# Patient Record
Sex: Male | Born: 1935
Health system: Southern US, Community
[De-identification: ages and names within clinical notes are randomized; demographics above are authoritative.]

## PROBLEM LIST (undated history)

## (undated) DIAGNOSIS — K922 Gastrointestinal hemorrhage, unspecified: Secondary | ICD-10-CM

## (undated) DIAGNOSIS — F0390 Unspecified dementia without behavioral disturbance: Secondary | ICD-10-CM

## (undated) DIAGNOSIS — I10 Essential (primary) hypertension: Secondary | ICD-10-CM

## (undated) DIAGNOSIS — E7800 Pure hypercholesterolemia, unspecified: Secondary | ICD-10-CM

## (undated) DIAGNOSIS — K5790 Diverticulosis of intestine, part unspecified, without perforation or abscess without bleeding: Secondary | ICD-10-CM

## (undated) DIAGNOSIS — I4821 Permanent atrial fibrillation: Secondary | ICD-10-CM

## (undated) DIAGNOSIS — I2510 Atherosclerotic heart disease of native coronary artery without angina pectoris: Secondary | ICD-10-CM

## (undated) HISTORY — DX: Permanent atrial fibrillation: I48.21

## (undated) HISTORY — PX: APPENDECTOMY: SHX54

## (undated) HISTORY — DX: Atherosclerotic heart disease of native coronary artery without angina pectoris: I25.10

## (undated) HISTORY — PX: INSERT / REPLACE / REMOVE PACEMAKER: SUR710

## (undated) HISTORY — PX: KNEE SURGERY: SHX244

## (undated) HISTORY — PX: OTHER SURGICAL HISTORY: SHX169

## (undated) HISTORY — PX: TONSILLECTOMY: SUR1361

## (undated) HISTORY — PX: PILONIDAL CYST EXCISION: SHX744

## (undated) HISTORY — DX: Essential (primary) hypertension: I10

## (undated) HISTORY — DX: Pure hypercholesterolemia, unspecified: E78.00

## (undated) HISTORY — DX: Unspecified dementia, unspecified severity, without behavioral disturbance, psychotic disturbance, mood disturbance, and anxiety: F03.90

---

## 1983-07-03 HISTORY — PX: OTHER SURGICAL HISTORY: SHX169

## 1998-05-12 ENCOUNTER — Encounter: Payer: Self-pay | Admitting: Cardiology

## 1998-05-12 ENCOUNTER — Ambulatory Visit (HOSPITAL_COMMUNITY): Admission: RE | Admit: 1998-05-12 | Discharge: 1998-05-12 | Payer: Self-pay | Admitting: Cardiology

## 1999-01-25 ENCOUNTER — Encounter: Payer: Self-pay | Admitting: Cardiology

## 1999-01-25 ENCOUNTER — Inpatient Hospital Stay (HOSPITAL_COMMUNITY): Admission: EM | Admit: 1999-01-25 | Discharge: 1999-01-31 | Payer: Self-pay | Admitting: Emergency Medicine

## 1999-04-16 ENCOUNTER — Encounter: Payer: Self-pay | Admitting: *Deleted

## 1999-04-16 ENCOUNTER — Emergency Department (HOSPITAL_COMMUNITY): Admission: EM | Admit: 1999-04-16 | Discharge: 1999-04-16 | Payer: Self-pay | Admitting: Emergency Medicine

## 2000-02-29 ENCOUNTER — Encounter: Payer: Self-pay | Admitting: Cardiovascular Disease

## 2000-02-29 ENCOUNTER — Ambulatory Visit (HOSPITAL_COMMUNITY): Admission: RE | Admit: 2000-02-29 | Discharge: 2000-03-01 | Payer: Self-pay | Admitting: Cardiovascular Disease

## 2000-03-01 ENCOUNTER — Encounter: Payer: Self-pay | Admitting: Cardiovascular Disease

## 2000-11-25 ENCOUNTER — Encounter: Payer: Self-pay | Admitting: Geriatric Medicine

## 2000-11-25 ENCOUNTER — Encounter: Admission: RE | Admit: 2000-11-25 | Discharge: 2000-11-25 | Payer: Self-pay | Admitting: Geriatric Medicine

## 2001-04-17 ENCOUNTER — Encounter: Payer: Self-pay | Admitting: Geriatric Medicine

## 2001-04-17 ENCOUNTER — Encounter: Admission: RE | Admit: 2001-04-17 | Discharge: 2001-04-17 | Payer: Self-pay | Admitting: Geriatric Medicine

## 2001-05-26 ENCOUNTER — Encounter: Admission: RE | Admit: 2001-05-26 | Discharge: 2001-05-26 | Payer: Self-pay | Admitting: Gastroenterology

## 2001-05-26 ENCOUNTER — Encounter: Payer: Self-pay | Admitting: Gastroenterology

## 2001-06-16 ENCOUNTER — Encounter (INDEPENDENT_AMBULATORY_CARE_PROVIDER_SITE_OTHER): Payer: Self-pay | Admitting: *Deleted

## 2001-06-16 ENCOUNTER — Ambulatory Visit (HOSPITAL_COMMUNITY): Admission: RE | Admit: 2001-06-16 | Discharge: 2001-06-16 | Payer: Self-pay | Admitting: Gastroenterology

## 2001-07-29 ENCOUNTER — Ambulatory Visit (HOSPITAL_COMMUNITY): Admission: RE | Admit: 2001-07-29 | Discharge: 2001-07-29 | Payer: Self-pay | Admitting: Geriatric Medicine

## 2001-07-30 ENCOUNTER — Encounter: Admission: RE | Admit: 2001-07-30 | Discharge: 2001-07-30 | Payer: Self-pay | Admitting: *Deleted

## 2001-07-30 ENCOUNTER — Encounter (INDEPENDENT_AMBULATORY_CARE_PROVIDER_SITE_OTHER): Payer: Self-pay | Admitting: Specialist

## 2001-07-30 ENCOUNTER — Inpatient Hospital Stay (HOSPITAL_COMMUNITY): Admission: AD | Admit: 2001-07-30 | Discharge: 2001-08-22 | Payer: Self-pay | Admitting: Internal Medicine

## 2001-07-31 ENCOUNTER — Encounter: Payer: Self-pay | Admitting: Internal Medicine

## 2001-08-02 ENCOUNTER — Encounter: Payer: Self-pay | Admitting: Internal Medicine

## 2001-08-04 ENCOUNTER — Encounter: Payer: Self-pay | Admitting: Internal Medicine

## 2001-08-05 ENCOUNTER — Encounter: Payer: Self-pay | Admitting: Internal Medicine

## 2001-08-05 ENCOUNTER — Encounter: Payer: Self-pay | Admitting: Pulmonary Disease

## 2001-08-06 ENCOUNTER — Encounter: Payer: Self-pay | Admitting: Internal Medicine

## 2001-08-07 ENCOUNTER — Encounter: Payer: Self-pay | Admitting: Pulmonary Disease

## 2001-08-08 ENCOUNTER — Encounter: Payer: Self-pay | Admitting: Pulmonary Disease

## 2001-08-09 ENCOUNTER — Encounter: Payer: Self-pay | Admitting: Pulmonary Disease

## 2001-08-10 ENCOUNTER — Encounter: Payer: Self-pay | Admitting: Pulmonary Disease

## 2001-08-11 ENCOUNTER — Encounter: Payer: Self-pay | Admitting: Pulmonary Disease

## 2001-08-12 ENCOUNTER — Encounter: Payer: Self-pay | Admitting: Pulmonary Disease

## 2001-08-13 ENCOUNTER — Encounter: Payer: Self-pay | Admitting: Pulmonary Disease

## 2001-08-15 ENCOUNTER — Encounter: Payer: Self-pay | Admitting: Cardiology

## 2001-08-18 ENCOUNTER — Encounter: Payer: Self-pay | Admitting: Pulmonary Disease

## 2001-08-22 ENCOUNTER — Inpatient Hospital Stay: Admission: RE | Admit: 2001-08-22 | Discharge: 2001-08-29 | Payer: Self-pay | Admitting: Geriatric Medicine

## 2002-02-26 ENCOUNTER — Encounter: Admission: RE | Admit: 2002-02-26 | Discharge: 2002-02-26 | Payer: Self-pay | Admitting: Urology

## 2002-02-26 ENCOUNTER — Encounter: Payer: Self-pay | Admitting: Urology

## 2002-02-27 ENCOUNTER — Ambulatory Visit (HOSPITAL_BASED_OUTPATIENT_CLINIC_OR_DEPARTMENT_OTHER): Admission: RE | Admit: 2002-02-27 | Discharge: 2002-02-27 | Payer: Self-pay | Admitting: Urology

## 2002-02-27 ENCOUNTER — Encounter: Payer: Self-pay | Admitting: Urology

## 2002-03-12 ENCOUNTER — Ambulatory Visit (HOSPITAL_BASED_OUTPATIENT_CLINIC_OR_DEPARTMENT_OTHER): Admission: RE | Admit: 2002-03-12 | Discharge: 2002-03-12 | Payer: Self-pay | Admitting: Urology

## 2002-03-12 ENCOUNTER — Encounter: Payer: Self-pay | Admitting: Urology

## 2002-03-19 ENCOUNTER — Ambulatory Visit (HOSPITAL_BASED_OUTPATIENT_CLINIC_OR_DEPARTMENT_OTHER): Admission: RE | Admit: 2002-03-19 | Discharge: 2002-03-19 | Payer: Self-pay | Admitting: Urology

## 2002-03-19 ENCOUNTER — Encounter: Payer: Self-pay | Admitting: Urology

## 2002-03-26 ENCOUNTER — Encounter: Payer: Self-pay | Admitting: Urology

## 2002-03-26 ENCOUNTER — Encounter: Admission: RE | Admit: 2002-03-26 | Discharge: 2002-03-26 | Payer: Self-pay | Admitting: Urology

## 2002-04-07 ENCOUNTER — Encounter: Payer: Self-pay | Admitting: Urology

## 2002-04-07 ENCOUNTER — Encounter: Admission: RE | Admit: 2002-04-07 | Discharge: 2002-04-07 | Payer: Self-pay | Admitting: Urology

## 2002-06-08 ENCOUNTER — Encounter: Payer: Self-pay | Admitting: Urology

## 2002-06-08 ENCOUNTER — Encounter: Admission: RE | Admit: 2002-06-08 | Discharge: 2002-06-08 | Payer: Self-pay | Admitting: Urology

## 2003-06-16 ENCOUNTER — Encounter: Admission: RE | Admit: 2003-06-16 | Discharge: 2003-06-16 | Payer: Self-pay | Admitting: Cardiology

## 2003-08-12 ENCOUNTER — Encounter: Admission: RE | Admit: 2003-08-12 | Discharge: 2003-08-12 | Payer: Self-pay | Admitting: Geriatric Medicine

## 2004-01-12 ENCOUNTER — Encounter: Admission: RE | Admit: 2004-01-12 | Discharge: 2004-01-12 | Payer: Self-pay | Admitting: Geriatric Medicine

## 2004-06-02 ENCOUNTER — Emergency Department (HOSPITAL_COMMUNITY): Admission: EM | Admit: 2004-06-02 | Discharge: 2004-06-02 | Payer: Self-pay | Admitting: Emergency Medicine

## 2008-01-14 ENCOUNTER — Encounter: Admission: RE | Admit: 2008-01-14 | Discharge: 2008-01-14 | Payer: Self-pay | Admitting: Cardiology

## 2008-01-21 ENCOUNTER — Ambulatory Visit (HOSPITAL_COMMUNITY): Admission: RE | Admit: 2008-01-21 | Discharge: 2008-01-21 | Payer: Self-pay | Admitting: Cardiology

## 2008-10-25 ENCOUNTER — Encounter: Admission: RE | Admit: 2008-10-25 | Discharge: 2008-10-25 | Payer: Self-pay | Admitting: Cardiology

## 2008-11-03 ENCOUNTER — Ambulatory Visit (HOSPITAL_COMMUNITY): Admission: RE | Admit: 2008-11-03 | Discharge: 2008-11-03 | Payer: Self-pay | Admitting: Cardiology

## 2008-11-03 ENCOUNTER — Ambulatory Visit: Payer: Self-pay | Admitting: Vascular Surgery

## 2008-11-11 ENCOUNTER — Ambulatory Visit: Payer: Self-pay | Admitting: Cardiothoracic Surgery

## 2008-11-16 ENCOUNTER — Ambulatory Visit (HOSPITAL_COMMUNITY): Admission: RE | Admit: 2008-11-16 | Discharge: 2008-11-16 | Payer: Self-pay | Admitting: Cardiothoracic Surgery

## 2008-11-16 ENCOUNTER — Encounter: Payer: Self-pay | Admitting: Cardiothoracic Surgery

## 2008-11-18 ENCOUNTER — Ambulatory Visit (HOSPITAL_COMMUNITY): Admission: RE | Admit: 2008-11-18 | Discharge: 2008-11-18 | Payer: Self-pay | Admitting: Cardiothoracic Surgery

## 2008-11-18 ENCOUNTER — Encounter: Payer: Self-pay | Admitting: Cardiothoracic Surgery

## 2008-11-25 ENCOUNTER — Ambulatory Visit: Payer: Self-pay | Admitting: Cardiothoracic Surgery

## 2008-12-09 ENCOUNTER — Ambulatory Visit: Payer: Self-pay | Admitting: Cardiothoracic Surgery

## 2008-12-15 ENCOUNTER — Inpatient Hospital Stay (HOSPITAL_COMMUNITY): Admission: RE | Admit: 2008-12-15 | Discharge: 2008-12-21 | Payer: Self-pay | Admitting: Cardiothoracic Surgery

## 2008-12-15 ENCOUNTER — Ambulatory Visit: Payer: Self-pay | Admitting: Cardiothoracic Surgery

## 2009-01-07 ENCOUNTER — Ambulatory Visit: Payer: Self-pay | Admitting: Cardiothoracic Surgery

## 2009-01-07 ENCOUNTER — Encounter: Admission: RE | Admit: 2009-01-07 | Discharge: 2009-01-07 | Payer: Self-pay | Admitting: Cardiothoracic Surgery

## 2009-01-07 LAB — PACEMAKER DEVICE OBSERVATION

## 2009-02-03 ENCOUNTER — Encounter (HOSPITAL_COMMUNITY): Admission: RE | Admit: 2009-02-03 | Discharge: 2009-05-04 | Payer: Self-pay | Admitting: Cardiology

## 2009-04-27 ENCOUNTER — Encounter: Admission: RE | Admit: 2009-04-27 | Discharge: 2009-04-27 | Payer: Self-pay | Admitting: Geriatric Medicine

## 2009-05-05 ENCOUNTER — Encounter (HOSPITAL_COMMUNITY): Admission: RE | Admit: 2009-05-05 | Discharge: 2009-06-29 | Payer: Self-pay | Admitting: Cardiology

## 2009-11-21 ENCOUNTER — Inpatient Hospital Stay (HOSPITAL_COMMUNITY): Admission: EM | Admit: 2009-11-21 | Discharge: 2009-11-28 | Payer: Self-pay | Admitting: Emergency Medicine

## 2009-11-23 ENCOUNTER — Encounter (INDEPENDENT_AMBULATORY_CARE_PROVIDER_SITE_OTHER): Payer: Self-pay | Admitting: Internal Medicine

## 2010-01-20 IMAGING — CR DG CHEST 2V
2 series · 2 of 2 positions shown · non-contrast
Comparison: Chest x-ray of 12/19/2008

CLINICAL DATA: Recent CABG, weakness, follow-up

CHEST - 2 VIEW

[w chest pa]
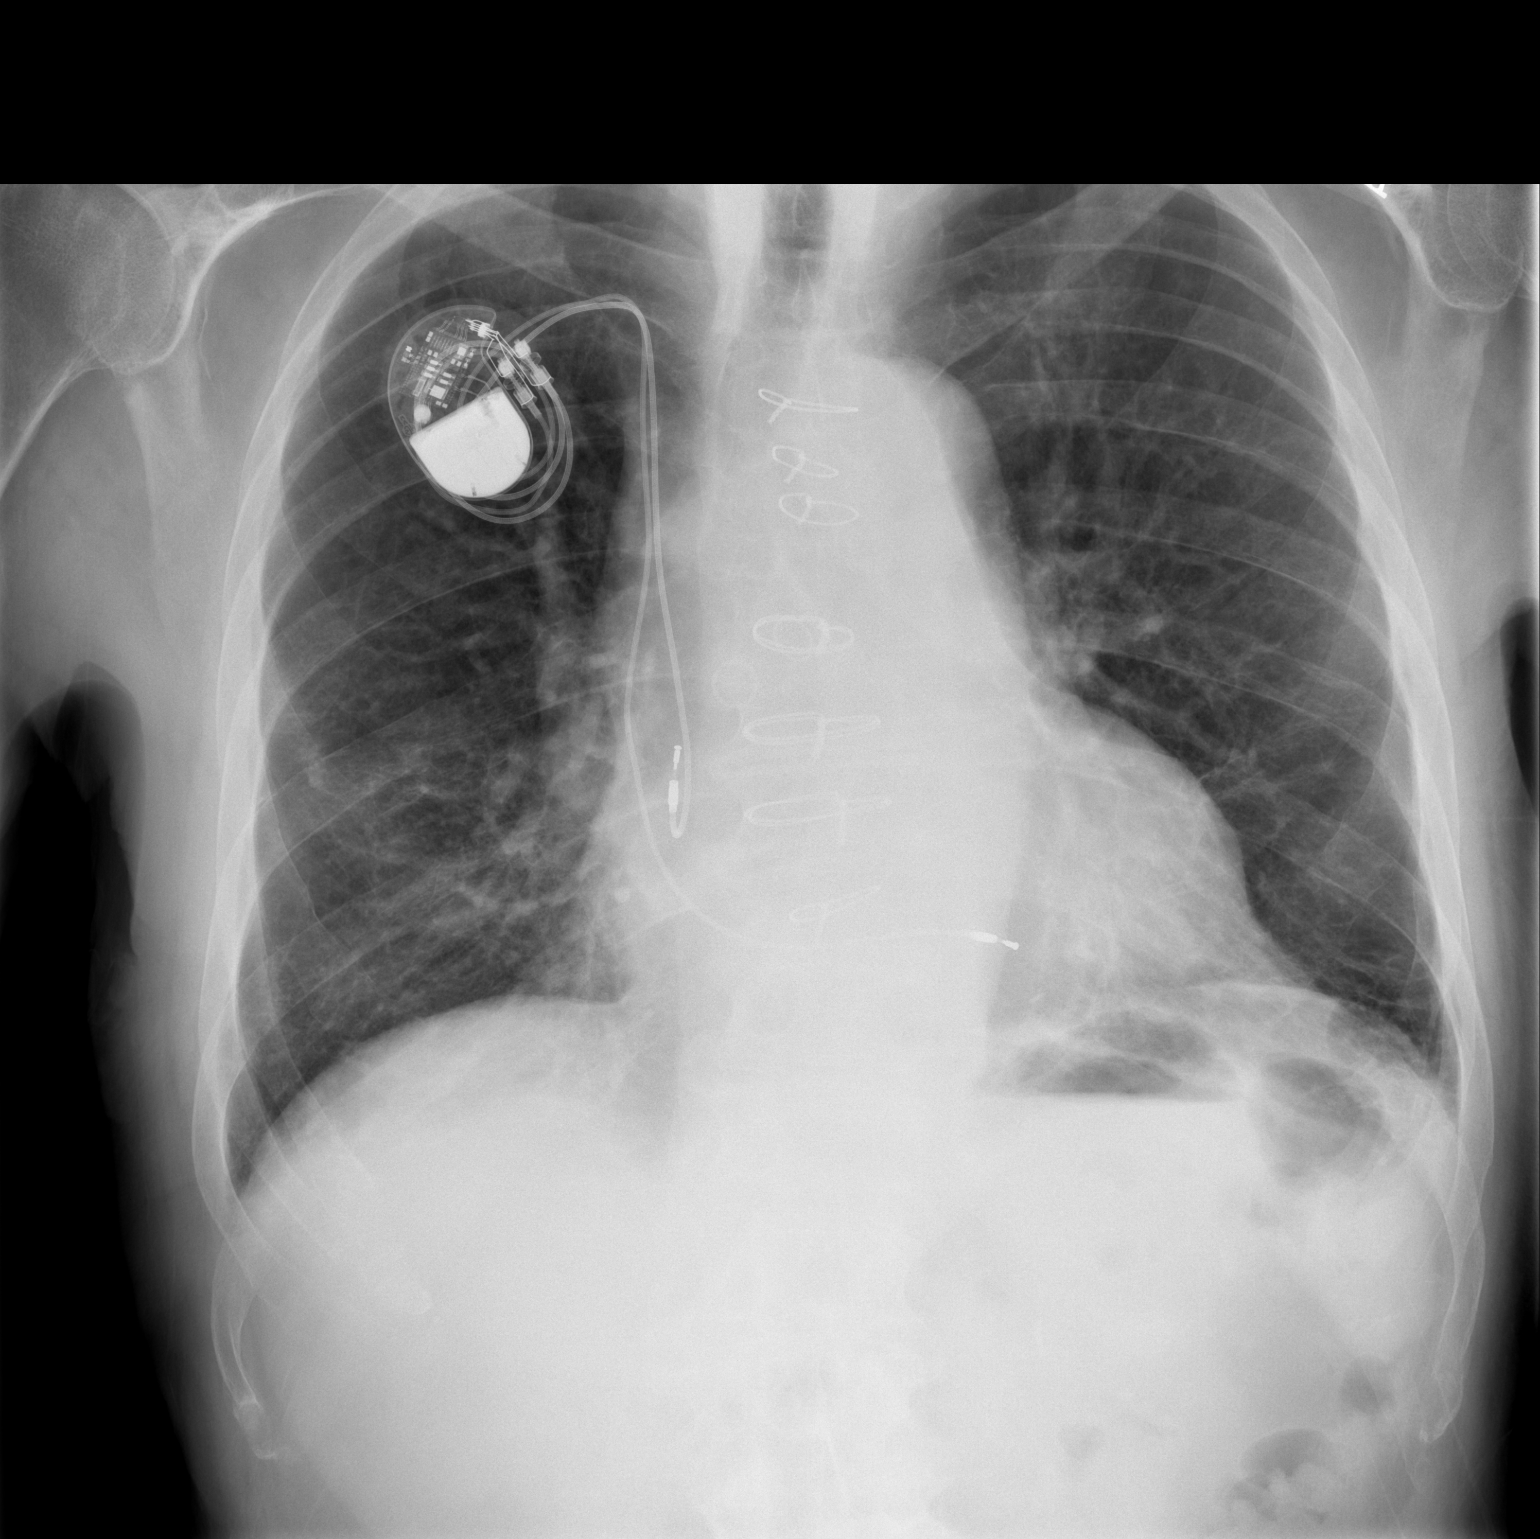

[w chest lat]
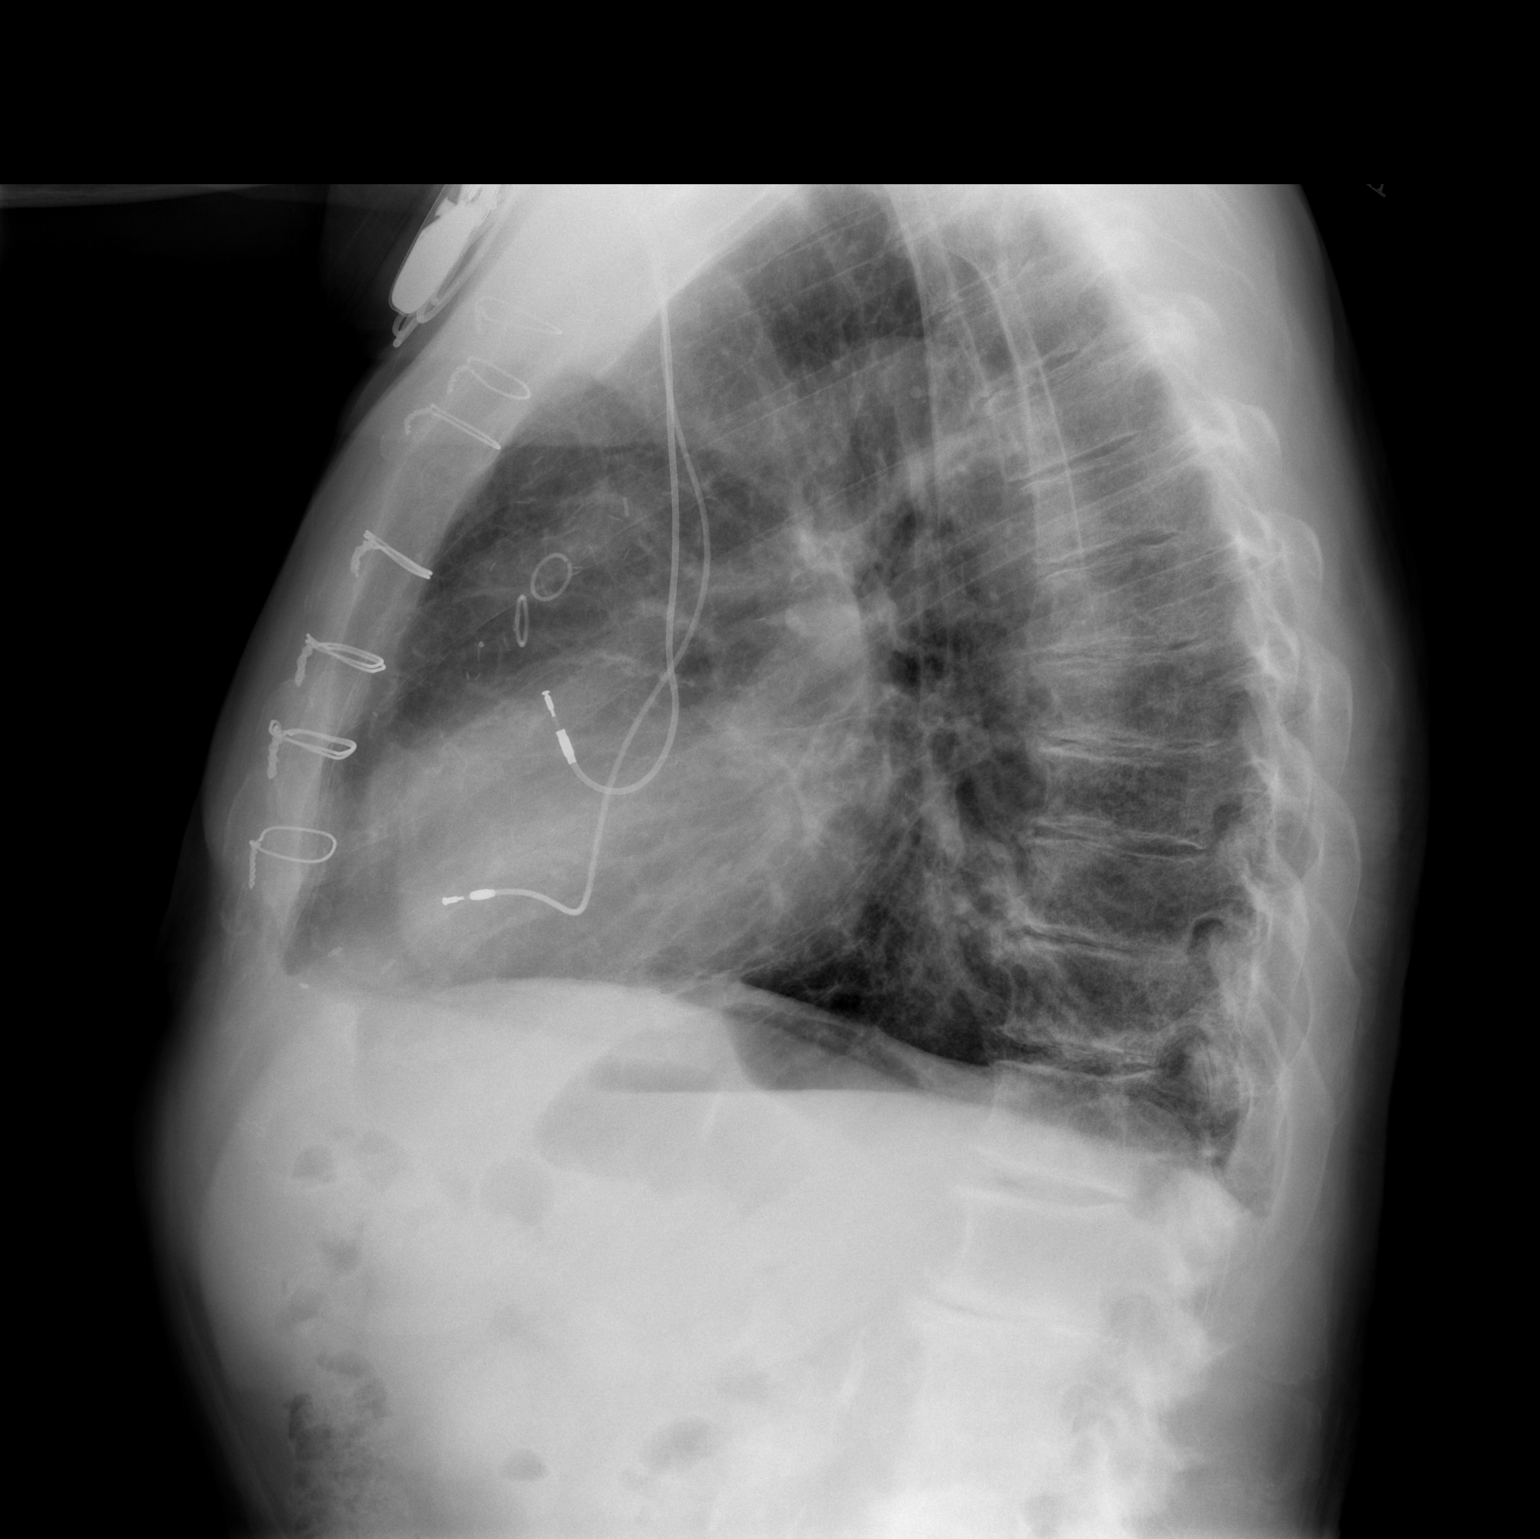

[2 of 2 positions shown; findings below may reference images not displayed]

FINDINGS: Aeration of the lung bases has improved.  The previously
noted small pleural effusions have resolved.  Mild basilar
atelectasis remains.  Cardiomegaly is stable and permanent
pacemaker is unchanged.
IMPRESSION: Improved aeration with resolution of small effusions.

## 2010-07-23 ENCOUNTER — Encounter: Payer: Self-pay | Admitting: Geriatric Medicine

## 2010-09-18 LAB — CBC
HCT: 21.1 % — ABNORMAL LOW (ref 39.0–52.0)
HCT: 22.1 % — ABNORMAL LOW (ref 39.0–52.0)
HCT: 23.4 % — ABNORMAL LOW (ref 39.0–52.0)
HCT: 25.9 % — ABNORMAL LOW (ref 39.0–52.0)
HCT: 27.5 % — ABNORMAL LOW (ref 39.0–52.0)
HCT: 27.6 % — ABNORMAL LOW (ref 39.0–52.0)
HCT: 28.4 % — ABNORMAL LOW (ref 39.0–52.0)
HCT: 28.8 % — ABNORMAL LOW (ref 39.0–52.0)
HCT: 29.3 % — ABNORMAL LOW (ref 39.0–52.0)
HCT: 29.6 % — ABNORMAL LOW (ref 39.0–52.0)
HCT: 30.4 % — ABNORMAL LOW (ref 39.0–52.0)
HCT: 31.4 % — ABNORMAL LOW (ref 39.0–52.0)
HCT: 39.4 % (ref 39.0–52.0)
Hemoglobin: 10 g/dL — ABNORMAL LOW (ref 13.0–17.0)
Hemoglobin: 10 g/dL — ABNORMAL LOW (ref 13.0–17.0)
Hemoglobin: 10.2 g/dL — ABNORMAL LOW (ref 13.0–17.0)
Hemoglobin: 10.6 g/dL — ABNORMAL LOW (ref 13.0–17.0)
Hemoglobin: 13.3 g/dL (ref 13.0–17.0)
Hemoglobin: 7.2 g/dL — ABNORMAL LOW (ref 13.0–17.0)
Hemoglobin: 8.1 g/dL — ABNORMAL LOW (ref 13.0–17.0)
Hemoglobin: 8.5 g/dL — ABNORMAL LOW (ref 13.0–17.0)
Hemoglobin: 9.3 g/dL — ABNORMAL LOW (ref 13.0–17.0)
Hemoglobin: 9.8 g/dL — ABNORMAL LOW (ref 13.0–17.0)
MCHC: 33.8 g/dL (ref 30.0–36.0)
MCHC: 33.8 g/dL (ref 30.0–36.0)
MCHC: 33.9 g/dL (ref 30.0–36.0)
MCHC: 34.2 g/dL (ref 30.0–36.0)
MCHC: 34.6 g/dL (ref 30.0–36.0)
MCV: 95.6 fL (ref 78.0–100.0)
MCV: 95.6 fL (ref 78.0–100.0)
MCV: 95.9 fL (ref 78.0–100.0)
MCV: 96.1 fL (ref 78.0–100.0)
MCV: 96.4 fL (ref 78.0–100.0)
MCV: 96.6 fL (ref 78.0–100.0)
MCV: 96.8 fL (ref 78.0–100.0)
MCV: 97 fL (ref 78.0–100.0)
MCV: 97 fL (ref 78.0–100.0)
MCV: 97.2 fL (ref 78.0–100.0)
MCV: 97.6 fL (ref 78.0–100.0)
Platelets: 121 10*3/uL — ABNORMAL LOW (ref 150–400)
Platelets: 127 10*3/uL — ABNORMAL LOW (ref 150–400)
Platelets: 132 10*3/uL — ABNORMAL LOW (ref 150–400)
Platelets: 140 10*3/uL — ABNORMAL LOW (ref 150–400)
Platelets: 142 10*3/uL — ABNORMAL LOW (ref 150–400)
Platelets: 145 10*3/uL — ABNORMAL LOW (ref 150–400)
Platelets: 146 10*3/uL — ABNORMAL LOW (ref 150–400)
Platelets: 165 10*3/uL (ref 150–400)
Platelets: 177 10*3/uL (ref 150–400)
Platelets: 179 10*3/uL (ref 150–400)
Platelets: 185 10*3/uL (ref 150–400)
Platelets: 212 10*3/uL (ref 150–400)
Platelets: 226 10*3/uL (ref 150–400)
RBC: 2.29 MIL/uL — ABNORMAL LOW (ref 4.22–5.81)
RBC: 2.62 MIL/uL — ABNORMAL LOW (ref 4.22–5.81)
RBC: 2.88 MIL/uL — ABNORMAL LOW (ref 4.22–5.81)
RBC: 2.93 MIL/uL — ABNORMAL LOW (ref 4.22–5.81)
RBC: 3.24 MIL/uL — ABNORMAL LOW (ref 4.22–5.81)
RBC: 4.06 MIL/uL — ABNORMAL LOW (ref 4.22–5.81)
RDW: 14.3 % (ref 11.5–15.5)
RDW: 14.4 % (ref 11.5–15.5)
RDW: 14.5 % (ref 11.5–15.5)
RDW: 14.6 % (ref 11.5–15.5)
RDW: 14.6 % (ref 11.5–15.5)
RDW: 14.7 % (ref 11.5–15.5)
RDW: 14.9 % (ref 11.5–15.5)
RDW: 15.1 % (ref 11.5–15.5)
WBC: 4.4 10*3/uL (ref 4.0–10.5)
WBC: 4.6 10*3/uL (ref 4.0–10.5)
WBC: 5.1 10*3/uL (ref 4.0–10.5)
WBC: 5.1 10*3/uL (ref 4.0–10.5)
WBC: 5.2 10*3/uL (ref 4.0–10.5)
WBC: 5.2 10*3/uL (ref 4.0–10.5)
WBC: 5.7 10*3/uL (ref 4.0–10.5)
WBC: 6 10*3/uL (ref 4.0–10.5)
WBC: 6 10*3/uL (ref 4.0–10.5)
WBC: 6.1 10*3/uL (ref 4.0–10.5)
WBC: 6.2 10*3/uL (ref 4.0–10.5)
WBC: 6.4 10*3/uL (ref 4.0–10.5)

## 2010-09-18 LAB — COMPREHENSIVE METABOLIC PANEL
ALT: 17 U/L (ref 0–53)
ALT: 21 U/L (ref 0–53)
AST: 32 U/L (ref 0–37)
AST: 37 U/L (ref 0–37)
Albumin: 3.6 g/dL (ref 3.5–5.2)
Alkaline Phosphatase: 68 U/L (ref 39–117)
Alkaline Phosphatase: 99 U/L (ref 39–117)
BUN: 15 mg/dL (ref 6–23)
CO2: 25 mEq/L (ref 19–32)
CO2: 27 mEq/L (ref 19–32)
Calcium: 9.2 mg/dL (ref 8.4–10.5)
Chloride: 102 mEq/L (ref 96–112)
Chloride: 109 mEq/L (ref 96–112)
Creatinine, Ser: 1.09 mg/dL (ref 0.4–1.5)
GFR calc Af Amer: >60 mL/min (ref >60)
GFR calc Af Amer: >60 mL/min (ref >60)
GFR calc non Af Amer: >60 mL/min (ref >60)
GFR calc non Af Amer: >60 mL/min (ref >60)
Glucose, Bld: 132 mg/dL — ABNORMAL HIGH (ref 70–99)
Glucose, Bld: 82 mg/dL (ref 70–99)
Potassium: 4.2 mEq/L (ref 3.5–5.1)
Potassium: 4.5 mEq/L (ref 3.5–5.1)
Sodium: 136 mEq/L (ref 135–145)
Sodium: 137 mEq/L (ref 135–145)
Total Bilirubin: 0.6 mg/dL (ref 0.3–1.2)
Total Bilirubin: 0.7 mg/dL (ref 0.3–1.2)
Total Protein: 7.5 g/dL (ref 6.0–8.3)

## 2010-09-18 LAB — BASIC METABOLIC PANEL
BUN: 12 mg/dL (ref 6–23)
Calcium: 8.3 mg/dL — ABNORMAL LOW (ref 8.4–10.5)
Creatinine, Ser: 1.15 mg/dL (ref 0.4–1.5)
GFR calc Af Amer: >60 mL/min (ref >60)
GFR calc non Af Amer: >60 mL/min (ref >60)
GFR calc non Af Amer: >60 mL/min (ref >60)
Glucose, Bld: 113 mg/dL — ABNORMAL HIGH (ref 70–99)
Glucose, Bld: 92 mg/dL (ref 70–99)
Potassium: 3.8 mEq/L (ref 3.5–5.1)
Sodium: 138 mEq/L (ref 135–145)

## 2010-09-18 LAB — CROSSMATCH
ABO/RH(D): O POS
Antibody Screen: NEGATIVE

## 2010-09-18 LAB — PREPARE FRESH FROZEN PLASMA

## 2010-09-18 LAB — DIFFERENTIAL
Basophils Absolute: 0 10*3/uL (ref 0.0–0.1)
Basophils Relative: 0 % (ref 0–1)
Eosinophils Absolute: 0.3 10*3/uL (ref 0.0–0.7)
Eosinophils Relative: 5 % (ref 0–5)
Lymphocytes Relative: 24 % (ref 12–46)
Lymphs Abs: 1.4 10*3/uL (ref 0.7–4.0)
Monocytes Absolute: 0.9 10*3/uL (ref 0.1–1.0)
Monocytes Relative: 15 % — ABNORMAL HIGH (ref 3–12)
Neutro Abs: 3.3 10*3/uL (ref 1.7–7.7)
Neutrophils Relative %: 55 % (ref 43–77)

## 2010-09-18 LAB — PROTIME-INR
INR: 1.18 (ref 0.00–1.49)
INR: 1.5 — ABNORMAL HIGH (ref 0.00–1.49)
INR: 2.43 — ABNORMAL HIGH (ref 0.00–1.49)
Prothrombin Time: 26.2 seconds — ABNORMAL HIGH (ref 11.6–15.2)

## 2010-09-18 LAB — TYPE AND SCREEN: ABO/RH(D): O POS

## 2010-09-18 LAB — PREPARE RBC (CROSSMATCH)

## 2010-09-18 LAB — URINALYSIS, ROUTINE W REFLEX MICROSCOPIC
Bilirubin Urine: NEGATIVE
Ketones, ur: NEGATIVE mg/dL
Nitrite: NEGATIVE
Protein, ur: NEGATIVE mg/dL
Specific Gravity, Urine: 1.017 (ref 1.005–1.030)
Urobilinogen, UA: 0.2 mg/dL (ref 0.0–1.0)

## 2010-09-18 LAB — CARDIAC PANEL(CRET KIN+CKTOT+MB+TROPI): Troponin I: 0.02 ng/mL (ref 0.00–0.06)

## 2010-09-18 LAB — MRSA PCR SCREENING: MRSA by PCR: NEGATIVE

## 2010-09-18 LAB — URINE MICROSCOPIC-ADD ON

## 2010-09-18 LAB — HEMOCCULT GUIAC POC 1CARD (OFFICE): Fecal Occult Bld: POSITIVE

## 2010-10-09 LAB — POCT I-STAT 3, ART BLOOD GAS (G3+)
Acid-Base Excess: 1 mmol/L (ref 0.0–2.0)
Acid-base deficit: 2 mmol/L (ref 0.0–2.0)
Acid-base deficit: 2 mmol/L (ref 0.0–2.0)
Acid-base deficit: 2 mmol/L (ref 0.0–2.0)
Acid-base deficit: 3 mmol/L — ABNORMAL HIGH (ref 0.0–2.0)
Acid-base deficit: 3 mmol/L — ABNORMAL HIGH (ref 0.0–2.0)
Bicarbonate: 22.1 mEq/L (ref 20.0–24.0)
Bicarbonate: 22.1 mEq/L (ref 20.0–24.0)
Bicarbonate: 22.9 mEq/L (ref 20.0–24.0)
Bicarbonate: 23.1 mEq/L (ref 20.0–24.0)
Bicarbonate: 23.2 mEq/L (ref 20.0–24.0)
Bicarbonate: 25.6 mEq/L — ABNORMAL HIGH (ref 20.0–24.0)
O2 Saturation: 100 %
O2 Saturation: 88 %
O2 Saturation: 90 %
O2 Saturation: 92 %
O2 Saturation: 93 %
O2 Saturation: 96 %
Patient temperature: 36.7
Patient temperature: 36.9
Patient temperature: 37.1
Patient temperature: 37.4
Patient temperature: 98.6
TCO2: 23 mmol/L (ref 0–100)
TCO2: 23 mmol/L (ref 0–100)
TCO2: 24 mmol/L (ref 0–100)
TCO2: 24 mmol/L (ref 0–100)
TCO2: 25 mmol/L (ref 0–100)
TCO2: 27 mmol/L (ref 0–100)
pCO2 arterial: 39.4 mmHg (ref 35.0–45.0)
pCO2 arterial: 39.7 mmHg (ref 35.0–45.0)
pCO2 arterial: 39.9 mmHg (ref 35.0–45.0)
pCO2 arterial: 40.6 mmHg (ref 35.0–45.0)
pCO2 arterial: 41.3 mmHg (ref 35.0–45.0)
pCO2 arterial: 42.8 mmHg (ref 35.0–45.0)
pH, Arterial: 7.338 — ABNORMAL LOW (ref 7.350–7.450)
pH, Arterial: 7.343 — ABNORMAL LOW (ref 7.350–7.450)
pH, Arterial: 7.352 (ref 7.350–7.450)
pH, Arterial: 7.371 (ref 7.350–7.450)
pH, Arterial: 7.371 (ref 7.350–7.450)
pH, Arterial: 7.408 (ref 7.350–7.450)
pO2, Arterial: 326 mmHg — ABNORMAL HIGH (ref 80.0–100.0)
pO2, Arterial: 60 mmHg — ABNORMAL LOW (ref 80.0–100.0)
pO2, Arterial: 61 mmHg — ABNORMAL LOW (ref 80.0–100.0)
pO2, Arterial: 65 mmHg — ABNORMAL LOW (ref 80.0–100.0)
pO2, Arterial: 70 mmHg — ABNORMAL LOW (ref 80.0–100.0)
pO2, Arterial: 86 mmHg (ref 80.0–100.0)

## 2010-10-09 LAB — PROTIME-INR
INR: 1.3 (ref 0.00–1.49)
INR: 1.6 — ABNORMAL HIGH (ref 0.00–1.49)
INR: 1.6 — ABNORMAL HIGH (ref 0.00–1.49)
INR: 1.6 — ABNORMAL HIGH (ref 0.00–1.49)
INR: 1.8 — ABNORMAL HIGH (ref 0.00–1.49)
INR: 1.1 (ref 0.00–1.49)
Prothrombin Time: 16.1 seconds — ABNORMAL HIGH (ref 11.6–15.2)
Prothrombin Time: 19.3 seconds — ABNORMAL HIGH (ref 11.6–15.2)
Prothrombin Time: 19.8 seconds — ABNORMAL HIGH (ref 11.6–15.2)
Prothrombin Time: 20 seconds — ABNORMAL HIGH (ref 11.6–15.2)
Prothrombin Time: 22 seconds — ABNORMAL HIGH (ref 11.6–15.2)
Prothrombin Time: 15 seconds (ref 11.6–15.2)

## 2010-10-09 LAB — CBC
HCT: 28.6 % — ABNORMAL LOW (ref 39.0–52.0)
HCT: 28.6 % — ABNORMAL LOW (ref 39.0–52.0)
HCT: 30.1 % — ABNORMAL LOW (ref 39.0–52.0)
HCT: 31.5 % — ABNORMAL LOW (ref 39.0–52.0)
HCT: 33.6 % — ABNORMAL LOW (ref 39.0–52.0)
HCT: 34.4 % — ABNORMAL LOW (ref 39.0–52.0)
HCT: 35.2 % — ABNORMAL LOW (ref 39.0–52.0)
HCT: 35.2 % — ABNORMAL LOW (ref 39.0–52.0)
HCT: 37.2 % — ABNORMAL LOW (ref 39.0–52.0)
HCT: 42.6 % (ref 39.0–52.0)
Hemoglobin: 10.2 g/dL — ABNORMAL LOW (ref 13.0–17.0)
Hemoglobin: 10.7 g/dL — ABNORMAL LOW (ref 13.0–17.0)
Hemoglobin: 11.2 g/dL — ABNORMAL LOW (ref 13.0–17.0)
Hemoglobin: 11.6 g/dL — ABNORMAL LOW (ref 13.0–17.0)
Hemoglobin: 11.6 g/dL — ABNORMAL LOW (ref 13.0–17.0)
Hemoglobin: 12 g/dL — ABNORMAL LOW (ref 13.0–17.0)
Hemoglobin: 12.4 g/dL — ABNORMAL LOW (ref 13.0–17.0)
Hemoglobin: 9.7 g/dL — ABNORMAL LOW (ref 13.0–17.0)
Hemoglobin: 9.7 g/dL — ABNORMAL LOW (ref 13.0–17.0)
Hemoglobin: 14.3 g/dL (ref 13.0–17.0)
MCHC: 32.9 g/dL (ref 30.0–36.0)
MCHC: 33.3 g/dL (ref 30.0–36.0)
MCHC: 33.4 g/dL (ref 30.0–36.0)
MCHC: 33.8 g/dL (ref 30.0–36.0)
MCHC: 33.8 g/dL (ref 30.0–36.0)
MCHC: 33.9 g/dL (ref 30.0–36.0)
MCHC: 33.9 g/dL (ref 30.0–36.0)
MCHC: 33.9 g/dL (ref 30.0–36.0)
MCHC: 34 g/dL (ref 30.0–36.0)
MCHC: 33.6 g/dL (ref 30.0–36.0)
MCV: 95.7 fL (ref 78.0–100.0)
MCV: 96.2 fL (ref 78.0–100.0)
MCV: 96.4 fL (ref 78.0–100.0)
MCV: 96.4 fL (ref 78.0–100.0)
MCV: 96.5 fL (ref 78.0–100.0)
MCV: 96.6 fL (ref 78.0–100.0)
MCV: 96.7 fL (ref 78.0–100.0)
MCV: 96.9 fL (ref 78.0–100.0)
MCV: 97 fL (ref 78.0–100.0)
MCV: 95.3 fL (ref 78.0–100.0)
Platelets: 108 10*3/uL — ABNORMAL LOW (ref 150–400)
Platelets: 112 10*3/uL — ABNORMAL LOW (ref 150–400)
Platelets: 118 10*3/uL — ABNORMAL LOW (ref 150–400)
Platelets: 119 10*3/uL — ABNORMAL LOW (ref 150–400)
Platelets: 130 10*3/uL — ABNORMAL LOW (ref 150–400)
Platelets: 134 10*3/uL — ABNORMAL LOW (ref 150–400)
Platelets: 164 10*3/uL (ref 150–400)
Platelets: 197 10*3/uL (ref 150–400)
Platelets: 91 10*3/uL — ABNORMAL LOW (ref 150–400)
Platelets: 198 10*3/uL (ref 150–400)
RBC: 2.96 MIL/uL — ABNORMAL LOW (ref 4.22–5.81)
RBC: 2.97 MIL/uL — ABNORMAL LOW (ref 4.22–5.81)
RBC: 3.12 MIL/uL — ABNORMAL LOW (ref 4.22–5.81)
RBC: 3.27 MIL/uL — ABNORMAL LOW (ref 4.22–5.81)
RBC: 3.47 MIL/uL — ABNORMAL LOW (ref 4.22–5.81)
RBC: 3.57 MIL/uL — ABNORMAL LOW (ref 4.22–5.81)
RBC: 3.63 MIL/uL — ABNORMAL LOW (ref 4.22–5.81)
RBC: 3.64 MIL/uL — ABNORMAL LOW (ref 4.22–5.81)
RBC: 3.88 MIL/uL — ABNORMAL LOW (ref 4.22–5.81)
RBC: 4.47 MIL/uL (ref 4.22–5.81)
RDW: 13.8 % (ref 11.5–15.5)
RDW: 13.9 % (ref 11.5–15.5)
RDW: 14.1 % (ref 11.5–15.5)
RDW: 14.2 % (ref 11.5–15.5)
RDW: 14.2 % (ref 11.5–15.5)
RDW: 14.2 % (ref 11.5–15.5)
RDW: 14.2 % (ref 11.5–15.5)
RDW: 14.3 % (ref 11.5–15.5)
RDW: 14.4 % (ref 11.5–15.5)
RDW: 14 % (ref 11.5–15.5)
WBC: 10.6 10*3/uL — ABNORMAL HIGH (ref 4.0–10.5)
WBC: 11.1 10*3/uL — ABNORMAL HIGH (ref 4.0–10.5)
WBC: 12.7 10*3/uL — ABNORMAL HIGH (ref 4.0–10.5)
WBC: 7.2 10*3/uL (ref 4.0–10.5)
WBC: 7.4 10*3/uL (ref 4.0–10.5)
WBC: 7.9 10*3/uL (ref 4.0–10.5)
WBC: 8.8 10*3/uL (ref 4.0–10.5)
WBC: 9 10*3/uL (ref 4.0–10.5)
WBC: 9.3 10*3/uL (ref 4.0–10.5)
WBC: 5.7 10*3/uL (ref 4.0–10.5)

## 2010-10-09 LAB — BASIC METABOLIC PANEL
BUN: 10 mg/dL (ref 6–23)
BUN: 13 mg/dL (ref 6–23)
BUN: 15 mg/dL (ref 6–23)
BUN: 17 mg/dL (ref 6–23)
BUN: 18 mg/dL (ref 6–23)
BUN: 22 mg/dL (ref 6–23)
BUN: 24 mg/dL — ABNORMAL HIGH (ref 6–23)
CO2: 23 mEq/L (ref 19–32)
CO2: 25 mEq/L (ref 19–32)
CO2: 26 mEq/L (ref 19–32)
CO2: 27 mEq/L (ref 19–32)
CO2: 29 mEq/L (ref 19–32)
CO2: 30 mEq/L (ref 19–32)
CO2: 32 mEq/L (ref 19–32)
Calcium: 7.8 mg/dL — ABNORMAL LOW (ref 8.4–10.5)
Calcium: 8.2 mg/dL — ABNORMAL LOW (ref 8.4–10.5)
Calcium: 8.3 mg/dL — ABNORMAL LOW (ref 8.4–10.5)
Calcium: 8.4 mg/dL (ref 8.4–10.5)
Calcium: 8.5 mg/dL (ref 8.4–10.5)
Calcium: 8.6 mg/dL (ref 8.4–10.5)
Calcium: 8.9 mg/dL (ref 8.4–10.5)
Chloride: 100 mEq/L (ref 96–112)
Chloride: 101 mEq/L (ref 96–112)
Chloride: 102 mEq/L (ref 96–112)
Chloride: 102 mEq/L (ref 96–112)
Chloride: 103 mEq/L (ref 96–112)
Chloride: 105 mEq/L (ref 96–112)
Chloride: 99 mEq/L (ref 96–112)
Creatinine, Ser: 0.96 mg/dL (ref 0.4–1.5)
Creatinine, Ser: 1.04 mg/dL (ref 0.4–1.5)
Creatinine, Ser: 1.08 mg/dL (ref 0.4–1.5)
Creatinine, Ser: 1.1 mg/dL (ref 0.4–1.5)
Creatinine, Ser: 1.11 mg/dL (ref 0.4–1.5)
Creatinine, Ser: 1.37 mg/dL (ref 0.4–1.5)
Creatinine, Ser: 1.41 mg/dL (ref 0.4–1.5)
GFR calc Af Amer: 58 mL/min — ABNORMAL LOW (ref >60)
GFR calc Af Amer: >60 mL/min (ref >60)
GFR calc Af Amer: >60 mL/min (ref >60)
GFR calc Af Amer: >60 mL/min (ref >60)
GFR calc Af Amer: >60 mL/min (ref >60)
GFR calc Af Amer: >60 mL/min (ref >60)
GFR calc Af Amer: >60 mL/min (ref >60)
GFR calc non Af Amer: 48 mL/min — ABNORMAL LOW (ref >60)
GFR calc non Af Amer: 50 mL/min — ABNORMAL LOW (ref >60)
GFR calc non Af Amer: >60 mL/min (ref >60)
GFR calc non Af Amer: >60 mL/min (ref >60)
GFR calc non Af Amer: >60 mL/min (ref >60)
GFR calc non Af Amer: >60 mL/min (ref >60)
GFR calc non Af Amer: >60 mL/min (ref >60)
Glucose, Bld: 101 mg/dL — ABNORMAL HIGH (ref 70–99)
Glucose, Bld: 102 mg/dL — ABNORMAL HIGH (ref 70–99)
Glucose, Bld: 126 mg/dL — ABNORMAL HIGH (ref 70–99)
Glucose, Bld: 128 mg/dL — ABNORMAL HIGH (ref 70–99)
Glucose, Bld: 160 mg/dL — ABNORMAL HIGH (ref 70–99)
Glucose, Bld: 96 mg/dL (ref 70–99)
Glucose, Bld: 98 mg/dL (ref 70–99)
Potassium: 3.7 mEq/L (ref 3.5–5.1)
Potassium: 3.9 mEq/L (ref 3.5–5.1)
Potassium: 3.9 mEq/L (ref 3.5–5.1)
Potassium: 3.9 mEq/L (ref 3.5–5.1)
Potassium: 4.3 mEq/L (ref 3.5–5.1)
Potassium: 4.4 mEq/L (ref 3.5–5.1)
Potassium: 4.4 mEq/L (ref 3.5–5.1)
Sodium: 134 mEq/L — ABNORMAL LOW (ref 135–145)
Sodium: 134 mEq/L — ABNORMAL LOW (ref 135–145)
Sodium: 134 mEq/L — ABNORMAL LOW (ref 135–145)
Sodium: 135 mEq/L (ref 135–145)
Sodium: 135 mEq/L (ref 135–145)
Sodium: 138 mEq/L (ref 135–145)
Sodium: 141 mEq/L (ref 135–145)

## 2010-10-09 LAB — CREATININE, SERUM
Creatinine, Ser: 0.9 mg/dL (ref 0.4–1.5)
GFR calc Af Amer: >60 mL/min (ref >60)
GFR calc non Af Amer: >60 mL/min (ref >60)

## 2010-10-09 LAB — POCT I-STAT 4, (NA,K, GLUC, HGB,HCT)
Glucose, Bld: 101 mg/dL — ABNORMAL HIGH (ref 70–99)
Glucose, Bld: 110 mg/dL — ABNORMAL HIGH (ref 70–99)
Glucose, Bld: 113 mg/dL — ABNORMAL HIGH (ref 70–99)
Glucose, Bld: 114 mg/dL — ABNORMAL HIGH (ref 70–99)
Glucose, Bld: 122 mg/dL — ABNORMAL HIGH (ref 70–99)
Glucose, Bld: 87 mg/dL (ref 70–99)
Glucose, Bld: 95 mg/dL (ref 70–99)
HCT: 30 % — ABNORMAL LOW (ref 39.0–52.0)
HCT: 32 % — ABNORMAL LOW (ref 39.0–52.0)
HCT: 33 % — ABNORMAL LOW (ref 39.0–52.0)
HCT: 33 % — ABNORMAL LOW (ref 39.0–52.0)
HCT: 38 % — ABNORMAL LOW (ref 39.0–52.0)
HCT: 41 % (ref 39.0–52.0)
HCT: 41 % (ref 39.0–52.0)
Hemoglobin: 10.2 g/dL — ABNORMAL LOW (ref 13.0–17.0)
Hemoglobin: 10.9 g/dL — ABNORMAL LOW (ref 13.0–17.0)
Hemoglobin: 11.2 g/dL — ABNORMAL LOW (ref 13.0–17.0)
Hemoglobin: 11.2 g/dL — ABNORMAL LOW (ref 13.0–17.0)
Hemoglobin: 12.9 g/dL — ABNORMAL LOW (ref 13.0–17.0)
Hemoglobin: 13.9 g/dL (ref 13.0–17.0)
Hemoglobin: 13.9 g/dL (ref 13.0–17.0)
Potassium: 3.4 mEq/L — ABNORMAL LOW (ref 3.5–5.1)
Potassium: 3.5 mEq/L (ref 3.5–5.1)
Potassium: 3.9 mEq/L (ref 3.5–5.1)
Potassium: 4 mEq/L (ref 3.5–5.1)
Potassium: 4.1 mEq/L (ref 3.5–5.1)
Potassium: 4.3 mEq/L (ref 3.5–5.1)
Potassium: 4.4 mEq/L (ref 3.5–5.1)
Sodium: 134 mEq/L — ABNORMAL LOW (ref 135–145)
Sodium: 136 mEq/L (ref 135–145)
Sodium: 137 mEq/L (ref 135–145)
Sodium: 137 mEq/L (ref 135–145)
Sodium: 138 mEq/L (ref 135–145)
Sodium: 139 mEq/L (ref 135–145)
Sodium: 139 mEq/L (ref 135–145)

## 2010-10-09 LAB — GLUCOSE, CAPILLARY
Glucose-Capillary: 101 mg/dL — ABNORMAL HIGH (ref 70–99)
Glucose-Capillary: 107 mg/dL — ABNORMAL HIGH (ref 70–99)
Glucose-Capillary: 108 mg/dL — ABNORMAL HIGH (ref 70–99)
Glucose-Capillary: 109 mg/dL — ABNORMAL HIGH (ref 70–99)
Glucose-Capillary: 112 mg/dL — ABNORMAL HIGH (ref 70–99)
Glucose-Capillary: 115 mg/dL — ABNORMAL HIGH (ref 70–99)
Glucose-Capillary: 115 mg/dL — ABNORMAL HIGH (ref 70–99)
Glucose-Capillary: 115 mg/dL — ABNORMAL HIGH (ref 70–99)
Glucose-Capillary: 118 mg/dL — ABNORMAL HIGH (ref 70–99)
Glucose-Capillary: 118 mg/dL — ABNORMAL HIGH (ref 70–99)
Glucose-Capillary: 121 mg/dL — ABNORMAL HIGH (ref 70–99)
Glucose-Capillary: 125 mg/dL — ABNORMAL HIGH (ref 70–99)
Glucose-Capillary: 128 mg/dL — ABNORMAL HIGH (ref 70–99)
Glucose-Capillary: 132 mg/dL — ABNORMAL HIGH (ref 70–99)
Glucose-Capillary: 134 mg/dL — ABNORMAL HIGH (ref 70–99)
Glucose-Capillary: 134 mg/dL — ABNORMAL HIGH (ref 70–99)
Glucose-Capillary: 140 mg/dL — ABNORMAL HIGH (ref 70–99)
Glucose-Capillary: 140 mg/dL — ABNORMAL HIGH (ref 70–99)
Glucose-Capillary: 142 mg/dL — ABNORMAL HIGH (ref 70–99)
Glucose-Capillary: 144 mg/dL — ABNORMAL HIGH (ref 70–99)
Glucose-Capillary: 149 mg/dL — ABNORMAL HIGH (ref 70–99)
Glucose-Capillary: 150 mg/dL — ABNORMAL HIGH (ref 70–99)
Glucose-Capillary: 167 mg/dL — ABNORMAL HIGH (ref 70–99)
Glucose-Capillary: 57 mg/dL — ABNORMAL LOW (ref 70–99)
Glucose-Capillary: 71 mg/dL (ref 70–99)
Glucose-Capillary: 94 mg/dL (ref 70–99)
Glucose-Capillary: 94 mg/dL (ref 70–99)
Glucose-Capillary: 95 mg/dL (ref 70–99)
Glucose-Capillary: 97 mg/dL (ref 70–99)
Glucose-Capillary: 99 mg/dL (ref 70–99)

## 2010-10-09 LAB — COMPREHENSIVE METABOLIC PANEL
ALT: 17 U/L (ref 0–53)
AST: 33 U/L (ref 0–37)
Albumin: 3.8 g/dL (ref 3.5–5.2)
Alkaline Phosphatase: 70 U/L (ref 39–117)
BUN: 19 mg/dL (ref 6–23)
CO2: 23 mEq/L (ref 19–32)
Calcium: 9.4 mg/dL (ref 8.4–10.5)
Chloride: 106 mEq/L (ref 96–112)
Creatinine, Ser: 1.09 mg/dL (ref 0.4–1.5)
GFR calc Af Amer: >60 mL/min (ref >60)
GFR calc non Af Amer: >60 mL/min (ref >60)
Glucose, Bld: 95 mg/dL (ref 70–99)
Potassium: 5.1 mEq/L (ref 3.5–5.1)
Sodium: 136 mEq/L (ref 135–145)
Total Bilirubin: 0.9 mg/dL (ref 0.3–1.2)
Total Protein: 7.1 g/dL (ref 6.0–8.3)

## 2010-10-09 LAB — CK TOTAL AND CKMB (NOT AT ARMC)
CK, MB: 24.8 ng/mL — ABNORMAL HIGH (ref 0.3–4.0)
Relative Index: 5.6 — ABNORMAL HIGH (ref 0.0–2.5)
Total CK: 446 U/L — ABNORMAL HIGH (ref 7–232)

## 2010-10-09 LAB — URINALYSIS, ROUTINE W REFLEX MICROSCOPIC
Bilirubin Urine: NEGATIVE
Glucose, UA: NEGATIVE mg/dL
Hgb urine dipstick: NEGATIVE
Ketones, ur: NEGATIVE mg/dL
Nitrite: NEGATIVE
Protein, ur: NEGATIVE mg/dL
Specific Gravity, Urine: 1.024 (ref 1.005–1.030)
Urobilinogen, UA: 1 mg/dL (ref 0.0–1.0)
pH: 6 (ref 5.0–8.0)

## 2010-10-09 LAB — BLOOD GAS, ARTERIAL
Acid-Base Excess: 1.8 mmol/L (ref 0.0–2.0)
Bicarbonate: 25.5 mEq/L — ABNORMAL HIGH (ref 20.0–24.0)
Drawn by: 206361
FIO2: 0.21 %
O2 Saturation: 97.3 %
Patient temperature: 98.6
TCO2: 26.7 mmol/L (ref 0–100)
pCO2 arterial: 37.7 mmHg (ref 35.0–45.0)
pH, Arterial: 7.445 (ref 7.350–7.450)
pO2, Arterial: 83.5 mmHg (ref 80.0–100.0)

## 2010-10-09 LAB — POCT I-STAT, CHEM 8
BUN: 12 mg/dL (ref 6–23)
BUN: 17 mg/dL (ref 6–23)
Calcium, Ion: 1.13 mmol/L (ref 1.12–1.32)
Calcium, Ion: 1.18 mmol/L (ref 1.12–1.32)
Chloride: 101 mEq/L (ref 96–112)
Chloride: 104 mEq/L (ref 96–112)
Creatinine, Ser: 1 mg/dL (ref 0.4–1.5)
Creatinine, Ser: 1.3 mg/dL (ref 0.4–1.5)
Glucose, Bld: 117 mg/dL — ABNORMAL HIGH (ref 70–99)
Glucose, Bld: 148 mg/dL — ABNORMAL HIGH (ref 70–99)
HCT: 37 % — ABNORMAL LOW (ref 39.0–52.0)
HCT: 39 % (ref 39.0–52.0)
Hemoglobin: 12.6 g/dL — ABNORMAL LOW (ref 13.0–17.0)
Hemoglobin: 13.3 g/dL (ref 13.0–17.0)
Potassium: 4.4 mEq/L (ref 3.5–5.1)
Potassium: 4.7 mEq/L (ref 3.5–5.1)
Sodium: 136 mEq/L (ref 135–145)
Sodium: 137 mEq/L (ref 135–145)
TCO2: 23 mmol/L (ref 0–100)
TCO2: 26 mmol/L (ref 0–100)

## 2010-10-09 LAB — APTT
aPTT: 34 seconds (ref 24–37)
aPTT: 29 seconds (ref 24–37)

## 2010-10-09 LAB — HEMOGLOBIN A1C
Hgb A1c MFr Bld: 5.5 % (ref 4.6–6.1)
Mean Plasma Glucose: 111 mg/dL

## 2010-10-09 LAB — HEMOGLOBIN AND HEMATOCRIT, BLOOD
HCT: 30.7 % — ABNORMAL LOW (ref 39.0–52.0)
Hemoglobin: 10.2 g/dL — ABNORMAL LOW (ref 13.0–17.0)

## 2010-10-09 LAB — TYPE AND SCREEN
ABO/RH(D): O POS
Antibody Screen: NEGATIVE

## 2010-10-09 LAB — MAGNESIUM
Magnesium: 2.2 mg/dL (ref 1.5–2.5)
Magnesium: 2.5 mg/dL (ref 1.5–2.5)

## 2010-10-09 LAB — PLATELET COUNT: Platelets: 107 10*3/uL — ABNORMAL LOW (ref 150–400)

## 2010-10-09 LAB — ABO/RH: ABO/RH(D): O POS

## 2010-10-10 LAB — PROTIME-INR
INR: 1.4 (ref 0.00–1.49)
Prothrombin Time: 18 seconds — ABNORMAL HIGH (ref 11.6–15.2)

## 2010-11-14 NOTE — Assessment & Plan Note (Signed)
OFFICE VISIT   [name] [name], [name] [name]  DOB:  1926/06/06                                        January 07, 2009  CHART #:  [id number]   REASON FOR OFFICE VISIT:  Routine followup, status post CABG.   HISTORY OF PRESENTING ILLNESS:  This is a very pleasant 77 year old  Caucasian male with a past medical history of coronary artery disease  (status post PTCA x2 in 1997), chronic atrial fibrillation, remote  tobacco history (quit 30 years ago), who in March 2010 had a nuclear  medicine study.  This showed a 50% ejection fraction, lateral ischemia,  and inferior scarring.  A cardiac catheterization was done which  revealed multivessel coronary artery disease.  The patient underwent  when a CABG x3 (LIMA to LAD, SVG to distal circ, SVG to distal RCA),  left-sided maze, and ligation of left atrial appendage by Dr. [name]  on December 15, 2008.  His postoperative course was fairly uneventful.   The patient's only complaint since having been discharged from the  hospital is feeling fatigued and tired.  He denies any shortness of  breath, chest pain, fever, or chills.   PHYSICAL EXAMINATION:  GENERAL:  This is a pleasant 77 year old  Caucasian male, who is in no acute distress, who is alert, oriented, and  cooperative.  VITAL SIGNS:  BP is 122/75, heart rate 80, respiration 18, and O2 sat is  94% on room air.  CARDIOVASCULAR:  Regular rate and rhythm.  PULMONARY:  Clear to auscultation bilaterally.  No rales, wheezes, or  rhonchi.  ABDOMEN:  Soft and nontender.  Bowel sounds present.  EXTREMITIES:  No cyanosis, clubbing, or edema.  Right lower extremity  wound is well healed.  CHEST:  Sternum is solid.  Incision is clean and dry.  A left-sided  chest tube stitch was removed without difficulty.   Chest x-ray done today showed improvement of lung aeration,  cardiomegaly, and mild basilar atelectasis.   IMPRESSION AND PLAN:  Overall, the patient is surgically stable,  status  post coronary artery bypass graft x3, left-sided maze, ligation of left  atrial appendage.  He was seen by his cardiologist, Dr. Rex Kras earlier  today.  He has been placed on folic acid and iron.  [name]  continues to follow his Coumadin.  The nurse drew the blood this morning  and the PT was 35 and the INR was 3.5.  We are waiting for Dr.  Carlyle Lipa office for further Coumadin instructions.  He will continue  to follow this closely.  The patient was instructed he may begin driving  short distances during the day and over the next couple of weeks of  longer duration.  The patient does not wish to engage in cardiac rehab.  He does walk daily, was encouraged to increase his frequency and  duration as he tolerates over the next couple of weeks.  The patient was  also instructed to continue with sternal precautions (no lifting more  than 10-15 pounds for 3-4 or more weeks).  The patient is going to  continue to be followed by Dr. Rex Kras and he will see Dr. [name] on a  p.r.n. basis.   Lanelle Bal, MD  Electronically Signed   DZ/MEDQ  D:  01/07/2009  T:  01/08/2009  Job:  [id number]   cc:  Jeanella Craze. Little, M.D.  Hal T. Stoneking, M.D.
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
## 2010-11-14 NOTE — Assessment & Plan Note (Signed)
OFFICE VISIT   [name], [name]  DOB:  March 05, 1926                                        Nov 25, 2008  CHART #:  [id number]   HISTORY OF PRESENT ILLNESS:  The patient returns to the office today to  further consideration of bypass surgery.  He was originally seen on May  12 and is an 77 year old male who has denied any chest pain, but had  some increasing fatigue and a stress test was done in March of 2010,  which showed a 50% of ejection fraction and lateral ischemia and  inferior scarring leading to a repeat cardiac catheterization.  When he  was last seen, he had given a history of having significant respiratory  problems and having a period of hospitalization requiring prolonged  ventilation and also that he had bilateral vein strippings before.  At  the time of catheterization, a question of a 30-mm gradient across the  aortic valve was raised.  However, the actual numbers were 143 and 148.  With this discrepancy in the available data to rule out aortic stenosis,  I did send the patient for a repeat echocardiogram that showed no  evidence of aortic valve disease as well as stenosis.  The patient  because of his age, somewhat frail condition and known memory problems,  returned to the office today with his wife and son to further discuss  bypass surgery.  I have told him that we had a 45-minute discussion  about the risks and options involved.  Surgery has been offered to him.  He is at this point unsure if he is willing to undergo it and  considering his overall status.  It seems appropriate that he consider his options.  He wishes to discuss  it with his family and then, we will call back early next week and if he  wishes to proceed, we will plan on making arrangements.  The patient is  on Coumadin, will have to be taken off Coumadin prior to surgery.   PHYSICAL EXAMINATION:  Today, his blood pressure 126/70, pulse is 59,  respiratory rate is 16, O2  sats 95%.  Otherwise, his exam is unchanged.  He has no pedal edema.   PFTs showed adequate pulmonary reserve.  Echo with predicted FEV-1 of  114% and diffusion capacities in the 80% range.  Venous mapping showed  varicosities in the lower calves, but adequate saphenous vein in both  thighs.   At this point, the patient will discuss with his family his options.  If  he wishes to proceed and will call the office back early next week.   Lanelle Bal, MD  Electronically Signed   EG/MEDQ  D:  11/25/2008  T:  11/26/2008  Job:  [id number]   cc:   Jeanella Craze. Little, M.D.

## 2010-11-14 NOTE — Discharge Summary (Signed)
[name] [name], [name] [name] NO.:  [id number]   MEDICAL RECORD NO.:  [id number]          PATIENT TYPE:  INP   LOCATION:  2016                         FACILITY:  [facility]   PHYSICIAN:  Lanelle Bal, MD    DATE OF BIRTH:  05-Dec-1925   DATE OF ADMISSION:  12/15/2008  DATE OF DISCHARGE:  12/21/2008                               DISCHARGE SUMMARY   PRIMARY ADMITTING DIAGNOSIS:  Coronary artery disease.   ADDITIONAL/DISCHARGE DIAGNOSES:  1. Coronary artery disease.  2. Chronic atrial fibrillation, on Coumadin.  3. History of prior coronary artery disease status post angioplasty in      1997.  4. History of respiratory failure 2 years ago thought to be secondary      to amiodarone toxicity requiring at least a week on the ventilator.  5. History of kidney stones.  6. History of dementia.  7. History of permanent pacemaker.   PROCEDURES PERFORMED:  1. Coronary artery bypass grafting x3 (left internal mammary artery to      the left anterior descending, saphenous vein graft to the      circumflex, saphenous vein graft to the right coronary artery).  2. Ligation of left atrial appendage.  3. Left-sided maze procedure.  4. Endovein harvest, right thigh.   HISTORY:  The patient is an 77 year old male with a known history of  coronary artery disease.  He is status post prior angioplasty in 1996.  He recently has had increasing dyspnea on exertion with associated  fatigue but no chest pain.  He had a nuclear medicine study by Dr.  Rex [name] in March 2010 which showed 50% ejection fraction with lateral  ischemia and inferior scarring.  He subsequently underwent cardiac  catheterization in May which showed severe three-vessel coronary artery  disease which was not felt to be amenable to percutaneous intervention.  He was referred as an outpatient consultation to Dr. Lanelle Bal for  consideration of surgical revascularization.  Dr. Servando [name] evaluated the  patient and reviewed  his films and felt that his best option would be to  proceed with CABG at this time.  He explained all risks, benefits, and  alternatives of surgery to the patient and he agreed to proceed.   HOSPITAL COURSE:  Mr. [name] [name] was admitted to [facility] on December 15, 2008 and underwent CABG x3 and maze procedure as described above.  Please see previously dictated operative report for complete details of  surgery.  He tolerated the procedure well and was transferred to the  SICU in stable condition.  He was able to be extubated shortly after  surgery.  He was hemodynamically stable on postop day #1, although he  did require dopamine and phenylephrine drips initially postoperatively.  He had some mild confusion and was kept in the unit.  This was managed  conservatively and resolved without problem.  By postop day #2, he was  off all drips and was able to be transferred to the floor.  He was  restarted on Coumadin and his dose has been titrated upward accordingly.  Overall, his postoperative course has  been uneventful.  He has been  ambulating with cardiac rehab phase I and is progressing well.  He has  remained afebrile and his vital signs have been stable.  His incisions  are all healing well.  He has been mildly volume overloaded but  presently he has been diuresed back down to around his preoperative  weight.  He is in the process of being weaned from supplemental oxygen  and is presently maintaining sats of around 90% on 1-2 L of supplemental  oxygen.  His most recent labs show a hemoglobin of 10.2, hematocrit  30.1, white count 7.9, and platelets 164.  Sodium 141, potassium 3.9,  BUN 17, and creatinine 1.08.  PT 16.1 and INR 1.3.  He will be monitored  over the next 24 hours and hopefully if no acute changes have occurred  and he continues to progress well, he will be ready for discharge home  on December 21, 2008.   DISCHARGE MEDICATIONS:  1. Coumadin, home dose will be determined by  PT and INR drawn on the      date of discharge.  2. Toprol-XL 25 mg daily.  3. Digoxin 0.125 mg daily.  4. Ultram 50-100 mg q.4 h. p.r.n. pain.  5. Lasix 40 mg daily x3 days.  6. Potassium 20 mEq daily x3 days.  7. Omeprazole 20 mg daily.  8. Calcium 500 mg plus D b.i.d.  9. Multivitamin daily.  10.Simvastatin 40 mg at bedtime.  11.Aspirin 81 mg daily.  12.Finasteride 5 mg daily.  13.Zolpidem 5 mg at bedtime.   DISCHARGE INSTRUCTIONS:  He is asked to refrain from driving, heavy  lifting, or strenuous activity.  He may continue ambulating daily and  using his incentive spirometer.  He may shower daily and clean his  incisions with soap and water.  He will continue a low-fat, low-sodium  diet.   DISCHARGE FOLLOWUP:  He will need to have his PT and INR drawn within 48  hours of discharge and results to [facility] for  management of his Coumadin.  He will make an appointment see Dr. Rex [name]  in 2 weeks.  He will then follow up with Dr. Servando [name] on January 06, 2009  with a chest x-ray.  A Home Health nurse and PT has been arranged.  If  he experiences any problems or has questions in the interim, he is asked  to contact our office immediately.      Suzzanne Cloud, P.A.      Lanelle Bal, MD  Electronically Signed    GC/MEDQ  D:  12/20/2008  T:  12/21/2008  Job:  [id number]   cc:   Jeanella Craze. Little, M.D.  Hal T. Stoneking, M.D.

## 2010-11-14 NOTE — Op Note (Signed)
NAMEKESHAWN, DUNAWAY NO.:  [id number]   MEDICAL RECORD NO.:  [id number]          PATIENT TYPE:  INP   LOCATION:  2016                         FACILITY:  [facility]   PHYSICIAN:  Lanelle Bal, MD    DATE OF BIRTH:  1926-04-23   DATE OF PROCEDURE:  12/15/2008  DATE OF DISCHARGE:                               OPERATIVE REPORT   PREOPERATIVE DIAGNOSES:  Coronary occlusive disease and history of  atrial fibrillation.   POSTOPERATIVE DIAGNOSES:  Coronary occlusive disease and history of  atrial fibrillation.   SURGICAL PROCEDURES:  Coronary artery bypass grafting x3 and left-sided  maze and ligation of left atrial appendage with right endovein  harvesting.   SURGEON:  Lanelle Bal, MD   FIRST ASSISTANT:  Lars Pinks, PA   BRIEF HISTORY:  The patient is an 77 year old male with known mild  dementia who presents with increasing anginal symptoms and positive  stress test.  He has been followed by Dr. Rex Kras.  Cardiac  catheterization was performed, which showed significant three-vessel  coronary artery disease with 80% circumflex lesion, subtotal occlusion  of the small first obtuse marginal, 70-80% LAD, and 70-80% right  coronary artery obstruction.  Because of the patient's symptoms and  three-vessel coronary artery disease, coronary artery bypass grafting  was recommended.  The patient previously had a DDD pacemaker placed and  has had history of atrial fibrillation in the past and been maintained  on Coumadin.  After preoperative echocardiogram showed no evidence of  aortic stenosis or mitral regurgitation after several discussions with  the patient and his family, it was decided to proceed with coronary  artery bypass grafting and maze procedure.  The patient was agreeable  and signed informed consent.   DESCRIPTION OF PROCEDURE:  With Swan-Ganz and arterial line monitors in  place, the patient underwent general endotracheal anesthesia without  incidence.  Skin of the chest and legs was prepped with Betadine and  draped in the usual sterile manner.  The patient's underlying rhythm was  paced at 60.  Using the [employer] endovein harvesting system, vein was  harvested from the right thigh and was of good quality and caliber.  Median sternotomy was performed.  Left internal mammary artery was  dissected down as a pedicle graft.  Distal artery was divided, had good  free flow.  Pericardium was opened.  Overall ventricular function  appeared preserved.  The patient was systemically heparinized.  The  ascending aorta was cannulated.  The right atrium was cannulated and  aortic root vent cardioplegia needle was introduced into the ascending  aorta.  The patient was placed on cardiopulmonary bypass 2.4 L per  minute per meter square.  Sites of anastomosis were dissected out of  epicardium to the first obtuse marginal, which was subtotally occluded  with very small vessel diffusely diseased and now was not large enough  to bypass.  It was decided also to proceed with bipolar modified maze  with left-sided separate lesions from the external surface of the heart.  Red rubber catheter was placed around the right and left pulmonary veins  to assist in placement of the bipolar radiofrequency ablation device.  Aortic crossclamp was applied, 500 mL of cold blood potassium  cardioplegia was administered antegrade.  Bipolar lesions were placed  across the left atrial appendage around the right and left pulmonary  veins.  The left atrial appendage was doubly ligated with heavy silk  ties.  Attention was then turned to the grafts.  The distal circumflex  was opened and admitted a 1.5-mm probe.  Using a running 7-0 Prolene,  distal anastomosis was performed.  The distal right coronary artery was  opened and also was approximately 1.5 mm in size.  Using running 7-0  Prolene, distal anastomosis was performed.  Attention was then turned to  the left  anterior descending coronary artery, which was opened in  between the mid and distal third using running 8-0 Prolene.  Left  internal mammary artery was anastomosed to left anterior descending  coronary artery.  With crossclamp still in, intermittent cardioplegia  was administered down the vein grafts intermittently with the aortic  root crossclamp still in place.  Two punch aortotomies were performed.  Each of the 2 vein grafts were anastomosed to the ascending aorta.  Air  was evacuated from the grafts and the aortic crossclamp was removed.  The patient spontaneously converted to a paced rhythm set at 60 with his  internal pacer.  Sites of anastomosis were inspected free of bleeding.  External temporary atrial and ventricular pacing wires were applied.  The patient was then atrially paced at 90.  He was started on milrinone  and dopamine fusions.  He was then ventilated and weaned from  cardiopulmonary bypass.  Soon after bypass, there were area of bleeding  around the right pulmonary vein.  He was placed back on bypass to a  decompressed heart to control this bleeding, which was done  satisfactorily with pledget stitches.  He was then again weaned and  separated from bypass without difficulty with a total pump time of 145  minutes.  He was decannulated in usual fashion.  Protamine sulfate was  administered.  With operative field hemostatic, a left pleural tube and  a Blake mediastinal drain were left in place.  Pericardium was loosely  reapproximated.  Sternum was closed with #6 stainless steel wire.  Fascia closed with interrupted 0-Vicryl, running 3-0 Vicryl, and  subcutaneous tissue 4-0 subcuticular stitch in skin edges.  Dry  dressings were applied.  Sponge and needle count was reported as correct  at the completion of procedure.  The patient tolerated the procedure  without obvious complications and was transferred to surgical intensive  care unit for further postoperative  care.      Lanelle Bal, MD  Electronically Signed     EG/MEDQ  D:  12/20/2008  T:  12/21/2008  Job:  [id number]   cc:   Jeanella Craze. Little, M.D.  Hal T. Stoneking, M.D.

## 2010-11-14 NOTE — Cardiovascular Report (Signed)
NAMETRENITY, BASURTO NO.:  [id number]   MEDICAL RECORD NO.:  [id number]          PATIENT TYPE:  OIB   LOCATION:  2899                         FACILITY:  [facility]   PHYSICIAN:  Jeanella Craze. Little, M.D. DATE OF BIRTH:  May 09, 1926   DATE OF PROCEDURE:  DATE OF DISCHARGE:                            CARDIAC CATHETERIZATION   INDICATIONS FOR TEST:  This 77 year old male is asymptomatic.  In 1997  he had an acute lateral myocardial infarction with angioplasty only.  He  has had no recurrent symptoms, but had a routine followup nuclear study  on September 28, 2008 that showed a 50% ejection fraction and significant  lateral ischemia and inferior scarring.  He was actually asymptomatic  when he had his initial infarct until the time of the infarct.   Because of the abnormal nuclear study, he was brought in for outpatient  cardiac catheterization.   After obtaining informed consent, the patient was prepped and draped in  the usual sterile fashion exposing the right groin.  Following local  anesthetic with 1% Xylocaine, the Seldinger technique was employed and a  5-French introducer sheath was placed in the right femoral artery.  Left  and right coronary arteriography and ventriculography in the RAO  projection and distal aortogram was performed.  In addition to this, the  angiographic visualization of the subclavian internal mammary artery was  performed.   COMPLICATIONS:  None.   EQUIPMENT:  5-French Judkins configuration catheters.   TOTAL CONTRAST:  140 mL.   RESULTS:  1. Hemodynamic monitoring:  Central aortic pressure was 143/68, left      ventricular pressure was 148/16 with a 30-mm aortic valve gradient.  2. Ventriculography.  Ventriculography in the RAO projection done, was      enzymatic.  The ejection fraction was 45%.  There was +1 mitral      regurgitation and the left ventricle was slightly dilated.  3. Distal aortogram.  Distal aortogram done above the level  of the      renal artery showed no evidence of renal artery stenosis, two areas      of small aneurysmal dilatation in the infrarenal area, but above      the bifurcation.  4. Evaluation of the left subclavian/IMA showed the subclavian and      internal mammary artery are to be widely patent.  The IMA was      actually a large vessel.  5. Coronary arteriography:  On fluoroscopy, there was dense      calcification in the left main, LAD, and circumflex and slightly      less but still moderate calcification in the right coronary artery.      a.     Left main normal, it bifurcated.      b.     Circumflex.  The circumflex had an area of proximal       eccentric 80% narrowing.  Distal to this was an area of mild       poststenotic dilatation and then there was a complex lesion,       slightly pass this  at the bifurcation of the first OM vessel.  In       the distal OM which bifurcated was an area of 80% narrowing.  The       second OM had only mild irregularities.  6. LAD.  The LAD extended down and around the apex of the heart.      Proximally, there was a tapering 70% area of narrowing.  The distal      portion of the LAD as it cross the apex of the heart, was an area      of 80% narrowing, but at this level, the vessel was relatively      small, but it did happened ongoing presence on the inferior wall.      The first diagonal was a medium-sized vessel free of disease.  7. Right coronary artery.  This was a relatively small diameter vessel      about 2-1/2 mm.  There was an eccentric mid 70% area of narrowing.      The distal right, the PDA, and the posterior lateral vessels were      free of disease.   In view of the dense calcification and the three-vessel involvement,  this patient would benefit from bypass surgery.  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  He may very well benefit from a maze  procedure  at the same time he has revascularization.  The small  abdominal aortic aneurysms can be followed with serial Doppler studies.           ______________________________  Jeanella Craze Little, M.D.     ABL/MEDQ  D:  11/03/2008  T:  11/04/2008  Job:  [id number]   cc:   Hal T. Stoneking, M.D.  Cath Lab

## 2010-11-14 NOTE — H&P (Signed)
[name] [name], [name] [name] NO.:  [id number]   MEDICAL RECORD NO.:  [id number]          PATIENT TYPE:  INP   LOCATION:  NA                           FACILITY:  [facility]   PHYSICIAN:  Lanelle Bal, MD    DATE OF BIRTH:  [birth year]   DATE OF ADMISSION:  DATE OF DISCHARGE:                              HISTORY & PHYSICAL   FOLLOWUP CARDIOLOGIST:  Jeanella Craze. Little, MD   PRIMARY CARE PHYSICIAN:  Hal T. Trinity, MD   CHIEF COMPLAINT:  Coronary artery occlusive disease.   HISTORY OF PRESENT ILLNESS:  The patient is an 77 year old male with  known coronary artery occlusive disease having had angioplasty in 1996.  He has noted no definite chest pain or acute coronary episodes though he  has had increasing dyspnea on exertion and fatigue with exertion.  In  March 2010, a followup nuclear medicine study was done by Dr. Rex Kras,  which showed a 50% ejection fraction, lateral ischemia, and inferior  scarring.  A repeat cardiac catheterization was carried out, which  demonstrated significant three-vessel coronary artery disease.   The patient has a history of atrial fibrillation for at least the past  10 years, previously had been hospitalized on a ventilator for 2 weeks  with respiratory failure, question of pulmonary infection or amiodarone  toxicity.  He has currently been off amiodarone.   CARDIAC RISK FACTORS:  A remote smoker, quit 30 years ago.  No previous  history of stroke.  Denies hypertension or known hyperlipidemia.   CURRENT MEDICAL PROBLEMS:  1. On Coumadin for chronic atrial fibrillation.  2. History of respiratory failure 2 years ago on a ventilator for over      week.  3. History of kidney stones.  4. History of mild dementia.   PAST SURGICAL HISTORY:  Appendectomy, tonsillectomy, rotator cuff repair  in 1980, bilateral knee surgeries, pacemakers x2, question of bilateral  vein stripping, however, the patient has had venous mapping done  recently and  appears to have adequate pain in both thighs for bypass.   SOCIAL HISTORY:  The patient is married, lives with his wife.  He is  retired from [employer].  He does note that he drinks 4-5 alcohol beverages a  day.   REVIEW OF SYSTEMS:  He denies amaurosis or TIAs.  Both he and his wife  admit that he has had difficulty with his memory, has complaints of  kyphosis and diffuse arthritis.   CURRENT MEDICATIONS:  1. Diltiazem 240 mg a day.  2. Omeprazole 20 mg a day.  3. Coumadin 2.5 alternating with 5 daily.  4. Digoxin 0.125.  5. Finasteride.  6. Simvastatin.  7. Ambien.   PHYSICAL EXAMINATION:  VITAL SIGNS:  The patient's blood pressure is  117/63, pulse is 70 and regular, respiratory rate is 18, and O2 sats  94%.  GENERAL:  The patient is alert and neurologically intact and is able to  relate answer appropriate questions.  His mental status appears much  improved from the first visit to the office.  NECK:  He has no carotid bruits.  LUNGS:  Clear.  CARDIAC:  Regular rate and rhythm as he has no murmur or aortic  stenosis.  ABDOMEN:  Without palpable masses.  EXTREMITIES:  He does have varicosities in both lower extremities.  There is no obvious incisions from previous vein stripping.   STUDIES:  Cardiac catheterization films are reviewed.  The report notes  a 30 mm gradient across his aortic valve.  This is not confirmed with  the recent echocardiogram, ejection fraction is 45-50%.  He has 70% LAD  disease, approximately 70% proximal circumflex disease, the right  coronary artery is small vessel with 70% stenosis.   Pulmonary function studies were performed, which showed FEV1 of 2.3,  diffusion capacity is in 80% range.  It appears to have compressible  bilateral adequate vein for bypass until the midcalf where there are  multiple varicosities from the midcalf to the ankle.   IMPRESSION:  An 77 year old male with mild dementia, positive Cardiolite  stress test with lateral  ischemia, and significant three-vessel coronary  artery disease, and underlying atrial fibrillation.  I have discussed  with the patient and his wife on several occasions the significance of  his coronary artery disease and the risks of surgery including death,  infection, stroke, myocardial infarction, renal failure, bleeding, and  blood transfusion.  I have discussed planning to proceed with coronary  artery bypass grafting, ligation of left atrial appendage, and  consideration of Maze procedure.  The patient has been on Coumadin, not  on aspirin.  We will stop his Coumadin, having continue with aspirin 81  mg a day prior to surgery.  The patient is willing to proceed after  reviewing his options and discussing also further with Dr. Rex Kras.  We  will tentatively plan for a surgery on June 16.      Lanelle Bal, MD  Electronically Signed     EG/MEDQ  D:  12/09/2008  T:  12/10/2008  Job:  [id number]   cc:   Hal T. Stoneking, M.D.  Jeanella Craze. Little, M.D.

## 2010-11-14 NOTE — Cardiovascular Report (Signed)
[name] [name], [name] [name] NO.:  [id number]   MEDICAL RECORD NO.:  [id number]          PATIENT TYPE:  OIB   LOCATION:  2899                         FACILITY:  [facility]   PHYSICIAN:  Tery Sanfilippo, MD     DATE OF BIRTH:  [birth year]   DATE OF PROCEDURE:  01/21/2008  DATE OF DISCHARGE:  01/21/2008                            CARDIAC CATHETERIZATION   This is a generator change of an St. Jude integrity AFx DR serial number  [id number] generator.   PROCEDURE PERFORMED:  Generator change out of a pacemaker.   INDICATIONS:  Mr. [name] [name] is an 77 year old gentleman who has  reached ERI on his St. Jude pacemaker.  Date of insertion was February 29, 2000.  He was therefore brought to [facility] Cardiac Catheter[facility]  Laboratory for change of his generator and insertion of a new generator.   DETAILS OF PROCEDURE:  After informed consent, the patient was brought  to [facility] Catheterization Laboratory.  He was given 50 mcg of  fentanyl and 50 mg of diazepam intravenously for IV sedation.  Lidocaine  30 mL were infiltrated around the right subclavicular region where the  prior pacemaker was implanted after the right chest had been prepped and  draped in usual sterile fashion.  An incision about 3-cm long was made,  where a prior incision had been made at the time of previous  implantation.  Thrombosuction was used to carry this down to the pocket.  The old integrity pacemaker model AFx DR [phone] serial number [id number] was  retrieved and explanted.  It was disconnected from the atrial and  ventricular lead using a screwdriver.  Both atrial and ventricular leads  were then tested for impedance and threshold values.  It was found to  have an impedance of 443 ohms.  Since it was in atrial fibrillation,  thresholds could not be measured.  Ventricular lead model number [id number]  serial number [id number] was measured and noted at 4.95 to 5.3 mV with the  lead impedance to the 512 ohms with a  threshold 0.5 V at 0.5 msec.  The  atrial lead model number [id number] serial number [id number] was tested as  above.  The pocket was then irrigated with 1% clindamycin solution.  A  new generator Zephyr XLDR model number [id number], serial number [id number] was  then connected to the atrial and ventricular leads and placed into the  old pacer pocket.  The pocket was then sutured with 2-0 Vicryl to the  bottom and 4-0 Vicryl to the skin.  The patient was returned to the  [facility] without any complications.   FINAL CONCLUSIONS:  Successful change out of a St. Jude generator  integrity AFx DR model number [id number] serial number [id number] with insertion  of a St. Jude [employer] model number [id number] serial number [id number]  generator.      Tery Sanfilippo, MD  Electronically Signed     Tery Sanfilippo, MD  Electronically Signed   HS/MEDQ  D:  02/05/2008  T:  02/06/2008  Job:  [id number]

## 2010-11-17 NOTE — Procedures (Signed)
[place]  Patient:    [name] [name], [name] [name] Visit Number: [id number] MRN: [id number]          Service Type: MED Location: MICU 2111 01 Attending Physician:  Baird Lyons Driver Dictated by:   Alla German, M.D. Proc. Date: 08/06/01 Admit Date:  07/30/2001   CC:         Chase Picket, M.D.                           Procedure Report  PROCEDURE PERFORMED:  Transesophageal echocardiogram.  ATTENDING:  Alla German, M.D.  COMPLICATIONS:  None.  INDICATIONS:  [name] [name] is a 77 year old male, patient of Dr. Chase Picket, with a history of paroxysmal atrial fibrillation, history of permanent pacemaker placement, admitted with increasing shortness of breath and probable ARDS.  He is now referred for transesophageal echocardiogram to rule out endocarditis.  DESCRIPTION OF PROCEDURE:  After giving informed written consent, the patient was given 25 mg of IV Demerol.  He was maintained on his Fentanyl drip.  The patient was previously intubated.  The patient then underwent successful and uncomplicated transesophageal echocardiogram.  This revealed mild eccentric LVH with moderate depressed LV systolic function with estimated EF of 30-35%, with global hypokinesis.  The aortic valve appeared to be mildly thickened with no evidence of significant area of stenosis or regurgitation.  There was mild thickening of the anterior posterior mitral valve leaflet with mild mitral regurgitation.  There was a structurally normal tricuspid valve with mild tricuspid regurgitation.  There is a pacer wire noted in the RA and RV, with no evidence of endocarditis or vegetation.  There is no evidence of intracardiac mass, thrombus, or vegetations.  There is no patent foramina valley by color Doppler.  There is mild calcification of the descending thoracic aorta.  CONCLUSIONS:  Successful transesophageal echocardiogram with findings as  noted above. Dictated by:   Alla German, M.D. Attending Physician:  Baird Lyons Driver DD:  [id number] TD:  08/07/01 Job: [id number]

## 2010-11-17 NOTE — Discharge Summary (Signed)
[place]. [facility]  Patient:    [name] [name], [name] [name] Visit Number: [id number] MRN: [id number]          Service Type: ECR Location: [address] 01 Attending Physician:  Mathews Argyle Dictated by:   Hal T. Stoneking, M.D. Admit Date:  08/22/2001 Discharge Date: 08/29/2001                             Discharge Summary  ADDENDUM:  Please refer to the interim summary dictated on August 18, 2001. Mr. Nardozzi had a very prolonged hospitalization as described in the previous summary.  He improved significantly from his alveolitis and ventilator-dependent respiratory failure.  On August 18, 2001, he was transferred to my service and from that point forward his respiratory status continued to improve.  His primary problem at that point was deconditioning. No significant medical problems occurred between August 19, 2001, and August 22, 2001.  On August 22, 2001, he was transferred to subacute rehabilitation for further strengthening.  His medications are as listed in the interim summary. Dictated by:   Hal T. Stoneking, M.D. Attending Physician:  Mathews Argyle DD:  09/12/01 TD:  09/13/01 Job: [id number]

## 2010-11-17 NOTE — Op Note (Signed)
[place]. [facility]  Patient:    [name] [name], [name] [name]                       MRN: [id number] Proc. Date: 02/29/00 Adm. Date:  [id number] Attending:  Epifanio Lesches CC:         Jeanella Craze. Little, M.D.             CP Lab             Lorretta Harp, M.D.                           Operative Report  PROCEDURE:  Implantation of permanent DDDR A-V universal multiprogrammable, rate adjustable, mode-switching, autothreshold, rate-adaptive capable dual-chamber pacer with in-line coaxial siliconed passive fixation PaceSetter atrial and ventricular electrodes.  IMPLANTING PHYSICIAN:  Richard A. Rollene Fare, M.D.  COMPLICATIONS: None.  ESTIMATED BLOOD LOSS:  Approximately 30 cc.  ANESTHESIA:  5 mm Valium p.o. premedication, 2 mg Nubain and 2 mg Versed for sedation during the procedure.  PULSE GENERATOR:  PaceSetter Integrity AFX-DR model [id number]; SN: [id number].  Steroid-eluting PaceSetter atrial preformed J passive fixation IS-1 electrode, model [id number]; SN: [id number].  Ventricular electrode PaceSetter passive fixation finned, steroid-eluting 52 cm, model #1336T; SN: [id number].  MAGNET RATE:  At BOL = 98.6 and at EOL magnet rate drops to 68.  PREOPERATIVE DIAGNOSES:  1. Sick sinus syndrome, paroxysmal atrial fibrillation - last episode July     2000 and chronic amiodarone.  2. Symptomatic bradycardia, exercise intolerance, weakness and presyncope     with documented bradycardia less than 40 and intermittent 2:1 heart-block.  3. Coronary artery disease, status post ______ 1997 treated with     percutaneous transluminal coronary angioplasty of OM-1 and OM-2.  4. Recatheterization with mild coronary artery disease January 26, 1999.     Ejection fraction greater than 50%.  No significant restenosis prior     percutaneous transluminal coronary angioplasty sites.  Dense left coronary     calcification - medical therapy, stable.  8. Hyperlipidemia.  9. Benign prostatic  hypertrophy. 10. Sebaceous cyst mid back. 11. Recent dental work, crown, well-healed.  No infection.  POSTOPERATIVE DIAGNOSES:  1. Sick sinus syndrome, paroxysmal atrial fibrillation - last episode July     2000 and chronic amiodarone.  2. Symptomatic bradycardia, exercise intolerance, weakness and presyncope     with documented bradycardia less than 40 and intermittent 2:1 heart-block.  3. Coronary artery disease, status post ______ 1997 treated with     percutaneous transluminal coronary angioplasty of OM-1 and OM-2.  4. Recatheterization with mild coronary artery disease January 26, 1999.     Ejection fraction greater than 50%.  No significant restenosis prior     percutaneous transluminal coronary angioplasty sites.  Dense left coronary     calcification - medical therapy, stable.  8. Hyperlipidemia.  9. Benign prostatic hypertrophy. 10. Sebaceous cyst mid back. 11. Recent dental work, crown, well-healed.  No infection.  BIPOLAR ATRIAL THRESHOLD:  P = 1.8 mV, impedance 460 ohms, minimal threshold 0.7 v.  VENTRICULAR BIPOLAR:  R = 5.5 mV, impedance 600 ohms, minimal threshold 0.4 v.  UNIPOLAR ATRIAL:  P = 1.8 mV, impedance 420 ohms, minimal threshold 0.6 v.  UNIPOLAR VENTRICULAR: R = 5.5 mV, impedance 500 ohms, minimal threshold 0.5 v.  PROCEDURE:  The patient was brought to the second floor cardiopulmonary laboratory in the postabsorptive state after 5 mg  Valium p.o. premedication. The right anterior chest was prepped, draped in the usual manner.  Xylocaine 1% was used for local anesthesia.  The patient was given 2 mg of Nubain and 2 mg of Versed for sedation during the procedure. A right infraclavicular curvilinear and transverse incision was performed and brought down to the prepectoral fascia using blunt dissection and electrocautery to control hemostasis.  A pulse generator pocket was formed in a prepectoral plane above the pectoral fascia.  Several sticks were necessary to  enter the right subclavian vein, but this was done without incident and a stainless steel J-tipped guidewire was used for a guidewire and sheath exchange and kept until the electrodes were positioned properly and then removed.  Two #10 peel-away Cook introducers were used to introduce the atrial and ventricular electrodes in tandem.  The ventricular electrode was positioned at the RV apex and the atrial electrode in the right atrial appendage confirmed by fluoroscopy and rotational maneuvers.  Threshold testing was performed along with retrograde conduction studies and intracardiac electrograms.  The electrode were secured at the insertion site with a previously placed #1 figure-of-eight silk suture to prevent migration and control of hemostasis and further secured with two interrupted #1 silk sutures around a silicone sewing collar for each electrode.  Guidewire was previously removed.  There was no diaphragmatic pacing on threshold testing at 10 v output unipolar and bipolar and no esophageal pacing or stimulation on the atrial electrodes.  Excellent intracardiac electrograms were obtained with biphasic R-wave of excellent current ______ and "slew rate."  Retrograde conduction was done with ventricular pacing and atrial recordings through the implanted electrodes at 70 per minute.  There was retrograde Wenckebach and above that there was no retrograde conduction through the implanted electrodes.  The generator conformed to manufacturer specification on PSA testing with outputs of 3.3 volts on atrial and ventricular channel at pulse width of 0.4. The electrodes were connected to the generator after the pocket was irrigated with 500 mg of kanamycin solution.  Proper A-V sequence was connected.  A single hex nut tightened for each electrode.  The generator was delivered into the pocket with the electrodes looped behind.  It was loosely secured to the underlying muscle and fascia with a #1  silk suture to prevent migration.  Sponge count was correct.  The subcutaneous tissue was closed with two separate running layers of 2-0 Dexon suture and the skin was closed with 5-0 subcuticular Dexon suture.  Steri-Strips were then applied.  Fluoroscopy showed good position of the atrial and ventricular electrodes.  There was no pneumothorax.  The patient tolerated the procedure well and was transferred to the holding area for postoperative interrogation and programming in stable condition. DD:  02/29/00 TD:  02/29/00 Job: [id number]

## 2010-11-17 NOTE — Discharge Summary (Signed)
[place]. [facility]  Patient:    [name] [name], [name] [name] Visit Number: [id number] MRN: [id number]          Service Type: ECR Location: [place] [facility] Attending Physician:  Mathews Argyle Dictated by:   Hal T. Stoneking, M.D. Admit Date:  08/22/2001 Discharge Date: 08/29/2001   CC:         Derrill Kay, M.D.  Chase Picket, M.D.   Discharge Summary  ADMISSION DIAGNOSIS:  Deconditioning.  DISCHARGE DIAGNOSES: 1. Deconditioning improved. 2. Status post ventilator dependent respiratory failure secondary to acute    lung injury versus bronchiolitis obliterans and organizing pneumonia. 3. Paroxysmal atrial fibrillation, history of cardiomyopathy. 4. Pacemaker. 5. Renal insufficiency. 6. Mild normocytic normochromic anemia, hemoglobin 10.2.  BRIEF HISTORY:  [name] [name] is a delightful 77 year old white male.  He is status post a very serious illness.  He had been followed as an outpatient on antibiotics for a presumed pneumonia.  He failed to get better. He was referred to [facility] D. Young, M.D., on July 30, 2001, and was admitted at that time for the initiation of steroids for what appeared to be a lung injury.  He was also placed on broad spectrum antibiotics.  Despite this, he had respiratory failure requiring a prolonged ventilation time. He was then followed by Derrill Kay, M.D.  He was also followed by Chase Picket, M.D., for cardiology.  He did have a TEE done during that hospitalization which revealed moderately depressed LV dysfunction, EF of 30 to 35%, mild thickening at the aortic valve.  The pacemaker wire appeared normal.  No evidence of endocarditis or vegetation.  He had a fiberoptic bronchoscopy on August 05, 2001, by Dr. Patsey Berthold showing bilateral pulmonary infiltrates, possibility of contribution of amiodarone toxicity was felt to possibly play a role.  He gradually improved in the hospital.  He was switched to Cardizem, Lanoxin,  Altace and Coumadin. Due to his deconditioning, was transferred to the subacute unit.  Course in the subacute unit was really quite uneventful.  He got stronger with his walking.  He remained on one liter of oxygen with O2 sats 93%.  It was the opinion of physical therapy that pulmonary rehab and additional physical therapy at home would be beneficial.  At the time of discharge, his INR was 3.9 on 3 mg of Coumadin.  This will be held today and reduced to 1.5 mg alternating with 3 mg every other day, a protime to be checked in the office on September 01, 2001.  Also he had been placed on prednisone. This was reduced to 20 mg and will be continued until he has a chance to see Dr. Patsey Berthold as an outpatient where she will monitor tapering this medication.  DISPOSITION:  The patient is discharged home in improved condition.  MEDICATIONS AT DISCHARGE: 1. Prednisone 20 mg a day. 2. Lanoxin 0.125 mg a day. 3. Altace 2.5 mg a day. 4. Cardizem CD 120 mg a day. 5. Coumadin 3 mg alternating with 1.5 every other day to start 1.5 on    August 30, 2001. 6. Protonix 40 mg a day. 7. He is to restart his Lipitor 20 mg a day.  FOLLOW-UP:  He will have follow-up with Dr. Patsey Berthold in approximately two weeks, Hal T. Stoneking, M.D., in four weeks, and Dr. Chase Picket in six to eight weeks. Dictated by:   Hal T. Stoneking, M.D. Attending Physician:  Mathews Argyle DD:  08/29/01 TD:  08/29/01 Job: [id number]

## 2010-11-17 NOTE — Discharge Summary (Signed)
[place]. [facility]  Patient:    [name], [name] Visit Number: [id number] MRN: [id number]          Service Type: MED Location: [id number] Attending Physician:  Renold Don Dictated by:   Thomasene Lot, RN, MSN, ACNP Admit Date:  07/30/2001 Disc. Date: 08/18/01   CC:         Hal T. Stoneking, M.D.  Meriel Flavors, M.D.  Clinton D. Annamaria Boots, M.D.  Chase Picket, M.D.   Discharge Summary  INTERIM DISCHARGE SUMMARY  DATE OF BIRTH:  Mar 27, 1926.  DISCHARGE DIAGNOSES: 1. Ventilator-dependent respiratory failure secondary to acute lung injury    versus bronchiolitis obliterans and organizing pneumonia. 2. Paroxysmal atrial fibrillation with cardiomyopathy. 3. Debility. 4. Renal insufficiency.  HISTORY OF PRESENT ILLNESS:  [name] [name] is a 77 year old white male who was referred by Dr. [name], to Dr. Baird Lyons on July 30, 2001, for evaluation of pulmonary infiltrates.  He is followed by Dr. Chase Picket for coronary artery disease and had been on amiodarone for three years that was stopped today per Dr. Leta Jungling.  That days date was July 30, 2001.  He has had an approximately two-week history of general malaise, increasing hypoxia.  He had been treated as an outpatient with Zithromax without improvement in his general malaise and shortness of breath.  A chest x-ray revealed bilateral pulmonary infiltrates and having proven refractory to outpatient treatment, he is admitted to [facility] initially to the floor for further evaluation and treatment.  While in the hospital, his pulmonary status continued to decline until he was moved to the intensive care unit on August 03, 2001.  His pulmonary status continued to decline and require orotracheal intubation and mechanical ventilatory support starting on August 05, 2001.  At that time Dr. Baird Lyons asked the pulmonary critical care physician, i.e., Dr. Derrill Kay, to assume his care while in the ICU.  LABORATORY DATA:  Fungus culture shows yeast consistent with Candida. Arterial blood gas on 60%, pH 7.47, PCO2 of 53, PO2 of 82.  LDH is 349, LDH isoenzymes are 1.  AFBs are negative.  Fungus cultures demonstrate yeast. Virus cultures are unremarkable.  WBC is 18.5, hemoglobin 11.4, hematocrit 34.0, platelets of 386.  ESR is 71.  INR 2.2, PTT is 41.  Sodium is 154, potassium 3.7, chloride 110.  Arterial blood gas on August 19, 2001, shows pH 7.46, PCO2 of 40, PO2 of 49, that is on 21%.  On February 18, hemoglobin 10.1, hematocrit 29.1, platelets 241, WBC is 14.1, MCV 92.5.  Sodium is 138, potassium 3.4, chloride 105, CO2 28, BUN 20, creatinine 0.9, glucose 76. Again on August 19, 2001, another BUN, reached a peak of 45, creatinine reached a peak of 1.1.  Calcium 7.8.  LDH isoenzymes were unable to accurately quantitate.  Chest x-ray on August 18, 2001, shows bilateral lower lobe infiltrates with interval increase on the left.  A 12-lead EKG shows atrial fibrillation, marked ST abnormality, a ventricular rate of 89, with intermittently paced beat.  PROCEDURES: 1. A TEE performed on August 06, 2001, by Dr. Alla German demonstrates    there is mild eccentric left ventricular hypertrophy, moderate depressed LV    systolic function, with an EF of 30-35%.  The aortic valve is mildly    thickened.  There is no evidence of vegetation.  A pacer wire is noted in    the right atrium and right ventricle.  Again, with no evidence of    endocarditis or vegetation.  There is no evidence of any cardiac mass,    thrombus, or vegetation. 2. Fiberoptic bronchoscopy on August 05, 2001, by Dr. Derrill Kay    demonstrates bilateral pulmonary infiltrates, unknown etiologies, infection    versus acute lung injury versus amiodarone toxicity versus opportunistic    infection. 3. Endotracheal intubation February 4-10, 2003. 4. Left internal jugular  central vein in February 4, and it has been removed. 5. Right radial arterial line, inserted on February 4, was out by February 10. 6. Foley catheter, which was removed by February 10.  HOSPITAL COURSE:  #1 -  RESPIRATORY FAILURE:  [name] [name], after being transferred to the intensive care unit on August 03, 2001, developed increasing respiratory failure.  The etiology was unknown, as acute lung injury versus bronchiolitis obliterans and organizing pneumonia, versus amiodarone toxicity.  Of note, his amiodarone had been stopped the day of his admission to the hospital.  He required mechanical ventilatory support.  He was started on IV steroids, which did create an improvement in his radiographic and in his pulmonary functions.  He was successfully liberated from the ventilator on August 11, 2001, and has reached maximum in-house hospital benefit, by August 19, 2001, is ready for subacute care unit for care secondary to debility from prolonged illness.  #2 -  PAROXYSMAL ATRIAL FIBRILLATION AND CARDIOMYOPATHY:  He was followed by Dr. Chase Picket.  Of note, had one episode of paroxyxmal atrial fibrillation but has remained stable.  He has not been started back on amiodarone.  He is being monitored by Dr. Chase Picket for any further evaluations and treatment.  A transesophageal echocardiogram was performed by Dr. Alla German, with the results as per the aforementioned procedure.  #3 -  DEBILITY:  He is known to be weakened secondary to a prolonged hospitalization.  He will need subacute care unit or other facility care to regain his strength that has been drained secondary to a prolonged hospitalization.  #4 -  RENAL INSUFFICIENCY:  His renal insufficiency remains stable with his creatinine being 1.0 by day of discharge.   DISCHARGE MEDICATIONS:  1. Lovenox 40 mg q.24h.  2. Xopenex 1.25 mg q.8h.  3. Lanoxin 0.125 mg q.d.  4. Protonix 40 mg b.i.d.  5. Altace 2.5 mg b.i.d.   6. Prednisone 40 mg q.d. for the next five days, then 30 mg a day for the     next five days, then 20 mg and stay until further evaluated by the     physician.  7. Cardizem CD 120 mg q.d.  8. Ensure 237 ml t.i.d.  9. Laxative of choice. 10. Tylenol 650 mg q.4h. p.r.n.  DISCHARGE INSTRUCTIONS:  Diet is a low-salt, low-fat diet.  FOLLOW-UP:  In subacute care unit per his primary care physician, Dr. [name].  DISPOSITION/CONDITION ON DISCHARGE:  Acute respiratory failure secondary to unknown etiology, most likely acute lung injury from amiodarone versus bronchiolitis obliterans and organizing pneumonia.  He has now been liberated from the vent and is severely debilitated from that prolonged hospitalization and requires rehabilitation in the subacute care unit.  He is being discharged in improved condition. Dictated by:   Thomasene Lot, RN, MSN, ACNP Attending Physician:  Renold Don DD:  08/19/01 TD:  08/19/01 Job: [id number]

## 2010-11-17 NOTE — Procedures (Signed)
[place]. [facility]  Patient:    [name] [name], [name] [name] Visit Number: [id number] MRN: [id number]          Service Type: MED Location: [facility] 2111 01 Attending Physician:  Baird Lyons Driver Dictated by:   Derrill Kay, M.D. Proc. Date: 08/05/01 Admit Date:  07/30/2001   CC:         Clinton D. Annamaria Boots, M.D.   Procedure Report  DATE OF BIRTH:  1925/09/29.  PROCEDURE:  Fiberoptic bronchoscopy.  INDICATION:  Ms. Smutny is a 77 year old with bilateral pulmonary infiltrates with progressive hypoxemic respiratory failure, who required emergent intubation earlier in the day.  Because of the nature of his infiltrates and inability to make a diagnosis, a diagnostic bronchoscopy was performed at the bedside.  DESCRIPTION OF PROCEDURE:  The Pentax fiberoptic bronchoscope was passed via Portex adaptor through the existing ET tube.  The patient still has the effects of succinylcholine and Amidate given by anesthesia for intubation. The patient had supplemental fentanyl and Versed given throughout the procedure as per sedation protocol.  The patient was placed on 100% FIO2.  He was being mechanically ventilated at the time.  Respiratory and nursing were in attendance.  The bronchoscope was passed via the Portex adapter, advanced through the ER tube.  The ET tube was 5 cm above the carina.  The scope was then advanced to the carina level, which was sharp.  Trachea and carina, however, appeared very hyperemic.  As far as the remainder of the tracheobronchial tube, which was examined fairly quickly, no endobronchial lesions were noted.  The bronchoscope was then wedged at the right middle lobe bronchus and a bronchoalveolar lavage was performed, obtaining approximately 60 cc of bronchoalveolar lavage for micro and pathology samples.  At this point the bronchoscope was pulled back and any excessive secretions were suctioned, and the procedure was then terminated.   The patient tolerated this procedure well.  IMPRESSION:  Bilateral pulmonary infiltrates of unknown etiology, infectious versus acute lung injury versus amiodarone toxicity versus opportunistic infection.  PLAN:  Await laboratory data.  Samples have been sent for cell count and differential, T-cell subsets, cytology, microbiology data to include AFB, fungal, influenza culture, and PCP, as well as Legionella DFA and cultures and routine cultures.  Further plans pending response to the above. Dictated by:   Derrill Kay, M.D. Attending Physician:  Baird Lyons Driver DD:  [id number] TD:  08/06/01 Job: [id number]

## 2010-11-17 NOTE — H&P (Signed)
[place]. [facility]  Patient:    [name] [name], [name] [name] Visit Number: [id number] MRN: [id number]          Service Type: Attending:  Tarri Fuller D. Annamaria Boots, M.D. Dictated by:   Kasandra Knudsen. Annamaria Boots, M.D. Adm. Date:  07/30/01   CC:         Hal T. Stoneking, M.D.  Chase Picket, M.D.  Sigmund I. Gaynelle Arabian, M.D.   History and Physical  DATE OF BIRTH:  2025-10-06  PROBLEM:  Dyspnea, malaise, pulmonary infiltrates.  HISTORY:  This is a 77 year old white male referred over today by Dr. [name] for evaluation of pulmonary infiltrates.  He is followed by Dr. Aldona Bar for coronary artery disease and has been on amiodarone for three years, stopped as of today by [name].  He reports feeling in his usual stable health until the first week of January when he had a cold.  There was significant rhinorrhea, but little fever or cough initially.  It moved to the chest.  He began coughing white or trace yellow mucous.  He began to feel better and went back to his daily routine.  Two weeks ago, he went on a prolonged bike ride on a cold day for over an hour.  He feels he overdid it.  The next day, he noted fever and chills and was treated with Zithromax.  Stools were a little loose from Zithromax but otherwise no GI upset.  Three to four days ago, he was seen at the walk-in clinic because of continued malaise.  He was then seen in follow-up by [name] with chest x-ray discovering pulmonary infiltrates.  He had had some discomfort in his left lower leg and had Doppler studies reported negative for deep vein thrombosis and he was told that he had a superficial phlebitis.  He was given some Tequin.  He took one day and did not feel well and did not take anymore.  He now tells me that he feels badly enough that he feels he should be in the hospital.  REVIEW OF SYSTEMS:  Generalized headache without stiff neck or blurred vision. Rash noted at least two to three weeks,  probably starting before Zithromax, but unclear.  Definitely started before Tequin.  No reflux or dysphagia, nausea or vomiting.  Minimal loose stools.  Still having fever and chills with temperature reported about 100.  No bleeding, adenopathy or ankle edema.  No palpitations or exertional chest pain.  He feels generally unwell and has not been very active the last few days.  Otherwise, does not recognize significant dyspnea.  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 Aldona Bar, pacemaker, elevated cholesterol, benign prostatic hypertrophy (Dr. Risa Grill), flank hernia, right side, nephrolithiasis, diet controlled hypertension, appendectomy, tonsillectomy, right knee surgery, rotator cuff tear right shoulder.  He has had pneumococcal vaccine twice, flu vaccine this fall, recent Hemoccult negative stools.  FAMILY HISTORY:  Father died of a stroke, mother died of old age, 18 siblings, one had bladder cancer, one breast cancer, six children are well.  SOCIAL HISTORY:  Second marriage, six children, retired Hotel manager, quit smoking in 1976.  He has an occasional glass of wine.  PHYSICAL EXAMINATION:  VITAL SIGNS:  Blood pressure 101/60, weight 183 pounds, 70 inches.  GENERAL:  Alert, intelligent, kyphotic gentleman, no acute distress.  SKIN:  Diffuse macular papular rash on dry skin, especially noted on the back and chest.  No adenopathy.  HEENT:  Oral mucosa is clear.  No jugular venous distension or stridor.  CHEST:  Suprisingly clear when compared with the x-ray.  A couple of minor crackles in the bases.  No wheezing or rhonchi, no increased work of breathing, no dullness or rub.  HEART:  Grade I/VI systolic ejection murmur left sternal border.  Rhythm is regular to palpation.  ABDOMEN:  No enlargement of the liver or spleen.  EXTREMITIES:  No clubbing, cyanosis, or edema.  No tremor.  Calves are  soft.  Chest x-ray revealed film taken 07/28/01 at the [place] office showed CT ratio of 17/33, cardiac enlargement, pacemaker right upper chest, bilateral patchy infiltrates especially in the lower zones bilaterally.  No effusion. Cannot exclude right hilar nodes.  LABORATORY:  Recent blood work by Dr. Gaynelle Arabian included renal insufficiency with BUN 34, creatinine 1.6, mild elevation of alk phos and SGPT.  TSH normal at 0.10, sedimentation rate elevated at 58, white blood count 11,400, hemoglobin 13.6, urinalysis showed 10-20 red cells per high powered field.  IMPRESSION: 1. Bilateral infiltrates. 2. Fever. 3. Rash. 4. Dilated cardiomyopathy with pacemaker. 5. Superficial phlebitis left leg.  PLAN:  He is being admitted for hospitalization so that we can watch on telemetry as he is initially off of amiodarone and until we can watch for any rapid change in infiltrates as he is begun on prednisone.  If this is either BOOP or amiodarone induced lung disease with amiodarone stopped, hopefully the infiltrates will clear rapidly with prednisone.  How much of this syndrome may represent a hypersensitivity response to amiodarone versus acute viral illness on top of or chronic amiodarone induced lung disease will be difficult to determine.  I have discussed his management with [name] and with Dr. Aldona Bar. Dictated by:   Kasandra Knudsen. Annamaria Boots, M.D. Attending:  Kasandra Knudsen. Annamaria Boots, M.D. DD:  07/30/01 TD:  07/30/01 Job: [id number]

## 2010-11-17 NOTE — Discharge Summary (Signed)
[place]. [facility]  Patient:    [name] [name], [name] [name]                       MRN: [id number] Adm. Date:  [id number] Disc. Date: [id number] Attending:  Epifanio Lesches Dictator:   Berlin Hun, P.A. CC:         Jeanella Craze. Little, M.D.                           Discharge Summary  DATE OF BIRTH:  [birth year]  DISCHARGING PHYSICIAN:  Alla German, M.D.  ADMISSION DIAGNOSES: 1. Bradycardia with second degree heart block, sick sinus syndrome,    maintaining sinus rhythm on chronic amiodarone. 2. Elective pacemaker implantation. 3. Mild coronary artery disease. 4. Nonsustained ventricular tachycardia. 5. Tobacco abuse.  DISCHARGE DIAGNOSES: 1. Bradycardia with second degree heart block, sick sinus syndrome,    maintaining sinus rhythm on chronic amiodarone. 2. Status post permanent pacemaker implantation using an Integrity AFXDR DDDR    model 5346 pulse generator, serial [id number] with passive plus DX atrial and    ventricular leads. 3. Mild coronary artery disease. 4. Nonsustained ventricular tachycardia. 5. Tobacco abuse.  HISTORY OF PRESENT ILLNESS:  The patient is a very pleasant 77 year old white male, father of six, and grandfather of 1.  Patient of Dr. Aldona Bar and [name], referred for possible pacemaker implantation secondary to bradycardia and known sick sinus syndrome with a history of PAF and nonsustained VT.  The patient has been seen in consultation by Dr. Fransico Him in July of 2000, when he was in atrial fibrillation.  At that time, he had NSVT and underwent cardiac catheterization and was found to have very mild CAD and normal systolic function.  He was treated with amiodarone and converted to sinus rhythm, and has remained in sinus since.  A recent Holter monitor showed bradycardia with rates down to 38, particularly during sleep, but also during the day.  He had an episode of secondary heart block that was  asymptomatic.  He denied syncope, presyncope, or significant dizziness.  The patient has developed a near phobia of going to sleep because he was concerned about his slow heart rate and the possibility of problems related to this.  He does have relative chronotropic incompetence and sensitive heart rate and will increase to 90 at times with simple walking.  It is felt that permanent pacemaker implantation is a possibility.  However, it is difficult to accurately assess the benefits or to know whether exercise tolerance and general feeling of __________ strength would improve pacing. He does have normal LV function, so a pacer induced left bundle branch block should not cause any problems.  Risks and benefits of pacemaker implantation had been explained, as well as procedure.  He is agreeable to undergo pacemaker implantation on an elective basis.  PROCEDURES:  Permanent pacemaker implantation on February 29, 2000, by Dr. Terance Ice using an Integrity AFXDR pulse generator DDDR, model 5346, serial [id number].  Passive DX Plus 46 cm endocardial steroid eluding tined atrial J-lead, serial [id number], and a passive DX 52 cm endocardial steroid eluding finned lead for the ventricular component, serial [id number].  Please see details of threshold testing, etc. from the procedure.  COMPLICATIONS:  None.  CONSULTATIONS:  None.  HOSPITAL COURSE:  The patient was admitted and pre-pacer laboratory studies were within normal limits with a BUN of 21, creatinine 1.3, potassium 4.6.  LFTs within normal limits.  WBC 4.6, hemoglobin 14.8, platelet 208.  TSH 0.85. UA negative.  Dr. Rollene Fare proceeded with permanent pacemaker implantation with the above described pulse generator without difficulty.  The patient tolerated the procedure well.  Post pacer chest x-ray showed no evidence of pneumothorax.  There was mild bibasilar atelectasis.  The patient remained stable post procedure and was  discharged home on March 01, 2000, A-V pacing.  DISCHARGE MEDICATIONS: 1. Enteric-coated aspirin 325 mg a day to be started on Sunday, September 2. 2. Amiodarone 200 mg a day. 3. Mevacor 20 mg a day. 4. Restoril 15 mg at night as needed for sleep.  ACTIVITY:  No strenuous activity or use of the right arm over shoulder level or for any heavy activity until seen back by Dr. Gwenlyn Found.  He should not allow the seat belt to lay on his pacer site.  WOUND CARE:  He is to sponge bathe for the next 4-5 days, and then may shower, but should keep the Steri-Strips in place, and keep Betadine on the incisional site.  FOLLOWUP:  A follow up appointment has been scheduled with Dr. Rollene Fare on September 13 at 11:45, and Dr. Rex Kras on September 25 at 11:30.  The pacer site is in the right chest wall, and does not show any fluctuance or evidence of infection, and Steri-Strips are intact.  The patient is now discharged to home.  [facility] MEDICAL RECORD NUMBER:  [id number]. DD:  03/01/00 TD:  03/04/00 Job: [id number]

## 2011-02-28 ENCOUNTER — Other Ambulatory Visit: Payer: Self-pay | Admitting: Dermatology

## 2011-03-30 LAB — URINALYSIS, ROUTINE W REFLEX MICROSCOPIC
Bilirubin Urine: NEGATIVE
Ketones, ur: NEGATIVE
Nitrite: NEGATIVE
Protein, ur: NEGATIVE
Urobilinogen, UA: 1
pH: 6

## 2011-07-06 DIAGNOSIS — I4891 Unspecified atrial fibrillation: Secondary | ICD-10-CM | POA: Diagnosis not present

## 2011-07-06 DIAGNOSIS — I2510 Atherosclerotic heart disease of native coronary artery without angina pectoris: Secondary | ICD-10-CM | POA: Diagnosis not present

## 2011-07-06 DIAGNOSIS — I10 Essential (primary) hypertension: Secondary | ICD-10-CM | POA: Diagnosis not present

## 2011-07-11 DIAGNOSIS — I4891 Unspecified atrial fibrillation: Secondary | ICD-10-CM | POA: Diagnosis not present

## 2011-07-11 DIAGNOSIS — I495 Sick sinus syndrome: Secondary | ICD-10-CM | POA: Diagnosis not present

## 2011-08-08 DIAGNOSIS — I4891 Unspecified atrial fibrillation: Secondary | ICD-10-CM | POA: Diagnosis not present

## 2011-08-15 DIAGNOSIS — I495 Sick sinus syndrome: Secondary | ICD-10-CM | POA: Diagnosis not present

## 2011-08-22 DIAGNOSIS — R5381 Other malaise: Secondary | ICD-10-CM | POA: Diagnosis not present

## 2011-08-22 DIAGNOSIS — Z79899 Other long term (current) drug therapy: Secondary | ICD-10-CM | POA: Diagnosis not present

## 2011-08-29 DIAGNOSIS — I4891 Unspecified atrial fibrillation: Secondary | ICD-10-CM | POA: Diagnosis not present

## 2011-09-13 DIAGNOSIS — N401 Enlarged prostate with lower urinary tract symptoms: Secondary | ICD-10-CM | POA: Diagnosis not present

## 2011-09-13 DIAGNOSIS — N529 Male erectile dysfunction, unspecified: Secondary | ICD-10-CM | POA: Diagnosis not present

## 2011-09-19 DIAGNOSIS — I495 Sick sinus syndrome: Secondary | ICD-10-CM | POA: Diagnosis not present

## 2011-09-27 DIAGNOSIS — I4891 Unspecified atrial fibrillation: Secondary | ICD-10-CM | POA: Diagnosis not present

## 2011-10-24 DIAGNOSIS — I495 Sick sinus syndrome: Secondary | ICD-10-CM | POA: Diagnosis not present

## 2011-10-26 DIAGNOSIS — R413 Other amnesia: Secondary | ICD-10-CM | POA: Diagnosis not present

## 2011-10-26 DIAGNOSIS — F0280 Dementia in other diseases classified elsewhere without behavioral disturbance: Secondary | ICD-10-CM | POA: Diagnosis not present

## 2011-10-26 DIAGNOSIS — G309 Alzheimer's disease, unspecified: Secondary | ICD-10-CM | POA: Diagnosis not present

## 2011-10-30 DIAGNOSIS — I4891 Unspecified atrial fibrillation: Secondary | ICD-10-CM | POA: Diagnosis not present

## 2011-11-02 DIAGNOSIS — H00019 Hordeolum externum unspecified eye, unspecified eyelid: Secondary | ICD-10-CM | POA: Diagnosis not present

## 2011-11-19 DIAGNOSIS — I10 Essential (primary) hypertension: Secondary | ICD-10-CM | POA: Diagnosis not present

## 2011-11-19 DIAGNOSIS — I4891 Unspecified atrial fibrillation: Secondary | ICD-10-CM | POA: Diagnosis not present

## 2011-11-19 DIAGNOSIS — R413 Other amnesia: Secondary | ICD-10-CM | POA: Diagnosis not present

## 2011-11-27 DIAGNOSIS — I4891 Unspecified atrial fibrillation: Secondary | ICD-10-CM | POA: Diagnosis not present

## 2011-11-28 DIAGNOSIS — I495 Sick sinus syndrome: Secondary | ICD-10-CM | POA: Diagnosis not present

## 2011-12-24 DIAGNOSIS — I4891 Unspecified atrial fibrillation: Secondary | ICD-10-CM | POA: Diagnosis not present

## 2011-12-28 DIAGNOSIS — I10 Essential (primary) hypertension: Secondary | ICD-10-CM | POA: Diagnosis not present

## 2011-12-28 DIAGNOSIS — I4891 Unspecified atrial fibrillation: Secondary | ICD-10-CM | POA: Diagnosis not present

## 2011-12-28 DIAGNOSIS — I2510 Atherosclerotic heart disease of native coronary artery without angina pectoris: Secondary | ICD-10-CM | POA: Diagnosis not present

## 2012-01-02 DIAGNOSIS — I495 Sick sinus syndrome: Secondary | ICD-10-CM | POA: Diagnosis not present

## 2012-01-24 DIAGNOSIS — I4891 Unspecified atrial fibrillation: Secondary | ICD-10-CM | POA: Diagnosis not present

## 2012-02-06 DIAGNOSIS — I495 Sick sinus syndrome: Secondary | ICD-10-CM | POA: Diagnosis not present

## 2012-02-21 DIAGNOSIS — I4891 Unspecified atrial fibrillation: Secondary | ICD-10-CM | POA: Diagnosis not present

## 2012-02-25 DIAGNOSIS — L570 Actinic keratosis: Secondary | ICD-10-CM | POA: Diagnosis not present

## 2012-02-25 DIAGNOSIS — D235 Other benign neoplasm of skin of trunk: Secondary | ICD-10-CM | POA: Diagnosis not present

## 2012-03-12 DIAGNOSIS — I495 Sick sinus syndrome: Secondary | ICD-10-CM | POA: Diagnosis not present

## 2012-03-20 DIAGNOSIS — I4891 Unspecified atrial fibrillation: Secondary | ICD-10-CM | POA: Diagnosis not present

## 2012-03-21 DIAGNOSIS — Z961 Presence of intraocular lens: Secondary | ICD-10-CM | POA: Diagnosis not present

## 2012-03-21 DIAGNOSIS — H52209 Unspecified astigmatism, unspecified eye: Secondary | ICD-10-CM | POA: Diagnosis not present

## 2012-03-21 DIAGNOSIS — H02409 Unspecified ptosis of unspecified eyelid: Secondary | ICD-10-CM | POA: Diagnosis not present

## 2012-04-03 DIAGNOSIS — I4891 Unspecified atrial fibrillation: Secondary | ICD-10-CM | POA: Diagnosis not present

## 2012-04-16 DIAGNOSIS — I495 Sick sinus syndrome: Secondary | ICD-10-CM | POA: Diagnosis not present

## 2012-04-30 DIAGNOSIS — R413 Other amnesia: Secondary | ICD-10-CM | POA: Diagnosis not present

## 2012-04-30 DIAGNOSIS — G4700 Insomnia, unspecified: Secondary | ICD-10-CM | POA: Diagnosis not present

## 2012-05-01 DIAGNOSIS — Z7901 Long term (current) use of anticoagulants: Secondary | ICD-10-CM | POA: Diagnosis not present

## 2012-05-20 DIAGNOSIS — I495 Sick sinus syndrome: Secondary | ICD-10-CM | POA: Diagnosis not present

## 2012-05-27 DIAGNOSIS — Z0000 Encounter for general adult medical examination without abnormal findings: Secondary | ICD-10-CM | POA: Diagnosis not present

## 2012-05-27 DIAGNOSIS — Z1331 Encounter for screening for depression: Secondary | ICD-10-CM | POA: Diagnosis not present

## 2012-06-04 DIAGNOSIS — Z7901 Long term (current) use of anticoagulants: Secondary | ICD-10-CM | POA: Diagnosis not present

## 2012-06-23 DIAGNOSIS — I495 Sick sinus syndrome: Secondary | ICD-10-CM | POA: Diagnosis not present

## 2012-06-27 DIAGNOSIS — Z7901 Long term (current) use of anticoagulants: Secondary | ICD-10-CM | POA: Diagnosis not present

## 2012-07-15 DIAGNOSIS — I2510 Atherosclerotic heart disease of native coronary artery without angina pectoris: Secondary | ICD-10-CM | POA: Diagnosis not present

## 2012-07-15 DIAGNOSIS — I4891 Unspecified atrial fibrillation: Secondary | ICD-10-CM | POA: Diagnosis not present

## 2012-07-24 DIAGNOSIS — I4891 Unspecified atrial fibrillation: Secondary | ICD-10-CM | POA: Diagnosis not present

## 2012-07-29 DIAGNOSIS — I495 Sick sinus syndrome: Secondary | ICD-10-CM | POA: Diagnosis not present

## 2012-08-13 DIAGNOSIS — Z7901 Long term (current) use of anticoagulants: Secondary | ICD-10-CM | POA: Diagnosis not present

## 2012-08-28 DIAGNOSIS — Z7901 Long term (current) use of anticoagulants: Secondary | ICD-10-CM | POA: Diagnosis not present

## 2012-09-03 DIAGNOSIS — I495 Sick sinus syndrome: Secondary | ICD-10-CM | POA: Diagnosis not present

## 2012-09-25 DIAGNOSIS — Z7901 Long term (current) use of anticoagulants: Secondary | ICD-10-CM | POA: Diagnosis not present

## 2012-10-08 DIAGNOSIS — I495 Sick sinus syndrome: Secondary | ICD-10-CM | POA: Diagnosis not present

## 2012-10-09 DIAGNOSIS — Z7901 Long term (current) use of anticoagulants: Secondary | ICD-10-CM | POA: Diagnosis not present

## 2012-10-23 DIAGNOSIS — Z7901 Long term (current) use of anticoagulants: Secondary | ICD-10-CM | POA: Diagnosis not present

## 2012-11-12 DIAGNOSIS — I495 Sick sinus syndrome: Secondary | ICD-10-CM | POA: Diagnosis not present

## 2012-11-12 LAB — PACEMAKER DEVICE OBSERVATION

## 2012-11-13 DIAGNOSIS — Z7901 Long term (current) use of anticoagulants: Secondary | ICD-10-CM | POA: Diagnosis not present

## 2012-11-18 DIAGNOSIS — I10 Essential (primary) hypertension: Secondary | ICD-10-CM | POA: Diagnosis not present

## 2012-11-18 DIAGNOSIS — Z79899 Other long term (current) drug therapy: Secondary | ICD-10-CM | POA: Diagnosis not present

## 2012-11-18 DIAGNOSIS — I4891 Unspecified atrial fibrillation: Secondary | ICD-10-CM | POA: Diagnosis not present

## 2012-11-18 DIAGNOSIS — L821 Other seborrheic keratosis: Secondary | ICD-10-CM | POA: Diagnosis not present

## 2012-11-18 DIAGNOSIS — E7800 Pure hypercholesterolemia, unspecified: Secondary | ICD-10-CM | POA: Diagnosis not present

## 2012-12-09 ENCOUNTER — Encounter: Payer: Self-pay | Admitting: Cardiovascular Disease

## 2012-12-11 DIAGNOSIS — E7800 Pure hypercholesterolemia, unspecified: Secondary | ICD-10-CM | POA: Diagnosis not present

## 2012-12-11 DIAGNOSIS — Z7901 Long term (current) use of anticoagulants: Secondary | ICD-10-CM | POA: Diagnosis not present

## 2012-12-11 DIAGNOSIS — Z79899 Other long term (current) drug therapy: Secondary | ICD-10-CM | POA: Diagnosis not present

## 2012-12-11 DIAGNOSIS — I10 Essential (primary) hypertension: Secondary | ICD-10-CM | POA: Diagnosis not present

## 2012-12-24 DIAGNOSIS — I495 Sick sinus syndrome: Secondary | ICD-10-CM | POA: Diagnosis not present

## 2012-12-24 LAB — PACEMAKER DEVICE OBSERVATION

## 2012-12-25 DIAGNOSIS — Z7901 Long term (current) use of anticoagulants: Secondary | ICD-10-CM | POA: Diagnosis not present

## 2013-01-15 DIAGNOSIS — Z7901 Long term (current) use of anticoagulants: Secondary | ICD-10-CM | POA: Diagnosis not present

## 2013-01-26 ENCOUNTER — Other Ambulatory Visit: Payer: Self-pay | Admitting: Cardiovascular Disease

## 2013-01-26 ENCOUNTER — Encounter: Payer: Self-pay | Admitting: Cardiovascular Disease

## 2013-01-26 ENCOUNTER — Ambulatory Visit (INDEPENDENT_AMBULATORY_CARE_PROVIDER_SITE_OTHER): Payer: Medicare Other | Admitting: Cardiovascular Disease

## 2013-01-26 VITALS — BP 116/64 | HR 77 | Resp 20 | Ht 72.0 in | Wt 184.9 lb

## 2013-01-26 DIAGNOSIS — R0989 Other specified symptoms and signs involving the circulatory and respiratory systems: Secondary | ICD-10-CM | POA: Diagnosis not present

## 2013-01-26 DIAGNOSIS — R0600 Dyspnea, unspecified: Secondary | ICD-10-CM | POA: Insufficient documentation

## 2013-01-26 DIAGNOSIS — Z950 Presence of cardiac pacemaker: Secondary | ICD-10-CM | POA: Diagnosis not present

## 2013-01-26 DIAGNOSIS — I482 Chronic atrial fibrillation, unspecified: Secondary | ICD-10-CM

## 2013-01-26 DIAGNOSIS — I2510 Atherosclerotic heart disease of native coronary artery without angina pectoris: Secondary | ICD-10-CM

## 2013-01-26 DIAGNOSIS — I4891 Unspecified atrial fibrillation: Secondary | ICD-10-CM

## 2013-01-26 DIAGNOSIS — I441 Atrioventricular block, second degree: Secondary | ICD-10-CM | POA: Diagnosis not present

## 2013-01-26 DIAGNOSIS — I4821 Permanent atrial fibrillation: Secondary | ICD-10-CM | POA: Insufficient documentation

## 2013-01-26 DIAGNOSIS — R0609 Other forms of dyspnea: Secondary | ICD-10-CM | POA: Diagnosis not present

## 2013-01-26 LAB — PACEMAKER DEVICE OBSERVATION
BATTERY VOLTAGE: 2.79 V
RV LEAD IMPEDENCE PM: 476 Ohm
RV LEAD THRESHOLD: 0.5 V

## 2013-01-26 MED ORDER — METOPROLOL TARTRATE 25 MG PO TABS: 12.5000 mg | ORAL_TABLET | Freq: Two times a day (BID) | ORAL | Status: DC

## 2013-01-26 NOTE — Progress Notes (Signed)
In office pacemaker interrogation. Normal device function. No changes made this session. An effort will be made to promote intrinsic conduction via med changes.

## 2013-01-26 NOTE — Assessment & Plan Note (Signed)
Mrs. Garven has noticed a clear worsening of her husband's ability to tolerate walking. She's not sure whether this is because of deconditioning since he is less active or, vice versa, he is less active because he feels poorly. He has not had any assessment of left ventricular function since May of 2010. I've recommended that he have an echocardiogram. If LV systolic function has deteriorated this could explain the deterioration in exercise tolerance when we stopped his digoxin.

## 2013-01-26 NOTE — Assessment & Plan Note (Signed)
Previous records stated that he was in complete heart block and pacemaker dependent but he does appear to have slow AV conduction with a ultimate ventricular rate of 35-40 beats per minute. Pacing occurs 100% of the time.

## 2013-01-26 NOTE — Progress Notes (Signed)
Patient ID: [name] [name], male   DOB: 1926-06-20, 79 y.o.   MRN: [id number]     Reason for office visit Followup atrial fibrillation and pacemaker check  The patient has coronary disease and is now roughly 4 years status post bypass surgery and is not troubled by angina. He has permanent atrial fibrillation and his dual chamber permanent pacemaker is programmed VVIR. Initially labeled as having complete heart block, he actually appears to have high-grade second-degree atrioventricular block and does have some conduction of his atrial fibrillation to the ventricle when the pacemaker is turned off.   We tried to promote native AV conduction by discontinuing his digoxin. Interrogation of his pacemaker today shows that he continues to have 100% ventricular pacing. Considering his sedentary status the heart rate distribution hysterogram appears satisfactory.  In addition Mrs. Blome has noticed that his tolerance exercise has diminished, and this appeared to coincide with discontinuation of the digoxin. It is harder to get him to get out of the chair and go for walks. She's not sure whether his dyspnea is related to deconditioning or whether he is avoiding exercise because he feels unwell. He now has evidence of dyspnea simply walking from one room to the other. He has clear evidence of short-term memory problem and the review of systems is mostly obtained from Mrs. Stogsdill.    Allergies  Allergen Reactions  . Amiodarone Other (See Comments)    Had a severe lung infection in 2003  . Antihistamines, Diphenhydramine-Type Other (See Comments)    Jittery    Current Outpatient Prescriptions  Medication Sig Dispense Refill  . Cholecalciferol (VITAMIN D) 2000 UNITS CAPS Take 1 capsule by mouth daily.      [name] donepezil (ARICEPT) 5 MG tablet Take 5 mg by mouth every morning.      . finasteride (PROSCAR) 5 MG tablet Take 5 mg by mouth daily.      [name] lisinopril (PRINIVIL,ZESTRIL) 5 MG tablet Take 5 mg by  mouth daily.      . metoprolol tartrate (LOPRESSOR) 25 MG tablet Take 0.5 tablets (12.5 mg total) by mouth 2 (two) times daily.  15 tablet  6  . Multiple Vitamin (MULTIVITAMIN) tablet Take 1 tablet by mouth daily.      [name] omeprazole (PRILOSEC) 20 MG capsule Take 20 mg by mouth daily.      [name] warfarin (COUMADIN) 4 MG tablet Take 4 mg by mouth daily.       No current facility-administered medications for this visit.    No past medical history on file.  No past surgical history on file.  No family history on file.  History   Social History  . Marital Status: Married    Spouse Name: N/A    Number of Children: N/A  . Years of Education: N/A   Occupational History  . Not on file.   Social History Main Topics  . Smoking status: Former Smoker    Quit date: 07/02/1974  . Smokeless tobacco: Never Used  . Alcohol Use: Yes     Comment: 2-3 "stiff" drinks per day  . Drug Use: No  . Sexually Active: Not on file   Other Topics Concern  . Not on file   Social History Narrative  . No narrative on file    Review of systems: The patient specifically denies any chest pain at rest or with exertion, dyspnea at rest, orthopnea, paroxysmal nocturnal dyspnea, syncope, palpitations, focal neurological deficits, intermittent claudication, lower extremity edema, unexplained weight  gain, cough, hemoptysis or wheezing.  The patient also denies abdominal pain, nausea, vomiting, dysphagia, diarrhea, constipation, polyuria, polydipsia, dysuria, hematuria, frequency, urgency, abnormal bleeding or bruising, fever, chills, unexpected weight changes, mood swings, change in skin or hair texture, change in voice quality, auditory or visual problems, allergic reactions or rashes, new musculoskeletal complaints other than usual "aches and pains".   PHYSICAL EXAM BP 116/64  Pulse 77  Ht 6' (1.829 m)  Wt 184 lb 14.4 oz (83.87 kg)  BMI 25.07 kg/m2  General: Alert, oriented x3, no distress Head: no evidence  of trauma, PERRL, EOMI, no exophtalmos or lid lag, no myxedema, no xanthelasma; normal ears, nose and oropharynx Neck: normal jugular venous pulsations and no hepatojugular reflux; brisk carotid pulses without delay and no carotid bruits Chest: clear to auscultation, no signs of consolidation by percussion or palpation, normal fremitus, symmetrical and full respiratory excursions; healthy right subclavian pacemaker site Cardiovascular: normal position and quality of the apical impulse, regular rhythm, normal first and paradoxically split second heart sounds, no murmurs, rubs or gallops Abdomen: no tenderness or distention, no masses by palpation, no abnormal pulsatility or arterial bruits, normal bowel sounds, no hepatosplenomegaly Extremities: no clubbing, cyanosis ; reveals symmetrical ankle edema, 1+; pulses:2+ radial, ulnar and brachial pulses bilaterally; 2+ right femoral, posterior tibial and dorsalis pedis pulses; 2+ left femoral, posterior tibial and dorsalis pedis pulses; no subclavian or femoral bruits Neurological: grossly nonfocal   EKG: Background atrial fibrillation, 100% ventricular paced  Lipid Panel  No results found for this basename: chol, trig, hdl, cholhdl, vldl, ldlcalc    BMET Creatinine 0.9, BUN 19, potassium 4.0, normal liver function tests (12/11/2012)  LIPID PROFILE Total cholesterol 128, triglycerides 77, HDL 46, LDL 74 (12/11/2012)   ASSESSMENT AND PLAN Pacemaker [employer] XL DR [phone], dual chamber device implanted July 2009 (generator change, initial pacemaker and leads implanted in 2001), programmed  VVIR for permanent atrial fibrillation. He has 100% ventricular pacing with a reasonable heart rate distribution by histogram considering his sedentary status. Battery voltage 2.79 V, estimated longevity 8-10 years. R waves 4.6-5.0 mV, threshold 0.5 V at 0.5 ms pulse width, impedance 476 ohms. Normal device function. No changes are made to device  settings.  Second degree AV block, Mobitz type II Previous records stated that he was in complete heart block and pacemaker dependent but he does appear to have slow AV conduction with a ultimate ventricular rate of 35-40 beats per minute. Pacing occurs 100% of the time.  Atrial fibrillation with slow ventricular response Despite the Maze procedure at the time of surgery he is now on permanent atrial fibrillation. There appears to be very slow AV conduction. Stopping the digoxin lead to deterioration clinically. Will restart the digoxin and try to cut back on his beta blocker.  Dyspnea Mrs. Schoenrock has noticed a clear worsening of her husband's ability to tolerate walking. She's not sure whether this is because of deconditioning since he is less active or, vice versa, he is less active because he feels poorly. He has not had any assessment of left ventricular function since May of 2010. I've recommended that he have an echocardiogram. If LV systolic function has deteriorated this could explain the deterioration in exercise tolerance when we stopped his digoxin.  CAD (coronary artery disease) Acute myocardial infarction 1997, CABG June 2010 (Dr. [name], LIMA to LAD, SVG to distal circumflex, SVG to distal RCA) with intraoperative left-sided maze and left atrial appendage ligation. Notes moderate inferior wall scar  by nuclear stress testing 2010, anterior and apical hypokinesis with EF of 50-55% by echo May 2010. He does not have angina. He is still relatively early following bypass surgery, at this point I'm not sure we need to worry about reevaluation of coronary status. If his echo shows worsening LVEF he should have a minimum of a nuclear perfusion study.  Orders Placed This Encounter  Procedures  . EKG 12-Lead  . 2D Echocardiogram with contrast   Meds ordered this encounter  Medications  . donepezil (ARICEPT) 5 MG tablet    Sig: Take 5 mg by mouth every morning.  [name] omeprazole (PRILOSEC) 20  MG capsule    Sig: Take 20 mg by mouth daily.  [name] lisinopril (PRINIVIL,ZESTRIL) 5 MG tablet    Sig: Take 5 mg by mouth daily.  [name] DISCONTD: metoprolol tartrate (LOPRESSOR) 25 MG tablet    Sig: Take 25 mg by mouth 2 (two) times daily.  . Multiple Vitamin (MULTIVITAMIN) tablet    Sig: Take 1 tablet by mouth daily.  . Cholecalciferol (VITAMIN D) 2000 UNITS CAPS    Sig: Take 1 capsule by mouth daily.  [name] warfarin (COUMADIN) 4 MG tablet    Sig: Take 4 mg by mouth daily.  . finasteride (PROSCAR) 5 MG tablet    Sig: Take 5 mg by mouth daily.  . metoprolol tartrate (LOPRESSOR) 25 MG tablet    Sig: Take 0.5 tablets (12.5 mg total) by mouth 2 (two) times daily.    Dispense:  15 tablet    Refill:  [place] Makeshia Seat, MD, [place] [phone] office [phone] pager

## 2013-01-26 NOTE — Assessment & Plan Note (Addendum)
[place] [phone], dual chamber device implanted July 2009 (generator change, initial pacemaker and leads implanted in 2001), programmed  VVIR for permanent atrial fibrillation. He has 100% ventricular pacing with a reasonable heart rate distribution by histogram considering his sedentary status. Battery voltage 2.79 V, estimated longevity 8-10 years. R waves 4.6-5.0 mV, threshold 0.5 V at 0.5 ms pulse width, impedance 476 ohms. Normal device function. No changes are made to device settings.

## 2013-01-26 NOTE — Patient Instructions (Signed)
Your physician has recommended you make the following change in your medication: Reduce metoprolol tartrate to 12.5 mg twice a day (half a tablet twice a day) Your physician has requested that you have an echocardiogram. Echocardiography is a painless test that uses sound waves to create images of your heart. It provides your doctor with information about the size and shape of your heart and how well your heart's chambers and valves are working. This procedure takes approximately one hour. There are no restrictions for this procedure. Your physician recommends that you schedule a follow-up appointment in: 3 months

## 2013-01-26 NOTE — Assessment & Plan Note (Signed)
Acute myocardial infarction 1997, CABG June 2010 (Dr. [name], LIMA to LAD, SVG to distal circumflex, SVG to distal RCA) with intraoperative left-sided maze and left atrial appendage ligation. Notes moderate inferior wall scar by nuclear stress testing 2010, anterior and apical hypokinesis with EF of 50-55% by echo May 2010. He does not have angina. He is still relatively early following bypass surgery, at this point I'm not sure we need to worry about reevaluation of coronary status. If his echo shows worsening LVEF he should have a minimum of a nuclear perfusion study.

## 2013-01-26 NOTE — Assessment & Plan Note (Addendum)
Despite the Maze procedure at the time of surgery he is now on permanent atrial fibrillation. There appears to be very slow AV conduction. Stopping the digoxin lead to deterioration clinically. Will restart the digoxin and try to cut back on his beta blocker.

## 2013-01-28 DIAGNOSIS — I495 Sick sinus syndrome: Secondary | ICD-10-CM | POA: Diagnosis not present

## 2013-01-28 LAB — PACEMAKER DEVICE OBSERVATION

## 2013-01-29 DIAGNOSIS — Z7901 Long term (current) use of anticoagulants: Secondary | ICD-10-CM | POA: Diagnosis not present

## 2013-02-05 ENCOUNTER — Encounter: Payer: Self-pay | Admitting: Geriatric Medicine

## 2013-02-09 DIAGNOSIS — Z7901 Long term (current) use of anticoagulants: Secondary | ICD-10-CM | POA: Diagnosis not present

## 2013-02-10 ENCOUNTER — Ambulatory Visit (HOSPITAL_COMMUNITY)
Admission: RE | Admit: 2013-02-10 | Discharge: 2013-02-10 | Disposition: A | Discharge status: Home / Self Care | Payer: Medicare Other | Source: Ambulatory Visit | Attending: Cardiovascular Disease | Admitting: Cardiovascular Disease

## 2013-02-10 DIAGNOSIS — I517 Cardiomegaly: Secondary | ICD-10-CM | POA: Insufficient documentation

## 2013-02-10 DIAGNOSIS — I2510 Atherosclerotic heart disease of native coronary artery without angina pectoris: Secondary | ICD-10-CM | POA: Insufficient documentation

## 2013-02-10 DIAGNOSIS — I482 Chronic atrial fibrillation, unspecified: Secondary | ICD-10-CM

## 2013-02-10 DIAGNOSIS — I079 Rheumatic tricuspid valve disease, unspecified: Secondary | ICD-10-CM | POA: Diagnosis not present

## 2013-02-10 DIAGNOSIS — R0609 Other forms of dyspnea: Secondary | ICD-10-CM | POA: Diagnosis not present

## 2013-02-10 DIAGNOSIS — I4891 Unspecified atrial fibrillation: Secondary | ICD-10-CM | POA: Diagnosis not present

## 2013-02-10 DIAGNOSIS — I059 Rheumatic mitral valve disease, unspecified: Secondary | ICD-10-CM | POA: Insufficient documentation

## 2013-02-10 DIAGNOSIS — R0989 Other specified symptoms and signs involving the circulatory and respiratory systems: Secondary | ICD-10-CM | POA: Insufficient documentation

## 2013-02-10 DIAGNOSIS — R0602 Shortness of breath: Secondary | ICD-10-CM

## 2013-02-10 NOTE — Progress Notes (Signed)
2D Echo Performed 02/10/2013    Marygrace Drought, RCS

## 2013-02-11 ENCOUNTER — Encounter: Payer: Self-pay | Admitting: Cardiovascular Disease

## 2013-02-11 ENCOUNTER — Telehealth: Payer: Self-pay | Admitting: *Deleted

## 2013-02-11 DIAGNOSIS — I2510 Atherosclerotic heart disease of native coronary artery without angina pectoris: Secondary | ICD-10-CM

## 2013-02-11 NOTE — Telephone Encounter (Signed)
Patient notified of echo results.  Dr. Vertell Limber myoview due to reduced LVEF.  Pt voiced understanding.  Order placed and paperwork sent to schedulers.

## 2013-02-11 NOTE — Progress Notes (Signed)
Results called to pt and ordered [facility]

## 2013-02-11 NOTE — Telephone Encounter (Signed)
Message copied by Tressa Busman on Wed Feb 11, 2013  1:30 PM ------      Message from: [name]      Created: Tue Feb 10, 2013  6:09 PM       LVEF is slightly worse (45%-50%). Recommend Lexiscan Myoview (treadmill not appropriate with paced rhythm). ------

## 2013-02-17 ENCOUNTER — Telehealth: Payer: Self-pay | Admitting: Cardiovascular Disease

## 2013-02-17 NOTE — Telephone Encounter (Signed)
Pt's wife was returning a call bc her husband has dementia and he stated that someone called regarding his test tomorrow. She just wanted to make sure about his medications and wanted to make sure that someone did call him.

## 2013-02-17 NOTE — Telephone Encounter (Signed)
Note sent to nuclear medicine

## 2013-02-18 ENCOUNTER — Ambulatory Visit (HOSPITAL_COMMUNITY)
Admission: RE | Admit: 2013-02-18 | Discharge: 2013-02-18 | Disposition: A | Discharge status: Home / Self Care | Payer: Medicare Other | Source: Ambulatory Visit | Attending: Cardiovascular Disease | Admitting: Cardiovascular Disease

## 2013-02-18 DIAGNOSIS — J438 Other emphysema: Secondary | ICD-10-CM | POA: Diagnosis not present

## 2013-02-18 DIAGNOSIS — I252 Old myocardial infarction: Secondary | ICD-10-CM | POA: Insufficient documentation

## 2013-02-18 DIAGNOSIS — R9431 Abnormal electrocardiogram [ECG] [EKG]: Secondary | ICD-10-CM | POA: Diagnosis not present

## 2013-02-18 DIAGNOSIS — R0609 Other forms of dyspnea: Secondary | ICD-10-CM | POA: Insufficient documentation

## 2013-02-18 DIAGNOSIS — E669 Obesity, unspecified: Secondary | ICD-10-CM | POA: Insufficient documentation

## 2013-02-18 DIAGNOSIS — I2510 Atherosclerotic heart disease of native coronary artery without angina pectoris: Secondary | ICD-10-CM

## 2013-02-18 DIAGNOSIS — R0602 Shortness of breath: Secondary | ICD-10-CM | POA: Insufficient documentation

## 2013-02-18 DIAGNOSIS — I4891 Unspecified atrial fibrillation: Secondary | ICD-10-CM | POA: Diagnosis not present

## 2013-02-18 DIAGNOSIS — R0989 Other specified symptoms and signs involving the circulatory and respiratory systems: Secondary | ICD-10-CM | POA: Insufficient documentation

## 2013-02-18 MED ORDER — REGADENOSON 0.4 MG/5ML IV SOLN
0.4000 mg | Freq: Once | INTRAVENOUS | Status: AC
  Administered 2013-02-18: 0.4 mg via INTRAVENOUS

## 2013-02-18 MED ORDER — TECHNETIUM TC 99M SESTAMIBI GENERIC - CARDIOLITE
10.8000 | Freq: Once | INTRAVENOUS | Status: AC | PRN
  Administered 2013-02-18: 11 via INTRAVENOUS

## 2013-02-18 MED ORDER — TECHNETIUM TC 99M SESTAMIBI GENERIC - CARDIOLITE
31.0000 | Freq: Once | INTRAVENOUS | Status: AC | PRN
  Administered 2013-02-18: 31 via INTRAVENOUS

## 2013-02-18 NOTE — Procedures (Addendum)
[facility] [address] [place] [address] [id number]  Cardiology Nuclear Med Study  [name] [name] is a 79 y.o. male     MRN : [id number]     DOB: [birth year]  Procedure Date: 02/18/2013  Nuclear Med Background Indication for Stress Test:  Evaluation for Ischemia, Graft Patency and Abnormal EKG History:  Emphysema and CAD;MI-1997;CABG X3--11/2008;PACER Cardiac Risk Factors: Family History - CAD, History of Smoking, Hypertension, Lipids, Obesity and A-FIB;2ND AVB;MOBITZ II  Symptoms:  DOE, Fatigue and SOB   Nuclear Pre-Procedure Caffeine/Decaff Intake:  7:00pm NPO After: 5:00am   IV Site: R Forearm  IV 0.9% NS with Angio Cath:  22g  Chest Size (in):  42"  IV Started by: Azucena Cecil, RN  Height: 6' (1.829 m)  Cup Size: n/a  BMI:  Body mass index is 24.95 kg/(m^2). Weight:  184 lb (83.462 kg)   Tech Comments:  N/A    Nuclear Med Study 1 or 2 day study: 1 day  Stress Test Type:  [place] Provider:  [name], MD   Resting Radionuclide: Technetium 23m Sestamibi  Resting Radionuclide Dose: 10.8 mCi   Stress Radionuclide:  Technetium 7m Sestamibi  Stress Radionuclide Dose: 31.0 mCi           Stress Protocol Rest HR: 69 Stress HR: 70  Rest BP:146/94 Stress BP: 150/93  Exercise Time (min): n/a METS: n/a          Dose of Adenosine (mg):  n/a Dose of Lexiscan: 0.4 mg  Dose of Atropine (mg): n/a Dose of Dobutamine: n/a mcg/kg/min (at max HR)  Stress Test Technologist: Mellody Memos, CCT Nuclear Technologist: Imagene Riches, CNMT   Rest Procedure:  Myocardial perfusion imaging was performed at rest 45 minutes following the intravenous administration of Technetium 7m Sestamibi. Stress Procedure:  The patient received IV Lexiscan 0.4 mg over 15-seconds.  Technetium 49m Sestamibi injected at 30-seconds.  There were no significant changes with Lexiscan.  Quantitative spect images were obtained  after a 45 minute delay.  Transient Ischemic Dilatation (Normal <1.22):  1.04 Lung/Heart Ratio (Normal <0.45):  0.37 QGS EDV:  119 ml QGS ESV:  58 ml LV Ejection Fraction: 51%  Rest ECG: V-paced rhythm  Stress ECG: No significant change from baseline ECG  QPS Raw Data Images:  Normal; no motion artifact; normal heart/lung ratio. Stress Images:  Fixed inferolateral scar Rest Images:  Fixed inferolateral scar Subtraction (SDS):  No evidence of ischemia.  Impression Exercise Capacity:  Lexiscan with no exercise. BP Response:  Hypotensive blood pressure response. Clinical Symptoms:  There is dyspnea. ECG Impression:  EKG was paced Comparison with Prior Nuclear Study: No significant change from previous study  Overall Impression:  Intermediate risk stress nuclear study with fixed inferolateral defect consistent with scar. No significant reversible ischemia.  LV Wall Motion:  LVEF 33%, inferolateral hypokinesis.  Pixie Casino, MD, [facility] Board Certified in Nuclear Cardiology Attending Cardiologist The Waggoner, MD  02/19/2013 1:30 PM

## 2013-02-23 DIAGNOSIS — Z7901 Long term (current) use of anticoagulants: Secondary | ICD-10-CM | POA: Diagnosis not present

## 2013-02-25 DIAGNOSIS — I495 Sick sinus syndrome: Secondary | ICD-10-CM | POA: Diagnosis not present

## 2013-02-25 LAB — PACEMAKER DEVICE OBSERVATION

## 2013-03-12 DIAGNOSIS — Z7901 Long term (current) use of anticoagulants: Secondary | ICD-10-CM | POA: Diagnosis not present

## 2013-03-13 DIAGNOSIS — Z23 Encounter for immunization: Secondary | ICD-10-CM | POA: Diagnosis not present

## 2013-03-23 DIAGNOSIS — I495 Sick sinus syndrome: Secondary | ICD-10-CM | POA: Diagnosis not present

## 2013-03-23 LAB — PACEMAKER DEVICE OBSERVATION

## 2013-04-02 DIAGNOSIS — H52209 Unspecified astigmatism, unspecified eye: Secondary | ICD-10-CM | POA: Diagnosis not present

## 2013-04-02 DIAGNOSIS — Z961 Presence of intraocular lens: Secondary | ICD-10-CM | POA: Diagnosis not present

## 2013-04-02 DIAGNOSIS — H02409 Unspecified ptosis of unspecified eyelid: Secondary | ICD-10-CM | POA: Diagnosis not present

## 2013-04-09 DIAGNOSIS — Z7901 Long term (current) use of anticoagulants: Secondary | ICD-10-CM | POA: Diagnosis not present

## 2013-04-22 DIAGNOSIS — I495 Sick sinus syndrome: Secondary | ICD-10-CM | POA: Diagnosis not present

## 2013-04-28 ENCOUNTER — Ambulatory Visit (INDEPENDENT_AMBULATORY_CARE_PROVIDER_SITE_OTHER): Payer: Medicare Other | Admitting: Cardiovascular Disease

## 2013-04-28 ENCOUNTER — Encounter: Payer: Self-pay | Admitting: Cardiovascular Disease

## 2013-04-28 VITALS — BP 118/66 | HR 76 | Ht 72.0 in | Wt 179.0 lb

## 2013-04-28 DIAGNOSIS — I4891 Unspecified atrial fibrillation: Secondary | ICD-10-CM | POA: Diagnosis not present

## 2013-04-28 DIAGNOSIS — Z950 Presence of cardiac pacemaker: Secondary | ICD-10-CM | POA: Diagnosis not present

## 2013-04-28 DIAGNOSIS — I2510 Atherosclerotic heart disease of native coronary artery without angina pectoris: Secondary | ICD-10-CM | POA: Diagnosis not present

## 2013-04-28 LAB — PACEMAKER DEVICE OBSERVATION
BATTERY VOLTAGE: 2.79 V
RV LEAD THRESHOLD: 0.375 V
VENTRICULAR PACING PM: 99

## 2013-04-28 NOTE — Progress Notes (Signed)
Patient ID: [name] [name], male   DOB: [birth year], 79 y.o.   MRN: [id number]      Reason for office visit Followup CAD, atrial fibrillation, pacemaker  [name] [name] is an 79 year old gentleman with progressive parents is now moderate) dementia accompanied as always by his wife. He has a dual-chamber permanent pacemaker that is now programmed VVIR for permanent atrial fibrillation. He paces 100% of the time but does have a slow underlying escape rhythm. It has been roughly 4 years since his bypass surgery and he has not had any new coronary event since that time. A nuclear stress test was performed in August of this year shows a dense inferolateral scar. The formal report states that the ejection fraction was 33% which would be a significant drop, but when I reviewed the actual images the ejection fraction is actually 51%, comparable to previous studies. Clinically, he seems to fit with the higher ejection fraction. He has no complaints of shortness of breath at rest or with activity. He is also free of any angina. He has not had any focal neurological events to suggest embolic TIA or stroke and has not had any bleeding complications on warfarin. He is steady on his feet without any falls or injuries. On the other hand his walking has reduced in frequency, because he doesn't have a walking "partner".   Allergies  Allergen Reactions  . Amiodarone Other (See Comments)    Had a severe lung infection in 2003  . Antihistamines, Diphenhydramine-Type Other (See Comments)    Jittery    Current Outpatient Prescriptions  Medication Sig Dispense Refill  . atorvastatin (LIPITOR) 40 MG tablet Take 40 mg by mouth daily.      . Cholecalciferol (VITAMIN D) 2000 UNITS CAPS Take 1 capsule by mouth daily.      . digoxin (LANOXIN) 0.125 MG tablet Take 0.125 mg by mouth daily.      [name] donepezil (ARICEPT) 5 MG tablet Take 5 mg by mouth every morning.      . finasteride (PROSCAR) 5 MG tablet Take 5 mg by mouth daily.       [name] lisinopril (PRINIVIL,ZESTRIL) 5 MG tablet Take 5 mg by mouth daily.      . metoprolol tartrate (LOPRESSOR) 25 MG tablet Take 0.5 tablets (12.5 mg total) by mouth 2 (two) times daily.  15 tablet  6  . Multiple Vitamin (MULTIVITAMIN) tablet Take 1 tablet by mouth daily.      [name] omeprazole (PRILOSEC) 20 MG capsule Take 20 mg by mouth daily.      [name] warfarin (COUMADIN) 4 MG tablet Take 4 mg by mouth daily.      [name] zolpidem (AMBIEN) 5 MG tablet Take 5 mg by mouth at bedtime as needed for sleep.       No current facility-administered medications for this visit.    No past medical history on file.  No past surgical history on file.  No family history on file.  History   Social History  . Marital Status: Married    Spouse Name: N/A    Number of Children: N/A  . Years of Education: N/A   Occupational History  . Not on file.   Social History Main Topics  . Smoking status: Former Smoker    Quit date: 07/02/1974  . Smokeless tobacco: Never Used  . Alcohol Use: Yes     Comment: 2-3 "stiff" drinks per day  . Drug Use: No  . Sexual Activity: Not on file  Other Topics Concern  . Not on file   Social History Narrative  . No narrative on file    Review of systems: The patient specifically denies any chest pain at rest or with exertion, dyspnea at rest or with exertion, orthopnea, paroxysmal nocturnal dyspnea, syncope, palpitations, focal neurological deficits, intermittent claudication, lower extremity edema, unexplained weight gain, cough, hemoptysis or wheezing.  The patient also denies abdominal pain, nausea, vomiting, dysphagia, diarrhea, constipation, polyuria, polydipsia, dysuria, hematuria, frequency, urgency, abnormal bleeding or bruising, fever, chills, unexpected weight changes, mood swings, change in skin or hair texture, change in voice quality, auditory or visual problems, allergic reactions or rashes, new musculoskeletal complaints other than usual "aches and  pains".   PHYSICAL EXAM BP 118/66  Pulse 76  Ht 6' (1.829 m)  Wt 179 lb (81.194 kg)  BMI 24.27 kg/m2  General: Alert, oriented x3, no distress Head: no evidence of trauma, PERRL, EOMI, no exophtalmos or lid lag, no myxedema, no xanthelasma; normal ears, nose and oropharynx Neck: normal jugular venous pulsations and no hepatojugular reflux; brisk carotid pulses without delay and no carotid bruits Chest: clear to auscultation, no signs of consolidation by percussion or palpation, normal fremitus, symmetrical and full respiratory excursions; the sternotomy scar and the right subclavian pacemaker site both appear healthy Cardiovascular: normal position and quality of the apical impulse, regular rhythm, normal first and paradoxically split second heart sounds, no murmurs, rubs or gallops Abdomen: no tenderness or distention, no masses by palpation, no abnormal pulsatility or arterial bruits, normal bowel sounds, no hepatosplenomegaly Extremities: no clubbing, cyanosis or edema; 2+ radial, ulnar and brachial pulses bilaterally; 2+ right femoral, posterior tibial and dorsalis pedis pulses; 2+ left femoral, posterior tibial and dorsalis pedis pulses; no subclavian or femoral bruits Neurological: grossly nonfocal   EKG: Atrial fibrillation, ventricular paced  Pacemaker check shows no episodes of high ventricular rates, satisfactory sensing and pacing parameters  Lipid Panel  No results found for this basename: chol, trig, hdl, cholhdl, vldl, ldlcalc    BMET    Component Value Date/Time   NA 138 11/24/2009 0416   K 3.8 11/24/2009 0416   CL 107 11/24/2009 0416   CO2 27 11/24/2009 0416   GLUCOSE 92 11/24/2009 0416   BUN 6 11/24/2009 0416   CREATININE 1.03 11/24/2009 0416   CALCIUM 8.3* 11/24/2009 0416   GFRNONAA >60 11/24/2009 0416   GFRAA  Value: >60        The eGFR has been calculated using the MDRD equation. This calculation has not been validated in all clinical situations. eGFR's persistently  <60 mL/min signify possible Chronic Kidney Disease. 11/24/2009 0416     ASSESSMENT AND PLAN Atrial fibrillation with slow ventricular response I had stopped his digoxin at the previous appointment, but he resumed it since he appears to feel better with it. I did not make any further adjustments today. Whether or not he takes digoxin he still appears to have a slowing of ventricular rate that he requires 100% ventricular pacing. He should remain on lifelong warfarin anticoagulation as long as he does not have bleeding complication or frequent falls.  CAD (coronary artery disease) Acute myocardial infarction 1997, CABG June 2010 (Dr. [name], LIMA to LAD, SVG to distal circumflex, SVG to distal RCA) with intraoperative left-sided maze and left atrial appendage ligation. Note unchanged inferolateral wall scar by nuclear stress testing 2014, anterior and apical hypokinesis with EF of 50-55% by echo May 2010, 51% by nuclear scintigraphy. Asymptomatic.  Pacemaker [place]  XL DR [id number], dual chamber device implanted July 2009 (generator change, initial pacemaker and leads implanted in 2001), programmed  VVIR for permanent atrial fibrillation. Normal device function by comprehensive in office check today, repeat check in 6 months.   encourage him to stay physically active and walk on a regular basis to maintain steady balance and gait. Meds ordered this encounter  Medications  . digoxin (LANOXIN) 0.125 MG tablet    Sig: Take 0.125 mg by mouth daily.  [name] atorvastatin (LIPITOR) 40 MG tablet    Sig: Take 40 mg by mouth daily.  [name] zolpidem (AMBIEN) 5 MG tablet    Sig: Take 5 mg by mouth at bedtime as needed for sleep.    Holli Humbles, MD, [place] [phone] office [phone] pager

## 2013-04-28 NOTE — Patient Instructions (Signed)
Your physician recommends that you schedule a follow-up appointment on 10-28-2013 @ 9:30am at the [facility] office for a pacemaker check, and in 12 months with MD.

## 2013-04-28 NOTE — Assessment & Plan Note (Signed)
I had stopped his digoxin at the previous appointment, but he resumed it since he appears to feel better with it. I did not make any further adjustments today. Whether or not he takes digoxin he still appears to have a slowing of ventricular rate that he requires 100% ventricular pacing. He should remain on lifelong warfarin anticoagulation as long as he does not have bleeding complication or frequent falls.

## 2013-04-28 NOTE — Assessment & Plan Note (Signed)
[place] [phone], dual chamber device implanted July 2009 (generator change, initial pacemaker and leads implanted in 2001), programmed  VVIR for permanent atrial fibrillation. Normal device function by comprehensive in office check today, repeat check in 6 months.

## 2013-04-28 NOTE — Assessment & Plan Note (Signed)
Acute myocardial infarction 1997, CABG June 2010 (Dr. [name], LIMA to LAD, SVG to distal circumflex, SVG to distal RCA) with intraoperative left-sided maze and left atrial appendage ligation. Note unchanged inferolateral wall scar by nuclear stress testing 2014, anterior and apical hypokinesis with EF of 50-55% by echo May 2010, 51% by nuclear scintigraphy. Asymptomatic.

## 2013-04-30 DIAGNOSIS — Z7901 Long term (current) use of anticoagulants: Secondary | ICD-10-CM | POA: Diagnosis not present

## 2013-04-30 DIAGNOSIS — I4891 Unspecified atrial fibrillation: Secondary | ICD-10-CM | POA: Diagnosis not present

## 2013-05-20 LAB — PACEMAKER DEVICE OBSERVATION

## 2013-05-21 DIAGNOSIS — Z7901 Long term (current) use of anticoagulants: Secondary | ICD-10-CM | POA: Diagnosis not present

## 2013-05-21 DIAGNOSIS — I4891 Unspecified atrial fibrillation: Secondary | ICD-10-CM | POA: Diagnosis not present

## 2013-06-02 DIAGNOSIS — E7800 Pure hypercholesterolemia, unspecified: Secondary | ICD-10-CM | POA: Diagnosis not present

## 2013-06-02 DIAGNOSIS — Z79899 Other long term (current) drug therapy: Secondary | ICD-10-CM | POA: Diagnosis not present

## 2013-06-02 DIAGNOSIS — Z0000 Encounter for general adult medical examination without abnormal findings: Secondary | ICD-10-CM | POA: Diagnosis not present

## 2013-06-02 DIAGNOSIS — I10 Essential (primary) hypertension: Secondary | ICD-10-CM | POA: Diagnosis not present

## 2013-06-02 DIAGNOSIS — Z1331 Encounter for screening for depression: Secondary | ICD-10-CM | POA: Diagnosis not present

## 2013-06-17 LAB — PACEMAKER DEVICE OBSERVATION

## 2013-06-18 DIAGNOSIS — Z7901 Long term (current) use of anticoagulants: Secondary | ICD-10-CM | POA: Diagnosis not present

## 2013-06-18 DIAGNOSIS — I4891 Unspecified atrial fibrillation: Secondary | ICD-10-CM | POA: Diagnosis not present

## 2013-07-09 DIAGNOSIS — Z8679 Personal history of other diseases of the circulatory system: Secondary | ICD-10-CM | POA: Diagnosis not present

## 2013-07-09 DIAGNOSIS — Z7901 Long term (current) use of anticoagulants: Secondary | ICD-10-CM | POA: Diagnosis not present

## 2013-07-13 ENCOUNTER — Encounter: Payer: Self-pay | Admitting: Neurology

## 2013-07-15 DIAGNOSIS — I495 Sick sinus syndrome: Secondary | ICD-10-CM | POA: Diagnosis not present

## 2013-07-20 ENCOUNTER — Other Ambulatory Visit: Payer: Self-pay | Admitting: Nurse Practitioner

## 2013-07-20 ENCOUNTER — Ambulatory Visit
Admission: RE | Admit: 2013-07-20 | Discharge: 2013-07-20 | Disposition: A | Discharge status: Home / Self Care | Payer: Medicare Other | Source: Ambulatory Visit | Attending: Nurse Practitioner | Admitting: Nurse Practitioner

## 2013-07-20 DIAGNOSIS — M549 Dorsalgia, unspecified: Secondary | ICD-10-CM

## 2013-07-20 DIAGNOSIS — S2249XA Multiple fractures of ribs, unspecified side, initial encounter for closed fracture: Secondary | ICD-10-CM | POA: Diagnosis not present

## 2013-07-20 DIAGNOSIS — W1809XA Striking against other object with subsequent fall, initial encounter: Secondary | ICD-10-CM

## 2013-07-20 DIAGNOSIS — I4891 Unspecified atrial fibrillation: Secondary | ICD-10-CM | POA: Diagnosis not present

## 2013-07-27 DIAGNOSIS — M549 Dorsalgia, unspecified: Secondary | ICD-10-CM | POA: Diagnosis not present

## 2013-07-28 ENCOUNTER — Encounter (INDEPENDENT_AMBULATORY_CARE_PROVIDER_SITE_OTHER): Payer: Self-pay

## 2013-07-28 ENCOUNTER — Ambulatory Visit (INDEPENDENT_AMBULATORY_CARE_PROVIDER_SITE_OTHER): Payer: Medicare Other | Admitting: Neurology

## 2013-07-28 ENCOUNTER — Encounter: Payer: Self-pay | Admitting: Neurology

## 2013-07-28 VITALS — BP 146/80 | HR 72 | Resp 18 | Ht 67.5 in | Wt 173.0 lb

## 2013-07-28 DIAGNOSIS — F0390 Unspecified dementia without behavioral disturbance: Secondary | ICD-10-CM

## 2013-07-28 DIAGNOSIS — F03918 Unspecified dementia, unspecified severity, with other behavioral disturbance: Secondary | ICD-10-CM | POA: Diagnosis not present

## 2013-07-28 DIAGNOSIS — F068 Other specified mental disorders due to known physiological condition: Secondary | ICD-10-CM | POA: Diagnosis not present

## 2013-07-28 DIAGNOSIS — F0391 Unspecified dementia with behavioral disturbance: Secondary | ICD-10-CM | POA: Diagnosis not present

## 2013-07-28 MED ORDER — DONEPEZIL HCL 5 MG PO TABS: 10.0000 mg | ORAL_TABLET | ORAL | Status: DC

## 2013-07-28 NOTE — Progress Notes (Signed)
[facility]  Provider:  Larey Seat, M D  Referring Provider: [name], MD Primary Care Physician:  Mathews Argyle, MD  Chief Complaint  Patient presents with  . Follow-up    room 11  . Memory Loss    MOCA- 18/30    HPI:  [name] [name] is a 80 y.o. male  Is seen here as a referral/ revisit  from [name] for dementia-  [name] has been followed in this office originally by Dr. [name] Mince. He was referred in July 2008 for memory loss presumed to reflect early Alzheimer's disease.  However his initial M.M SE score was 29 or 30,  he had a normal head CT at that time , thyroid-stimulating hormone was in normal range,  his B12 level was borderline. He used to live in [place] and was able to hld a conversation in [place] and [place]. Small talk.  He  had made some changes to his alcohol intake;  these lifestyle changes seemed to correspond with an improvement in memory.  Aricept was started 3 years later and a repeat memory test on 08-14-10 documented the patient reaching 28/30 points and 15 animals in his work fluency testing.   Than in our last visit he was tested by [facility], unfortunately  not MMSE, scoring 20-30 points. The patient had no recent hospitalizations, but had a fall in his living room. He fractured several ribs and is followed by [name]. He is coumadinized .  Patient still has some thoracic pain since the rib fractures however he seems to be able to take a deep breath other well. He was also able to rise from a seated position without assistence , yet braced himself.   Review of Systems: Out of a complete 14 system review, the patient complains of only the following symptoms, and all other reviewed systems are negative. The patient endorsed  on the geriatric depression score 1 single point. Of fatigue severity score or Epworth sleepiness score was not obtained today.  History   Social History  . Marital Status: Married    Spouse Name:  [name] [name]    Number of Children: 6  . Years of Education: college   Occupational History  . Not on file.   Social History Main Topics  . Smoking status: Former Smoker    Quit date: 07/02/1974  . Smokeless tobacco: Never Used  . Alcohol Use: Yes     Comment: 2-3 "stiff" drinks per day  . Drug Use: No  . Sexual Activity: Not on file   Other Topics Concern  . Not on file   Social History Narrative   Patient is married [name] [name]) and lives at home with his wife.   Patient has a college education.   Patient is a retired Materials engineer.   Patient does not drink any caffeine.   Patient has six children.    Family History  Problem Relation Age of Onset  . Stroke Father   . Cervical cancer      siblings  . COPD Sister   . Dementia Brother     Past Medical History  Diagnosis Date  . HTN (hypertension)   . CAD (coronary artery disease)   . High cholesterol   . Mild dementia   . Memory loss     Past Surgical History  Procedure Laterality Date  . Rotator cuff surgery  1985  . Appendectomy    . Tonsillectomy    . Triple heart bypass    .  Knee surgery    . Angiolplasty    . Pilonidal cyst excision      Current Outpatient Prescriptions  Medication Sig Dispense Refill  . atorvastatin (LIPITOR) 40 MG tablet Take 40 mg by mouth daily.      . Cholecalciferol (VITAMIN D) 2000 UNITS CAPS Take 1 capsule by mouth daily.      . digoxin (LANOXIN) 0.125 MG tablet Take 0.125 mg by mouth daily.      [name] donepezil (ARICEPT) 10 MG tablet Take 10 mg by mouth. Take 10 mg every morning and 5 mg each evening.      . finasteride (PROSCAR) 5 MG tablet Take 5 mg by mouth daily.      [name] lisinopril (PRINIVIL,ZESTRIL) 5 MG tablet Take 5 mg by mouth daily.      . metoprolol tartrate (LOPRESSOR) 25 MG tablet Take 0.5 tablets (12.5 mg total) by mouth 2 (two) times daily.  15 tablet  6  . Multiple Vitamin (MULTIVITAMIN) tablet Take 1 tablet by mouth daily.      [name] omeprazole (PRILOSEC)  20 MG capsule Take 20 mg by mouth daily.      [name] warfarin (COUMADIN) 4 MG tablet Take 4 mg by mouth daily.      [name] zolpidem (AMBIEN) 5 MG tablet Take 5 mg by mouth at bedtime as needed for sleep.       No current facility-administered medications for this visit.    Allergies as of 07/28/2013 - Review Complete 07/28/2013  Allergen Reaction Noted  . Amiodarone Other (See Comments) 01/26/2013  . Antihistamines, diphenhydramine-type Other (See Comments) 01/26/2013    Vitals: BP 146/80  Pulse 72  Resp 18  Ht 5' 7.5" (1.715 m)  Wt 173 lb (78.472 kg)  BMI 26.68 kg/m2 Last Weight:  Wt Readings from Last 1 Encounters:  07/28/13 173 lb (78.472 kg)   Last Height:   Ht Readings from Last 1 Encounters:  07/28/13 5' 7.5" (1.715 m)    Physical exam:  General: The patient is awake, alert and appears not in acute distress. The patient is well groomed. Head: Normocephalic, atraumatic. Neck is supple. Mallampati 2 , neck circumference: 15 , stooped, hunched.  Cardiovascular:  Regular rate and rhythm , without  murmurs or carotid bruit, and without distended neck veins. Respiratory: Lungs are clear to auscultation. Skin:  Without evidence of edema, or rash Trunk:   Neurologic exam : The patient is awake and alert, oriented to place and time.  Memory subjective   described as stable , but impaired to short term memory. MOCA 20 .  There is a normal attention span & concentration ability. Speech is fluent with  dysphonia or but not aphasia. Mood and affect are appropriate.  Cranial nerves: Pupils are equal and briskly reactive to light. Funduscopic exam without  evidence of pallor or edema. Extraocular movements  in vertical and horizontal planes intact and without nystagmus.  Visual fields by finger perimetry are intact. Hearing to finger rub intact.  Facial sensation intact to fine touch. Facial motor strength is symmetric and tongue and uvula move midline.  Motor exam:  The patient has a  hiatal muscle on and vigor are at the biceps, evident on elbow flexion and passive range of motion testing as well as is on bullet rests. He does provide a fairly good grip strength. On coordination testing he is past pointing on finger-to-nose test with his left hand more than with his right. There is a mild tremor posturally and  altered by action.   Sensory intact to all primary modalities.  Patient rose form a seated position with bracing , no external help needed.  Gait is stable. No steppage, no para-spasticity , normal stride.   Assessment : progressive memory loss, dominant short term memory. Still an avid reader, not a big exerciser.  He no longer walks as much , Sleep is unchanged. No REM BD,  and he continues 5 mg Aricept bid po.    Plan :  Rv  in 6-8 month, with NP Lam. MMSE , not MOCA please.  I would like to get an MRI

## 2013-08-06 DIAGNOSIS — Z7901 Long term (current) use of anticoagulants: Secondary | ICD-10-CM | POA: Diagnosis not present

## 2013-08-06 DIAGNOSIS — Z8679 Personal history of other diseases of the circulatory system: Secondary | ICD-10-CM | POA: Diagnosis not present

## 2013-08-14 DIAGNOSIS — I495 Sick sinus syndrome: Secondary | ICD-10-CM | POA: Diagnosis not present

## 2013-08-26 DIAGNOSIS — Z7901 Long term (current) use of anticoagulants: Secondary | ICD-10-CM | POA: Diagnosis not present

## 2013-08-26 DIAGNOSIS — I4891 Unspecified atrial fibrillation: Secondary | ICD-10-CM | POA: Diagnosis not present

## 2013-09-09 DIAGNOSIS — I495 Sick sinus syndrome: Secondary | ICD-10-CM | POA: Diagnosis not present

## 2013-10-06 DIAGNOSIS — I4891 Unspecified atrial fibrillation: Secondary | ICD-10-CM | POA: Diagnosis not present

## 2013-10-08 DIAGNOSIS — I495 Sick sinus syndrome: Secondary | ICD-10-CM | POA: Diagnosis not present

## 2013-10-13 DIAGNOSIS — I4891 Unspecified atrial fibrillation: Secondary | ICD-10-CM | POA: Diagnosis not present

## 2013-10-19 DIAGNOSIS — L570 Actinic keratosis: Secondary | ICD-10-CM | POA: Diagnosis not present

## 2013-10-19 DIAGNOSIS — L819 Disorder of pigmentation, unspecified: Secondary | ICD-10-CM | POA: Diagnosis not present

## 2013-10-19 DIAGNOSIS — L821 Other seborrheic keratosis: Secondary | ICD-10-CM | POA: Diagnosis not present

## 2013-10-19 DIAGNOSIS — C44711 Basal cell carcinoma of skin of unspecified lower limb, including hip: Secondary | ICD-10-CM | POA: Diagnosis not present

## 2013-10-28 ENCOUNTER — Ambulatory Visit (INDEPENDENT_AMBULATORY_CARE_PROVIDER_SITE_OTHER): Payer: Medicare Other | Admitting: *Deleted

## 2013-10-28 DIAGNOSIS — I441 Atrioventricular block, second degree: Secondary | ICD-10-CM

## 2013-10-28 DIAGNOSIS — I4891 Unspecified atrial fibrillation: Secondary | ICD-10-CM

## 2013-10-28 LAB — PACEMAKER DEVICE OBSERVATION

## 2013-10-29 LAB — MDC_IDC_ENUM_SESS_TYPE_INCLINIC
Lead Channel Impedance Value: 534 Ohm
Lead Channel Pacing Threshold Amplitude: 0.375 V
Lead Channel Pacing Threshold Pulse Width: 0.5 ms
Lead Channel Setting Pacing Pulse Width: 0.5 ms
Lead Channel Setting Sensing Sensitivity: 2 mV
MDC IDC MSMT BATTERY IMPEDANCE: 1100 Ohm
MDC IDC MSMT BATTERY VOLTAGE: 2.79 V
MDC IDC PG SERIAL: 1216574
MDC IDC SESS DTM: 20150429124813

## 2013-10-29 NOTE — Progress Notes (Signed)
Pacemaker check in clinic. Normal device function. Threshold and impedance consistent with previous measurements. Device programmed to maximize longevity. Permanent AF + Warfarin. No high ventricular rates noted. Device programmed at appropriate safety margins. Histogram distribution appropriate for patient activity level. Device programmed to optimize intrinsic conduction. Estimated longevity 7-9 years. Patient will follow up with [facility] in 6 months.

## 2013-11-03 DIAGNOSIS — I4891 Unspecified atrial fibrillation: Secondary | ICD-10-CM | POA: Diagnosis not present

## 2013-11-04 DIAGNOSIS — I495 Sick sinus syndrome: Secondary | ICD-10-CM | POA: Diagnosis not present

## 2013-11-05 DIAGNOSIS — Z85828 Personal history of other malignant neoplasm of skin: Secondary | ICD-10-CM | POA: Diagnosis not present

## 2013-11-17 DIAGNOSIS — I4891 Unspecified atrial fibrillation: Secondary | ICD-10-CM | POA: Diagnosis not present

## 2013-12-08 DIAGNOSIS — I4891 Unspecified atrial fibrillation: Secondary | ICD-10-CM | POA: Diagnosis not present

## 2013-12-08 DIAGNOSIS — Z79899 Other long term (current) drug therapy: Secondary | ICD-10-CM | POA: Diagnosis not present

## 2013-12-08 DIAGNOSIS — I10 Essential (primary) hypertension: Secondary | ICD-10-CM | POA: Diagnosis not present

## 2013-12-14 DIAGNOSIS — I4891 Unspecified atrial fibrillation: Secondary | ICD-10-CM | POA: Diagnosis not present

## 2014-01-07 DIAGNOSIS — I4891 Unspecified atrial fibrillation: Secondary | ICD-10-CM | POA: Diagnosis not present

## 2014-01-13 ENCOUNTER — Encounter: Payer: Self-pay | Admitting: Cardiovascular Disease

## 2014-01-19 ENCOUNTER — Encounter: Payer: Self-pay | Admitting: Cardiovascular Disease

## 2014-01-20 ENCOUNTER — Encounter: Payer: Self-pay | Admitting: Cardiovascular Disease

## 2014-01-20 ENCOUNTER — Telehealth: Payer: Self-pay | Admitting: *Deleted

## 2014-01-20 NOTE — Telephone Encounter (Signed)
Spoke with patient to r/s appointment due to CM unavailable for admin, also patient was last seen by CM in 2013, patient was r/s with NP MM on 01/22/14 at 10 am.

## 2014-01-22 ENCOUNTER — Encounter: Payer: Self-pay | Admitting: Adult Health

## 2014-01-22 ENCOUNTER — Ambulatory Visit (INDEPENDENT_AMBULATORY_CARE_PROVIDER_SITE_OTHER): Payer: Medicare Other | Admitting: Adult Health

## 2014-01-22 VITALS — BP 97/58 | HR 72 | Ht 67.75 in | Wt 172.0 lb

## 2014-01-22 DIAGNOSIS — F03918 Unspecified dementia, unspecified severity, with other behavioral disturbance: Secondary | ICD-10-CM | POA: Diagnosis not present

## 2014-01-22 DIAGNOSIS — F0391 Unspecified dementia with behavioral disturbance: Secondary | ICD-10-CM | POA: Diagnosis not present

## 2014-01-22 NOTE — Progress Notes (Signed)
PATIENT: [name] [name] DOB: 07-04-1925  REASON FOR VISIT: follow up HISTORY FROM: patient  HISTORY OF PRESENT ILLNESS: [name] [name] is an 80 year male with a history of dementia. He returns today for follow up. The patient is currently taking Aricept 10 mg and is tolerating it well. Wife states that his behavior has been good but his memory has continued to decline. Patient denies having to give up anything due to his memory. Patient does operate a Teacher, music. He states that he has gotten lost but is always able to find his way back. He states that he is able to complete all ADLs independently. Wife states that he forgets things that she just told him and he repeats himself a lot. No new medical issues since last seen.   REVIEW OF SYSTEMS: Full 14 system review of systems performed and notable only for:  Constitutional: appetitive change, chills Eyes: N/A Ear/Nose/Throat: N/A  Skin: rash, moles, itching Cardiovascular: N/A  Respiratory: N/A  Gastrointestinal: N/A  Genitourinary: N/A Hematology/Lymphatic: bruise easily Endocrine: N/A Musculoskeletal:N/A  Allergy/Immunology: N/A  Neurological: memory loss Psychiatric: N/A Sleep: N/A   ALLERGIES: Allergies  Allergen Reactions  . Amiodarone Other (See Comments)    Had a severe lung infection in 2003  . Antihistamines, Diphenhydramine-Type Other (See Comments)    Jittery    HOME MEDICATIONS: Outpatient Prescriptions Prior to Visit  Medication Sig Dispense Refill  . atorvastatin (LIPITOR) 40 MG tablet Take 40 mg by mouth daily.      . Cholecalciferol (VITAMIN D) 2000 UNITS CAPS Take 1 capsule by mouth daily.      . digoxin (LANOXIN) 0.125 MG tablet Take 0.125 mg by mouth daily.      [name] donepezil (ARICEPT) 5 MG tablet Take 2 tablets (10 mg total) by mouth as directed. Take 10 mg every day, devided into 5 mg in  morning and 5 mg each evening.  180 tablet  3  . finasteride (PROSCAR) 5 MG tablet Take 5 mg by mouth daily.      [name]  lisinopril (PRINIVIL,ZESTRIL) 5 MG tablet Take 5 mg by mouth daily.      . metoprolol tartrate (LOPRESSOR) 25 MG tablet Take 0.5 tablets (12.5 mg total) by mouth 2 (two) times daily.  15 tablet  6  . Multiple Vitamin (MULTIVITAMIN) tablet Take 1 tablet by mouth daily.      [name] omeprazole (PRILOSEC) 20 MG capsule Take 20 mg by mouth daily.      [name] warfarin (COUMADIN) 4 MG tablet Take 4 mg by mouth daily.       No facility-administered medications prior to visit.    PAST MEDICAL HISTORY: Past Medical History  Diagnosis Date  . HTN (hypertension)   . CAD (coronary artery disease)   . High cholesterol   . Mild dementia   . Memory loss     PAST SURGICAL HISTORY: Past Surgical History  Procedure Laterality Date  . Rotator cuff surgery  1985  . Appendectomy    . Tonsillectomy    . Triple heart bypass    . Knee surgery    . Angiolplasty    . Pilonidal cyst excision      FAMILY HISTORY: Family History  Problem Relation Age of Onset  . Stroke Father   . Cervical cancer      siblings  . COPD Sister   . Dementia Brother     SOCIAL HISTORY: History   Social History  . Marital Status: Married  Spouse Name: [name]    Number of Children: 6  . Years of Education: college   Occupational History  . Not on file.   Social History Main Topics  . Smoking status: Former Smoker    Quit date: 07/02/1974  . Smokeless tobacco: Never Used  . Alcohol Use: Yes     Comment: 2-3 "stiff" drinks per day  . Drug Use: No  . Sexual Activity: Not on file   Other Topics Concern  . Not on file   Social History Narrative   Patient is married [name]) and lives at home with his wife.   Patient has a college education.   Patient is a retired Materials engineer.   Patient does not drink any caffeine.   Patient has six children.      PHYSICAL EXAM  Filed Vitals:   01/22/14 0949  BP: 97/58  Pulse: 72  Height: 5' 7.75" (1.721 m)  Weight: 172 lb (78.019 kg)   Body mass  index is 26.34 kg/(m^2).  Generalized: Well developed, in no acute distress   Neurological examination  Mentation: Alert oriented to time, place, history taking. Follows all commands speech and language fluent. MOCA 20/30 Cranial nerve II-XII:  Extraocular movements were full, visual field were full on confrontational test.  Motor: The motor testing reveals 5 over 5 strength of all 4 extremities. Good symmetric motor tone is noted throughout.  Sensory: Sensory testing is intact to soft touch on all 4 extremities. No evidence of extinction is noted.  Coordination: Cerebellar testing reveals good finger-nose-finger and heel-to-shin bilaterally.  Gait and station: Gait is normal. Tandem gait is normal. Romberg is negative. No drift is seen.  Reflexes: Deep tendon reflexes are symmetric but depressed    DIAGNOSTIC DATA (LABS, IMAGING, TESTING) - I reviewed patient records, labs, notes, testing and imaging myself where available.  Lab Results  Component Value Date   WBC 5.0 11/28/2009   HGB 9.3* 11/28/2009   HCT 27.6* 11/28/2009   MCV 95.8 11/28/2009   PLT 212 11/28/2009      Component Value Date/Time   NA 138 11/24/2009 0416   K 3.8 11/24/2009 0416   CL 107 11/24/2009 0416   CO2 27 11/24/2009 0416   GLUCOSE 92 11/24/2009 0416   BUN 6 11/24/2009 0416   CREATININE 1.03 11/24/2009 0416   CALCIUM 8.3* 11/24/2009 0416   PROT 5.3* 11/23/2009 1100   ALBUMIN 2.8* 11/23/2009 1100   AST 32 11/23/2009 1100   ALT 17 11/23/2009 1100   ALKPHOS 68 11/23/2009 1100   BILITOT 0.6 11/23/2009 1100   GFRNONAA >60 11/24/2009 0416   GFRAA  Value: >60        The eGFR has been calculated using the MDRD equation. This calculation has not been validated in all clinical situations. eGFR's persistently <60 mL/min signify possible Chronic Kidney Disease. 11/24/2009 0416   No results found for this basename: CHOL, HDL, LDLCALC, LDLDIRECT, TRIG, CHOLHDL   Lab Results  Component Value Date   HGBA1C  Value: 5.5 (NOTE) The ADA  recommends the following therapeutic goal for glycemic control related to Hgb A1c measurement: Goal of therapy: <6.5 Hgb A1c  Reference: American Diabetes Association: Clinical Practice Recommendations 2010, Diabetes Care, 2010, 33: (Suppl  1). 12/13/2008       ASSESSMENT AND PLAN 80 y.o. year old male  has a past medical history of HTN (hypertension); CAD (coronary artery disease); High cholesterol; Mild dementia; and Memory loss. here with:  1. Memory loss  Patient has remained stable on Aricept. MOCA improved this visit. He scored 20/30 previous score was 18/30. Patient should continue the Aricept. Patient's BP was low this visit. I advised him to start checking his BP at home and if this is consistent then he should let his PCP know so they can make adjustments on his medications. Patient should follow-up in 6 months or sooner if needed.    Ward Givens, MSN, NP-C 01/22/2014, 10:13 AM [facility] [address] [place], [place] [address] [phone]  Note: This document was prepared with digital dictation and possible smart phrase technology. Any transcriptional errors that result from this process are unintentional.

## 2014-01-22 NOTE — Patient Instructions (Signed)

## 2014-01-22 NOTE — Progress Notes (Signed)
I agree with the assessment and plan as directed by NP .The patient is known to me .   Vendela Troung, MD  

## 2014-01-26 ENCOUNTER — Encounter: Payer: Self-pay | Admitting: Cardiovascular Disease

## 2014-01-27 ENCOUNTER — Ambulatory Visit: Payer: Medicare Other | Admitting: Nurse Practitioner

## 2014-02-04 DIAGNOSIS — L570 Actinic keratosis: Secondary | ICD-10-CM | POA: Diagnosis not present

## 2014-02-04 DIAGNOSIS — I4891 Unspecified atrial fibrillation: Secondary | ICD-10-CM | POA: Diagnosis not present

## 2014-02-04 DIAGNOSIS — Z85828 Personal history of other malignant neoplasm of skin: Secondary | ICD-10-CM | POA: Diagnosis not present

## 2014-02-08 ENCOUNTER — Encounter: Payer: Self-pay | Admitting: Cardiovascular Disease

## 2014-02-24 DIAGNOSIS — I10 Essential (primary) hypertension: Secondary | ICD-10-CM | POA: Diagnosis not present

## 2014-02-24 DIAGNOSIS — Z23 Encounter for immunization: Secondary | ICD-10-CM | POA: Diagnosis not present

## 2014-02-24 DIAGNOSIS — R630 Anorexia: Secondary | ICD-10-CM | POA: Diagnosis not present

## 2014-03-09 DIAGNOSIS — I4891 Unspecified atrial fibrillation: Secondary | ICD-10-CM | POA: Diagnosis not present

## 2014-03-17 DIAGNOSIS — I495 Sick sinus syndrome: Secondary | ICD-10-CM | POA: Diagnosis not present

## 2014-04-03 DIAGNOSIS — Z23 Encounter for immunization: Secondary | ICD-10-CM | POA: Diagnosis not present

## 2014-04-06 DIAGNOSIS — I4891 Unspecified atrial fibrillation: Secondary | ICD-10-CM | POA: Diagnosis not present

## 2014-04-21 DIAGNOSIS — H02401 Unspecified ptosis of right eyelid: Secondary | ICD-10-CM | POA: Diagnosis not present

## 2014-04-21 DIAGNOSIS — H52203 Unspecified astigmatism, bilateral: Secondary | ICD-10-CM | POA: Diagnosis not present

## 2014-04-21 DIAGNOSIS — Z961 Presence of intraocular lens: Secondary | ICD-10-CM | POA: Diagnosis not present

## 2014-04-27 DIAGNOSIS — I4891 Unspecified atrial fibrillation: Secondary | ICD-10-CM | POA: Diagnosis not present

## 2014-05-04 ENCOUNTER — Ambulatory Visit (INDEPENDENT_AMBULATORY_CARE_PROVIDER_SITE_OTHER): Payer: Medicare Other | Admitting: Cardiovascular Disease

## 2014-05-04 ENCOUNTER — Encounter: Payer: Self-pay | Admitting: Cardiovascular Disease

## 2014-05-04 VITALS — BP 122/70 | HR 70 | Resp 16 | Ht 72.0 in | Wt 171.0 lb

## 2014-05-04 DIAGNOSIS — I4891 Unspecified atrial fibrillation: Secondary | ICD-10-CM | POA: Diagnosis not present

## 2014-05-04 DIAGNOSIS — Z950 Presence of cardiac pacemaker: Secondary | ICD-10-CM

## 2014-05-04 LAB — MDC_IDC_ENUM_SESS_TYPE_INCLINIC
Implantable Pulse Generator Model: 5826
Lead Channel Impedance Value: 541 Ohm
Lead Channel Pacing Threshold Pulse Width: 0.5 ms
Lead Channel Setting Pacing Pulse Width: 0.5 ms
MDC IDC MSMT BATTERY IMPEDANCE: 1300 Ohm
MDC IDC MSMT BATTERY VOLTAGE: 2.79 V
MDC IDC MSMT LEADCHNL RV PACING THRESHOLD AMPLITUDE: 0.375 V
MDC IDC PG SERIAL: 1216574
MDC IDC SESS DTM: 20151103144551
MDC IDC SET LEADCHNL RV SENSING SENSITIVITY: 2 mV

## 2014-05-04 NOTE — Patient Instructions (Addendum)
Your physician recommends that you schedule a follow-up appointment in: 6 months with the [facility] and in 12 months with Dr.Croitoru

## 2014-05-04 NOTE — Progress Notes (Signed)
Patient ID: [name] [name], male   DOB: [birth year], 80 y.o.   MRN: [id number]     Reason for office visit CAD, permanent atrial fibrillation, pacemaker  [name] [name] is now 80 years old. His moderate dementia does not appear any worse to me than it was about a year ago. As always he is accompanied by his wife. His dual-chamber permanent pacemaker implanted in 2009 is now programmed VVIR for permanent atrial fibrillation with slow ventricular response. He paces the ventricle 100% of the time and we did not identify any ventricular escape rhythm today when pacing was taken down to 30 bpm. Estimated battery longevity is between 6.5 and 8 years. The lead parameters are excellent. He does not obviously have any problems with rapid ventricular response.   He had bypass surgery about 5 years ago and has not had any signs or symptoms of coronary disease since that time. His nuclear stress test in August 2014 showed a dense inferolateral scar. Left ventricular ejection fraction was around 50%. He has no complaints of angina or dyspnea, palpitations or syncope, edema or new focal neurological events. He has never had TIA or stroke and has not had any bleeding complications on warfarin anticoagulation. He has not had any falls.  2 years ago I advised stopping his digoxin since I thought this medication was not of benefit in the setting of permanent atrial fibrillation. They called back a few weeks later and stated that he felt better while taking the digoxin and the medication was restarted. I am still concerned about the potential toxic effect of this medication in this elderly patient.  Allergies  Allergen Reactions  . Amiodarone Other (See Comments)    Had a severe lung infection in 2003  . Antihistamines, Diphenhydramine-Type Other (See Comments)    Jittery    Current Outpatient Prescriptions  Medication Sig Dispense Refill  . atorvastatin (LIPITOR) 40 MG tablet Take 40 mg by mouth daily.    .  Cholecalciferol (VITAMIN D) 2000 UNITS CAPS Take 1 capsule by mouth daily.    . digoxin (LANOXIN) 0.125 MG tablet Take 0.125 mg by mouth daily.    [name] donepezil (ARICEPT) 5 MG tablet Take 2 tablets (10 mg total) by mouth as directed. Take 10 mg every day, devided into 5 mg in  morning and 5 mg each evening. 180 tablet 3  . finasteride (PROSCAR) 5 MG tablet Take 5 mg by mouth daily.    . metoprolol tartrate (LOPRESSOR) 25 MG tablet Take 0.5 tablets (12.5 mg total) by mouth 2 (two) times daily. 15 tablet 6  . Multiple Vitamin (MULTIVITAMIN) tablet Take 1 tablet by mouth daily.    [name] omeprazole (PRILOSEC) 20 MG capsule Take 20 mg by mouth daily.    [name] warfarin (COUMADIN) 4 MG tablet Take 4 mg by mouth daily.    [name] zolpidem (AMBIEN) 5 MG tablet Take 5 mg by mouth at bedtime as needed for sleep.     No current facility-administered medications for this visit.    Past Medical History  Diagnosis Date  . HTN (hypertension)   . CAD (coronary artery disease)   . High cholesterol   . Mild dementia   . Memory loss     Past Surgical History  Procedure Laterality Date  . Rotator cuff surgery  1985  . Appendectomy    . Tonsillectomy    . Triple heart bypass    . Knee surgery    . Angiolplasty    . Pilonidal  cyst excision      Family History  Problem Relation Age of Onset  . Stroke Father   . Cervical cancer      siblings  . COPD Sister   . Dementia Brother     History   Social History  . Marital Status: Married    Spouse Name: [name] [name]    Number of Children: 6  . Years of Education: college   Occupational History  . Not on file.   Social History Main Topics  . Smoking status: Former Smoker    Quit date: 07/02/1974  . Smokeless tobacco: Never Used  . Alcohol Use: Yes     Comment: 2-3 "stiff" drinks per day  . Drug Use: No  . Sexual Activity: Not on file   Other Topics Concern  . Not on file   Social History Narrative   Patient is married [name] [name]) and lives at home  with his wife.   Patient has a college education.   Patient is a retired Materials engineer.   Patient does not drink any caffeine.   Patient has six children.    Review of systems: The patient specifically denies any chest pain at rest or with exertion, dyspnea at rest or with exertion, orthopnea, paroxysmal nocturnal dyspnea, syncope, palpitations, focal neurological deficits, intermittent claudication, lower extremity edema, unexplained weight gain, cough, hemoptysis or wheezing.  The patient also denies abdominal pain, nausea, vomiting, dysphagia, diarrhea, constipation, polyuria, polydipsia, dysuria, hematuria, frequency, urgency, abnormal bleeding or bruising, fever, chills, unexpected weight changes, mood swings, change in skin or hair texture, change in voice quality, auditory or visual problems, allergic reactions or rashes, new musculoskeletal complaints other than usual "aches and pains".  PHYSICAL EXAM BP 122/70 mmHg  Pulse 70  Resp 16  Ht 6' (1.829 m)  Wt 171 lb (77.565 kg)  BMI 23.19 kg/m2 The patient specifically denies any chest pain at rest or with exertion, dyspnea at rest or with exertion, orthopnea, paroxysmal nocturnal dyspnea, syncope, palpitations, focal neurological deficits, intermittent claudication, lower extremity edema, unexplained weight gain, cough, hemoptysis or wheezing.  The patient also denies abdominal pain, nausea, vomiting, dysphagia, diarrhea, constipation, polyuria, polydipsia, dysuria, hematuria, frequency, urgency, abnormal bleeding or bruising, fever, chills, unexpected weight changes, mood swings, change in skin or hair texture, change in voice quality, auditory or visual problems, allergic reactions or rashes, new musculoskeletal complaints other than usual "aches and pains".  EKG: Atrial fibrillation, ventricular paced  BMET    Component Value Date/Time   NA 138 11/24/2009 0416   K 3.8 11/24/2009 0416   CL 107 11/24/2009 0416   CO2  27 11/24/2009 0416   GLUCOSE 92 11/24/2009 0416   BUN 6 11/24/2009 0416   CREATININE 1.03 11/24/2009 0416   CALCIUM 8.3* 11/24/2009 0416   GFRNONAA >60 11/24/2009 0416   GFRAA  11/24/2009 0416    >60        The eGFR has been calculated using the MDRD equation. This calculation has not been validated in all clinical situations. eGFR's persistently <60 mL/min signify possible Chronic Kidney Disease.     ASSESSMENT AND PLAN  Atrial fibrillation with slow ventricular response I had stopped his digoxin at the previous appointment, but he resumed it since he appears to feel better with it.Will try again to stop it due to its potential toxicity. He should remain on lifelong warfarin anticoagulation as long as he does not have bleeding complication or frequent falls.  CAD (coronary artery disease) Acute myocardial  infarction 1997, CABG June 2010 (Dr. [name], LIMA to LAD, SVG to distal circumflex, SVG to distal RCA) with intraoperative left-sided maze and left atrial appendage ligation. Note unchanged inferolateral wall scar by nuclear stress testing 2014, anterior and apical hypokinesis with EF of 50-55% by echo May 2010, 51% by nuclear scintigraphy. Asymptomatic.  Pacemaker [place] [phone], dual chamber device implanted July 2009 (generator change, initial pacemaker and leads implanted in 2001), programmed VVIR for permanent atrial fibrillation. Normal device function by comprehensive in office check today, repeat check in 6 months.   Orders Placed This Encounter  Procedures  . Implantable device check  . EKG 12-Lead   No orders of the defined types were placed in this encounter.    Holli Humbles, MD, [place] [phone] office [phone] pager

## 2014-05-12 ENCOUNTER — Encounter: Payer: Self-pay | Admitting: Cardiovascular Disease

## 2014-05-18 DIAGNOSIS — I4891 Unspecified atrial fibrillation: Secondary | ICD-10-CM | POA: Diagnosis not present

## 2014-05-18 DIAGNOSIS — Z7901 Long term (current) use of anticoagulants: Secondary | ICD-10-CM | POA: Diagnosis not present

## 2014-05-19 ENCOUNTER — Encounter: Payer: Self-pay | Admitting: Neurology

## 2014-05-25 ENCOUNTER — Encounter: Payer: Self-pay | Admitting: Neurology

## 2014-06-04 DIAGNOSIS — G301 Alzheimer's disease with late onset: Secondary | ICD-10-CM | POA: Diagnosis not present

## 2014-06-04 DIAGNOSIS — I10 Essential (primary) hypertension: Secondary | ICD-10-CM | POA: Diagnosis not present

## 2014-06-04 DIAGNOSIS — I481 Persistent atrial fibrillation: Secondary | ICD-10-CM | POA: Diagnosis not present

## 2014-06-04 DIAGNOSIS — Z1389 Encounter for screening for other disorder: Secondary | ICD-10-CM | POA: Diagnosis not present

## 2014-06-04 DIAGNOSIS — L989 Disorder of the skin and subcutaneous tissue, unspecified: Secondary | ICD-10-CM | POA: Diagnosis not present

## 2014-06-04 DIAGNOSIS — Z0000 Encounter for general adult medical examination without abnormal findings: Secondary | ICD-10-CM | POA: Diagnosis not present

## 2014-06-15 DIAGNOSIS — I4891 Unspecified atrial fibrillation: Secondary | ICD-10-CM | POA: Diagnosis not present

## 2014-06-16 DIAGNOSIS — I495 Sick sinus syndrome: Secondary | ICD-10-CM | POA: Diagnosis not present

## 2014-07-13 DIAGNOSIS — I4891 Unspecified atrial fibrillation: Secondary | ICD-10-CM | POA: Diagnosis not present

## 2014-07-28 ENCOUNTER — Ambulatory Visit (INDEPENDENT_AMBULATORY_CARE_PROVIDER_SITE_OTHER): Payer: Medicare Other | Admitting: Adult Health

## 2014-07-28 ENCOUNTER — Encounter: Payer: Self-pay | Admitting: Adult Health

## 2014-07-28 VITALS — BP 106/72 | HR 69 | Ht 68.0 in | Wt 170.0 lb

## 2014-07-28 DIAGNOSIS — F0391 Unspecified dementia with behavioral disturbance: Secondary | ICD-10-CM | POA: Diagnosis not present

## 2014-07-28 DIAGNOSIS — F03918 Unspecified dementia, unspecified severity, with other behavioral disturbance: Secondary | ICD-10-CM

## 2014-07-28 NOTE — Progress Notes (Signed)
PATIENT: [name] [name] DOB: [birth year]  REASON FOR VISIT: follow up- Dementia  HISTORY FROM: patient  HISTORY OF PRESENT ILLNESS: [name] [name] is a 81 year old male with a history of dementia. He returns today for follow-up. He is currently taking Aricept 10 mg and tolerating it well. The patient states that his memory is  "good." his wife reports that she feels that it has remained the same. The patient is able to complete all ADLs independently. The patient has a driver's licenses but no longer drives. His wife does all the driving.  He denies having to give up anything due to his memory. The wife states that the patient's behavior has not been an issue. Denies any agitation or aggression.  HISTORY 01/22/14 (WILLIS): [name] [name] is an 81 year male with a history of dementia. He returns today for follow up. The patient is currently taking Aricept 10 mg and is tolerating it well. Wife states that his behavior has been good but his memory has continued to decline. Patient denies having to give up anything due to his memory. Patient does operate a Teacher, music. He states that he has gotten lost but is always able to find his way back. He states that he is able to complete all ADLs independently. Wife states that he forgets things that she just told him and he repeats himself a lot. No new medical issues since last seen.  REVIEW OF SYSTEMS: Out of a complete 14 system review of symptoms, the patient complains only of the following symptoms, and all other reviewed systems are negative.  52, daytime sleepiness,/easily, memory loss, moles  ALLERGIES: Allergies  Allergen Reactions  . Amiodarone Other (See Comments)    Had a severe lung infection in 2003  . Antihistamines, Diphenhydramine-Type Other (See Comments)    Jittery    HOME MEDICATIONS: Outpatient Prescriptions Prior to Visit  Medication Sig Dispense Refill  . atorvastatin (LIPITOR) 40 MG tablet Take 40 mg by mouth daily.    .  Cholecalciferol (VITAMIN D) 2000 UNITS CAPS Take 1 capsule by mouth daily.    . digoxin (LANOXIN) 0.125 MG tablet Take 0.125 mg by mouth daily.    [name] donepezil (ARICEPT) 5 MG tablet Take 2 tablets (10 mg total) by mouth as directed. Take 10 mg every day, devided into 5 mg in  morning and 5 mg each evening. 180 tablet 3  . finasteride (PROSCAR) 5 MG tablet Take 5 mg by mouth daily.    . metoprolol tartrate (LOPRESSOR) 25 MG tablet Take 0.5 tablets (12.5 mg total) by mouth 2 (two) times daily. 15 tablet 6  . Multiple Vitamin (MULTIVITAMIN) tablet Take 1 tablet by mouth daily.    [name] omeprazole (PRILOSEC) 20 MG capsule Take 20 mg by mouth daily.    [name] warfarin (COUMADIN) 4 MG tablet Take 4 mg by mouth daily.    [name] zolpidem (AMBIEN) 5 MG tablet Take 5 mg by mouth at bedtime as needed for sleep.     No facility-administered medications prior to visit.    PAST MEDICAL HISTORY: Past Medical History  Diagnosis Date  . HTN (hypertension)   . CAD (coronary artery disease)   . High cholesterol   . Mild dementia   . Memory loss     PAST SURGICAL HISTORY: Past Surgical History  Procedure Laterality Date  . Rotator cuff surgery  1985  . Appendectomy    . Tonsillectomy    . Triple heart bypass    . Knee  surgery    . Angiolplasty    . Pilonidal cyst excision      FAMILY HISTORY: Family History  Problem Relation Age of Onset  . Stroke Father   . Cervical cancer      siblings  . COPD Sister   . Dementia Brother         PHYSICAL EXAM  Filed Vitals:   07/28/14 1105  BP: 106/72  Pulse: 69  Height: 5\' 8"  (1.727 m)  Weight: 170 lb (77.111 kg)   Body mass index is 25.85 kg/(m^2).  Generalized: Well developed, in no acute distress   Neurological examination  Mentation: Alert oriented to time, place, history taking. Follows all commands speech and language fluent. MOCA 20/30 MMSE 26/30 Cranial nerve II-XII: Pupils were equal round reactive to light. Extraocular movements were full,  visual field were full on confrontational test. Facial sensation and strength were normal. Uvula tongue midline. Head turning and shoulder shrug  were normal and symmetric. Motor: The motor testing reveals 5 over 5 strength of all 4 extremities. Good symmetric motor tone is noted throughout.  Sensory: Sensory testing is intact to soft touch on all 4 extremities. No evidence of extinction is noted.  Coordination: Cerebellar testing reveals good finger-nose-finger and heel-to-shin bilaterally.  Gait and station: Gait is normal. Tandem gait is unsteady. Romberg is negative. No drift is seen.  Reflexes: Deep tendon reflexes are symmetric and normal bilaterally.    DIAGNOSTIC DATA (LABS, IMAGING, TESTING) - I reviewed patient records, labs, notes, testing and imaging myself where available.   ASSESSMENT AND PLAN 81 y.o. year old male  has a past medical history of HTN (hypertension); CAD (coronary artery disease); High cholesterol; Mild dementia; and Memory loss. here with:  1. Dementia  Patient's memory score has remained stable. His MOCA is 20/30 was previously 20/30. MMSE 26/30. Patient will continue taking Aricept 10 mg at bedtime. If his symptoms worsen or he develops new symptoms patient let us know. Otherwise he will follow-up in 6 months or sooner if needed.  Ward Givens, MSN, NP-C 07/28/2014, 11:15 AM [facility] [address] [place], [place] [address] [phone]  Note: This document was prepared with digital dictation and possible smart phrase technology. Any transcriptional errors that result from this process are unintentional.

## 2014-07-28 NOTE — Progress Notes (Signed)
I agree with the assessment and plan as directed by NP .The patient is known to me .   Kenslei Hearty, MD  

## 2014-07-28 NOTE — Patient Instructions (Signed)
Continue Aricept 10 mg daily.  Your memory score has remained stable.  If your symptoms worsen or you develop new symptoms please let us know.

## 2014-07-29 DIAGNOSIS — D225 Melanocytic nevi of trunk: Secondary | ICD-10-CM | POA: Diagnosis not present

## 2014-07-29 DIAGNOSIS — L821 Other seborrheic keratosis: Secondary | ICD-10-CM | POA: Diagnosis not present

## 2014-07-29 DIAGNOSIS — L570 Actinic keratosis: Secondary | ICD-10-CM | POA: Diagnosis not present

## 2014-08-03 DIAGNOSIS — I4891 Unspecified atrial fibrillation: Secondary | ICD-10-CM | POA: Diagnosis not present

## 2014-08-24 DIAGNOSIS — I4891 Unspecified atrial fibrillation: Secondary | ICD-10-CM | POA: Diagnosis not present

## 2014-09-01 ENCOUNTER — Encounter: Payer: Self-pay | Admitting: Cardiovascular Disease

## 2014-09-16 DIAGNOSIS — I4891 Unspecified atrial fibrillation: Secondary | ICD-10-CM | POA: Diagnosis not present

## 2014-09-22 DIAGNOSIS — I4891 Unspecified atrial fibrillation: Secondary | ICD-10-CM | POA: Diagnosis not present

## 2014-10-05 ENCOUNTER — Encounter: Payer: Self-pay | Admitting: Cardiovascular Disease

## 2014-10-06 DIAGNOSIS — I4891 Unspecified atrial fibrillation: Secondary | ICD-10-CM | POA: Diagnosis not present

## 2014-11-03 DIAGNOSIS — I495 Sick sinus syndrome: Secondary | ICD-10-CM | POA: Diagnosis not present

## 2014-11-03 DIAGNOSIS — I4891 Unspecified atrial fibrillation: Secondary | ICD-10-CM | POA: Diagnosis not present

## 2014-11-04 ENCOUNTER — Other Ambulatory Visit: Payer: Self-pay

## 2014-11-10 ENCOUNTER — Ambulatory Visit (INDEPENDENT_AMBULATORY_CARE_PROVIDER_SITE_OTHER): Payer: Medicare Other | Admitting: *Deleted

## 2014-11-10 DIAGNOSIS — I4891 Unspecified atrial fibrillation: Secondary | ICD-10-CM

## 2014-11-10 DIAGNOSIS — I441 Atrioventricular block, second degree: Secondary | ICD-10-CM

## 2014-11-10 DIAGNOSIS — Z950 Presence of cardiac pacemaker: Secondary | ICD-10-CM

## 2014-11-10 LAB — CUP PACEART INCLINIC DEVICE CHECK
Battery Voltage: 2.79 V
Brady Statistic RV Percent Paced: 99 %
MDC IDC MSMT BATTERY IMPEDANCE: 1400 Ohm
MDC IDC MSMT LEADCHNL RV IMPEDANCE VALUE: 545 Ohm
MDC IDC MSMT LEADCHNL RV PACING THRESHOLD AMPLITUDE: 0.375 V
MDC IDC MSMT LEADCHNL RV PACING THRESHOLD PULSEWIDTH: 0.5 ms
MDC IDC PG SERIAL: 1216574
MDC IDC SESS DTM: 20160511105659
MDC IDC SET LEADCHNL RV PACING PULSEWIDTH: 0.5 ms
MDC IDC SET LEADCHNL RV SENSING SENSITIVITY: 2 mV
Pulse Gen Model: 5826

## 2014-11-10 NOTE — Progress Notes (Signed)
Pacemaker check in clinic. Normal device function. Threshold, impedance consistent with previous measurements. Device programmed to maximize longevity. Device programmed at appropriate safety margins. Histogram distribution appropriate for patient activity level. Device programmed to optimize intrinsic conduction. Estimated longevity 6.75-8.89yrs. ROV w/ MC in 28mo.

## 2014-12-01 DIAGNOSIS — I4891 Unspecified atrial fibrillation: Secondary | ICD-10-CM | POA: Diagnosis not present

## 2014-12-08 ENCOUNTER — Encounter: Payer: Self-pay | Admitting: Cardiovascular Disease

## 2014-12-16 DIAGNOSIS — I481 Persistent atrial fibrillation: Secondary | ICD-10-CM | POA: Diagnosis not present

## 2014-12-16 DIAGNOSIS — L821 Other seborrheic keratosis: Secondary | ICD-10-CM | POA: Diagnosis not present

## 2014-12-16 DIAGNOSIS — Z79899 Other long term (current) drug therapy: Secondary | ICD-10-CM | POA: Diagnosis not present

## 2014-12-16 DIAGNOSIS — G301 Alzheimer's disease with late onset: Secondary | ICD-10-CM | POA: Diagnosis not present

## 2014-12-16 DIAGNOSIS — E780 Pure hypercholesterolemia: Secondary | ICD-10-CM | POA: Diagnosis not present

## 2014-12-16 DIAGNOSIS — I10 Essential (primary) hypertension: Secondary | ICD-10-CM | POA: Diagnosis not present

## 2014-12-29 DIAGNOSIS — I4891 Unspecified atrial fibrillation: Secondary | ICD-10-CM | POA: Diagnosis not present

## 2014-12-29 DIAGNOSIS — Z79899 Other long term (current) drug therapy: Secondary | ICD-10-CM | POA: Diagnosis not present

## 2014-12-29 DIAGNOSIS — E780 Pure hypercholesterolemia: Secondary | ICD-10-CM | POA: Diagnosis not present

## 2015-01-11 ENCOUNTER — Encounter: Payer: Self-pay | Admitting: Cardiovascular Disease

## 2015-01-13 DIAGNOSIS — I4891 Unspecified atrial fibrillation: Secondary | ICD-10-CM | POA: Diagnosis not present

## 2015-01-26 ENCOUNTER — Ambulatory Visit (INDEPENDENT_AMBULATORY_CARE_PROVIDER_SITE_OTHER): Payer: Medicare Other | Admitting: Neurology

## 2015-01-26 ENCOUNTER — Encounter: Payer: Self-pay | Admitting: Neurology

## 2015-01-26 VITALS — BP 118/70 | HR 76 | Resp 20 | Ht 68.0 in | Wt 166.0 lb

## 2015-01-26 DIAGNOSIS — F0280 Dementia in other diseases classified elsewhere without behavioral disturbance: Secondary | ICD-10-CM | POA: Insufficient documentation

## 2015-01-26 DIAGNOSIS — G309 Alzheimer's disease, unspecified: Secondary | ICD-10-CM | POA: Diagnosis not present

## 2015-01-26 NOTE — Patient Instructions (Signed)
Memantine extended release capsules What is this medicine? MEMANTINE (MEM an teen) is used to treat dementia caused by Alzheimer's disease. This medicine may be used for other purposes; ask your health care provider or pharmacist if you have questions. COMMON BRAND NAME(S): Namenda XR What should I tell my health care provider before I take this medicine? They need to know if you have any of these conditions: -difficulty passing urine -kidney disease -liver disease -seizures -an unusual or allergic reaction to memantine, other medicines, foods, dyes, or preservatives -pregnant or trying to get pregnant -breast-feeding How should I use this medicine? Take this medicine by mouth with a glass of water. Follow the directions on the prescription label. You may take this medicine with or without food. You may swallow the capsules whole or open them and sprinkle the entire contents on applesauce before swallowing. Other than sprinkling the medicine on applesauce, the capsules should be swallowed whole and not divided, chewed, or crushed. Take your doses at regular intervals. Do not take your medicine more often than directed. Continue to take your medicine even if you feel better. Do not stop taking except on the advice of your doctor or health care professional. Talk to your pediatrician regarding the use of this medicine in children. Special care may be needed. Overdosage: If you think you've taken too much of this medicine contact a poison control center or emergency room at once. Overdosage: If you think you have taken too much of this medicine contact a poison control center or emergency room at once. NOTE: This medicine is only for you. Do not share this medicine with others. What if I miss a dose? If you miss a dose, take it as soon as you can. If it is almost time for your next dose, take only that dose. Do not take double or extra doses. If you do not take your medicine for several days,  contact your health care provider. Your dose may need to be changed. What may interact with this medicine? -acetazolamide -amantadine -cimetidine -dextromethorphan -dofetilide -hydrochlorothiazide -ketamine -metformin -methazolamide -quinidine -ranitidine -sodium bicarbonate -triamterene This list may not describe all possible interactions. Give your health care provider a list of all the medicines, herbs, non-prescription drugs, or dietary supplements you use. Also tell them if you smoke, drink alcohol, or use illegal drugs. Some items may interact with your medicine. What should I watch for while using this medicine? Visit your doctor or health care professional for regular checks on your progress. Check with your doctor or health care professional if there is no improvement in your symptoms or if they get worse. You may get drowsy or dizzy. Do not drive, use machinery, or do anything that needs mental alertness until you know how this drug affects you. Do not stand or sit up quickly, especially if you are an older patient. This reduces the risk of dizzy or fainting spells. Alcohol can make you more drowsy and dizzy. Avoid alcoholic drinks. What side effects may I notice from receiving this medicine? Side effects that you should report to your doctor or health care professional as soon as possible: -agitation or a feeling of restlessness -allergic reactions like skin rash, itching or hives, swelling of the face, lips, or tongue -depressed mood -dizziness -hallucinations -redness, blistering, peeling or loosening of the skin, including inside the mouth -seizures -vomiting Side effects that usually do not require medical attention (Report these to your doctor or health care professional if they continue or are bothersome.): -  constipation -diarrhea -headache -nausea -trouble sleeping This list may not describe all possible side effects. Call your doctor for medical advice about side  effects. You may report side effects to FDA at 1-800-FDA-1088. Where should I keep my medicine? Keep out of the reach of children. Store at room temperature between 15 degrees and 30 degrees C (59 degrees and 86 degrees F). Throw away any unused medicine after the expiration date. NOTE: This sheet is a summary. It may not cover all possible information. If you have questions about this medicine, talk to your doctor, pharmacist, or health care provider.  2015, Elsevier/Gold Standard. (2013-04-06 13:59:18)

## 2015-01-26 NOTE — Progress Notes (Signed)
PATIENT: [name] [name] DOB: June 30, 1926  REASON FOR VISIT: follow up- Dementia  HISTORY FROM: patient  HISTORY OF PRESENT ILLNESS: [name] [name] is a 81 year old male with a history of dementia. He returns today for follow-up. He is currently taking Aricept 10 mg and tolerating it well. The patient states that his memory is  "good." his wife reports that she feels that it has remained the same. The patient is able to complete all ADLs independently.  The patient has a driver's licenses but no longer drives. His wife does all the driving.   He denies having to give up anything due to his memory. The wife states that the patient's behavior has not been an issue.  On 7-20 7-16 the patient scored 25 out of 30 points and the Mini-Mental Status Examination for the last 12 months he has been very stable on Aricept alone. [name], his primary care physician, asked if Namenda could be added and indeed it could be. I am often combined the 2 medications Aricept and Namenda and they can be taken in the generic form or even in a combination product as one pill called Namziric.  I will start [name] [name] on a starter pack for once a day namenda.   He is interested in stopping protonix, and he may get by with Tumms at night. A trial of 4-6 weeks would suffice to show if he is still having reflux.   REVIEW OF SYSTEMS: Out of a complete 14 system review of symptoms, the patient complains only of the following symptoms, and all other reviewed systems are negative.  Mr [name] [name] is the 12th of 13 children, grew up in [place], [place], [place] . All catholic education.    daytime sleepiness,/easily, memory loss, moles, mild hearing loss. excellent vision.   ALLERGIES: Allergies  Allergen Reactions  . Amiodarone Other (See Comments)    Had a severe lung infection in 2003  . Antihistamines, Diphenhydramine-Type Other (See Comments)    Jittery    HOME MEDICATIONS: Outpatient Prescriptions Prior to Visit   Medication Sig Dispense Refill  . atorvastatin (LIPITOR) 40 MG tablet Take 40 mg by mouth daily.    . Cholecalciferol (VITAMIN D) 2000 UNITS CAPS Take 1 capsule by mouth daily.    [name] donepezil (ARICEPT) 5 MG tablet Take 2 tablets (10 mg total) by mouth as directed. Take 10 mg every day, devided into 5 mg in  morning and 5 mg each evening. 180 tablet 3  . finasteride (PROSCAR) 5 MG tablet Take 5 mg by mouth daily.    . metoprolol tartrate (LOPRESSOR) 25 MG tablet Take 0.5 tablets (12.5 mg total) by mouth 2 (two) times daily. 15 tablet 6  . Multiple Vitamin (MULTIVITAMIN) tablet Take 1 tablet by mouth daily.    [name] omeprazole (PRILOSEC) 20 MG capsule Take 20 mg by mouth daily.    [name] warfarin (COUMADIN) 4 MG tablet Take 4 mg by mouth daily.    [name] zolpidem (AMBIEN) 5 MG tablet Take 5 mg by mouth at bedtime as needed for sleep.    [name] digoxin (LANOXIN) 0.125 MG tablet Take 0.125 mg by mouth daily.     No facility-administered medications prior to visit.    PAST MEDICAL HISTORY: Past Medical History  Diagnosis Date  . HTN (hypertension)   . CAD (coronary artery disease)   . High cholesterol   . Mild dementia   . Memory loss   . Dementia     PAST SURGICAL  HISTORY: Past Surgical History  Procedure Laterality Date  . Rotator cuff surgery  1985  . Appendectomy    . Tonsillectomy    . Triple heart bypass    . Knee surgery    . Angiolplasty    . Pilonidal cyst excision    . Insert / replace / remove pacemaker      FAMILY HISTORY: Family History  Problem Relation Age of Onset  . Stroke Father   . Cervical cancer      siblings  . COPD Sister   . Dementia Brother         PHYSICAL EXAM  Filed Vitals:   01/26/15 1119  BP: 118/70  Pulse: 76  Resp: 20  Height: 5\' 8"  (1.727 m)  Weight: 166 lb (75.297 kg)   Body mass index is 25.25 kg/(m^2).  Generalized: Well developed, in no acute distress   Neurological examination  Mentation: Alert oriented to time, place, history  taking. Follows all commands speech and language fluent.  MOCA 20/30 MMSE 25/30 Cranial nerve , no loss of taste or smell, Pupils were equal round reactive to light. Extraocular movements were full, visual field were full on confrontational test. Facial sensation and strength were normal. Uvula tongue midline. Head turning and shoulder shrug  were normal and symmetric. Motor: The motor testing reveals 5 / 5 strength of all 4 extremities,  symmetric motor tone is noted throughout.  Sensory: Sensory testing is intact to soft touch on all 4 extremities. No evidence of extinction is noted.  Coordination: Cerebellar testing reveals good finger-nose-finger and heel-to-shin bilaterally.  Gait and station: Gait is normal. Tandem gait is unsteady.   Reflexes: Deep tendon reflexes are symmetric and normal bilaterally. Babinski down going.    DIAGNOSTIC DATA (LABS, IMAGING, TESTING) - I reviewed patient records, labs, notes, testing and imaging myself where available.   ASSESSMENT AND PLAN 81 y.o. year old male  has a past medical history of HTN (hypertension); CAD (coronary artery disease); High cholesterol; Mild dementia; Memory loss; and Dementia. here with:  1. Early Dementia, mild degree- just below normal, borderline for MCI- will hold off on Namenda.   Patient's memory score has remained stable. His MOCA  was in 2015 at 20/30.  The following visit MMSE 26/30. Now 25-30, MMSE only, did not perform MOCA. [name]   Patient will continue taking Aricept 10 mg at bedtime.  If his symptoms worsen or he develops new symptoms patient let us know. In than case Nameda will be added.  Otherwise he will follow-up in 6 months or sooner if needed.   01/26/2015, 11:47 AM [facility] [address], [place], [place] [address] [phone]  Note: This document was prepared with digital dictation and possible smart phrase technology. Any transcriptional errors that result from this process  are unintentional.

## 2015-02-10 DIAGNOSIS — I4891 Unspecified atrial fibrillation: Secondary | ICD-10-CM | POA: Diagnosis not present

## 2015-02-10 DIAGNOSIS — D225 Melanocytic nevi of trunk: Secondary | ICD-10-CM | POA: Diagnosis not present

## 2015-02-10 DIAGNOSIS — L814 Other melanin hyperpigmentation: Secondary | ICD-10-CM | POA: Diagnosis not present

## 2015-02-10 DIAGNOSIS — L821 Other seborrheic keratosis: Secondary | ICD-10-CM | POA: Diagnosis not present

## 2015-02-16 DIAGNOSIS — I495 Sick sinus syndrome: Secondary | ICD-10-CM | POA: Diagnosis not present

## 2015-02-24 DIAGNOSIS — I4891 Unspecified atrial fibrillation: Secondary | ICD-10-CM | POA: Diagnosis not present

## 2015-03-10 DIAGNOSIS — I4891 Unspecified atrial fibrillation: Secondary | ICD-10-CM | POA: Diagnosis not present

## 2015-03-17 ENCOUNTER — Encounter: Payer: Self-pay | Admitting: Cardiovascular Disease

## 2015-04-07 DIAGNOSIS — I4891 Unspecified atrial fibrillation: Secondary | ICD-10-CM | POA: Diagnosis not present

## 2015-04-21 DIAGNOSIS — Z23 Encounter for immunization: Secondary | ICD-10-CM | POA: Diagnosis not present

## 2015-04-27 DIAGNOSIS — Z961 Presence of intraocular lens: Secondary | ICD-10-CM | POA: Diagnosis not present

## 2015-04-27 DIAGNOSIS — Z0100 Encounter for examination of eyes and vision without abnormal findings: Secondary | ICD-10-CM | POA: Diagnosis not present

## 2015-05-05 DIAGNOSIS — I4891 Unspecified atrial fibrillation: Secondary | ICD-10-CM | POA: Diagnosis not present

## 2015-06-02 DIAGNOSIS — I4891 Unspecified atrial fibrillation: Secondary | ICD-10-CM | POA: Diagnosis not present

## 2015-06-07 ENCOUNTER — Encounter: Payer: Self-pay | Admitting: Cardiovascular Disease

## 2015-06-07 ENCOUNTER — Ambulatory Visit (INDEPENDENT_AMBULATORY_CARE_PROVIDER_SITE_OTHER): Payer: Medicare Other | Admitting: Cardiovascular Disease

## 2015-06-07 VITALS — BP 102/64 | HR 70 | Ht 69.0 in | Wt 164.6 lb

## 2015-06-07 DIAGNOSIS — I25118 Atherosclerotic heart disease of native coronary artery with other forms of angina pectoris: Secondary | ICD-10-CM

## 2015-06-07 DIAGNOSIS — F03918 Unspecified dementia, unspecified severity, with other behavioral disturbance: Secondary | ICD-10-CM

## 2015-06-07 DIAGNOSIS — I442 Atrioventricular block, complete: Secondary | ICD-10-CM

## 2015-06-07 DIAGNOSIS — Z950 Presence of cardiac pacemaker: Secondary | ICD-10-CM | POA: Diagnosis not present

## 2015-06-07 DIAGNOSIS — I441 Atrioventricular block, second degree: Secondary | ICD-10-CM | POA: Diagnosis not present

## 2015-06-07 DIAGNOSIS — F0391 Unspecified dementia with behavioral disturbance: Secondary | ICD-10-CM

## 2015-06-07 LAB — CUP PACEART INCLINIC DEVICE CHECK
Implantable Lead Implant Date: 20010830
Implantable Lead Location: 753860
Lead Channel Setting Pacing Pulse Width: 0.5 ms
MDC IDC LEAD IMPLANT DT: 20010830
MDC IDC LEAD LOCATION: 753859
MDC IDC SESS DTM: 20161229103349
MDC IDC SET LEADCHNL RV SENSING SENSITIVITY: 2 mV
Pulse Gen Model: 5826
Pulse Gen Serial Number: 1216574

## 2015-06-07 NOTE — Progress Notes (Signed)
Patient ID: [name] [name], male   DOB: 11-May-1926, 81 y.o.   MRN: [id number]     Cardiology Office Note   Date:  06/07/2015   ID:  [name], DOB 1925-11-18, MRN [id number]  PCP:  Mathews Argyle, MD  Cardiologist:   [name], MD   Chief Complaint  Patient presents with  . New Patient (Initial Visit)    no chest pain, no shortness of breath, no edema, no pain or cramping in legs, no lightheadedness or dizziness      History of Present Illness: [name] [name] is a 81 y.o. male who presents for  Follow-up for advanced atrioventricular block , pacemaker check , history of CAD S/P CABG , hyperlipidemia and permanent atrial fibrillation   There have been no significant changes in [name] [name] overall health. His dementia remains mild and progresses very slowly. He remains interactive and very pleasant. Short-term memory remains the only serious issue. His wife has noticed that he sleeps a lot during the day as well as at night. She has been documenting his blood pressure which is consistently in normal range, sometimes in the low normal range (minimum 102/56 mmHg). He denies dyspnea, angina, syncope, palpitations, bleeding problems, falls, new focal neurological deficits, claudication or lower extremity edema.  Pacemaker interrogation shows normal device function. He has a [employer] zephyr XL DR device that was implanted in 2009 and still has  7-8 years of estimated longevity. The leads are still the original ones are implanted in 2001. The device is programmed VVIR for permanent atrial arrhythmia. He presents today with ventricular paced rhythm at 70 bpm. He has an underlying idioventricular escape rhythm at 38 bpm. There have been no episodes of high ventricular rate recorded. Heart rate histogram is quite blunted but consistent with his very sedentary lifestyle.  He still does monthly battery checks with a company in [place].  He had bypass surgery in 2010 [name], LIMA to LAD,  SVG to distal circumflex, SVG to distal RCA) and has not had any signs or symptoms of coronary disease since that time.  He had a surgical maze ablation and ligation of left atrial appendage at that time. His nuclear stress test in August 2014 showed a dense inferolateral scar. Left ventricular ejection fraction was around 50%.   Past Medical History  Diagnosis Date  . HTN (hypertension)   . CAD (coronary artery disease)   . High cholesterol   . Mild dementia   . Memory loss   . Dementia     Past Surgical History  Procedure Laterality Date  . Rotator cuff surgery  1985  . Appendectomy    . Tonsillectomy    . Triple heart bypass    . Knee surgery    . Angiolplasty    . Pilonidal cyst excision    . Insert / replace / remove pacemaker       Current Outpatient Prescriptions  Medication Sig Dispense Refill  . atorvastatin (LIPITOR) 40 MG tablet Take 40 mg by mouth daily.    . Cholecalciferol (VITAMIN D) 2000 UNITS CAPS Take 1 capsule by mouth daily.    [name] donepezil (ARICEPT) 5 MG tablet Take 2 tablets (10 mg total) by mouth as directed. Take 10 mg every day, devided into 5 mg in  morning and 5 mg each evening. 180 tablet 3  . finasteride (PROSCAR) 5 MG tablet Take 5 mg by mouth daily.    . Multiple Vitamin (MULTIVITAMIN) tablet Take 1 tablet by mouth  daily.    . ranitidine (ZANTAC) 75 MG tablet Take 75 mg by mouth 2 (two) times daily.    [name] warfarin (COUMADIN) 4 MG tablet Take 4 mg by mouth daily.     No current facility-administered medications for this visit.    Allergies:   Amiodarone and Antihistamines, diphenhydramine-type    Social History:  The patient  reports that he quit smoking about 40 years ago. He has never used smokeless tobacco. He reports that he drinks alcohol. He reports that he does not use illicit drugs.   Family History:  The patient's family history includes COPD in his sister; Cervical cancer in an other family member; Dementia in his brother; Stroke in his  father.    ROS:  Please see the history of present illness.    Otherwise, review of systems positive for none.   All other systems are reviewed and negative.    PHYSICAL EXAM: VS:  BP 102/64 mmHg  Pulse 70  Ht 5\' 9"  (1.753 m)  Wt 164 lb 9 oz (74.645 kg)  BMI 24.29 kg/m2 , BMI Body mass index is 24.29 kg/(m^2).  General: Alert, oriented x3, no distress Head: no evidence of trauma, PERRL, EOMI, no exophtalmos or lid lag, no myxedema, no xanthelasma; normal ears, nose and oropharynx Neck: normal jugular venous pulsations and no hepatojugular reflux; brisk carotid pulses without delay and no carotid bruits Chest: clear to auscultation, no signs of consolidation by percussion or palpation, normal fremitus, symmetrical and full respiratory excursions; healthy left subclavian pacemaker site Cardiovascular: normal position and quality of the apical impulse, regular rhythm, normal first and  Paradoxically split second heart sounds, no murmurs, rubs or gallops Abdomen: no tenderness or distention, no masses by palpation, no abnormal pulsatility or arterial bruits, normal bowel sounds, no hepatosplenomegaly Extremities: no clubbing, cyanosis or edema; 2+ radial, ulnar and brachial pulses bilaterally; 2+ right femoral, posterior tibial and dorsalis pedis pulses; 2+ left femoral, posterior tibial and dorsalis pedis pulses; no subclavian or femoral bruits Neurological: grossly nonfocal Psych: euthymic mood, full affect   EKG:  EKG is ordered today. The ekg ordered today demonstrates  Background atypical atrial flutter with complete heart block and 100% ventricular pacing   Recent Labs: No results found for requested labs within last 365 days.    Lipid Panel No results found for: CHOL, TRIG, HDL, CHOLHDL, VLDL, LDLCALC, LDLDIRECT    Wt Readings from Last 3 Encounters:  06/07/15 164 lb 9 oz (74.645 kg)  01/26/15 166 lb (75.297 kg)  07/28/14 170 lb (77.111 kg)   .   ASSESSMENT AND  PLAN:  1.  Complete heart block. He should be considered pacemaker dependent, but he does have a slow idioventricular escape rhythm less than 40 bpm  2.  Permanent atrial flutter/atrial fibrillation. Technically, his embolic risk is very high. CHADSVasc score is 4 ( age 65 , LV dysfunction 1, vascular disease 1),  But he has had left atrial appendage ligation which probably offers at least partial protection. Since he does not have problems with falls or bleeding problems , continue warfarin for the foreseeable future.  3. Sleepiness/fatigue. We'll discontinue his beta blocker. He does not needed for ventricular rate control or ventricular arrhythmia , he does not have clinical congestive heart failure or angina.  4. CAD s/p CABG.  Asymptomatic. Known inferolateral scar by previous perfusion imaging. Borderline left ventricular systolic function, EF [id number]    Current medicines are reviewed at length with the patient today.  The  patient does not have concerns regarding medicines.  The following changes have been made:   Wean off metoprolol completely  Labs/ tests ordered today include:  No orders of the defined types were placed in this encounter.     Patient Instructions  Your physician has recommended you make the following change in your medication:   TAKE METOPROLOL ONE A DAY FOR THE NEXT TWO DAYS THEN STOP  Dr. Sallyanne Kuster recommends that you schedule a follow-up appointment in: [place] ([facility])     Mikael Spray, MD  06/07/2015 10:00 AM    [name], MD, [facility] [phone] office [phone] pager

## 2015-06-07 NOTE — Patient Instructions (Signed)
Your physician has recommended you make the following change in your medication:   TAKE METOPROLOL ONE A DAY FOR THE NEXT TWO DAYS THEN STOP  Dr. Sallyanne Kuster recommends that you schedule a follow-up appointment in: [place] ([place])

## 2015-06-08 NOTE — Addendum Note (Signed)
Addended by: Janett Labella A on: 06/08/2015 08:14 AM   Modules accepted: Orders

## 2015-06-23 DIAGNOSIS — I4891 Unspecified atrial fibrillation: Secondary | ICD-10-CM | POA: Diagnosis not present

## 2015-07-01 ENCOUNTER — Encounter: Payer: Self-pay | Admitting: Cardiovascular Disease

## 2015-07-05 ENCOUNTER — Ambulatory Visit (INDEPENDENT_AMBULATORY_CARE_PROVIDER_SITE_OTHER): Payer: Medicare Other | Admitting: Nurse Practitioner

## 2015-07-05 ENCOUNTER — Encounter: Payer: Self-pay | Admitting: Nurse Practitioner

## 2015-07-05 VITALS — BP 122/63 | HR 74 | Ht 67.5 in | Wt 165.2 lb

## 2015-07-05 DIAGNOSIS — F0280 Dementia in other diseases classified elsewhere without behavioral disturbance: Secondary | ICD-10-CM

## 2015-07-05 DIAGNOSIS — G309 Alzheimer's disease, unspecified: Secondary | ICD-10-CM

## 2015-07-05 NOTE — Progress Notes (Addendum)
[facility]  PATIENT: [name] [name] DOB: [birth year]   REASON FOR VISIT: Follow-up for Alzheimer's dementia HISTORY FROM: Patient and wife    HISTORY OF PRESENT ILLNESS: [name] [name], 82 year old male returns for follow-up. He has history of Alzheimer's dementia. He continues to be able to complete his ADLs independently, he does not have a driver's license. He is currently on Aricept 5 mg twice a day. His memory score is stable on Aricept alone. He gets his medications from the [place]. He does not exercise his memory  with puzzles or  crosswords etc. He gets no regular exercise. He returns for reevaluation    HISTORY: [name] [name] is a 82 year old male with a history of dementia. He returns today for follow-up. He is currently taking Aricept 10 mg and tolerating it well. The patient states that his memory is "good." his wife reports that she feels that it has remained the same. The patient is able to complete all ADLs independently.  The patient has a driver's licenses but no longer drives. His wife does all the driving.  He denies having to give up anything due to his memory. The wife states that the patient's behavior has not been an issue.  On 7-20 7-16 the patient scored 25 out of 30 points and the Mini-Mental Status Examination for the last 12 months he has been very stable on Aricept alone. [name], his primary care physician, asked if Namenda could be added and indeed it could be. I am often combined the 2 medications Aricept and Namenda and they can be taken in the generic form or even in a combination product as one pill called Namziric. I will start [name] [name] on a starter pack for once a day namenda.  He is interested in stopping protonix, and he may get by with Tumms at night. A trial of 4-6 weeks would suffice to show if he is still having reflux.   REVIEW OF SYSTEMS: Full 14 system review of systems performed and notable only for those listed, all others are  neg:  Constitutional: Fatigue  Cardiovascular: neg Ear/Nose/Throat: neg  Skin: neg Eyes: neg Respiratory: neg Gastroitestinal: neg  Hematology/Lymphatic: Easy bruising Endocrine: neg Musculoskeletal:neg Allergy/Immunology: neg Neurological: Memory loss Psychiatric: neg Sleep : neg   ALLERGIES: Allergies  Allergen Reactions  . Amiodarone Other (See Comments)    Had a severe lung infection in 2003  . Antihistamines, Diphenhydramine-Type Other (See Comments)    Jittery    HOME MEDICATIONS: Outpatient Prescriptions Prior to Visit  Medication Sig Dispense Refill  . atorvastatin (LIPITOR) 40 MG tablet Take 40 mg by mouth daily.    . Cholecalciferol (VITAMIN D) 2000 UNITS CAPS Take 1 capsule by mouth daily.    [name] donepezil (ARICEPT) 5 MG tablet Take 2 tablets (10 mg total) by mouth as directed. Take 10 mg every day, devided into 5 mg in  morning and 5 mg each evening. 180 tablet 3  . finasteride (PROSCAR) 5 MG tablet Take 5 mg by mouth daily.    . Multiple Vitamin (MULTIVITAMIN) tablet Take 1 tablet by mouth daily.    . ranitidine (ZANTAC) 75 MG tablet Take 75 mg by mouth 2 (two) times daily.    [name] warfarin (COUMADIN) 4 MG tablet Take 4 mg by mouth daily.     No facility-administered medications prior to visit.    PAST MEDICAL HISTORY: Past Medical History  Diagnosis Date  . HTN (hypertension)   . CAD (coronary artery disease)   .  High cholesterol   . Mild dementia   . Memory loss   . Dementia     PAST SURGICAL HISTORY: Past Surgical History  Procedure Laterality Date  . Rotator cuff surgery  1985  . Appendectomy    . Tonsillectomy    . Triple heart bypass    . Knee surgery    . Angiolplasty    . Pilonidal cyst excision    . Insert / replace / remove pacemaker      FAMILY HISTORY: Family History  Problem Relation Age of Onset  . Stroke Father   . Cervical cancer      siblings  . COPD Sister   . Dementia Brother     SOCIAL HISTORY: Social History    Social History  . Marital Status: Married    Spouse Name: [name] [name]  . Number of Children: 6  . Years of Education: college   Occupational History  . Not on file.   Social History Main Topics  . Smoking status: Former Smoker    Quit date: 07/02/1974  . Smokeless tobacco: Never Used  . Alcohol Use: Yes     Comment: 2-3 "stiff" drinks per day  . Drug Use: No  . Sexual Activity: Not on file   Other Topics Concern  . Not on file   Social History Narrative   Patient is married [name] [name]) and lives at home with his wife.   Patient has a college education.   Patient is a retired Materials engineer.   Patient does not drink any caffeine.   Patient has six children.     PHYSICAL EXAM  Filed Vitals:   07/05/15 1052  BP: 122/63  Pulse: 74  Height: 5' 7.5" (1.715 m)  Weight: 165 lb 3.2 oz (74.934 kg)   Body mass index is 25.48 kg/(m^2). Generalized: Well developed, in no acute distress   Neurological examination  Mentation: Alert oriented to time, place, history taking. Follows all commands speech and language fluent. MMSE 24/30. AFT 10. Clock drawing 4/4. Last MMSE 25/30 Cranial nerve ,  Pupils were equal round reactive to light. Extraocular movements were full, visual field were full on confrontational test. Facial sensation and strength were normal. Uvula tongue midline. Head turning and shoulder shrug were normal and symmetric. Motor: The motor testing reveals 5 / 5 strength of all 4 extremities, symmetric motor tone is noted throughout.  Sensory: Sensory testing is intact to soft touch on all 4 extremities. No evidence of extinction is noted.  Coordination: Cerebellar testing reveals good finger-nose-finger and heel-to-shin bilaterally.  Gait and station: Posture is stooped ,Gait is normal. Tandem gait is unsteady.No assistive device  Reflexes: Deep tendon reflexes are symmetric and normal bilaterally. Babinski down going.   DIAGNOSTIC DATA (LABS,  IMAGING, TESTING) -  ASSESSMENT AND PLAN  82 y.o. year old male  has a past medical history of HTN (hypertension); CAD (coronary artery disease); High cholesterol; Mild dementia; Memory loss; and Dementia. here to follow-up his memory score is stable. The patient is a current patient of Dr. Brett Fairy  who is out of the office today . This note is sent to the work in doctor.     Continue Aricept at current dose meds from [place] a safe environment, remove locks on bathroom  doors Reduce confusion, keep familiar objects and people around, stick to a routine Use effective communication such as simple words and short sentences Reduce nighttime restlessness, a consistent nighttime routine,  avoid napping during the  day Encourage good nutrition and hydration Seek medical care for acute worsening confusion or fever, this usually indicates infection Follow up in 6 months Dennie Bible, [facility], [facility], APRN  [facility] [address], [place], [place] [address] [phone]  Personally examined images, and have participated in and made any corrections needed to history, physical, neuro exam,assessment and plan as stated above.  I have personally reviewed the history, evaluated lab date, reviewed imaging studies and agree with radiology interpretations.    Sarina Ill, MD [facility]

## 2015-07-05 NOTE — Patient Instructions (Signed)
Continue Aricept at current dose meds from [place] Follow up in 6 months

## 2015-07-18 DIAGNOSIS — I4891 Unspecified atrial fibrillation: Secondary | ICD-10-CM | POA: Diagnosis not present

## 2015-07-29 ENCOUNTER — Other Ambulatory Visit: Payer: Self-pay | Admitting: Geriatric Medicine

## 2015-07-29 DIAGNOSIS — G301 Alzheimer's disease with late onset: Secondary | ICD-10-CM | POA: Diagnosis not present

## 2015-07-29 DIAGNOSIS — I10 Essential (primary) hypertension: Secondary | ICD-10-CM | POA: Diagnosis not present

## 2015-07-29 DIAGNOSIS — R1032 Left lower quadrant pain: Secondary | ICD-10-CM | POA: Diagnosis not present

## 2015-07-29 DIAGNOSIS — E7800 Pure hypercholesterolemia, unspecified: Secondary | ICD-10-CM | POA: Diagnosis not present

## 2015-07-29 DIAGNOSIS — Z79899 Other long term (current) drug therapy: Secondary | ICD-10-CM | POA: Diagnosis not present

## 2015-07-29 DIAGNOSIS — Z0000 Encounter for general adult medical examination without abnormal findings: Secondary | ICD-10-CM | POA: Diagnosis not present

## 2015-07-29 DIAGNOSIS — I481 Persistent atrial fibrillation: Secondary | ICD-10-CM | POA: Diagnosis not present

## 2015-08-01 ENCOUNTER — Ambulatory Visit
Admission: RE | Admit: 2015-08-01 | Discharge: 2015-08-01 | Disposition: A | Discharge status: Home / Self Care | Payer: Medicare Other | Source: Ambulatory Visit | Attending: Geriatric Medicine | Admitting: Geriatric Medicine

## 2015-08-01 DIAGNOSIS — N200 Calculus of kidney: Secondary | ICD-10-CM | POA: Diagnosis not present

## 2015-08-01 DIAGNOSIS — R1032 Left lower quadrant pain: Secondary | ICD-10-CM

## 2015-08-01 MED ORDER — IOPAMIDOL (ISOVUE-300) INJECTION 61%
100.0000 mL | Freq: Once | INTRAVENOUS | Status: AC | PRN
  Administered 2015-08-01: 100 mL via INTRAVENOUS

## 2015-08-03 DIAGNOSIS — N200 Calculus of kidney: Secondary | ICD-10-CM | POA: Diagnosis not present

## 2015-08-03 DIAGNOSIS — Z0000 Encounter for general adult medical examination without abnormal findings: Secondary | ICD-10-CM | POA: Diagnosis not present

## 2015-08-09 ENCOUNTER — Telehealth: Payer: Self-pay | Admitting: *Deleted

## 2015-08-09 NOTE — Telephone Encounter (Signed)
Seeing Dr. Louis Meckel at [facility] 08/18/15 for consultation to determine if a procedure is needed for his kidney stones.  Wife will keep [place] updated.

## 2015-08-17 DIAGNOSIS — I4891 Unspecified atrial fibrillation: Secondary | ICD-10-CM | POA: Diagnosis not present

## 2015-08-18 DIAGNOSIS — N200 Calculus of kidney: Secondary | ICD-10-CM | POA: Diagnosis not present

## 2015-08-24 ENCOUNTER — Telehealth: Payer: Self-pay | Admitting: Cardiovascular Disease

## 2015-08-24 NOTE — Telephone Encounter (Signed)
Left msg for caller.

## 2015-08-24 NOTE — Telephone Encounter (Signed)
No ringtone or answer, call appeared to connect and drop.

## 2015-08-24 NOTE — Telephone Encounter (Signed)
Returned pt's wife's call. She reports recent eval by urology for kidney stones - pt has 2 stones, they prefer to take no action on these at this time.  She also notes he had fall ~10 days ago. Had some soreness/contusion on left side and this was very noticeable for the first day or so, but has been improving.  She notes no acute issues with balance or dizziness, etc. Does note that this was the 2nd fall in 2 years - last time he had some cracked ribs from injury to back.  When she mentioned the fall to his urologist, they advised to seek Dr. Victorino December counsel on whether to consider any med changes (i.e. Warfarin).  Wife notes she is comfortable w/ whatever Dr. Sallyanne Kuster advises, including keeping current med therapy.  Routed for review.

## 2015-08-24 NOTE — Telephone Encounter (Signed)
New message      Calling to give an update to the nurse regarding kidney stone and urology visit

## 2015-08-24 NOTE — Telephone Encounter (Signed)
While the falls are concerning, I still believe the risk/benefit ratio is in favor of continuing treatment with warfarin. She is to be extremely cautious and avoid falls, especially injuries to his head. Please ask him to report any new falls promptly. As far as the kidney stones are concerned, it would be okay to temporarily interrupt anticoagulation if a procedure is planned to remove them.

## 2015-08-24 NOTE — Telephone Encounter (Signed)
Follow up      Returned a call to the nurse.  Wife left her home number instead of cell phone number

## 2015-09-07 DIAGNOSIS — I4891 Unspecified atrial fibrillation: Secondary | ICD-10-CM | POA: Diagnosis not present

## 2015-10-05 DIAGNOSIS — I4891 Unspecified atrial fibrillation: Secondary | ICD-10-CM | POA: Diagnosis not present

## 2015-10-19 DIAGNOSIS — I4891 Unspecified atrial fibrillation: Secondary | ICD-10-CM | POA: Diagnosis not present

## 2015-11-02 DIAGNOSIS — I4891 Unspecified atrial fibrillation: Secondary | ICD-10-CM | POA: Diagnosis not present

## 2015-11-25 DIAGNOSIS — I4891 Unspecified atrial fibrillation: Secondary | ICD-10-CM | POA: Diagnosis not present

## 2015-12-20 DIAGNOSIS — I4891 Unspecified atrial fibrillation: Secondary | ICD-10-CM | POA: Diagnosis not present

## 2016-01-04 ENCOUNTER — Encounter: Payer: Self-pay | Admitting: Nurse Practitioner

## 2016-01-04 ENCOUNTER — Ambulatory Visit (INDEPENDENT_AMBULATORY_CARE_PROVIDER_SITE_OTHER): Payer: Medicare Other | Admitting: Nurse Practitioner

## 2016-01-04 VITALS — BP 100/58 | HR 76 | Ht 67.5 in | Wt 162.6 lb

## 2016-01-04 DIAGNOSIS — F0280 Dementia in other diseases classified elsewhere without behavioral disturbance: Secondary | ICD-10-CM

## 2016-01-04 DIAGNOSIS — G309 Alzheimer's disease, unspecified: Secondary | ICD-10-CM | POA: Diagnosis not present

## 2016-01-04 NOTE — Progress Notes (Signed)
[facility]  PATIENT: [name] [name] DOB: [birth year]   REASON FOR VISIT: follow-up for memory loss/dementia HISTORY FROM:patient and wife    HISTORY OF PRESENT ILLNESS:[name] [name], 82 year old male returns for follow-up. He has history of Alzheimer's dementia. He continues to be able to complete his ADLs independently, he does not have a driver's license. He is currently on Aricept 5 mg twice a day. His memory score is stable on Aricept alone. He gets his medications from the [place]. He does not exercise his memory with puzzles or crosswords etc. He gets no regular exercise. He returns for reevaluation. No new neurologic complaints. No recent falls    HISTORY: [name] [name] is a 82 year old male with a history of dementia. He returns today for follow-up. He is currently taking Aricept 10 mg and tolerating it well. The patient states that his memory is "good." his wife reports that she feels that it has remained the same. The patient is able to complete all ADLs independently.  The patient has a driver's licenses but no longer drives. His wife does all the driving.  He denies having to give up anything due to his memory. The wife states that the patient's behavior has not been an issue.  On 7-20 7-16 the patient scored 25 out of 30 points and the Mini-Mental Status Examination for the last 12 months he has been very stable on Aricept alone. [name], his primary care physician, asked if Namenda could be added and indeed it could be. I am often combined the 2 medications Aricept and Namenda and they can be taken in the generic form or even in a combination product as one pill called Namziric. I will start [name] [name] on a starter pack for once a day namenda.  He is interested in stopping protonix, and he may get by with Tumms at night. A trial of 4-6 weeks would suffice to show if he is still having reflux.    REVIEW OF SYSTEMS: Full 14 system review of systems performed  and notable only for those listed, all others are neg:  Constitutional: neg  Cardiovascular: neg Ear/Nose/Throat: neg  Skin: neg Eyes: neg Respiratory: neg Gastroitestinal: neg  Genitourinary kidney stone in the left kidney Hematology/Lymphatic: easy bruising Endocrine: neg Musculoskeletal:neg Allergy/Immunology: neg Neurological: memory loss Psychiatric: neg Sleep : neg   ALLERGIES: Allergies  Allergen Reactions  . Amiodarone Other (See Comments)    Had a severe lung infection in 2003  . Antihistamines, Diphenhydramine-Type Other (See Comments)    Jittery    HOME MEDICATIONS: Outpatient Prescriptions Prior to Visit  Medication Sig Dispense Refill  . atorvastatin (LIPITOR) 40 MG tablet Take 40 mg by mouth daily.    . Cholecalciferol (VITAMIN D) 2000 UNITS CAPS Take 1 capsule by mouth daily.    [name] donepezil (ARICEPT) 5 MG tablet Take 2 tablets (10 mg total) by mouth as directed. Take 10 mg every day, devided into 5 mg in  morning and 5 mg each evening. 180 tablet 3  . finasteride (PROSCAR) 5 MG tablet Take 5 mg by mouth daily.    . Multiple Vitamin (MULTIVITAMIN) tablet Take 1 tablet by mouth daily.    . ranitidine (ZANTAC) 75 MG tablet Take 75 mg by mouth 2 (two) times daily.    [name] warfarin (COUMADIN) 4 MG tablet Take 4 mg by mouth daily.     No facility-administered medications prior to visit.    PAST MEDICAL HISTORY: Past Medical History  Diagnosis Date  .  HTN (hypertension)   . CAD (coronary artery disease)   . High cholesterol   . Mild dementia   . Memory loss   . Dementia     PAST SURGICAL HISTORY: Past Surgical History  Procedure Laterality Date  . Rotator cuff surgery  1985  . Appendectomy    . Tonsillectomy    . Triple heart bypass    . Knee surgery    . Angiolplasty    . Pilonidal cyst excision    . Insert / replace / remove pacemaker      FAMILY HISTORY: Family History  Problem Relation Age of Onset  . Stroke Father   . Cervical cancer       siblings  . COPD Sister   . Dementia Brother     SOCIAL HISTORY: Social History   Social History  . Marital Status: Married    Spouse Name: [name]  . Number of Children: 6  . Years of Education: college   Occupational History  . Not on file.   Social History Main Topics  . Smoking status: Former Smoker    Quit date: 07/02/1974  . Smokeless tobacco: Never Used  . Alcohol Use: Yes     Comment: 2-3 "stiff" drinks per day  . Drug Use: No  . Sexual Activity: Not on file   Other Topics Concern  . Not on file   Social History Narrative   Patient is married [name]) and lives at home with his wife.   Patient has a college education.   Patient is a retired Materials engineer.   Patient does not drink any caffeine.   Patient has six children.     PHYSICAL EXAM  Filed Vitals:   01/04/16 1054  BP: 100/58  Pulse: 76  Height: 5' 7.5" (1.715 m)  Weight: 162 lb 9.6 oz (73.755 kg)   Body mass index is 25.08 kg/(m^2). Generalized: Well developed, in no acute distress   Neurological examination  Mentation: Alert oriented to time, place, history taking. Follows all commands speech and language fluent. MMSE 24/30. AFT 3. Clock drawing 4/4. Last MMSE 24/30 Cranial nerve , Pupils were equal round reactive to light. Extraocular movements were full, visual field were full on confrontational test. Facial sensation and strength were normal. Uvula tongue midline. Head turning and shoulder shrug were normal and symmetric. Motor: The motor testing reveals 5 / 5 strength of all 4 extremities, symmetric motor tone is noted throughout.  Sensory: Sensory testing is intact to soft touch on all 4 extremities. No evidence of extinction is noted.  Coordination: Cerebellar testing reveals good finger-nose-finger and heel-to-shin bilaterally.  Gait and station: Posture is stooped ,Gait is normal. Tandem gait is unsteady.No assistive device  Reflexes: Deep tendon reflexes are  symmetric and normal bilaterally. Babinski down going  DIAGNOSTIC DATA (LABS, IMAGING, TESTING)  ASSESSMENT AND PLAN 82 y.o. year old male  has a past medical history of HTN (hypertension); CAD (coronary artery disease); High cholesterol;  Memory loss; and Dementia. here to follow-up . His memory score is stable. The patient is a current patient of Dr. Brett Fairy who is out of the office today . This note is sent to the work in doctor.   Continue Aricept at current dose meds from [place] Follow up in 6 months next with Dr. Brett Fairy Dennie Bible, [facility], [facility], [facility] [address], [place], [place] [address] [phone]

## 2016-01-04 NOTE — Patient Instructions (Addendum)
Continue Aricept at current dose meds from [place] Follow up in 6 months

## 2016-01-10 NOTE — Progress Notes (Signed)
I agree with the above plan 

## 2016-01-17 DIAGNOSIS — I4891 Unspecified atrial fibrillation: Secondary | ICD-10-CM | POA: Diagnosis not present

## 2016-01-26 ENCOUNTER — Ambulatory Visit
Admission: RE | Admit: 2016-01-26 | Discharge: 2016-01-26 | Disposition: A | Discharge status: Home / Self Care | Payer: Medicare Other | Source: Ambulatory Visit | Attending: Geriatric Medicine | Admitting: Geriatric Medicine

## 2016-01-26 ENCOUNTER — Other Ambulatory Visit: Payer: Self-pay | Admitting: Geriatric Medicine

## 2016-01-26 DIAGNOSIS — R062 Wheezing: Secondary | ICD-10-CM

## 2016-01-26 DIAGNOSIS — R634 Abnormal weight loss: Secondary | ICD-10-CM | POA: Diagnosis not present

## 2016-01-26 DIAGNOSIS — I10 Essential (primary) hypertension: Secondary | ICD-10-CM | POA: Diagnosis not present

## 2016-01-26 DIAGNOSIS — G301 Alzheimer's disease with late onset: Secondary | ICD-10-CM | POA: Diagnosis not present

## 2016-01-26 DIAGNOSIS — I481 Persistent atrial fibrillation: Secondary | ICD-10-CM | POA: Diagnosis not present

## 2016-01-26 DIAGNOSIS — J449 Chronic obstructive pulmonary disease, unspecified: Secondary | ICD-10-CM | POA: Diagnosis not present

## 2016-01-27 ENCOUNTER — Other Ambulatory Visit: Payer: Self-pay | Admitting: Geriatric Medicine

## 2016-01-27 DIAGNOSIS — Z79899 Other long term (current) drug therapy: Secondary | ICD-10-CM | POA: Diagnosis not present

## 2016-01-27 DIAGNOSIS — R911 Solitary pulmonary nodule: Secondary | ICD-10-CM

## 2016-02-03 ENCOUNTER — Other Ambulatory Visit: Payer: Medicare Other

## 2016-02-03 ENCOUNTER — Ambulatory Visit
Admission: RE | Admit: 2016-02-03 | Discharge: 2016-02-03 | Disposition: A | Discharge status: Home / Self Care | Payer: Medicare Other | Source: Ambulatory Visit | Attending: Geriatric Medicine | Admitting: Geriatric Medicine

## 2016-02-03 DIAGNOSIS — J449 Chronic obstructive pulmonary disease, unspecified: Secondary | ICD-10-CM | POA: Diagnosis not present

## 2016-02-03 DIAGNOSIS — R911 Solitary pulmonary nodule: Secondary | ICD-10-CM

## 2016-02-03 MED ORDER — IOPAMIDOL (ISOVUE-300) INJECTION 61%
75.0000 mL | Freq: Once | INTRAVENOUS | Status: AC | PRN
  Administered 2016-02-03: 75 mL via INTRAVENOUS

## 2016-02-07 ENCOUNTER — Encounter (INDEPENDENT_AMBULATORY_CARE_PROVIDER_SITE_OTHER): Payer: Self-pay

## 2016-02-07 ENCOUNTER — Encounter: Payer: Self-pay | Admitting: Cardiovascular Disease

## 2016-02-07 ENCOUNTER — Ambulatory Visit (INDEPENDENT_AMBULATORY_CARE_PROVIDER_SITE_OTHER): Payer: Medicare Other | Admitting: Cardiovascular Disease

## 2016-02-07 VITALS — BP 102/64 | HR 70 | Ht 70.0 in | Wt 160.0 lb

## 2016-02-07 DIAGNOSIS — I25118 Atherosclerotic heart disease of native coronary artery with other forms of angina pectoris: Secondary | ICD-10-CM

## 2016-02-07 DIAGNOSIS — F0280 Dementia in other diseases classified elsewhere without behavioral disturbance: Secondary | ICD-10-CM

## 2016-02-07 DIAGNOSIS — I442 Atrioventricular block, complete: Secondary | ICD-10-CM

## 2016-02-07 DIAGNOSIS — I4891 Unspecified atrial fibrillation: Secondary | ICD-10-CM | POA: Diagnosis not present

## 2016-02-07 DIAGNOSIS — G309 Alzheimer's disease, unspecified: Secondary | ICD-10-CM

## 2016-02-07 DIAGNOSIS — Z950 Presence of cardiac pacemaker: Secondary | ICD-10-CM | POA: Diagnosis not present

## 2016-02-07 LAB — CUP PACEART INCLINIC DEVICE CHECK
Brady Statistic RV Percent Paced: 98 %
Implantable Lead Implant Date: 20010830
Implantable Lead Implant Date: 20010830
Implantable Lead Location: 753860
Lead Channel Impedance Value: 524 Ohm
Lead Channel Pacing Threshold Amplitude: 0.5 V
Lead Channel Sensing Intrinsic Amplitude: 5.1 mV
Lead Channel Setting Pacing Pulse Width: 0.5 ms
Lead Channel Setting Sensing Sensitivity: 2 mV
MDC IDC LEAD LOCATION: 753859
MDC IDC MSMT BATTERY IMPEDANCE: 1900 Ohm
MDC IDC MSMT BATTERY VOLTAGE: 2.78 V
MDC IDC MSMT LEADCHNL RV PACING THRESHOLD PULSEWIDTH: 0.5 ms
MDC IDC SESS DTM: 20170808110456
Pulse Gen Serial Number: 1216574

## 2016-02-07 NOTE — Patient Instructions (Signed)
Dr Croitoru recommends that you schedule a follow-up appointment in 6 months. You will receive a reminder letter in the mail two months in advance. If you don't receive a letter, please call our office to schedule the follow-up appointment.  If you need a refill on your cardiac medications before your next appointment, please call your pharmacy. 

## 2016-02-07 NOTE — Progress Notes (Signed)
Cardiology Office Note    Date:  02/07/2016   ID:  [name] [name], DOB Jun 19, 1926, MRN [id number]  PCP:  [name] Argyle, MD  Cardiologist:   [name] Klein, MD   Chief Complaint  Patient presents with  . Follow-up    6 MONTHS    History of Present Illness:  [name] [name] is a 82 y.o. male with complete heart block (pacemaker dependent), permanent atrial fibrillation, CAD s/p CABG, Alzheimer's dementia, hypertension and hyperlipidemia here for follow-up on his pacemaker (2009, [employer] Zephyr XL DR, programmed VVIR).  He feels well and does not have any complaints of dyspnea, angina, dizziness, palpitations, leg edema, claudication or new focal neurological complaints. His memory is slowly deteriorating but he is still quite pleasant and conversant. His wife is an excellent caregiver.  He recently underwent a chest CT with contrast for evaluation of a lung nodule and was incidentally found to have a 4.0 cm ascending aortic aneurysm as well as severe emphysematous lung changes and a left kidney stone.  Interrogation of his pacemaker shows normal device function. The device is programmed VVIR for permanent atrial fibrillation and he has 98% ventricular pacing despite the fact that he is not on any rate control medications. The estimated battery longevity is 5-6 years. He has an underlying slow rhythm between 36 and 68 bpm. No episodes of high ventricular rate are recorded.  He had bypass surgery in 2010 [name] [name], LIMA to LAD, SVG to distal circumflex, SVG to distal RCA) and has not had any signs or symptoms of coronary disease since that time.  He had a surgical maze ablation and ligation of left atrial appendage at that time. His nuclear stress test in August 2014 showed a dense inferolateral scar. Left ventricular ejection fraction was around 50%.   Past Medical History:  Diagnosis Date  . CAD (coronary artery disease)   . Dementia   . High cholesterol   . HTN (hypertension)     . Memory loss   . Mild dementia     Past Surgical History:  Procedure Laterality Date  . angiolplasty    . APPENDECTOMY    . INSERT / REPLACE / [place]    . PILONIDAL CYST EXCISION    . rotator cuff surgery  1985  . TONSILLECTOMY    . triple heart bypass      Current Medications: Outpatient Medications Prior to Visit  Medication Sig Dispense Refill  . atorvastatin (LIPITOR) 40 MG tablet Take 40 mg by mouth daily.    . Cholecalciferol (VITAMIN D) 2000 UNITS CAPS Take 1 capsule by mouth daily.    [name] donepezil (ARICEPT) 5 MG tablet Take 2 tablets (10 mg total) by mouth as directed. Take 10 mg every day, devided into 5 mg in  morning and 5 mg each evening. 180 tablet 3  . finasteride (PROSCAR) 5 MG tablet Take 5 mg by mouth daily.    . Multiple Vitamin (MULTIVITAMIN) tablet Take 1 tablet by mouth daily.    . ranitidine (ZANTAC) 75 MG tablet Take 75 mg by mouth 2 (two) times daily.    [name] warfarin (COUMADIN) 4 MG tablet Take 4 mg by mouth daily. Taking 4 mg for 2 days the 5mg  for one day then repeat.     No facility-administered medications prior to visit.      Allergies:   Amiodarone and Antihistamines, diphenhydramine-type   Social History   Social History  . Marital  status: Married    Spouse name: [name] [name]  . Number of children: 6  . Years of education: college   Social History Main Topics  . Smoking status: Former Smoker    Quit date: 07/02/1974  . Smokeless tobacco: Never Used  . Alcohol use Yes     Comment: 2-3 "stiff" drinks per day  . Drug use: No  . Sexual activity: Not Asked   Other Topics Concern  . None   Social History Narrative   Patient is married Artist) and lives at home with his wife.   Patient has a college education.   Patient is a retired Materials engineer.   Patient does not drink any caffeine.   Patient has six children.     Family History:  The patient's family history includes COPD in his sister;  Dementia in his brother; Stroke in his father.   ROS:   Please see the history of present illness.    ROS All other systems reviewed and are negative.   PHYSICAL EXAM:   VS:  BP 102/64   Pulse 70   Ht 5\' 10"  (1.778 m)   Wt 160 lb (72.6 kg)   BMI 22.96 kg/m    GEN: Well nourished, well developed, in no acute distress  HEENT: normal  Neck: no JVD, carotid bruits, or masses Cardiac: Paradoxically split second heart sound, RRR; no murmurs, rubs, or gallops,no edema, healthy subclavian pacemaker site  Respiratory:  clear to auscultation bilaterally, normal work of breathing GI: soft, nontender, nondistended, + BS MS: no deformity or atrophy  Skin: warm and dry, no rash Neuro:  Alert and Oriented x 3, Strength and sensation are intact Psych: euthymic mood, full affect  Wt Readings from Last 3 Encounters:  02/07/16 160 lb (72.6 kg)  01/04/16 162 lb 9.6 oz (73.8 kg)  07/05/15 165 lb 3.2 oz (74.9 kg)      Studies/Labs Reviewed:   EKG:  EKG is ordered today.  The ekg ordered today demonstrates Atrial fibrillation and ventricular paced rhythm, QTC 486 ms   ASSESSMENT:    1. CHB (complete heart block) (HCC)   2. Atrial fibrillation with slow ventricular response ([place])   3. Pacemaker   4. Coronary artery disease involving native coronary artery of native heart with other form of angina pectoris ([place])   5. Alzheimer's dementia without behavioral disturbance, unspecified timing of dementia onset      PLAN:  In order of problems listed above:  1. CHB: He is not technically pacemaker dependent, but has a very high frequency of ventricular pacing. For safety should be considered device dependent. His pacemaker is a dual-chamber device to was initially implanted for high-grade second-degree AV block in 2009, now programmed VVIR for his chronic atrial fibrillation 2. AFib: Well rate controlled without specific medication. On warfarin anticoagulation. Notes that he does have history of  surgical clipping of the left atrial appendage. If bleeding becomes an issue or concern, he should be at a lower than average risk for embolic events. 3. CAD s/p CABG: Currently asymptomatic. I don't have his recent lipid profile, but he is taking a relatively high dose of statin. He is not on aspirin due to anticoagulation therapy. He has a known inferolateral scar and borderline LV ejection fraction of 50%. He has never had clinical manifestations of heart failure. 4. PM: Normal device function. Unfortunately it is not amenable to remote downloads. Will perform in office checks every 6 months    Medication Adjustments/Labs  and Tests Ordered: Current medicines are reviewed at length with the patient today.  Concerns regarding medicines are outlined above.  Medication changes, Labs and Tests ordered today are listed in the Patient Instructions below. Patient Instructions  Dr Sallyanne Kuster recommends that you schedule a follow-up appointment in 6 months. You will receive a reminder letter in the mail two months in advance. If you don't receive a letter, please call our office to schedule the follow-up appointment.  If you need a refill on your cardiac medications before your next appointment, please call your pharmacy.    Signed, [name] Klein, MD  02/07/2016 3:50 PM    [facility] [place], [place], [place]  [address] Phone: [phone]; Fax: [phone]

## 2016-02-09 DIAGNOSIS — D225 Melanocytic nevi of trunk: Secondary | ICD-10-CM | POA: Diagnosis not present

## 2016-02-09 DIAGNOSIS — L570 Actinic keratosis: Secondary | ICD-10-CM | POA: Diagnosis not present

## 2016-02-09 DIAGNOSIS — L821 Other seborrheic keratosis: Secondary | ICD-10-CM | POA: Diagnosis not present

## 2016-02-13 ENCOUNTER — Encounter: Payer: Self-pay | Admitting: Cardiovascular Disease

## 2016-02-14 DIAGNOSIS — I4891 Unspecified atrial fibrillation: Secondary | ICD-10-CM | POA: Diagnosis not present

## 2016-02-28 DIAGNOSIS — I4891 Unspecified atrial fibrillation: Secondary | ICD-10-CM | POA: Diagnosis not present

## 2016-03-20 ENCOUNTER — Telehealth: Payer: Self-pay | Admitting: Cardiovascular Disease

## 2016-03-20 DIAGNOSIS — I4891 Unspecified atrial fibrillation: Secondary | ICD-10-CM | POA: Diagnosis not present

## 2016-03-20 NOTE — Telephone Encounter (Signed)
New Message  Pts wife voiced no one has called pt about phone pacer check and if it's something MD-Croitoru requested.  Please f/u with pts wife

## 2016-03-20 NOTE — Telephone Encounter (Signed)
Spoke with [name] [name]. She wanted to clarify that Mr. Neto did not need monthly telephone checks for his PPM. I informed her that his note stated that he will be fine to have his PPM checks every 6 months (battery life in August was at least 4 years). She verbalizes understanding.

## 2016-03-27 DIAGNOSIS — R634 Abnormal weight loss: Secondary | ICD-10-CM | POA: Diagnosis not present

## 2016-03-27 DIAGNOSIS — G301 Alzheimer's disease with late onset: Secondary | ICD-10-CM | POA: Diagnosis not present

## 2016-03-27 DIAGNOSIS — Z23 Encounter for immunization: Secondary | ICD-10-CM | POA: Diagnosis not present

## 2016-03-27 DIAGNOSIS — J449 Chronic obstructive pulmonary disease, unspecified: Secondary | ICD-10-CM | POA: Diagnosis not present

## 2016-03-27 DIAGNOSIS — I481 Persistent atrial fibrillation: Secondary | ICD-10-CM | POA: Diagnosis not present

## 2016-04-03 DIAGNOSIS — J449 Chronic obstructive pulmonary disease, unspecified: Secondary | ICD-10-CM | POA: Diagnosis not present

## 2016-04-03 DIAGNOSIS — I4891 Unspecified atrial fibrillation: Secondary | ICD-10-CM | POA: Diagnosis not present

## 2016-04-27 DIAGNOSIS — Z961 Presence of intraocular lens: Secondary | ICD-10-CM | POA: Diagnosis not present

## 2016-04-27 DIAGNOSIS — H52203 Unspecified astigmatism, bilateral: Secondary | ICD-10-CM | POA: Diagnosis not present

## 2016-05-01 DIAGNOSIS — I4891 Unspecified atrial fibrillation: Secondary | ICD-10-CM | POA: Diagnosis not present

## 2016-05-11 ENCOUNTER — Encounter (HOSPITAL_COMMUNITY): Payer: Self-pay | Admitting: Emergency Medicine

## 2016-05-11 ENCOUNTER — Emergency Department (HOSPITAL_COMMUNITY)
Admission: EM | Admit: 2016-05-11 | Discharge: 2016-05-11 | Disposition: A | Discharge status: Home / Self Care | Payer: Medicare Other | Attending: Emergency Medicine | Admitting: Emergency Medicine

## 2016-05-11 ENCOUNTER — Emergency Department (HOSPITAL_COMMUNITY): Payer: Medicare Other

## 2016-05-11 DIAGNOSIS — Z87891 Personal history of nicotine dependence: Secondary | ICD-10-CM | POA: Diagnosis not present

## 2016-05-11 DIAGNOSIS — Y9301 Activity, walking, marching and hiking: Secondary | ICD-10-CM | POA: Diagnosis not present

## 2016-05-11 DIAGNOSIS — I2510 Atherosclerotic heart disease of native coronary artery without angina pectoris: Secondary | ICD-10-CM | POA: Diagnosis not present

## 2016-05-11 DIAGNOSIS — I10 Essential (primary) hypertension: Secondary | ICD-10-CM | POA: Insufficient documentation

## 2016-05-11 DIAGNOSIS — G309 Alzheimer's disease, unspecified: Secondary | ICD-10-CM | POA: Diagnosis not present

## 2016-05-11 DIAGNOSIS — Z950 Presence of cardiac pacemaker: Secondary | ICD-10-CM | POA: Diagnosis not present

## 2016-05-11 DIAGNOSIS — S0990XA Unspecified injury of head, initial encounter: Secondary | ICD-10-CM | POA: Diagnosis not present

## 2016-05-11 DIAGNOSIS — W01198A Fall on same level from slipping, tripping and stumbling with subsequent striking against other object, initial encounter: Secondary | ICD-10-CM | POA: Diagnosis not present

## 2016-05-11 DIAGNOSIS — Y92009 Unspecified place in unspecified non-institutional (private) residence as the place of occurrence of the external cause: Secondary | ICD-10-CM | POA: Diagnosis not present

## 2016-05-11 DIAGNOSIS — S0003XA Contusion of scalp, initial encounter: Secondary | ICD-10-CM | POA: Diagnosis not present

## 2016-05-11 DIAGNOSIS — Y999 Unspecified external cause status: Secondary | ICD-10-CM | POA: Insufficient documentation

## 2016-05-11 DIAGNOSIS — Z7901 Long term (current) use of anticoagulants: Secondary | ICD-10-CM | POA: Insufficient documentation

## 2016-05-11 LAB — CBC
HEMATOCRIT: 42.2 % (ref 39.0–52.0)
Hemoglobin: 13.6 g/dL (ref 13.0–17.0)
MCH: 31.4 pg (ref 26.0–34.0)
MCHC: 32.2 g/dL (ref 30.0–36.0)
MCV: 97.5 fL (ref 78.0–100.0)
Platelets: 215 10*3/uL (ref 150–400)
RBC: 4.33 MIL/uL (ref 4.22–5.81)
RDW: 13.9 % (ref 11.5–15.5)
WBC: 6.1 10*3/uL (ref 4.0–10.5)

## 2016-05-11 LAB — COMPREHENSIVE METABOLIC PANEL
ALT: 16 U/L — AB (ref 17–63)
AST: 31 U/L (ref 15–41)
Albumin: 3.5 g/dL (ref 3.5–5.0)
Alkaline Phosphatase: 105 U/L (ref 38–126)
Anion gap: 9 (ref 5–15)
BUN: 16 mg/dL (ref 6–20)
CHLORIDE: 107 mmol/L (ref 101–111)
CO2: 25 mmol/L (ref 22–32)
CREATININE: 0.89 mg/dL (ref 0.61–1.24)
Calcium: 9.3 mg/dL (ref 8.9–10.3)
Glucose, Bld: 88 mg/dL (ref 65–99)
POTASSIUM: 3.9 mmol/L (ref 3.5–5.1)
SODIUM: 141 mmol/L (ref 135–145)
Total Bilirubin: 0.5 mg/dL (ref 0.3–1.2)
Total Protein: 7.6 g/dL (ref 6.5–8.1)

## 2016-05-11 LAB — PROTIME-INR
INR: 2.06
Prothrombin Time: 23.5 seconds — ABNORMAL HIGH (ref 11.4–15.2)

## 2016-05-11 LAB — CBG MONITORING, ED: Glucose-Capillary: 86 mg/dL (ref 65–99)

## 2016-05-11 NOTE — ED Triage Notes (Signed)
Pt states he fell about 30 minutes ago pt landed on left arm and left side of head onto [place]. Pt denies LOC. Pt is on blood thinners. Golf ball sized lump on left side of head. Pt denies any pain.

## 2016-05-11 NOTE — ED Notes (Signed)
Pt given a urinal an assisted in toileting

## 2016-05-11 NOTE — Discharge Instructions (Signed)
Return to ER with any worsening or changes at home.

## 2016-05-11 NOTE — ED Notes (Signed)
Dr. James at bedside  

## 2016-05-11 NOTE — ED Provider Notes (Signed)
[facility] DEPT Provider Note   CSN: [id number] Arrival date & time: 05/11/16  1414     History   Chief Complaint Chief Complaint  Patient presents with  . Fall    HPI [name] [name] is a 82 y.o. male.  He presents after a fall at home. Had a fall at home walking through his house. Is uncertain why he went down. Did not syncope or lose consciousness. Struck his head. He takes Coumadin. He has an area on his left temple that is painful. He is mentating at his baseline per his wife who accompanies him here.  HPI  Past Medical History:  Diagnosis Date  . CAD (coronary artery disease)   . Dementia   . High cholesterol   . HTN (hypertension)   . Memory loss   . Mild dementia     Patient Active Problem List   Diagnosis Date Noted  . CHB (complete heart block) ([place]) 06/07/2015  . Alzheimer's disease 01/26/2015  . Dementia with behavioral disturbance 07/28/2013  . Pacemaker 01/26/2013  . Atrial fibrillation with slow ventricular response ([place]) 01/26/2013  . Second degree AV block, Mobitz type II 01/26/2013  . CAD (coronary artery disease) 01/26/2013  . Dyspnea 01/26/2013    Past Surgical History:  Procedure Laterality Date  . angiolplasty    . APPENDECTOMY    . INSERT / REPLACE / [place]    . PILONIDAL CYST EXCISION    . rotator cuff surgery  1985  . TONSILLECTOMY    . triple heart bypass         Home Medications    Prior to Admission medications   Medication Sig Start Date End Date Taking? Authorizing Provider  atorvastatin (LIPITOR) 40 MG tablet Take 40 mg by mouth every evening.    Yes Historical Provider, MD  Cholecalciferol (VITAMIN D) 2000 UNITS CAPS Take 1 capsule by mouth daily.   Yes Historical Provider, MD  donepezil (ARICEPT) 5 MG tablet Take 2 tablets (10 mg total) by mouth as directed. Take 10 mg every day, devided into 5 mg in  morning and 5 mg each evening. Patient taking differently: Take 5 mg by mouth 2 (two)  times daily.  07/28/13  Yes Carmen Dohmeier, MD  finasteride (PROSCAR) 5 MG tablet Take 5 mg by mouth every evening.    Yes Historical Provider, MD  Multiple Vitamin (MULTIVITAMIN) tablet Take 1 tablet by mouth daily.   Yes Historical Provider, MD  ranitidine (ZANTAC) 75 MG tablet Take 37.5 mg by mouth 2 (two) times daily.    Yes Historical Provider, MD  warfarin (COUMADIN) 4 MG tablet Take 4 mg by mouth every evening.    Yes Historical Provider, MD  warfarin (COUMADIN) 5 MG tablet Take 5 mg by mouth daily. 04/09/16   Historical Provider, MD    Family History Family History  Problem Relation Age of Onset  . Stroke Father   . Cervical cancer      siblings  . COPD Sister   . Dementia Brother     Social History Social History  Substance Use Topics  . Smoking status: Former Smoker    Quit date: 07/02/1974  . Smokeless tobacco: Never Used  . Alcohol use Yes     Comment: 2-3 "stiff" drinks per day     Allergies   Amiodarone and Antihistamines, diphenhydramine-type   Review of Systems Review of Systems  Constitutional: Negative for appetite change, chills, diaphoresis, fatigue and fever.  HENT: Negative for mouth sores, sore throat and trouble swallowing.        Left temporal contusion  Eyes: Negative for visual disturbance.  Respiratory: Negative for cough, chest tightness, shortness of breath and wheezing.   Cardiovascular: Negative for chest pain.  Gastrointestinal: Negative for abdominal distention, abdominal pain, diarrhea, nausea and vomiting.  Endocrine: Negative for polydipsia, polyphagia and polyuria.  Genitourinary: Negative for dysuria, frequency and hematuria.  Musculoskeletal: Negative for gait problem.  Skin: Negative for color change, pallor and rash.  Neurological: Negative for dizziness, syncope, light-headedness and headaches.  Hematological: Bruises/bleeds easily.  Psychiatric/Behavioral: Negative for behavioral problems and confusion.     Physical  Exam Updated Vital Signs BP 117/61   Pulse 75   Temp 97.5 F (36.4 C) (Oral)   Resp 18   Ht 5\' 9"  (1.753 m)   Wt 160 lb (72.6 kg)   SpO2 98%   BMI 23.63 kg/m   Physical Exam  Constitutional: He is oriented to person, place, and time. He appears well-developed and well-nourished. No distress.  HENT:  Head: Normocephalic.    Small left temporal contusion. No crepitus. No blood over the TMs or mastoids. No blood from ears nose or mouth.  Eyes: Conjunctivae are normal. Pupils are equal, round, and reactive to light. No scleral icterus.  Neck: Normal range of motion. Neck supple. No thyromegaly present.  Cardiovascular: Normal rate and regular rhythm.  Exam reveals no gallop and no friction rub.   No murmur heard. Pulmonary/Chest: Effort normal and breath sounds normal. No respiratory distress. He has no wheezes. He has no rales.  Abdominal: Soft. Bowel sounds are normal. He exhibits no distension. There is no tenderness. There is no rebound.  Musculoskeletal: Normal range of motion.  Neurological: He is alert and oriented to person, place, and time.  Skin: Skin is warm and dry. No rash noted.  Psychiatric: He has a normal mood and affect. His behavior is normal.     ED Treatments / Results  Labs (all labs ordered are listed, but only abnormal results are displayed) Labs Reviewed  COMPREHENSIVE METABOLIC PANEL - Abnormal; Notable for the following:       Result Value   ALT 16 (*)    All other components within normal limits  PROTIME-INR - Abnormal; Notable for the following:    Prothrombin Time 23.5 (*)    All other components within normal limits  CBC  CBG MONITORING, ED    EKG  EKG Interpretation None       Radiology Ct Head Wo Contrast  Result Date: 05/11/2016 CLINICAL DATA:  Fall with head injury. On blood thinners. Initial encounter. EXAM: CT HEAD WITHOUT CONTRAST TECHNIQUE: Contiguous axial images were obtained from the base of the skull through the vertex  without intravenous contrast. COMPARISON:  06/02/2004 FINDINGS: Brain: No evidence of acute infarction, hemorrhage, hydrocephalus, extra-axial collection or mass lesion/mass effect. Generalized cortical atrophy, moderate. Chronic small-vessel disease with confluent ischemic gliosis around the lateral ventricles. Vascular: Atherosclerotic calcification.  No hyperdense vessel. Skull: Negative for fracture Sinuses/Orbits: Bilateral cataract resection. No posttraumatic finding. IMPRESSION: No evidence of intracranial injury. Electronically Signed   By: Monte Fantasia M.D.   On: 05/11/2016 15:15    Procedures Procedures (including critical care time)  Medications Ordered in ED Medications - No data to display   Initial Impression / Assessment and Plan / ED Course  I have reviewed the triage vital signs and the nursing notes.  Pertinent labs & imaging results that  were available during my care of the patient were reviewed by me and considered in my medical decision making (see chart for details).  Clinical Course    Normal CT. No hemorrhage. No fracture. Normal platelet count. INR 2.06. His report for discharge home. He is not orthostatic here. Standing blood pressure is 123.   Final Clinical Impressions(s) / ED Diagnoses   Final diagnoses:  Minor head injury, initial encounter  Contusion of scalp, initial encounter    New Prescriptions New Prescriptions   No medications on file     Tanna Furry, MD 05/11/16 1605

## 2016-05-15 DIAGNOSIS — I4891 Unspecified atrial fibrillation: Secondary | ICD-10-CM | POA: Diagnosis not present

## 2016-06-06 DIAGNOSIS — I4891 Unspecified atrial fibrillation: Secondary | ICD-10-CM | POA: Diagnosis not present

## 2016-07-05 DIAGNOSIS — I4891 Unspecified atrial fibrillation: Secondary | ICD-10-CM | POA: Diagnosis not present

## 2016-07-09 ENCOUNTER — Encounter: Payer: Self-pay | Admitting: Neurology

## 2016-07-09 ENCOUNTER — Ambulatory Visit (INDEPENDENT_AMBULATORY_CARE_PROVIDER_SITE_OTHER): Payer: Medicare Other | Admitting: Neurology

## 2016-07-09 DIAGNOSIS — F0390 Unspecified dementia without behavioral disturbance: Secondary | ICD-10-CM | POA: Diagnosis not present

## 2016-07-09 MED ORDER — DONEPEZIL HCL 5 MG PO TABS: 5.0000 mg | ORAL_TABLET | Freq: Two times a day (BID) | ORAL | 3 refills | Status: DC

## 2016-07-09 NOTE — Progress Notes (Signed)
[facility]  PATIENT: [name] [name] DOB: 12/29/1925   REASON FOR VISIT: follow-up for memory loss/dementia HISTORY FROM:patient and wife    HISTORY OF PRESENT ILLNESS:  I have pleasure of seeing [name] [name] today, an 83 year old Caucasian right-handed gentleman who has been followed for memory evaluations on a six-month interval basis. Today 07/09/2016 and is Mini-Mental Status Examination revealed 20 out of 30 points today the last evaluation was 25 out of 30. The patient has been stable since summer 2016 with points between 25 and 24 out of 30. [name] [name], 83 year old male returns for follow-up. He has history of Alzheimer's dementia. He continues to be able to complete his ADLs independently, he does not have a driver's license. He is currently on Aricept 5 mg twice a day for GI  Reasons.  His memory score is stable on Aricept alone. He gets his medications from the [place]. He does not exercise his memory with puzzles or crosswords etc. He gets no regular exercise.   Had one recent fall, no injuries resulted. His wife is here confirming his memory is poorer than last year- just last week they went to the cinema together and watched a movie about Laqueta Linden, as a left the cinema [name] [name] asked what the movie was about he had forgotten. He could also not recall the movie when his wife explained what they had just watched- he believes he was asleep- his wife denied this.  His gait has deteriorated.  Mr [name] grew up in [place] and in [place], [place]. He has a lot of interesting stories about his education at [employer]. in [place] in the 1940's.    MMSE - Mini Mental State Exam 07/09/2016 01/04/2016 07/05/2015  Orientation to time 1 2 3   Orientation to Place 4 3 3   Registration 3 3 3   Attention/ Calculation 4 4 4   Recall 0 1 1  Language- name 2 objects 2 2 2   Language- repeat 1 1 1   Language- follow 3 step command 3 3 3   Language- read & follow direction 1 1 1   Write a sentence  1 1 1   Copy design 0 1 1  Total score 20 22 23      HISTORY: [name] [name] is a 83 year old male with a history of dementia. He returns today for follow-up. He is currently taking Aricept 10 mg and tolerating it well. The patient states that his memory is "good." his wife reports that she feels that it has remained the same. The patient is able to complete all ADLs independently.  The patient has a driver's licenses but no longer drives. His wife does all the driving.  He denies having to give up anything due to his memory. The wife states that the patient's behavior has not been an issue.  On 7-20 7-16 the patient scored 25 out of 30 points and the Mini-Mental Status Examination for the last 12 months he has been very stable on Aricept alone. [name], his primary care physician, asked if Namenda could be added and indeed it could be. I am often combined the 2 medications Aricept and Namenda and they can be taken in the generic form or even in a combination product as one pill called Namziric. I will start [name] [name] on a starter pack for once a day namenda.  He is interested in stopping protonix, and he may get by with Tumms at night. A trial of 4-6 weeks would suffice to show if he is still having  reflux.    REVIEW OF SYSTEMS: Full 14 system review of systems performed and notable only for those listed, all others are neg:  Genitourinary kidney stone in the left kidney. Bradycardia-  That's we we don't increase Aricept.  Hematology/Lymphatic: easy bruising Neurological: dementia . Long term memory intact, short term memory much decreased.   ALLERGIES: Allergies  Allergen Reactions  . Amiodarone Other (See Comments)    Had a severe lung infection in 2003  . Antihistamines, Diphenhydramine-Type Other (See Comments)    Jittery    HOME MEDICATIONS: Outpatient Medications Prior to Visit  Medication Sig Dispense Refill  . atorvastatin (LIPITOR) 40 MG tablet Take 40 mg by mouth every  evening.     . Cholecalciferol (VITAMIN D) 2000 UNITS CAPS Take 1 capsule by mouth daily.    [name] donepezil (ARICEPT) 5 MG tablet Take 2 tablets (10 mg total) by mouth as directed. Take 10 mg every day, devided into 5 mg in  morning and 5 mg each evening. (Patient taking differently: Take 5 mg by mouth 2 (two) times daily. ) 180 tablet 3  . finasteride (PROSCAR) 5 MG tablet Take 5 mg by mouth every evening.     . Multiple Vitamin (MULTIVITAMIN) tablet Take 1 tablet by mouth daily.    . ranitidine (ZANTAC) 75 MG tablet Take 37.5 mg by mouth 2 (two) times daily.     [name] warfarin (COUMADIN) 4 MG tablet Take 4 mg by mouth every evening.     . warfarin (COUMADIN) 5 MG tablet Take 5 mg by mouth daily.  5   No facility-administered medications prior to visit.     PAST MEDICAL HISTORY: Past Medical History:  Diagnosis Date  . CAD (coronary artery disease)   . Dementia   . High cholesterol   . HTN (hypertension)   . Memory loss   . Mild dementia     PAST SURGICAL HISTORY: Past Surgical History:  Procedure Laterality Date  . angiolplasty    . APPENDECTOMY    . INSERT / REPLACE / [place]    . PILONIDAL CYST EXCISION    . rotator cuff surgery  1985  . TONSILLECTOMY    . triple heart bypass      FAMILY HISTORY: Family History  Problem Relation Age of Onset  . Stroke Father   . Cervical cancer      siblings  . COPD Sister   . Dementia Brother     SOCIAL HISTORY: Social History   Social History  . Marital status: Married    Spouse name: [name]  . Number of children: 6  . Years of education: college   Occupational History  . Not on file.   Social History Main Topics  . Smoking status: Former Smoker    Quit date: 07/02/1974  . Smokeless tobacco: Never Used  . Alcohol use Yes     Comment: 2-3 "stiff" drinks per day  . Drug use: No  . Sexual activity: Not on file   Other Topics Concern  . Not on file   Social History Narrative   Patient is  married [name]) and lives at home with his wife.   Patient has a college education.   Patient is a retired Materials engineer.   Patient does not drink any caffeine.   Patient has six children.     PHYSICAL EXAM  Vitals:   07/09/16 1047  BP: 117/70  Pulse: 77  Resp:  20  Weight: 165 lb (74.8 kg)  Height: 5\' 10"  (1.778 m)   Body mass index is 23.68 kg/m. Generalized: Well developed, in no acute distress   Neurological examination  Mentation: Alert oriented to time, place, history taking. Follows all commands speech and language fluent.  mini-mental status examination- see above. MMSE 20/30. AFT 3. Clock drawing 4/4. Last MMSE 23/30. Cranial nerve , Pupils were equal round reactive to light. Extraocular movements were full, visual field were full on confrontational test. Facial sensation and strength were normal. Uvula tongue midline. Head turning and shoulder shrug were restricted by his scoliosis Motor:  5 / 5 strength in upper extremities, good grip strength.  Sensory: Sensory testing is intact to soft touch on all 4 extremities. No evidence of extinction is noted.  Coordination: Cerebellar testing reveals good finger-nose-finger and heel-to-shin bilaterally.  Gait and station: Posture is stooped ,Gait is normal. Tandem gait is unsteady.No assistive device  Reflexes: Deep tendon reflexes are symmetric and normal bilaterally. Babinski down going  DIAGNOSTIC DATA (LABS, IMAGING, TESTING)  ASSESSMENT AND PLAN 83 y.o. year old male  has a past medical history of HTN (hypertension); CAD (coronary artery disease); High cholesterol;  Memory loss; and Dementia. here to follow-up . His memory score is stable. The patient is a current patient of  Cecille Rubin NP and myself, Dr. Brett Fairy  Continue Aricept at current dose meds from [place].  Follow up in 6 -12  months  with Dr. [facility] [address] [place], [place]  [id number] [phone]

## 2016-07-31 DIAGNOSIS — Z0000 Encounter for general adult medical examination without abnormal findings: Secondary | ICD-10-CM | POA: Diagnosis not present

## 2016-07-31 DIAGNOSIS — Z1389 Encounter for screening for other disorder: Secondary | ICD-10-CM | POA: Diagnosis not present

## 2016-07-31 DIAGNOSIS — L821 Other seborrheic keratosis: Secondary | ICD-10-CM | POA: Diagnosis not present

## 2016-07-31 DIAGNOSIS — Z79899 Other long term (current) drug therapy: Secondary | ICD-10-CM | POA: Diagnosis not present

## 2016-07-31 DIAGNOSIS — I481 Persistent atrial fibrillation: Secondary | ICD-10-CM | POA: Diagnosis not present

## 2016-07-31 DIAGNOSIS — I700 Atherosclerosis of aorta: Secondary | ICD-10-CM | POA: Diagnosis not present

## 2016-07-31 DIAGNOSIS — G301 Alzheimer's disease with late onset: Secondary | ICD-10-CM | POA: Diagnosis not present

## 2016-07-31 DIAGNOSIS — E7800 Pure hypercholesterolemia, unspecified: Secondary | ICD-10-CM | POA: Diagnosis not present

## 2016-07-31 DIAGNOSIS — I10 Essential (primary) hypertension: Secondary | ICD-10-CM | POA: Diagnosis not present

## 2016-07-31 DIAGNOSIS — R109 Unspecified abdominal pain: Secondary | ICD-10-CM | POA: Diagnosis not present

## 2016-08-01 ENCOUNTER — Encounter: Payer: Self-pay | Admitting: Cardiovascular Disease

## 2016-08-01 ENCOUNTER — Ambulatory Visit (INDEPENDENT_AMBULATORY_CARE_PROVIDER_SITE_OTHER): Payer: Medicare Other | Admitting: Cardiovascular Disease

## 2016-08-01 VITALS — BP 120/66 | HR 70 | Ht 69.0 in | Wt 163.0 lb

## 2016-08-01 DIAGNOSIS — I4891 Unspecified atrial fibrillation: Secondary | ICD-10-CM

## 2016-08-01 DIAGNOSIS — Z950 Presence of cardiac pacemaker: Secondary | ICD-10-CM

## 2016-08-01 DIAGNOSIS — Z7901 Long term (current) use of anticoagulants: Secondary | ICD-10-CM

## 2016-08-01 DIAGNOSIS — I441 Atrioventricular block, second degree: Secondary | ICD-10-CM | POA: Diagnosis not present

## 2016-08-01 DIAGNOSIS — I25118 Atherosclerotic heart disease of native coronary artery with other forms of angina pectoris: Secondary | ICD-10-CM | POA: Diagnosis not present

## 2016-08-01 DIAGNOSIS — I209 Angina pectoris, unspecified: Secondary | ICD-10-CM | POA: Diagnosis not present

## 2016-08-01 NOTE — Patient Instructions (Signed)
Your physician recommends that you schedule an appointment in 6 months in the device clinic at [facility].  Dr Croitoru recommends that you schedule a follow-up appointment in 12 months with a pacemaker. You will receive a reminder letter in the mail two months in advance. If you don't receive a letter, please call our office to schedule the follow-up appointment.  If you need a refill on your cardiac medications before your next appointment, please call your pharmacy.

## 2016-08-01 NOTE — Progress Notes (Signed)
Cardiology Office Note    Date:  08/02/2016   ID:  [name] [name], DOB August 27, 1925, MRN [id number]  PCP:  Mathews Argyle, MD  Cardiologist:   [name], MD   Chief Complaint  Patient presents with  . Follow-up    History of Present Illness:  [name] [name] is a 83 y.o. male with complete heart block (pacemaker dependent), permanent atrial fibrillation, CAD s/p CABG, Alzheimer's dementia, small ascending thoracic aortic aneurysm (4 cm), hypertension and hyperlipidemia here for follow-up on his pacemaker (2009, [employer] Zephyr XL DR, programmed VVIR).  He feels well and does not have any complaints of dyspnea, angina, dizziness, palpitations, leg edema, claudication or new focal neurological complaints. His memory is slowly deteriorating but he is still quite pleasant and conversant. He is very intelligent and educated and is able to compensate for his dementia well, but he asked me the same question 4 times in 10 minutes today. His wife is an excellent caregiver. He saw Dr. Brett Fairy recently and had a minimal decline on his Mini-Mental Status exam from 23/30 to 20/30.  He had one fall last November when he stepped off a curb. He did have head impact but his evaluation in the emergency room, including a CT head, showed benign findings  Interrogation of his pacemaker shows normal device function. The device is programmed VVIR for permanent atrial fibrillation and he has >99% ventricular pacing despite the fact that he is not on any rate control medications. The estimated battery longevity is 5-6 years. Today, he has an underlying slow rhythm between 50-60s. No episodes of high ventricular rate are recorded.  He had bypass surgery in 2010 [name], LIMA to LAD, SVG to distal circumflex, SVG to distal RCA) and has not had any signs or symptoms of coronary disease since that time.  He had a surgical maze ablation and ligation of left atrial appendage at that time. His nuclear stress test  in August 2014 showed a dense inferolateral scar. Left ventricular ejection fraction was around 50%.   Past Medical History:  Diagnosis Date  . CAD (coronary artery disease)   . Dementia   . High cholesterol   . HTN (hypertension)   . Memory loss   . Mild dementia     Past Surgical History:  Procedure Laterality Date  . angiolplasty    . APPENDECTOMY    . INSERT / REPLACE / [place]    . PILONIDAL CYST EXCISION    . rotator cuff surgery  1985  . TONSILLECTOMY    . triple heart bypass      Current Medications: Outpatient Medications Prior to Visit  Medication Sig Dispense Refill  . atorvastatin (LIPITOR) 40 MG tablet Take 40 mg by mouth every evening.     . Cholecalciferol (VITAMIN D) 2000 UNITS CAPS Take 1 capsule by mouth daily.    [name] donepezil (ARICEPT) 5 MG tablet Take 1 tablet (5 mg total) by mouth 2 (two) times daily. 180 tablet 3  . finasteride (PROSCAR) 5 MG tablet Take 5 mg by mouth every evening.     . Multiple Vitamin (MULTIVITAMIN) tablet Take 1 tablet by mouth daily.    [name] warfarin (COUMADIN) 4 MG tablet Take 4 mg by mouth every evening.     . warfarin (COUMADIN) 5 MG tablet Take 5 mg by mouth daily.  5  . ranitidine (ZANTAC) 75 MG tablet Take 37.5 mg by mouth 2 (two) times daily.  No facility-administered medications prior to visit.      Allergies:   Amiodarone and Antihistamines, diphenhydramine-type   Social History   Social History  . Marital status: Married    Spouse name: [name]  . Number of children: 6  . Years of education: college   Social History Main Topics  . Smoking status: Former Smoker    Quit date: 07/02/1974  . Smokeless tobacco: Never Used  . Alcohol use Yes     Comment: 2-3 "stiff" drinks per day  . Drug use: No  . Sexual activity: Not Asked   Other Topics Concern  . None   Social History Narrative   Patient is married Artist) and lives at home with his wife.   Patient has a college  education.   Patient is a retired Materials engineer.   Patient does not drink any caffeine.   Patient has six children.     Family History:  The patient's family history includes COPD in his sister; Dementia in his brother; Stroke in his father.   ROS:   Please see the history of present illness.    ROS All other systems reviewed and are negative.   PHYSICAL EXAM:   VS:  BP 120/66   Pulse 70   Ht 5\' 9"  (1.753 m)   Wt 73.9 kg (163 lb)   BMI 24.07 kg/m    GEN: Well nourished, well developed, in no acute distress  HEENT: normal  Neck: no JVD, carotid bruits, or masses Cardiac: Paradoxically split second heart sound, RRR; no murmurs, rubs, or gallops,no edema, healthy subclavian pacemaker site  Respiratory:  clear to auscultation bilaterally, normal work of breathing GI: soft, nontender, nondistended, + BS MS: no deformity or atrophy  Skin: warm and dry, no rash Neuro:  Alert and Oriented x 3, Strength and sensation are intact Psych: euthymic mood, full affect  Wt Readings from Last 3 Encounters:  08/01/16 73.9 kg (163 lb)  07/09/16 74.8 kg (165 lb)  05/11/16 72.6 kg (160 lb)      Studies/Labs Reviewed:   EKG:  EKG is ordered today.  The ekg ordered today demonstrates Atrial fibrillation and ventricular paced rhythm, QTC 486 ms  LABS performed by [name] in December showed normal liver function tests and normal TSH   ASSESSMENT:    1. Second degree AV block, Mobitz type II   2. Atrial fibrillation with slow ventricular response ([place])   3. Long term current use of anticoagulant   4. Coronary artery disease involving native coronary artery of native heart with other form of angina pectoris ([place])   5. Pacemaker      PLAN:  In order of problems listed above:  1. AV block: He is not technically pacemaker dependent, but has a very high frequency of ventricular pacing. For safety should be considered device dependent. His pacemaker is a dual-chamber device  to was initially implanted for high-grade second-degree AV block in 2009, now programmed VVIR for his chronic atrial fibrillation. We can try to reduce ventricular pacing to some degree. I decreased his lower rate limit is 60 bpm an added hysteresis down to 50 bpm. 2. AFib: Well rate controlled without medication. On warfarin anticoagulation. Note that he does have history of surgical clipping of the left atrial appendage. If bleeding becomes an issue or concern, he should be at a lower than average risk for embolic events. 3. Warfarin:  without bleeding complications. He has had one recent fall. Generally he appears  to be pretty steady on his feet. 4. CAD s/p CABG: Currently asymptomatic. I don't have his recent lipid profile, but he is taking a relatively high dose of statin. He is not on aspirin due to anticoagulation therapy. He has a known inferolateral scar and borderline LV ejection fraction of 50%. He has never had clinical manifestations of heart failure. 5. PM: Normal device function. Unfortunately it is not amenable to remote downloads. Will perform in office checks every 6 months    Medication Adjustments/Labs and Tests Ordered: Current medicines are reviewed at length with the patient today.  Concerns regarding medicines are outlined above.  Medication changes, Labs and Tests ordered today are listed in the Patient Instructions below. Patient Instructions  Your physician recommends that you schedule an appointment in 6 months in the device clinic at [facility].  Dr Gearld Kerstein recommends that you schedule a follow-up appointment in 12 months with a pacemaker. You will receive a reminder letter in the mail two months in advance. If you don't receive a letter, please call our office to schedule the follow-up appointment.  If you need a refill on your cardiac medications before your next appointment, please call your pharmacy.    Signed, [name], MD  08/02/2016 2:21 PM    [address], [place], [place]  [address] Phone: [phone]; Fax: [phone]

## 2016-08-02 DIAGNOSIS — Z7901 Long term (current) use of anticoagulants: Secondary | ICD-10-CM | POA: Insufficient documentation

## 2016-08-06 ENCOUNTER — Encounter: Payer: Medicare Other | Admitting: Cardiovascular Disease

## 2016-08-07 DIAGNOSIS — I10 Essential (primary) hypertension: Secondary | ICD-10-CM | POA: Diagnosis not present

## 2016-08-07 DIAGNOSIS — Z7901 Long term (current) use of anticoagulants: Secondary | ICD-10-CM | POA: Diagnosis not present

## 2016-08-13 LAB — CUP PACEART INCLINIC DEVICE CHECK
Date Time Interrogation Session: 20180212100959
Implantable Lead Implant Date: 20010830
Implantable Lead Location: 753859
MDC IDC LEAD IMPLANT DT: 20010830
MDC IDC LEAD LOCATION: 753860
MDC IDC PG IMPLANT DT: 20090722
Pulse Gen Model: 5826
Pulse Gen Serial Number: 1216574

## 2016-08-21 DIAGNOSIS — Z7901 Long term (current) use of anticoagulants: Secondary | ICD-10-CM | POA: Diagnosis not present

## 2016-08-21 DIAGNOSIS — I10 Essential (primary) hypertension: Secondary | ICD-10-CM | POA: Diagnosis not present

## 2016-08-21 DIAGNOSIS — I481 Persistent atrial fibrillation: Secondary | ICD-10-CM | POA: Diagnosis not present

## 2016-09-18 DIAGNOSIS — I481 Persistent atrial fibrillation: Secondary | ICD-10-CM | POA: Diagnosis not present

## 2016-10-16 DIAGNOSIS — I481 Persistent atrial fibrillation: Secondary | ICD-10-CM | POA: Diagnosis not present

## 2016-11-12 DIAGNOSIS — L821 Other seborrheic keratosis: Secondary | ICD-10-CM | POA: Diagnosis not present

## 2016-11-13 DIAGNOSIS — I481 Persistent atrial fibrillation: Secondary | ICD-10-CM | POA: Diagnosis not present

## 2016-12-11 DIAGNOSIS — I481 Persistent atrial fibrillation: Secondary | ICD-10-CM | POA: Diagnosis not present

## 2017-01-09 DIAGNOSIS — I481 Persistent atrial fibrillation: Secondary | ICD-10-CM | POA: Diagnosis not present

## 2017-01-23 ENCOUNTER — Other Ambulatory Visit: Payer: Self-pay | Admitting: Geriatric Medicine

## 2017-01-23 DIAGNOSIS — I10 Essential (primary) hypertension: Secondary | ICD-10-CM | POA: Diagnosis not present

## 2017-01-23 DIAGNOSIS — I716 Thoracoabdominal aortic aneurysm, without rupture, unspecified: Secondary | ICD-10-CM

## 2017-01-23 DIAGNOSIS — I481 Persistent atrial fibrillation: Secondary | ICD-10-CM | POA: Diagnosis not present

## 2017-01-23 DIAGNOSIS — G301 Alzheimer's disease with late onset: Secondary | ICD-10-CM | POA: Diagnosis not present

## 2017-01-23 DIAGNOSIS — I700 Atherosclerosis of aorta: Secondary | ICD-10-CM | POA: Diagnosis not present

## 2017-01-29 ENCOUNTER — Ambulatory Visit
Admission: RE | Admit: 2017-01-29 | Discharge: 2017-01-29 | Disposition: A | Discharge status: Home / Self Care | Payer: Medicare Other | Source: Ambulatory Visit | Attending: Geriatric Medicine | Admitting: Geriatric Medicine

## 2017-01-29 DIAGNOSIS — I716 Thoracoabdominal aortic aneurysm, without rupture, unspecified: Secondary | ICD-10-CM

## 2017-01-29 DIAGNOSIS — I712 Thoracic aortic aneurysm, without rupture: Secondary | ICD-10-CM | POA: Diagnosis not present

## 2017-02-06 ENCOUNTER — Ambulatory Visit (INDEPENDENT_AMBULATORY_CARE_PROVIDER_SITE_OTHER): Payer: Medicare Other | Admitting: *Deleted

## 2017-02-06 DIAGNOSIS — I4891 Unspecified atrial fibrillation: Secondary | ICD-10-CM

## 2017-02-06 DIAGNOSIS — Z950 Presence of cardiac pacemaker: Secondary | ICD-10-CM | POA: Diagnosis not present

## 2017-02-06 DIAGNOSIS — I481 Persistent atrial fibrillation: Secondary | ICD-10-CM | POA: Diagnosis not present

## 2017-02-06 LAB — CUP PACEART INCLINIC DEVICE CHECK
Battery Voltage: 2.78 V
Date Time Interrogation Session: 20180808111725
Implantable Lead Implant Date: 20010830
Implantable Lead Implant Date: 20010830
Implantable Lead Location: 753860
Lead Channel Impedance Value: 501 Ohm
Lead Channel Pacing Threshold Amplitude: 0.5 V
Lead Channel Setting Pacing Pulse Width: 0.5 ms
MDC IDC LEAD LOCATION: 753859
MDC IDC MSMT BATTERY IMPEDANCE: 2400 Ohm
MDC IDC MSMT LEADCHNL RV PACING THRESHOLD PULSEWIDTH: 0.5 ms
MDC IDC MSMT LEADCHNL RV SENSING INTR AMPL: 4.5 mV
MDC IDC PG IMPLANT DT: 20090722
MDC IDC SET LEADCHNL RV SENSING SENSITIVITY: 2 mV
MDC IDC STAT BRADY RV PERCENT PACED: 96 %
Pulse Gen Model: 5826
Pulse Gen Serial Number: 1216574

## 2017-02-06 NOTE — Progress Notes (Signed)
Pacemaker check in clinic. Normal device function. Thresholds, sensing, impedances consistent with previous measurements. RV autocapture trend indicates past out of range thresholds, updated autocapture template today (last updated in 2010). Device programmed to maximize longevity. Chronic AF, +warfarin. Device programmed at appropriate safety margins. Histogram distribution appropriate for patient activity level. Device programmed to optimize intrinsic conduction. Estimated longevity 4.75-6 years. Patient education completed. ROV with [place] in 07/2017.

## 2017-02-06 NOTE — Patient Instructions (Signed)
Your physician wants you to follow-up in: January 2019 with Dr. Sallyanne Kuster. You will receive a reminder letter in the mail two months in advance. If you don't receive a letter, please call our office to schedule the follow-up appointment.

## 2017-02-11 DIAGNOSIS — D225 Melanocytic nevi of trunk: Secondary | ICD-10-CM | POA: Diagnosis not present

## 2017-02-11 DIAGNOSIS — L821 Other seborrheic keratosis: Secondary | ICD-10-CM | POA: Diagnosis not present

## 2017-02-11 DIAGNOSIS — D1801 Hemangioma of skin and subcutaneous tissue: Secondary | ICD-10-CM | POA: Diagnosis not present

## 2017-02-11 DIAGNOSIS — L570 Actinic keratosis: Secondary | ICD-10-CM | POA: Diagnosis not present

## 2017-02-13 DIAGNOSIS — I481 Persistent atrial fibrillation: Secondary | ICD-10-CM | POA: Diagnosis not present

## 2017-03-06 DIAGNOSIS — I481 Persistent atrial fibrillation: Secondary | ICD-10-CM | POA: Diagnosis not present

## 2017-03-14 DIAGNOSIS — I481 Persistent atrial fibrillation: Secondary | ICD-10-CM | POA: Diagnosis not present

## 2017-03-14 DIAGNOSIS — I10 Essential (primary) hypertension: Secondary | ICD-10-CM | POA: Diagnosis not present

## 2017-03-14 DIAGNOSIS — N400 Enlarged prostate without lower urinary tract symptoms: Secondary | ICD-10-CM | POA: Diagnosis not present

## 2017-03-14 DIAGNOSIS — G301 Alzheimer's disease with late onset: Secondary | ICD-10-CM | POA: Diagnosis not present

## 2017-03-20 DIAGNOSIS — I481 Persistent atrial fibrillation: Secondary | ICD-10-CM | POA: Diagnosis not present

## 2017-04-03 DIAGNOSIS — Z23 Encounter for immunization: Secondary | ICD-10-CM | POA: Diagnosis not present

## 2017-04-17 DIAGNOSIS — I481 Persistent atrial fibrillation: Secondary | ICD-10-CM | POA: Diagnosis not present

## 2017-05-02 DIAGNOSIS — H52203 Unspecified astigmatism, bilateral: Secondary | ICD-10-CM | POA: Diagnosis not present

## 2017-05-02 DIAGNOSIS — Z961 Presence of intraocular lens: Secondary | ICD-10-CM | POA: Diagnosis not present

## 2017-05-08 ENCOUNTER — Emergency Department (HOSPITAL_COMMUNITY)
Admission: EM | Admit: 2017-05-08 | Discharge: 2017-05-08 | Disposition: A | Discharge status: Home / Self Care | Payer: Medicare Other | Attending: Emergency Medicine | Admitting: Emergency Medicine

## 2017-05-08 ENCOUNTER — Telehealth: Payer: Self-pay | Admitting: Cardiovascular Disease

## 2017-05-08 ENCOUNTER — Emergency Department (HOSPITAL_COMMUNITY): Payer: Medicare Other

## 2017-05-08 ENCOUNTER — Encounter (HOSPITAL_COMMUNITY): Payer: Self-pay | Admitting: Emergency Medicine

## 2017-05-08 DIAGNOSIS — Z950 Presence of cardiac pacemaker: Secondary | ICD-10-CM | POA: Diagnosis not present

## 2017-05-08 DIAGNOSIS — G309 Alzheimer's disease, unspecified: Secondary | ICD-10-CM | POA: Insufficient documentation

## 2017-05-08 DIAGNOSIS — I2510 Atherosclerotic heart disease of native coronary artery without angina pectoris: Secondary | ICD-10-CM | POA: Insufficient documentation

## 2017-05-08 DIAGNOSIS — I499 Cardiac arrhythmia, unspecified: Secondary | ICD-10-CM | POA: Diagnosis not present

## 2017-05-08 DIAGNOSIS — I10 Essential (primary) hypertension: Secondary | ICD-10-CM | POA: Diagnosis not present

## 2017-05-08 DIAGNOSIS — I4821 Permanent atrial fibrillation: Secondary | ICD-10-CM

## 2017-05-08 DIAGNOSIS — Z7901 Long term (current) use of anticoagulants: Secondary | ICD-10-CM | POA: Insufficient documentation

## 2017-05-08 DIAGNOSIS — R002 Palpitations: Secondary | ICD-10-CM | POA: Diagnosis present

## 2017-05-08 DIAGNOSIS — I482 Chronic atrial fibrillation: Secondary | ICD-10-CM | POA: Diagnosis not present

## 2017-05-08 DIAGNOSIS — Z87891 Personal history of nicotine dependence: Secondary | ICD-10-CM | POA: Insufficient documentation

## 2017-05-08 DIAGNOSIS — Z79899 Other long term (current) drug therapy: Secondary | ICD-10-CM | POA: Insufficient documentation

## 2017-05-08 LAB — CBC
HCT: 38.1 % — ABNORMAL LOW (ref 39.0–52.0)
HEMOGLOBIN: 12.4 g/dL — AB (ref 13.0–17.0)
MCH: 31.9 pg (ref 26.0–34.0)
MCHC: 32.5 g/dL (ref 30.0–36.0)
MCV: 97.9 fL (ref 78.0–100.0)
Platelets: 212 10*3/uL (ref 150–400)
RBC: 3.89 MIL/uL — AB (ref 4.22–5.81)
RDW: 14.4 % (ref 11.5–15.5)
WBC: 6.7 10*3/uL (ref 4.0–10.5)

## 2017-05-08 LAB — BASIC METABOLIC PANEL
ANION GAP: 8 (ref 5–15)
BUN: 13 mg/dL (ref 6–20)
CALCIUM: 9 mg/dL (ref 8.9–10.3)
CHLORIDE: 100 mmol/L — AB (ref 101–111)
CO2: 29 mmol/L (ref 22–32)
Creatinine, Ser: 1.03 mg/dL (ref 0.61–1.24)
GFR calc non Af Amer: >60 mL/min (ref >60)
Glucose, Bld: 92 mg/dL (ref 65–99)
Potassium: 4.2 mmol/L (ref 3.5–5.1)
SODIUM: 137 mmol/L (ref 135–145)

## 2017-05-08 LAB — PROTIME-INR
INR: 1.8
PROTHROMBIN TIME: 20.7 s — AB (ref 11.4–15.2)

## 2017-05-08 NOTE — ED Provider Notes (Signed)
[facility] DEPT Provider Note  CSN: [id number] Arrival date & time: 05/08/17 1100  Chief Complaint(s) irregular heartbeat  HPI [name] [name] is a 83 y.o. male with a history of Alzheimer's and permanent atrial fibrillation with dual-chamber pacer presents to the emergency department for palpitations.  Wife reported that the patient complained of fluttering and palpitations earlier today.  These were brief and short-lived.  However they discuss this with cardiology who recommended they present to the emergency department for evaluation.  They denied any chest pain or shortness of breath.  No recent fevers or infections.  No nausea, vomiting, diarrhea.  No other alleviating or aggravating factors.  No other physical complaints.  HPI  Past Medical History Past Medical History:  Diagnosis Date  . CAD (coronary artery disease)   . Dementia   . High cholesterol   . HTN (hypertension)   . Memory loss   . Mild dementia    Patient Active Problem List   Diagnosis Date Noted  . Long term current use of anticoagulant 08/02/2016  . CHB (complete heart block) ([place]) 06/07/2015  . Alzheimer's disease 01/26/2015  . Dementia with behavioral disturbance 07/28/2013  . Pacemaker 01/26/2013  . Atrial fibrillation with slow ventricular response ([place]) 01/26/2013  . Second degree AV block, Mobitz type II 01/26/2013  . CAD (coronary artery disease) 01/26/2013  . Dyspnea 01/26/2013   Home Medication(s) Prior to Admission medications   Medication Sig Start Date End Date Taking? Authorizing Provider  atorvastatin (LIPITOR) 40 MG tablet Take 40 mg by mouth every evening.     [provider]  Cholecalciferol (VITAMIN D) 2000 UNITS CAPS Take 1 capsule by mouth daily.    [provider]  donepezil (ARICEPT) 5 MG tablet Take 1 tablet (5 mg total) by mouth 2 (two) times daily. 07/09/16   Dohmeier, Asencion Partridge, MD  finasteride (PROSCAR) 5 MG tablet Take 5 mg by mouth  every evening.     [provider]  Multiple Vitamin (MULTIVITAMIN) tablet Take 1 tablet by mouth daily.    [provider]  ranitidine (ZANTAC) 75 MG tablet Take 75 mg by mouth 2 (two) times daily.    [provider]  warfarin (COUMADIN) 4 MG tablet Take 4 mg by mouth every evening.     [provider]                                                                                                                                    Past Surgical History Past Surgical History:  Procedure Laterality Date  . angiolplasty    . APPENDECTOMY    . INSERT / REPLACE / [place]    . PILONIDAL CYST EXCISION    . rotator cuff surgery  1985  . TONSILLECTOMY    . triple heart bypass     Family History Family History  Problem Relation  Age of Onset  . Stroke Father   . Cervical cancer Unknown        siblings  . COPD Sister   . Dementia Brother     Social History Social History   Tobacco Use  . Smoking status: Former Smoker    Last attempt to quit: 07/02/1974    Years since quitting: 42.8  . Smokeless tobacco: Never Used  Substance Use Topics  . Alcohol use: Yes    Comment: 2-3 "stiff" drinks per day  . Drug use: No   Allergies Amiodarone and Antihistamines, diphenhydramine-type  Review of Systems Review of Systems All other systems are reviewed and are negative for acute change except as noted in the HPI  Physical Exam Vital Signs  I have reviewed the triage vital signs BP 118/72 (BP Location: Right Arm)   Pulse 60   Temp 97.8 F (36.6 C) (Oral)   Resp (!) 26   SpO2 95%   Physical Exam  Constitutional: He is oriented to person, place, and time. He appears well-developed and well-nourished. No distress.  HENT:  Head: Normocephalic and atraumatic.  Nose: Nose normal.  Eyes: Conjunctivae and EOM are normal. Pupils are equal, round, and reactive to light. Right eye exhibits no discharge. Left eye exhibits no  discharge. No scleral icterus.  Neck: Normal range of motion. Neck supple.  Cardiovascular: Normal rate and regular rhythm. Exam reveals no gallop and no friction rub.  No murmur heard. Pulmonary/Chest: Effort normal and breath sounds normal. No stridor. No respiratory distress. He has no rales.  Abdominal: Soft. He exhibits no distension. There is no tenderness.  Musculoskeletal: He exhibits no edema or tenderness.  Neurological: He is alert and oriented to person, place, and time.  Skin: Skin is warm and dry. No rash noted. He is not diaphoretic. No erythema.  Psychiatric: He has a normal mood and affect.  Vitals reviewed.   ED Results and Treatments Labs (all labs ordered are listed, but only abnormal results are displayed) Labs Reviewed  BASIC METABOLIC PANEL  CBC  PROTIME-INR  I-STAT TROPONIN, ED                                                                                                                         EKG  EKG Interpretation  Date/Time:    Ventricular Rate:    PR Interval:    QRS Duration:   QT Interval:    QTC Calculation:   R Axis:     Text Interpretation:        Radiology No results found. Pertinent labs & imaging results that were available during my care of the patient were reviewed by me and considered in my medical decision making (see chart for details).  Medications Ordered in ED Medications - No data to display  Procedures Procedures  (including critical care time)  Medical Decision Making / ED Course I have reviewed the nursing notes for this encounter and the patient's prior records (if available in EHR or on provided paperwork).     EKG notable for A. fib at a paced and rate controlled rhythm.  Screening labs grossly reassuring.  INR was mildly subtherapeutic. chest x-ray with leads in appropriate  position.  Interrogation of the pacemaker did not reveal any evidence of tachycardia.  The patient is safe for discharge with strict return precautions.   Final Clinical Impression(s) / ED Diagnoses Final diagnoses:  None    Disposition: Discharge  Condition: Good  I have discussed the results, Dx and Tx plan with the patient and wife who expressed understanding and agree(s) with the plan. Discharge instructions discussed at great length. The patient and wife was given strict return precautions who verbalized understanding of the instructions. No further questions at time of discharge.      Follow Up: [name], MD [address] [place] [address] [phone]  Schedule an appointment as soon as possible for a visit  As needed  [place], Dani Gobble, [place] [address] [phone]  Schedule an appointment as soon as possible for a visit  As needed     This chart was dictated using voice recognition software.  Despite best efforts to proofread,  errors can occur which can change the documentation meaning.   Fatima Blank, MD 05/08/17 [phone]

## 2017-05-08 NOTE — ED Notes (Signed)
This RN attempted to gain IV access and labs x2. Will ask another RN to attempt to gain access and draw labs.

## 2017-05-08 NOTE — Telephone Encounter (Signed)
Returned call to patient's wife.She stated husband woke up this morning with a different heart beat.Stated heart beat feels irregular.Stated he don't feel right,something different.No chest pain.No sob.Advised to go to ED.Trish notified.

## 2017-05-08 NOTE — ED Notes (Addendum)
Spoke with [employer] about patient's pacemaker. [employer] stated that the pacemaker the patient has is too old to work with our interrogation device

## 2017-05-08 NOTE — ED Triage Notes (Signed)
Patient reports felt heart was irregular beating this morning when he woke up.  Patient called cardiologist who couldn't get patient into office for appointment until tomorrow so advised to go to ED for further evaluation.  Patient reports falling Sunday evening after loosing balance when reaching for shoe. Patient has left rib pain from fall.

## 2017-05-08 NOTE — ED Notes (Signed)
Patient denies pain and is resting comfortably.  

## 2017-05-08 NOTE — ED Notes (Addendum)
This RN attempting to call [employer] to fix transmitter to re-try interrogating pacemaker.

## 2017-05-08 NOTE — ED Notes (Signed)
ED Provider at bedside. 

## 2017-05-08 NOTE — Telephone Encounter (Signed)
°  Patient c/o Palpitations:  High priority if patient c/o lightheadedness, shortness of breath, or chest pain  1) How long have you had palpitations/irregular HR/ Afib? Are you having the symptoms now? Aware of it this morning. Yes.   2) Are you currently experiencing lightheadedness, SOB or CP? no  3) Do you have a history of afib (atrial fibrillation) or irregular heart rhythm? Yes   4) Have you checked your BP or HR? (document readings if available): No  5) Are you experiencing any other symptoms? No, patient fell the night before last and hurt his left side.  Patient is scheduled to see Dr. Sallyanne Kuster tomorrow @ 8:40am

## 2017-05-09 ENCOUNTER — Encounter: Payer: Self-pay | Admitting: Cardiovascular Disease

## 2017-05-09 ENCOUNTER — Ambulatory Visit (INDEPENDENT_AMBULATORY_CARE_PROVIDER_SITE_OTHER): Payer: Medicare Other | Admitting: Cardiovascular Disease

## 2017-05-09 VITALS — BP 124/70 | HR 60 | Ht 69.0 in | Wt 159.0 lb

## 2017-05-09 DIAGNOSIS — I519 Heart disease, unspecified: Secondary | ICD-10-CM | POA: Diagnosis not present

## 2017-05-09 DIAGNOSIS — R002 Palpitations: Secondary | ICD-10-CM

## 2017-05-09 DIAGNOSIS — Z950 Presence of cardiac pacemaker: Secondary | ICD-10-CM

## 2017-05-09 DIAGNOSIS — E7800 Pure hypercholesterolemia, unspecified: Secondary | ICD-10-CM | POA: Diagnosis not present

## 2017-05-09 DIAGNOSIS — I442 Atrioventricular block, complete: Secondary | ICD-10-CM

## 2017-05-09 DIAGNOSIS — Z7901 Long term (current) use of anticoagulants: Secondary | ICD-10-CM

## 2017-05-09 DIAGNOSIS — I2510 Atherosclerotic heart disease of native coronary artery without angina pectoris: Secondary | ICD-10-CM

## 2017-05-09 DIAGNOSIS — I4891 Unspecified atrial fibrillation: Secondary | ICD-10-CM | POA: Diagnosis not present

## 2017-05-09 NOTE — Progress Notes (Signed)
Cardiology Office Note    Date:  05/11/2017   ID:  [name] [name], DOB [birth year], MRN [id number]  PCP:  [name] Manes, MD  Cardiologist:   [name] Klein, MD   No chief complaint on file.   History of Present Illness:  [name] [name] is a 83 y.o. male with complete heart block (pacemaker dependent), permanent atrial fibrillation, CAD s/p CABG, Alzheimer's dementia, small ascending thoracic aortic aneurysm (4 cm), hypertension and hyperlipidemia here for follow-up on his pacemaker (2009, [place] DR, programmed VVIR).  Yesterday he woke up stating that he felt that his heart was beating funny.  He was asked to go to the emergency room.  Evaluation there did not show significant new arrhythmia and his lab tests were normal.  He feels better now.  He did not complain of shortness of breath, chest pain or near syncope.  His EKG is nondiagnostic due to ventricular pacing.  Interrogation of his pacemaker shows normal device function.  He has atrial fibrillation with complete heart block.  He has almost 90% ventricular pacing with 2.2% PVCs.  Arrhythmia triggers were turned off to conserve battery, so the possible that he had nonsustained VT that we did not record.  The parameters are good and estimated battery longevity is 4.25-5.75 years.  Dementia has progressed gradually, but he remains well compensated with prompts from his wife and he appears to have good quality of life.  He did not remember me, but seem to recognize my name.  He has not had any new falls in the last 12 months.  He has not had she has bleeding complications on anticoagulation with warfarin.  He had one fall last November when he stepped off a curb. He did have head impact but his evaluation in the emergency room, including a CT head, showed benign findings  He had bypass surgery in 2010 [name] [name], LIMA to LAD, SVG to distal circumflex, SVG to distal RCA) and has not had any signs or symptoms of coronary disease  since that time.  He had a surgical maze ablation and ligation of left atrial appendage at that time. His nuclear stress test in August 2014 showed a dense inferolateral scar. Left ventricular ejection fraction was around 50%.   Past Medical History:  Diagnosis Date  . CAD (coronary artery disease)   . Dementia   . High cholesterol   . HTN (hypertension)   . Memory loss   . Mild dementia     Past Surgical History:  Procedure Laterality Date  . angiolplasty    . APPENDECTOMY    . INSERT / REPLACE / [place]    . PILONIDAL CYST EXCISION    . rotator cuff surgery  1985  . TONSILLECTOMY    . triple heart bypass      Current Medications: Outpatient Medications Prior to Visit  Medication Sig Dispense Refill  . atorvastatin (LIPITOR) 40 MG tablet Take 40 mg by mouth every evening.     . Cholecalciferol (VITAMIN D) 2000 UNITS CAPS Take 2,000 Units daily by mouth.     . donepezil (ARICEPT) 5 MG tablet Take 1 tablet (5 mg total) by mouth 2 (two) times daily. 180 tablet 3  . finasteride (PROSCAR) 5 MG tablet Take 5 mg by mouth every evening.     . Multiple Vitamin (MULTIVITAMIN) tablet Take 1 tablet by mouth daily.    . ranitidine (ZANTAC) 75 MG tablet Take 75 mg  by mouth 2 (two) times daily.    [name] warfarin (COUMADIN) 4 MG tablet Take 3-4 mg every evening by mouth.      No facility-administered medications prior to visit.      Allergies:   Amiodarone and Antihistamines, diphenhydramine-type   Social History   Socioeconomic History  . Marital status: Married    Spouse name: [name] [name]  . Number of children: 6  . Years of education: college  . Highest education level: None  Social Needs  . Financial resource strain: None  . Food insecurity - worry: None  . Food insecurity - inability: None  . Transportation needs - medical: None  . Transportation needs - non-medical: None  Occupational History  . None  Tobacco Use  . Smoking status: Former Smoker     Last attempt to quit: 07/02/1974    Years since quitting: 42.8  . Smokeless tobacco: Never Used  Substance and Sexual Activity  . Alcohol use: Yes    Comment: 2-3 "stiff" drinks per day  . Drug use: No  . Sexual activity: None  Other Topics Concern  . None  Social History Narrative   Patient is married Artist) and lives at home with his wife.   Patient has a college education.   Patient is a retired Materials engineer.   Patient does not drink any caffeine.   Patient has six children.     Family History:  The patient's family history includes COPD in his sister; Cervical cancer in his unknown relative; Dementia in his brother; Stroke in his father.   ROS:   Please see the history of present illness.    ROS all other systems are reviewed and are negative.  Some limitations due to memory problems.   PHYSICAL EXAM:   VS:  BP 124/70 (BP Location: Right Arm, Patient Position: Sitting, Cuff Size: Normal)   Pulse 60   Ht 5\' 9"  (1.753 m)   Wt 159 lb (72.1 kg)   BMI 23.48 kg/m     General: Alert, oriented x3, no distress, lean, mild kyphosis Head: no evidence of trauma, PERRL, EOMI, no exophtalmos or lid lag, no myxedema, no xanthelasma; normal ears, nose and oropharynx Neck: normal jugular venous pulsations and no hepatojugular reflux; brisk carotid pulses without delay and no carotid bruits Chest: clear to auscultation, no signs of consolidation by percussion or palpation, normal fremitus, symmetrical and full respiratory excursions Cardiovascular: normal position and quality of the apical impulse, regular rhythm, normal first and paradoxically split second heart sounds, no murmurs, rubs or gallops.  Healthy left subclavian pacemaker site Abdomen: no tenderness or distention, no masses by palpation, no abnormal pulsatility or arterial bruits, normal bowel sounds, no hepatosplenomegaly Extremities: no clubbing, cyanosis or edema; 2+ radial, ulnar and brachial pulses  bilaterally; 2+ right femoral, posterior tibial and dorsalis pedis pulses; 2+ left femoral, posterior tibial and dorsalis pedis pulses; no subclavian or femoral bruits Neurological: grossly nonfocal Psych: Normal mood and affect   Wt Readings from Last 3 Encounters:  05/09/17 159 lb (72.1 kg)  08/01/16 163 lb (73.9 kg)  07/09/16 165 lb (74.8 kg)      Studies/Labs Reviewed:   EKG:  EKG is ordered today.  The ekg ordered today shows atrial fibrillation with ventricular pacing.  There is no evidence of excessive QT prolongation  BMET    Component Value Date/Time   NA 137 05/08/2017 1318   K 4.2 05/08/2017 1318   CL 100 (L) 05/08/2017 1318  CO2 29 05/08/2017 1318   GLUCOSE 92 05/08/2017 1318   BUN 13 05/08/2017 1318   CREATININE 1.03 05/08/2017 1318   CALCIUM 9.0 05/08/2017 1318   GFRNONAA >60 05/08/2017 1318   GFRAA >60 05/08/2017 1318     ASSESSMENT:    1. Palpitations   2. CHB (complete heart block) (HCC)   3. Atrial fibrillation with slow ventricular response ([place])   4. Long term current use of anticoagulant   5. Coronary artery disease involving native coronary artery of native heart without angina pectoris   6. Hypercholesterolemia   7. Left ventricular systolic dysfunction without heart failure   8. Pacemaker      PLAN:  In order of problems listed above:  1. Palpitations: Cannot exclude nonsustained VT in this gentleman with known coronary artery disease and borderline left ventricular systolic function.  He did not have syncope.  We turned on the arrhythmia trigger for high ventricular rates on his device.  Advised Mrs. Mel Almond that she should bring Tra[place] to the emergency room if he has syncope/near syncope, shortness of breath or chest pain in association with this palpitations.  Otherwise I would simply advise a call to the office and we can perform an ad hoc pacemaker clinic visit to download for arrhythmia events.  2. 2nd/3rd deg AV block: For safety should  be considered device dependent.  He has 98% ventricular pacing with frequent PVCs. 3. AFib: Slow ventricular response without medications. CHADSVasc 4 (age 57, CAD, LV dysf). On warfarin anticoagulation, without recent falls or bleeding problems.  She did have left atrial appendage clipping when he had bypass surgery and we can more liberally consider discontinuation of anticoagulants if bleeding complications become an issue. 4. Warfarin:  without bleeding complications.  Had a fall last November but none since. 5. CAD s/p CABG: Denies angina pectoris. 6. HLP: On high-dose statin, labs followed by [name] 7. Borderline LV dysfunction: Without manifestations of heart failure.  I think it is better to avoid polypharmacy in this elderly gentleman with dementia.  I would not start RAAS inhibitors. 8. PM: Normal pacemaker function.  His device is not amenable to remote downloads.     Medication Adjustments/Labs and Tests Ordered: Current medicines are reviewed at length with the patient today.  Concerns regarding medicines are outlined above.  Medication changes, Labs and Tests ordered today are listed in the Patient Instructions below. Patient Instructions  Dr Sallyanne Kuster recommends that you schedule a follow-up appointment in JANUARY 2019.  If you need a refill on your cardiac medications before your next appointment, please call your pharmacy.    Signed, [name] Klein, MD  05/11/2017 10:22 AM    [facility] [address], [place], [place]  [address] Phone: [phone]; Fax: [phone]

## 2017-05-09 NOTE — Patient Instructions (Addendum)
Dr Sallyanne Kuster recommends that you schedule a follow-up appointment in JANUARY 2019.  If you need a refill on your cardiac medications before your next appointment, please call your pharmacy.

## 2017-05-11 ENCOUNTER — Encounter: Payer: Self-pay | Admitting: Cardiovascular Disease

## 2017-05-11 DIAGNOSIS — I519 Heart disease, unspecified: Secondary | ICD-10-CM | POA: Insufficient documentation

## 2017-05-11 DIAGNOSIS — E7800 Pure hypercholesterolemia, unspecified: Secondary | ICD-10-CM | POA: Insufficient documentation

## 2017-05-14 ENCOUNTER — Other Ambulatory Visit: Payer: Self-pay | Admitting: Cardiovascular Disease

## 2017-05-14 NOTE — Addendum Note (Signed)
Addended by: Therisa Doyne on: 05/14/2017 04:13 PM   Modules accepted: Orders

## 2017-05-15 DIAGNOSIS — I481 Persistent atrial fibrillation: Secondary | ICD-10-CM | POA: Diagnosis not present

## 2017-06-05 DIAGNOSIS — I481 Persistent atrial fibrillation: Secondary | ICD-10-CM | POA: Diagnosis not present

## 2017-07-10 DIAGNOSIS — I481 Persistent atrial fibrillation: Secondary | ICD-10-CM | POA: Diagnosis not present

## 2017-07-15 ENCOUNTER — Encounter: Payer: Self-pay | Admitting: Neurology

## 2017-07-15 ENCOUNTER — Ambulatory Visit (INDEPENDENT_AMBULATORY_CARE_PROVIDER_SITE_OTHER): Payer: Medicare Other | Admitting: Neurology

## 2017-07-15 VITALS — BP 107/63 | HR 65 | Ht 71.0 in | Wt 160.0 lb

## 2017-07-15 DIAGNOSIS — Z950 Presence of cardiac pacemaker: Secondary | ICD-10-CM

## 2017-07-15 DIAGNOSIS — I441 Atrioventricular block, second degree: Secondary | ICD-10-CM | POA: Diagnosis not present

## 2017-07-15 DIAGNOSIS — Z951 Presence of aortocoronary bypass graft: Secondary | ICD-10-CM | POA: Insufficient documentation

## 2017-07-15 DIAGNOSIS — F0390 Unspecified dementia without behavioral disturbance: Secondary | ICD-10-CM | POA: Diagnosis not present

## 2017-07-15 MED ORDER — DONEPEZIL HCL 5 MG PO TABS: 5.0000 mg | ORAL_TABLET | Freq: Two times a day (BID) | ORAL | 3 refills | Status: DC

## 2017-07-15 NOTE — Progress Notes (Signed)
[facility]  PATIENT: [name] [name] DOB: 07-05-1925   REASON FOR VISIT: follow-up for memory loss/dementia HISTORY FROM: The patient and his wife  HISTORY OF PRESENT ILLNESS:  I have pleasure of seeing [name] today, an 84 year old Caucasian right-handed gentleman who has been followed for memory evaluations on a six-month interval basis. Today 07/09/2016 and is Mini-Mental Status Examination revealed 20 out of 30 points today the last evaluation was 25 out of 30. The patient has been stable since summer 2016 with points between 25 and 24 out of 30. [name], 84 year old male returns for follow-up. He has history of Alzheimer's dementia. He continues to be able to complete his ADLs independently, he does not have a driver's license. He is currently on Aricept 5 mg twice a day for GI  Reasons.  His memory score is stable on Aricept alone. He gets his medications from the [place]. He does not exercise his memory with puzzles or crosswords etc. He gets no regular exercise.   Had one recent fall, no injuries resulted. His wife is here confirming his memory is poorer than last year- just last week they went to the cinema together and watched a movie about Laqueta Linden, as a left the cinema Mr. Ramnauth asked what the movie was about he had forgotten. He could also not recall the movie when his wife explained what they had just watched- he believes he was asleep- his wife denied this.  His gait has deteriorated.  Mr Reep grew up in [place] and in [place], [place]. He has a lot of interesting stories about his education at [employer]. in [place] in the 1940's.    I have the pleasure of seeing Mr Boike today. 07-15-2016, We are talking , once gain, about his 6 years in [place] and his long family history . His wife is concerned about his short term memory , he is not. MMSE is stable. He will be 89 next month.  No REM behavior.  Had 2 falls I the last 12 month- one while carrying a 25 pound  bag of ice melt, refusing any help. One other near fall let to a visit at [facility] ED, was related to arrhythmia. They called Dr. Benn Moulder and were told to come to the ED- no arrhythmia proven. [name]  MMSE - Mini Mental State Exam 07/15/2017 07/09/2016 01/04/2016 07/05/2015  Orientation to time 4 1 2 3   Orientation to Place 4 4 3 3   Registration 3 3 3 3   Attention/ Calculation 2 4 4 4   Recall 1 0 1 1  Language- name 2 objects 2 2 2 2   Language- repeat 1 1 1 1   Language- follow 3 step command 3 3 3 3   Language- read & follow direction 1 1 1 1   Write a sentence 0 1 1 1   Copy design 1 0 1 1  Total score 22 20 22 23       HISTORY: [name] is a 84 year old male with a history of dementia. He returns today for follow-up. He is currently taking Aricept 10 mg and tolerating it well. The patient states that his memory is "good." his wife reports that she feels that it has remained the same. The patient is able to complete all ADLs independently.  The patient has a driver's licenses but no longer drives. His wife does all the driving.  He denies having to give up anything due to his memory. The wife states that the patient's behavior has not  been an issue.  On 01-26-15 the patient scored 25 out of 30 points and the Mini-Mental Status Examination for the last 12 months he has been very stable on Aricept alone. [name], his primary care physician, asked if Namenda could be added and indeed it could be. I am often combined the 2 medications Aricept and Namenda and they can be taken in the generic form or even in a combination product as one pill called Namziric. I will start Mr. Blackwelder on a starter pack for once a day namenda.  He is interested in stopping protonix, and he may get by with Tumms at night. A trial of 4-6 weeks would suffice to show if he is still having reflux.   REVIEW OF SYSTEMS: Full 14 system review of systems performed and notable only for those listed, all others are neg:    Genitourinary kidney stone in the left kidney. Bradycardia-  That's we we don't increase Aricept.  Hematology/Lymphatic: easy bruising Neurological: stable mild - moderate dementia. Long term memory intact, short term memory much impaired.  ALLERGIES: Allergies  Allergen Reactions  . Amiodarone Other (See Comments)    Had a severe lung infection in 2003  . Antihistamines, Diphenhydramine-Type Other (See Comments)    Jittery    HOME MEDICATIONS: Outpatient Medications Prior to Visit  Medication Sig Dispense Refill  . atorvastatin (LIPITOR) 40 MG tablet Take 40 mg by mouth every evening.     . Cholecalciferol (VITAMIN D) 2000 UNITS CAPS Take 2,000 Units daily by mouth.     . donepezil (ARICEPT) 5 MG tablet Take 1 tablet (5 mg total) by mouth 2 (two) times daily. 180 tablet 3  . finasteride (PROSCAR) 5 MG tablet Take 5 mg by mouth every evening.     . Multiple Vitamin (MULTIVITAMIN) tablet Take 1 tablet by mouth daily.    . ranitidine (ZANTAC) 75 MG tablet Take 75 mg by mouth 2 (two) times daily.    [name] warfarin (COUMADIN) 4 MG tablet Take 4 mg by mouth every evening.      No facility-administered medications prior to visit.     PAST MEDICAL HISTORY: Past Medical History:  Diagnosis Date  . CAD (coronary artery disease)   . Dementia   . High cholesterol   . HTN (hypertension)   . Memory loss   . Mild dementia     PAST SURGICAL HISTORY: Past Surgical History:  Procedure Laterality Date  . angiolplasty    . APPENDECTOMY    . INSERT / REPLACE / [place]    . PILONIDAL CYST EXCISION    . rotator cuff surgery  1985  . TONSILLECTOMY    . triple heart bypass      FAMILY HISTORY: Family History  Problem Relation Age of Onset  . Stroke Father   . Cervical cancer Unknown        siblings  . COPD Sister   . Dementia Brother     SOCIAL HISTORY: Social History   Socioeconomic History  . Marital status: Married    Spouse name: [name]  .  Number of children: 6  . Years of education: college  . Highest education level: Not on file  Social Needs  . Financial resource strain: Not on file  . Food insecurity - worry: Not on file  . Food insecurity - inability: Not on file  . Transportation needs - medical: Not on file  . Transportation needs - non-medical: Not on  file  Occupational History  . Not on file  Tobacco Use  . Smoking status: Former Smoker    Last attempt to quit: 07/02/1974    Years since quitting: 43.0  . Smokeless tobacco: Never Used  Substance and Sexual Activity  . Alcohol use: Yes    Comment: 2-3 "stiff" drinks per day  . Drug use: No  . Sexual activity: Not on file  Other Topics Concern  . Not on file  Social History Narrative   Patient is married [name]) and lives at home with his wife.   Patient has a college education.   Patient is a retired Materials engineer.   Patient does not drink any caffeine.   Patient has six children.     PHYSICAL EXAM  Vitals:   07/15/17 1055  BP: 107/63  Pulse: 65  Weight: 160 lb (72.6 kg)  Height: 5\' 11"  (1.803 m)   Body mass index is 22.32 kg/m. Generalized: Well developed, in no acute distress   Neurological examination / Mentation: Alert oriented to time, place, history taking. Follows all commands speech and language fluent. mini-mental status examination- see above. MMSE 22/30. AFT 3. Clock drawing 4/4.  Cranial nerve , Pupils were equal round reactive to light. Extraocular movements were full, visual field were full on confrontational test. Facial sensation and strengthwere normal. Uvula tongue midline. Head turning and shoulder shrug were restricted by his scoliosis Motor:  5 / 5 strength in upper extremities, good grip strength.  Sensory: Sensory testing is intact to soft touch on all 4 extremities. No evidence of extinction is noted.  Coordination: Cerebellar testing reveals good finger-nose-finger and heel-to-shin bilaterally.  Gait  and station: Posture is stooped ,Gait is normal- without cane or walker. Tandem gait deferred. Reflexes: Deep tendon reflexes are not brisk- they are symmetric  bilaterally. Babinski down.  DIAGNOSTIC DATA (LABS, IMAGING, TESTING)  ASSESSMENT AND PLAN : 84 y.o. year old male  has a past medical history of HTN (hypertension); CAD (coronary artery disease); High cholesterol;  Memory loss; and Dementia. here to follow-up . His memory score is stable. The patient is a current patient of  Cecille Rubin NP and myself, Dr. Brett Fairy  Continue Aricept at current dose meds from [place].  Follow up in 6 -8 months with Dr. [facility] [address], [place], [place] [address] [phone]

## 2017-07-15 NOTE — Patient Instructions (Signed)
Dementia Disease Caregiver Guide A person who has Alzheimer disease may not be able to take care of himself or herself. He or she may need help with simple tasks. The tips below can help you care for the person. Memory loss and confusion If the person is confused or cannot remember things:  Stay calm.  Respond with a short answer.  Avoid correcting him or her in a way that sounds like scolding.  Try not to take it personally, even if he or she forgets your name.  Behavior changes The person may go through behavior changes. This can include depression, anxiety, anger, or seeing things that are not there. When behavior changes:  Try not to take behavior changes personally.  Stay calm and patient.  Do not argue or try to convince the person about a specific point.  Know that these changes are part of the disease process. Try to work through it.  Tips to lessen frustration  Make appointments and do daily tasks when the person is at his or her best.  Take your time. Simple tasks may take longer. Allow plenty of time to complete tasks.  Limit choices for the person.  Involve the person in what you are doing.  Stick to a routine.  Avoid new or crowded places, if possible.  Use simple words, short sentences, and a calm voice. Only give 1 direction at a time.  Buy clothes and shoes that are easy to put on and take off.  Let people help if they offer. Home safety  Keep floors clear. Remove rugs, magazine racks, and floor lamps.  Keep hallways well lit.  Put a handrail and nonslip mat in the bathtub or shower.  Put childproof locks on cabinets that have dangerous items in them. These items include medicine, alcohol, guns, toxic cleaning items, sharp tools, matches, or lighters.  Place locks on doors where the person cannot see or reach them. This helps the person to not wander out of the house and get lost.  Be prepared for emergencies. Keep a list of emergency phone  numbers and addresses in a handy area. Plans for the future  Talk about finances. ? Talk about money management. People with Alzheimer disease have trouble managing their money as the disease gets worse. ? Get help from professional advisors about financial and legal matters.  Talk about future care. ? Choose a power of attorney. This is someone who can make decisions for the person with Alzheimer disease when he or she can no longer do so. ? Talk about driving and when it is the right time to stop. The person's doctor can help with this. ? If the person lives alone, make sure he or she is safe. Some people need extra help at home. Other people need more care at a nursing home or care center. Support groups Some benefits of joining a support group include:  Learning ways to manage stress.  Sharing experiences with others.  Getting emotional comfort and support.  Learning new caregiving skills as the disease progresses.  Knowing what community resources are available and taking advantage of them.  Get help if:  The person has a fever.  The person has a sudden behavior change that does not get better with calming strategies.  The person is unable to manage his or her living situation.  The person threatens you or anyone else, including himself or herself.  You are no longer able to care for the person. This information is not  intended to replace advice given to you by your health care provider. Make sure you discuss any questions you have with your health care provider. Document Released: 09/10/2011 Document Revised: 11/24/2015 Document Reviewed: 08/08/2011 Elsevier Interactive Patient Education  2017 [employer]  

## 2017-07-31 DIAGNOSIS — I481 Persistent atrial fibrillation: Secondary | ICD-10-CM | POA: Diagnosis not present

## 2017-08-09 ENCOUNTER — Ambulatory Visit (INDEPENDENT_AMBULATORY_CARE_PROVIDER_SITE_OTHER): Payer: Medicare Other | Admitting: Cardiovascular Disease

## 2017-08-09 ENCOUNTER — Encounter: Payer: Self-pay | Admitting: Cardiovascular Disease

## 2017-08-09 VITALS — BP 132/74 | HR 72 | Ht 72.0 in | Wt 160.0 lb

## 2017-08-09 DIAGNOSIS — I441 Atrioventricular block, second degree: Secondary | ICD-10-CM

## 2017-08-09 DIAGNOSIS — I4891 Unspecified atrial fibrillation: Secondary | ICD-10-CM | POA: Diagnosis not present

## 2017-08-09 DIAGNOSIS — I2510 Atherosclerotic heart disease of native coronary artery without angina pectoris: Secondary | ICD-10-CM | POA: Diagnosis not present

## 2017-08-09 DIAGNOSIS — Z7901 Long term (current) use of anticoagulants: Secondary | ICD-10-CM

## 2017-08-09 DIAGNOSIS — I442 Atrioventricular block, complete: Secondary | ICD-10-CM | POA: Diagnosis not present

## 2017-08-09 DIAGNOSIS — I519 Heart disease, unspecified: Secondary | ICD-10-CM

## 2017-08-09 DIAGNOSIS — Z950 Presence of cardiac pacemaker: Secondary | ICD-10-CM

## 2017-08-09 NOTE — Progress Notes (Signed)
Cardiology Office Note    Date:  08/09/2017   ID:  [name] [name], DOB 03/30/1926, MRN [id number]  PCP:  [name], MD  Cardiologist:   [name], MD   Chief Complaint  Patient presents with  . Follow-up  Pacemaker check, CAD, palpitations  History of Present Illness:  [name] [name] is a 84 y.o. male with complete heart block (pacemaker dependent), permanent atrial fibrillation, CAD s/p CABG, Alzheimer's dementia, small ascending thoracic aortic aneurysm (4 cm), hypertension and hyperlipidemia here for follow-up on his pacemaker (2009, [employer] Zephyr XL DR, programmed VVIR).  No further complaints of palpitations.  He is a very intelligent gentleman and extremely pleasant and charming, but his memory is obviously worse.  He asked me the same question about 4 times in a span of 20 minutes.  In the last 12 months he has had only had one serious fall.  (That's the second fall in the last 3 years).  Thankfully he did not have any bleeding complications.  We turned on EGM monitoring at his last appointment since he had some complaints of palpitations, but the device has not recorded any meaningful abnormalities in 3 palpitations have not been a problem.  The electrogram monitoring has a severe impact on battery longevity, dropping it in half, so we will turn it off today.  This increased the generator expected longevity from 1.75-2.50 years up to 4-5 years  Interrogation of his pacemaker shows normal device function.  He has atrial fibrillation with high grade AVt block.  He has 97% ventricular pacing.  Today he does have an underlying rhythm at about 35 bpm, but this has been inconsistently present at previous evaluation..  The lead parameters were all manually updated today and remain excellent.  Dementia has progressed gradually, but he remains well compensated with prompts from his wife and he appears to have good quality of life.   He had bypass surgery in 2010 [name], LIMA  to LAD, SVG to distal circumflex, SVG to distal RCA) and has not had any signs or symptoms of coronary disease since that time.  He had a surgical maze ablation and ligation of left atrial appendage at that time. His nuclear stress test in August 2014 showed a dense inferolateral scar. Left ventricular ejection fraction was around 50%.   Past Medical History:  Diagnosis Date  . CAD (coronary artery disease)   . Dementia   . High cholesterol   . HTN (hypertension)   . Memory loss   . Mild dementia     Past Surgical History:  Procedure Laterality Date  . angiolplasty    . APPENDECTOMY    . INSERT / REPLACE / [place]    . PILONIDAL CYST EXCISION    . rotator cuff surgery  1985  . TONSILLECTOMY    . triple heart bypass      Current Medications: Outpatient Medications Prior to Visit  Medication Sig Dispense Refill  . atorvastatin (LIPITOR) 40 MG tablet Take 40 mg by mouth every evening.     . Cholecalciferol (VITAMIN D) 2000 UNITS CAPS Take 2,000 Units daily by mouth.     . donepezil (ARICEPT) 5 MG tablet Take 1 tablet (5 mg total) by mouth 2 (two) times daily. 180 tablet 3  . finasteride (PROSCAR) 5 MG tablet Take 5 mg by mouth every evening.     . ranitidine (ZANTAC) 75 MG tablet Take 75 mg by mouth 2 (two)  times daily.    [name] warfarin (COUMADIN) 4 MG tablet Take 4 mg by mouth every evening.     . Multiple Vitamin (MULTIVITAMIN) tablet Take 1 tablet by mouth daily.     No facility-administered medications prior to visit.      Allergies:   Amiodarone and Antihistamines, diphenhydramine-type   Social History   Socioeconomic History  . Marital status: Married    Spouse name: [name]  . Number of children: 6  . Years of education: college  . Highest education level: None  Social Needs  . Financial resource strain: None  . Food insecurity - worry: None  . Food insecurity - inability: None  . Transportation needs - medical: None  . Transportation  needs - non-medical: None  Occupational History  . None  Tobacco Use  . Smoking status: Former Smoker    Last attempt to quit: 07/02/1974    Years since quitting: 43.1  . Smokeless tobacco: Never Used  Substance and Sexual Activity  . Alcohol use: Yes    Comment: 2-3 "stiff" drinks per day  . Drug use: No  . Sexual activity: None  Other Topics Concern  . None  Social History Narrative   Patient is married Artist) and lives at home with his wife.   Patient has a college education.   Patient is a retired Materials engineer.   Patient does not drink any caffeine.   Patient has six children.     Family History:  The patient's family history includes COPD in his sister; Cervical cancer in his unknown relative; Dementia in his brother; Stroke in his father.   ROS:   Please see the history of present illness.    ROS all other systems are reviewed and are negative.  Some limitations due to memory problems.   PHYSICAL EXAM:   VS:  BP 132/74   Pulse 72   Ht 6' (1.829 m)   Wt 160 lb (72.6 kg)   BMI 21.70 kg/m    General: Alert, oriented x3, no distress, very lean Head: no evidence of trauma, PERRL, EOMI, no exophtalmos or lid lag, no myxedema, no xanthelasma; normal ears, nose and oropharynx Neck: normal jugular venous pulsations and no hepatojugular reflux; brisk carotid pulses without delay and no carotid bruits Chest: clear to auscultation, no signs of consolidation by percussion or palpation, normal fremitus, symmetrical and full respiratory excursions Cardiovascular: normal position and quality of the apical impulse, regular rhythm, normal first and paradoxically split second heart sounds, no murmurs, rubs or gallops.  Healthy left subclavian pacemaker site Abdomen: no tenderness or distention, no masses by palpation, no abnormal pulsatility or arterial bruits, normal bowel sounds, no hepatosplenomegaly Extremities: no clubbing, cyanosis or edema; 2+ radial, ulnar and  brachial pulses bilaterally; 2+ right femoral, posterior tibial and dorsalis pedis pulses; 2+ left femoral, posterior tibial and dorsalis pedis pulses; no subclavian or femoral bruits Neurological: grossly nonfocal.  Very poor short-term memory Psych: Normal mood and affect   Wt Readings from Last 3 Encounters:  08/09/17 160 lb (72.6 kg)  07/15/17 160 lb (72.6 kg)  05/09/17 159 lb (72.1 kg)      Studies/Labs Reviewed:   EKG:  EKG is not ordered today.    BMET    Component Value Date/Time   NA 137 05/08/2017 1318   K 4.2 05/08/2017 1318   CL 100 (L) 05/08/2017 1318   CO2 29 05/08/2017 1318   GLUCOSE 92 05/08/2017 1318   BUN 13 05/08/2017  1318   CREATININE 1.03 05/08/2017 1318   CALCIUM 9.0 05/08/2017 1318   GFRNONAA >60 05/08/2017 1318   GFRAA >60 05/08/2017 1318    ASSESSMENT:    1. Atrial fibrillation with slow ventricular response ([place])   2. CHB (complete heart block) (HCC)   3. Second degree AV block, Mobitz type II   4. Long term (current) use of anticoagulants   5. Coronary artery disease involving native coronary artery of native heart without angina pectoris   6. Left ventricular systolic dysfunction without heart failure   7. Pacemaker      PLAN:  In order of problems listed above:  1. 2nd/3rd deg AV block: For safety should be considered device dependent.  There was an underlying rhythm today at about 35 bpm, but this has not always been present. 2. AFib: Slow ventricular response without medications. CHADSVasc 4 (age 81, CAD, LV dysf). On warfarin anticoagulation, with a couple of falls in the last 3 years.  He did have left atrial appendage clipping when he had bypass surgery and if falls increase in frequency I would recommend discontinuing his anticoagulation 3. Warfarin:  without bleeding complications.  Falls in November 2017 and January 2018, both mechanical. 4. CAD s/p CABG: Denies angina pectoris. On high-dose statin, labs followed by Dr.  Felipa Eth 5. Borderline LV dysfunction:  this is clinically asymptomatic without any evidence of hypervolemia.  I do not think we should put this elderly gentleman on more medications. 6. PM: Normal pacemaker function.  He cannot be remote downloads will bring him back in 6 months. No evidence of high ventricular rates after 3 months of monitoring.  Will turn device monitoring off since this leads to a 3-year loss of battery longevity.   Medication Adjustments/Labs and Tests Ordered: Current medicines are reviewed at length with the patient today.  Concerns regarding medicines are outlined above.  Medication changes, Labs and Tests ordered today are listed in the Patient Instructions below. Patient Instructions  Dr Sallyanne Kuster recommends that you schedule a follow-up appointment in 6 months with a pacemaker check. You will receive a reminder letter in the mail two months in advance. If you don't receive a letter, please call our office to schedule the follow-up appointment.  If you need a refill on your cardiac medications before your next appointment, please call your pharmacy.    Signed, [name], MD  08/09/2017 5:43 PM    [facility] [address], [place], [place]  [address] Phone: [phone]; Fax: [phone]

## 2017-08-09 NOTE — Patient Instructions (Signed)
Dr Croitoru recommends that you schedule a follow-up appointment in 6 months with a pacemaker check. You will receive a reminder letter in the mail two months in advance. If you don't receive a letter, please call our office to schedule the follow-up appointment.  If you need a refill on your cardiac medications before your next appointment, please call your pharmacy. 

## 2017-08-12 LAB — CUP PACEART INCLINIC DEVICE CHECK
Date Time Interrogation Session: 20190211134620
Implantable Lead Implant Date: 20010830
Implantable Lead Location: 753859
Implantable Pulse Generator Implant Date: 20090722
Lead Channel Setting Pacing Pulse Width: 0.5 ms
Lead Channel Setting Sensing Sensitivity: 2 mV
MDC IDC LEAD IMPLANT DT: 20010830
MDC IDC LEAD LOCATION: 753860
Pulse Gen Serial Number: 1216574

## 2017-08-14 ENCOUNTER — Other Ambulatory Visit: Payer: Self-pay

## 2017-08-14 DIAGNOSIS — D229 Melanocytic nevi, unspecified: Secondary | ICD-10-CM | POA: Diagnosis not present

## 2017-08-14 DIAGNOSIS — L820 Inflamed seborrheic keratosis: Secondary | ICD-10-CM | POA: Diagnosis not present

## 2017-08-14 DIAGNOSIS — D485 Neoplasm of uncertain behavior of skin: Secondary | ICD-10-CM | POA: Diagnosis not present

## 2017-08-14 DIAGNOSIS — L570 Actinic keratosis: Secondary | ICD-10-CM | POA: Diagnosis not present

## 2017-08-14 DIAGNOSIS — L821 Other seborrheic keratosis: Secondary | ICD-10-CM | POA: Diagnosis not present

## 2017-08-21 DIAGNOSIS — I481 Persistent atrial fibrillation: Secondary | ICD-10-CM | POA: Diagnosis not present

## 2017-09-04 DIAGNOSIS — I481 Persistent atrial fibrillation: Secondary | ICD-10-CM | POA: Diagnosis not present

## 2017-10-02 DIAGNOSIS — I481 Persistent atrial fibrillation: Secondary | ICD-10-CM | POA: Diagnosis not present

## 2017-10-23 DIAGNOSIS — I481 Persistent atrial fibrillation: Secondary | ICD-10-CM | POA: Diagnosis not present

## 2017-11-11 DIAGNOSIS — L819 Disorder of pigmentation, unspecified: Secondary | ICD-10-CM | POA: Diagnosis not present

## 2017-11-13 DIAGNOSIS — I481 Persistent atrial fibrillation: Secondary | ICD-10-CM | POA: Diagnosis not present

## 2017-11-22 ENCOUNTER — Telehealth: Payer: Self-pay | Admitting: Cardiovascular Disease

## 2017-11-22 NOTE — Telephone Encounter (Signed)
Pt's wife calling   Stated pt is having fatigue and staying in the bed more. Pt's wife want to speak to nurse to see if pt need to come in to be seen sooner. Please call wife.

## 2017-11-22 NOTE — Telephone Encounter (Signed)
Spoke with pt's wife who states that she has notice he an increase in fatigue and laying around a little more than normal in pt. She reports that is the only symptom he is exhibiting at the moment. She state pt was to have pacermaker check in aug but would like to be seen sooner. Earliest available appointment is 01/08/18 at 840 am. Routing to Dr. Loletha Grayer for further recommendation or if he would like pt to be seen sooner.

## 2017-11-23 NOTE — Telephone Encounter (Signed)
Go ahead and move that appt up to July 10 and let them know we can bring in sooner if anything opens up [facility]

## 2017-11-26 NOTE — Telephone Encounter (Signed)
Spoke with pt's wife and informed of Dr. Lurline Del recommendation. Verbalized understanding.

## 2017-11-27 ENCOUNTER — Other Ambulatory Visit: Payer: Self-pay

## 2017-11-27 DIAGNOSIS — I481 Persistent atrial fibrillation: Secondary | ICD-10-CM | POA: Diagnosis not present

## 2017-11-27 DIAGNOSIS — I10 Essential (primary) hypertension: Secondary | ICD-10-CM | POA: Diagnosis not present

## 2017-11-27 DIAGNOSIS — Z79899 Other long term (current) drug therapy: Secondary | ICD-10-CM | POA: Diagnosis not present

## 2017-11-27 DIAGNOSIS — C44619 Basal cell carcinoma of skin of left upper limb, including shoulder: Secondary | ICD-10-CM | POA: Diagnosis not present

## 2017-12-11 DIAGNOSIS — I481 Persistent atrial fibrillation: Secondary | ICD-10-CM | POA: Diagnosis not present

## 2017-12-16 DIAGNOSIS — L820 Inflamed seborrheic keratosis: Secondary | ICD-10-CM | POA: Diagnosis not present

## 2017-12-16 DIAGNOSIS — D485 Neoplasm of uncertain behavior of skin: Secondary | ICD-10-CM | POA: Diagnosis not present

## 2017-12-20 ENCOUNTER — Other Ambulatory Visit: Payer: Self-pay | Admitting: Geriatric Medicine

## 2017-12-20 DIAGNOSIS — R011 Cardiac murmur, unspecified: Secondary | ICD-10-CM | POA: Diagnosis not present

## 2017-12-20 DIAGNOSIS — G301 Alzheimer's disease with late onset: Secondary | ICD-10-CM | POA: Diagnosis not present

## 2017-12-20 DIAGNOSIS — I481 Persistent atrial fibrillation: Secondary | ICD-10-CM | POA: Diagnosis not present

## 2017-12-20 DIAGNOSIS — I700 Atherosclerosis of aorta: Secondary | ICD-10-CM | POA: Diagnosis not present

## 2017-12-20 DIAGNOSIS — Z79899 Other long term (current) drug therapy: Secondary | ICD-10-CM | POA: Diagnosis not present

## 2017-12-20 DIAGNOSIS — I10 Essential (primary) hypertension: Secondary | ICD-10-CM | POA: Diagnosis not present

## 2017-12-20 DIAGNOSIS — Z0000 Encounter for general adult medical examination without abnormal findings: Secondary | ICD-10-CM | POA: Diagnosis not present

## 2017-12-20 DIAGNOSIS — I714 Abdominal aortic aneurysm, without rupture, unspecified: Secondary | ICD-10-CM

## 2017-12-20 DIAGNOSIS — Z1389 Encounter for screening for other disorder: Secondary | ICD-10-CM | POA: Diagnosis not present

## 2017-12-20 DIAGNOSIS — E7800 Pure hypercholesterolemia, unspecified: Secondary | ICD-10-CM | POA: Diagnosis not present

## 2017-12-27 ENCOUNTER — Encounter: Payer: Self-pay | Admitting: *Deleted

## 2018-01-03 ENCOUNTER — Ambulatory Visit: Payer: Medicare Other

## 2018-01-06 ENCOUNTER — Ambulatory Visit
Admission: RE | Admit: 2018-01-06 | Discharge: 2018-01-06 | Disposition: A | Discharge status: Home / Self Care | Payer: Medicare Other | Source: Ambulatory Visit | Attending: Geriatric Medicine | Admitting: Geriatric Medicine

## 2018-01-06 DIAGNOSIS — I714 Abdominal aortic aneurysm, without rupture, unspecified: Secondary | ICD-10-CM

## 2018-01-08 ENCOUNTER — Encounter: Payer: Medicare Other | Admitting: Cardiovascular Disease

## 2018-01-08 DIAGNOSIS — E7800 Pure hypercholesterolemia, unspecified: Secondary | ICD-10-CM | POA: Diagnosis not present

## 2018-01-08 DIAGNOSIS — I481 Persistent atrial fibrillation: Secondary | ICD-10-CM | POA: Diagnosis not present

## 2018-01-09 ENCOUNTER — Ambulatory Visit (INDEPENDENT_AMBULATORY_CARE_PROVIDER_SITE_OTHER): Payer: Medicare Other | Admitting: Cardiovascular Disease

## 2018-01-09 ENCOUNTER — Encounter: Payer: Self-pay | Admitting: Cardiovascular Disease

## 2018-01-09 VITALS — BP 122/72 | HR 60 | Ht 70.0 in | Wt 156.0 lb

## 2018-01-09 DIAGNOSIS — I519 Heart disease, unspecified: Secondary | ICD-10-CM

## 2018-01-09 DIAGNOSIS — Z7901 Long term (current) use of anticoagulants: Secondary | ICD-10-CM | POA: Diagnosis not present

## 2018-01-09 DIAGNOSIS — I712 Thoracic aortic aneurysm, without rupture: Secondary | ICD-10-CM | POA: Diagnosis not present

## 2018-01-09 DIAGNOSIS — I714 Abdominal aortic aneurysm, without rupture, unspecified: Secondary | ICD-10-CM

## 2018-01-09 DIAGNOSIS — I7121 Aneurysm of the ascending aorta, without rupture: Secondary | ICD-10-CM | POA: Insufficient documentation

## 2018-01-09 DIAGNOSIS — Z950 Presence of cardiac pacemaker: Secondary | ICD-10-CM | POA: Diagnosis not present

## 2018-01-09 DIAGNOSIS — R011 Cardiac murmur, unspecified: Secondary | ICD-10-CM

## 2018-01-09 DIAGNOSIS — I482 Chronic atrial fibrillation: Secondary | ICD-10-CM

## 2018-01-09 DIAGNOSIS — I4821 Permanent atrial fibrillation: Secondary | ICD-10-CM

## 2018-01-09 DIAGNOSIS — I442 Atrioventricular block, complete: Secondary | ICD-10-CM | POA: Diagnosis not present

## 2018-01-09 DIAGNOSIS — I2510 Atherosclerotic heart disease of native coronary artery without angina pectoris: Secondary | ICD-10-CM | POA: Diagnosis not present

## 2018-01-09 NOTE — Patient Instructions (Signed)
Medication Instructions: Dr Sallyanne Kuster recommends that you continue on your current medications as directed. Please refer to the Current Medication list given to you today.  Labwork: NONE ORDERED  Testing/Procedures: 1. Echocardiogram - Your physician has requested that you have an echocardiogram. Echocardiography is a painless test that uses sound waves to create images of your heart. It provides your doctor with information about the size and shape of your heart and how well your heart's chambers and valves are working. This procedure takes approximately one hour. There are no restrictions for this procedure. >>This will be performed at our [facility] location [place], [place] [address] [phone]  Follow-up: Dr Sallyanne Kuster recommends that you schedule a follow-up appointment in 6 months. You will receive a reminder letter in the mail two months in advance. If you don't receive a letter, please call our office to schedule the follow-up appointment.  If you need a refill on your cardiac medications before your next appointment, please call your pharmacy.

## 2018-01-09 NOTE — Progress Notes (Signed)
Cardiology Office Note    Date:  01/09/2018   ID:  [name] STMARTIN, DOB May 22, 1926, MRN [id number]  PCP:  [name] Manes, MD  Cardiologist:   [name] Klein, MD   Chief Complaint  Patient presents with  . Follow-up  Pacemaker check, CAD, palpitations  History of Present Illness:  [name] [name] is a 84 y.o. male with complete heart block (pacemaker dependent), permanent atrial fibrillation, CAD s/p CABG, Alzheimer's dementia, small ascending thoracic aortic aneurysm (4 cm), small abdominal aortic aneurysm (2.8 cm), hypertension and hyperlipidemia here for follow-up on his pacemaker (2009, St. Jude Zephyr XL DR, programmed VVIR).  Cardiac point of view he is asymptomatic.  He denies angina or dyspnea, but is quite sedentary.  He has not had leg edema, orthopnea, PND, claudication or any new focal neurological complaints.  He has not had any interval falls and denies any bleeding complications.  In the last 12 months he has had only had one serious fall.  (That's the second fall in the last 3 years).  Thankfully he did not have any bleeding complications.  Interrogation of his pacemaker shows normal device function.  He has atrial fibrillation with high grade AV block.  He has 98% ventricular pacing.  We did not check to see if he has any underlying escape rhythm today, his idioventricular escape has been inconsistently they are about 35 beats per the past.  No episodes of high ventricular rate have been recorded.  Estimated generator longevity is 3.75-4.75 years  Dementia has progressed gradually, but he remains well compensated with prompts from his wife and he appears to have good quality of life.   He had bypass surgery in 2010 [name] [name], LIMA to LAD, SVG to distal circumflex, SVG to distal RCA) and has not had any signs or symptoms of coronary disease since that time.  He had a surgical maze ablation and ligation of left atrial appendage at that time. His nuclear stress test in August  2014 showed a dense inferolateral scar. Left ventricular ejection fraction was around 50%.   Past Medical History:  Diagnosis Date  . CAD (coronary artery disease)   . Dementia   . High cholesterol   . HTN (hypertension)   . Memory loss   . Mild dementia     Past Surgical History:  Procedure Laterality Date  . angiolplasty    . APPENDECTOMY    . INSERT / REPLACE / [place]    . PILONIDAL CYST EXCISION    . rotator cuff surgery  1985  . TONSILLECTOMY    . triple heart bypass      Current Medications: Outpatient Medications Prior to Visit  Medication Sig Dispense Refill  . atorvastatin (LIPITOR) 40 MG tablet Take 40 mg by mouth every evening.     . Cholecalciferol (VITAMIN D) 2000 UNITS CAPS Take 2,000 Units daily by mouth.     . donepezil (ARICEPT) 5 MG tablet Take 1 tablet (5 mg total) by mouth 2 (two) times daily. 180 tablet 3  . finasteride (PROSCAR) 5 MG tablet Take 5 mg by mouth every evening.     . ranitidine (ZANTAC) 75 MG tablet Take 75 mg by mouth 2 (two) times daily.    [name] warfarin (COUMADIN) 3 MG tablet Take 3 mg by mouth every evening.      No facility-administered medications prior to visit.      Allergies:   Amiodarone and Antihistamines, diphenhydramine-type   Social  History   Socioeconomic History  . Marital status: Married    Spouse name: [name] [name]  . Number of children: 6  . Years of education: college  . Highest education level: Not on file  Occupational History  . Not on file  Social Needs  . Financial resource strain: Not on file  . Food insecurity:    Worry: Not on file    Inability: Not on file  . Transportation needs:    Medical: Not on file    Non-medical: Not on file  Tobacco Use  . Smoking status: Former Smoker    Last attempt to quit: 07/02/1974    Years since quitting: 43.5  . Smokeless tobacco: Never Used  Substance and Sexual Activity  . Alcohol use: Yes    Comment: 2-3 "stiff" drinks per day  . Drug  use: No  . Sexual activity: Not on file  Lifestyle  . Physical activity:    Days per week: Not on file    Minutes per session: Not on file  . Stress: Not on file  Relationships  . Social connections:    Talks on phone: Not on file    Gets together: Not on file    Attends religious service: Not on file    Active member of club or organization: Not on file    Attends meetings of clubs or organizations: Not on file    Relationship status: Not on file  Other Topics Concern  . Not on file  Social History Narrative   Patient is married [name] [name]) and lives at home with his wife.   Patient has a college education.   Patient is a retired Materials engineer.   Patient does not drink any caffeine.   Patient has six children.     Family History:  The patient's family history includes COPD in his sister; Cervical cancer in his unknown relative; Dementia in his brother; Stroke in his father.   ROS:   Please see the history of present illness.    ROS all other systems are reviewed and are negative.  Some limitations due to memory problems.   PHYSICAL EXAM:   VS:  BP 122/72   Pulse 60   Ht 5\' 10"  (1.778 m)   Wt 156 lb (70.8 kg)   BMI 22.38 kg/m     General: Alert, oriented x3, no distress, very lean, appears a little frail Head: no evidence of trauma, PERRL, EOMI, no exophtalmos or lid lag, no myxedema, no xanthelasma; normal ears, nose and oropharynx Neck: normal jugular venous pulsations and no hepatojugular reflux; brisk carotid pulses without delay and no carotid bruits Chest: clear to auscultation, no signs of consolidation by percussion or palpation, normal fremitus, symmetrical and full respiratory excursions.  Healthy subclavian pacemaker site Cardiovascular: normal position and quality of the apical impulse, regular rhythm, normal first and paradoxically split second heart sounds, 2/6 very musical early to mid peaking systolic ejection murmur heard throughout the  precordium, no diastolic murmurs, rubs or gallops Abdomen: no tenderness or distention, no masses by palpation, no abnormal pulsatility or arterial bruits, normal bowel sounds, no hepatosplenomegaly Extremities: no clubbing, cyanosis or edema; 2+ radial, ulnar and brachial pulses bilaterally; 2+ right femoral, posterior tibial and dorsalis pedis pulses; 2+ left femoral, posterior tibial and dorsalis pedis pulses; no subclavian or femoral bruits Neurological: grossly nonfocal Psych: Normal mood and affect    Wt Readings from Last 3 Encounters:  01/09/18 156 lb (70.8 kg)  08/09/17 160 lb (  72.6 kg)  07/15/17 160 lb (72.6 kg)      Studies/Labs Reviewed:   EKG:  EKG is ordered today.  It shows background atrial fibrillation percent ventricular pacing  BMET    Component Value Date/Time   NA 137 05/08/2017 1318   K 4.2 05/08/2017 1318   CL 100 (L) 05/08/2017 1318   CO2 29 05/08/2017 1318   GLUCOSE 92 05/08/2017 1318   BUN 13 05/08/2017 1318   CREATININE 1.03 05/08/2017 1318   CALCIUM 9.0 05/08/2017 1318   GFRNONAA >60 05/08/2017 1318   GFRAA >60 05/08/2017 1318    ASSESSMENT:    1. Third degree AV block ([place])   2. Permanent atrial fibrillation ([place])   3. Long term current use of anticoagulant   4. Coronary artery disease involving native coronary artery of native heart without angina pectoris   5. Left ventricular systolic dysfunction   6. Pacemaker   7. Ascending aortic aneurysm ([place])   8. AAA (abdominal aortic aneurysm) without rupture (HCC)   9. Cardiac murmur      PLAN:  In order of problems listed above:  1. 2nd/3rd deg AV block: Occasionally has an underlying escape rhythm, but this is not consistently present.  He should be considered device dependent. 2. AFib: Slow ventricular response without medications. CHADSVasc 4 (age 39, CAD, LV dysf).  He has not had recent falls or injuries.  He does have a history of appendage ligation, so stopping his warfarin would be  reasonable if he has complications. 3. Warfarin:  without bleeding complications.  Falls in November 2017 and January 2018, both mechanical. 4. CAD s/p CABG: Asymptomatic, no angina pectoris. On high-dose statin, labs followed by [name] 5. Borderline LV dysfunction: He has no have clinical heart failure.  With his relatively low blood pressure and history of falls, I would not put him on medications for LV dysfunction.  Also think it is wise to keep his medicine list as simple as possible and avoid polypharmacy. 6. PM: Normal pacemaker function.  Not amenable to remote monitoring, will bring him back to the office in 6 months. 7. Ascending aortic aneurysm and abdominal aortic aneurysm: These have been stable in size and minor abnormalities (4 cm and 2.8 cm respectively).  I would not recommend routine monitoring his age. 8. Murmur: It sounds like he may have developed aortic stenosis.  We will check an echocardiogram.   Medication Adjustments/Labs and Tests Ordered: Current medicines are reviewed at length with the patient today.  Concerns regarding medicines are outlined above.  Medication changes, Labs and Tests ordered today are listed in the Patient Instructions below. Patient Instructions  Medication Instructions: Dr Sallyanne Kuster recommends that you continue on your current medications as directed. Please refer to the Current Medication list given to you today.  Labwork: NONE ORDERED  Testing/Procedures: 1. Echocardiogram - Your physician has requested that you have an echocardiogram. Echocardiography is a painless test that uses sound waves to create images of your heart. It provides your doctor with information about the size and shape of your heart and how well your heart's chambers and valves are working. This procedure takes approximately one hour. There are no restrictions for this procedure. >>This will be performed at our [facility] location [place], [place] [address] [phone]  Follow-up: Dr Sallyanne Kuster recommends that you schedule a follow-up appointment in 6 months. You will receive a reminder letter in the mail two months in advance. If you don't  receive a letter, please call our office to schedule the follow-up appointment.  If you need a refill on your cardiac medications before your next appointment, please call your pharmacy.    Signed, [name] Klein, MD  01/09/2018 2:03 PM    [facility] [address], [place], [place]  [address] Phone: [phone]; Fax: [phone]

## 2018-01-21 ENCOUNTER — Ambulatory Visit (HOSPITAL_COMMUNITY): Payer: Medicare Other | Attending: Cardiology

## 2018-01-21 ENCOUNTER — Other Ambulatory Visit: Payer: Self-pay

## 2018-01-21 DIAGNOSIS — I081 Rheumatic disorders of both mitral and tricuspid valves: Secondary | ICD-10-CM | POA: Diagnosis not present

## 2018-01-21 DIAGNOSIS — Z951 Presence of aortocoronary bypass graft: Secondary | ICD-10-CM | POA: Diagnosis not present

## 2018-01-21 DIAGNOSIS — I700 Atherosclerosis of aorta: Secondary | ICD-10-CM | POA: Diagnosis not present

## 2018-01-21 DIAGNOSIS — R011 Cardiac murmur, unspecified: Secondary | ICD-10-CM | POA: Insufficient documentation

## 2018-01-29 DIAGNOSIS — I481 Persistent atrial fibrillation: Secondary | ICD-10-CM | POA: Diagnosis not present

## 2018-02-20 LAB — CUP PACEART INCLINIC DEVICE CHECK
Date Time Interrogation Session: 20190822144011
Implantable Lead Implant Date: 20010830
Implantable Lead Location: 753859
Lead Channel Setting Pacing Pulse Width: 0.5 ms
MDC IDC LEAD IMPLANT DT: 20010830
MDC IDC LEAD LOCATION: 753860
MDC IDC PG IMPLANT DT: 20090722
MDC IDC PG SERIAL: 1216574
MDC IDC SET LEADCHNL RV SENSING SENSITIVITY: 2 mV
Pulse Gen Model: 5826

## 2018-02-26 DIAGNOSIS — I481 Persistent atrial fibrillation: Secondary | ICD-10-CM | POA: Diagnosis not present

## 2018-03-26 DIAGNOSIS — I481 Persistent atrial fibrillation: Secondary | ICD-10-CM | POA: Diagnosis not present

## 2018-04-23 DIAGNOSIS — I4819 Other persistent atrial fibrillation: Secondary | ICD-10-CM | POA: Diagnosis not present

## 2018-04-25 DIAGNOSIS — E7800 Pure hypercholesterolemia, unspecified: Secondary | ICD-10-CM | POA: Diagnosis not present

## 2018-04-25 DIAGNOSIS — G301 Alzheimer's disease with late onset: Secondary | ICD-10-CM | POA: Diagnosis not present

## 2018-04-25 DIAGNOSIS — I10 Essential (primary) hypertension: Secondary | ICD-10-CM | POA: Diagnosis not present

## 2018-04-25 DIAGNOSIS — Z23 Encounter for immunization: Secondary | ICD-10-CM | POA: Diagnosis not present

## 2018-04-25 DIAGNOSIS — I4819 Other persistent atrial fibrillation: Secondary | ICD-10-CM | POA: Diagnosis not present

## 2018-05-14 DIAGNOSIS — I4819 Other persistent atrial fibrillation: Secondary | ICD-10-CM | POA: Diagnosis not present

## 2018-05-27 DIAGNOSIS — I4819 Other persistent atrial fibrillation: Secondary | ICD-10-CM | POA: Diagnosis not present

## 2018-06-05 ENCOUNTER — Emergency Department (HOSPITAL_COMMUNITY): Payer: Medicare Other

## 2018-06-05 ENCOUNTER — Encounter (HOSPITAL_COMMUNITY): Payer: Self-pay | Admitting: Emergency Medicine

## 2018-06-05 ENCOUNTER — Inpatient Hospital Stay (HOSPITAL_COMMUNITY)
Admission: EM | Admit: 2018-06-05 | Discharge: 2018-06-07 | DRG: 378 | Disposition: A | Discharge status: Home Health | Payer: Medicare Other | Attending: Internal Medicine | Admitting: Internal Medicine

## 2018-06-05 DIAGNOSIS — E7800 Pure hypercholesterolemia, unspecified: Secondary | ICD-10-CM | POA: Diagnosis present

## 2018-06-05 DIAGNOSIS — Z7901 Long term (current) use of anticoagulants: Secondary | ICD-10-CM

## 2018-06-05 DIAGNOSIS — S199XXA Unspecified injury of neck, initial encounter: Secondary | ICD-10-CM | POA: Diagnosis not present

## 2018-06-05 DIAGNOSIS — I714 Abdominal aortic aneurysm, without rupture: Secondary | ICD-10-CM | POA: Diagnosis present

## 2018-06-05 DIAGNOSIS — R410 Disorientation, unspecified: Secondary | ICD-10-CM | POA: Diagnosis not present

## 2018-06-05 DIAGNOSIS — G309 Alzheimer's disease, unspecified: Secondary | ICD-10-CM | POA: Diagnosis present

## 2018-06-05 DIAGNOSIS — D62 Acute posthemorrhagic anemia: Secondary | ICD-10-CM | POA: Diagnosis present

## 2018-06-05 DIAGNOSIS — F0281 Dementia in other diseases classified elsewhere with behavioral disturbance: Secondary | ICD-10-CM | POA: Diagnosis present

## 2018-06-05 DIAGNOSIS — Z87891 Personal history of nicotine dependence: Secondary | ICD-10-CM

## 2018-06-05 DIAGNOSIS — K922 Gastrointestinal hemorrhage, unspecified: Secondary | ICD-10-CM | POA: Diagnosis present

## 2018-06-05 DIAGNOSIS — I4891 Unspecified atrial fibrillation: Secondary | ICD-10-CM | POA: Diagnosis not present

## 2018-06-05 DIAGNOSIS — I4821 Permanent atrial fibrillation: Secondary | ICD-10-CM | POA: Diagnosis present

## 2018-06-05 DIAGNOSIS — F0390 Unspecified dementia without behavioral disturbance: Secondary | ICD-10-CM

## 2018-06-05 DIAGNOSIS — Z825 Family history of asthma and other chronic lower respiratory diseases: Secondary | ICD-10-CM

## 2018-06-05 DIAGNOSIS — Z823 Family history of stroke: Secondary | ICD-10-CM

## 2018-06-05 DIAGNOSIS — K5731 Diverticulosis of large intestine without perforation or abscess with bleeding: Principal | ICD-10-CM | POA: Diagnosis present

## 2018-06-05 DIAGNOSIS — E785 Hyperlipidemia, unspecified: Secondary | ICD-10-CM | POA: Diagnosis present

## 2018-06-05 DIAGNOSIS — R791 Abnormal coagulation profile: Secondary | ICD-10-CM | POA: Diagnosis present

## 2018-06-05 DIAGNOSIS — Z79899 Other long term (current) drug therapy: Secondary | ICD-10-CM

## 2018-06-05 DIAGNOSIS — R404 Transient alteration of awareness: Secondary | ICD-10-CM | POA: Diagnosis not present

## 2018-06-05 DIAGNOSIS — Z951 Presence of aortocoronary bypass graft: Secondary | ICD-10-CM

## 2018-06-05 DIAGNOSIS — I252 Old myocardial infarction: Secondary | ICD-10-CM

## 2018-06-05 DIAGNOSIS — R55 Syncope and collapse: Secondary | ICD-10-CM | POA: Diagnosis not present

## 2018-06-05 DIAGNOSIS — Z66 Do not resuscitate: Secondary | ICD-10-CM | POA: Diagnosis present

## 2018-06-05 DIAGNOSIS — R680 Hypothermia, not associated with low environmental temperature: Secondary | ICD-10-CM | POA: Diagnosis present

## 2018-06-05 DIAGNOSIS — Z888 Allergy status to other drugs, medicaments and biological substances status: Secondary | ICD-10-CM

## 2018-06-05 DIAGNOSIS — I2510 Atherosclerotic heart disease of native coronary artery without angina pectoris: Secondary | ICD-10-CM | POA: Diagnosis present

## 2018-06-05 DIAGNOSIS — K5791 Diverticulosis of intestine, part unspecified, without perforation or abscess with bleeding: Secondary | ICD-10-CM | POA: Diagnosis not present

## 2018-06-05 DIAGNOSIS — G301 Alzheimer's disease with late onset: Secondary | ICD-10-CM

## 2018-06-05 DIAGNOSIS — I959 Hypotension, unspecified: Secondary | ICD-10-CM | POA: Diagnosis not present

## 2018-06-05 DIAGNOSIS — W19XXXA Unspecified fall, initial encounter: Secondary | ICD-10-CM | POA: Diagnosis not present

## 2018-06-05 DIAGNOSIS — I110 Hypertensive heart disease with heart failure: Secondary | ICD-10-CM | POA: Diagnosis present

## 2018-06-05 DIAGNOSIS — I712 Thoracic aortic aneurysm, without rupture: Secondary | ICD-10-CM | POA: Diagnosis present

## 2018-06-05 DIAGNOSIS — R109 Unspecified abdominal pain: Secondary | ICD-10-CM | POA: Diagnosis not present

## 2018-06-05 DIAGNOSIS — F0391 Unspecified dementia with behavioral disturbance: Secondary | ICD-10-CM | POA: Diagnosis present

## 2018-06-05 DIAGNOSIS — K5790 Diverticulosis of intestine, part unspecified, without perforation or abscess without bleeding: Secondary | ICD-10-CM | POA: Diagnosis not present

## 2018-06-05 DIAGNOSIS — I5022 Chronic systolic (congestive) heart failure: Secondary | ICD-10-CM | POA: Diagnosis present

## 2018-06-05 DIAGNOSIS — E1165 Type 2 diabetes mellitus with hyperglycemia: Secondary | ICD-10-CM | POA: Diagnosis not present

## 2018-06-05 DIAGNOSIS — S0990XA Unspecified injury of head, initial encounter: Secondary | ICD-10-CM | POA: Diagnosis not present

## 2018-06-05 DIAGNOSIS — W1830XA Fall on same level, unspecified, initial encounter: Secondary | ICD-10-CM | POA: Diagnosis present

## 2018-06-05 DIAGNOSIS — N400 Enlarged prostate without lower urinary tract symptoms: Secondary | ICD-10-CM | POA: Diagnosis present

## 2018-06-05 DIAGNOSIS — R58 Hemorrhage, not elsewhere classified: Secondary | ICD-10-CM | POA: Diagnosis not present

## 2018-06-05 DIAGNOSIS — I10 Essential (primary) hypertension: Secondary | ICD-10-CM | POA: Diagnosis present

## 2018-06-05 DIAGNOSIS — I441 Atrioventricular block, second degree: Secondary | ICD-10-CM | POA: Diagnosis present

## 2018-06-05 DIAGNOSIS — R001 Bradycardia, unspecified: Secondary | ICD-10-CM | POA: Diagnosis present

## 2018-06-05 DIAGNOSIS — F03918 Unspecified dementia, unspecified severity, with other behavioral disturbance: Secondary | ICD-10-CM | POA: Diagnosis present

## 2018-06-05 DIAGNOSIS — Z950 Presence of cardiac pacemaker: Secondary | ICD-10-CM

## 2018-06-05 DIAGNOSIS — K921 Melena: Secondary | ICD-10-CM | POA: Diagnosis not present

## 2018-06-05 DIAGNOSIS — F0280 Dementia in other diseases classified elsewhere without behavioral disturbance: Secondary | ICD-10-CM | POA: Diagnosis present

## 2018-06-05 DIAGNOSIS — I4819 Other persistent atrial fibrillation: Secondary | ICD-10-CM

## 2018-06-05 DIAGNOSIS — K7460 Unspecified cirrhosis of liver: Secondary | ICD-10-CM | POA: Diagnosis not present

## 2018-06-05 DIAGNOSIS — K5730 Diverticulosis of large intestine without perforation or abscess without bleeding: Secondary | ICD-10-CM | POA: Diagnosis not present

## 2018-06-05 DIAGNOSIS — I480 Paroxysmal atrial fibrillation: Secondary | ICD-10-CM | POA: Diagnosis not present

## 2018-06-05 HISTORY — DX: Diverticulosis of intestine, part unspecified, without perforation or abscess without bleeding: K57.90

## 2018-06-05 HISTORY — DX: Gastrointestinal hemorrhage, unspecified: K92.2

## 2018-06-05 LAB — CBC WITH DIFFERENTIAL/PLATELET
ABS IMMATURE GRANULOCYTES: 0.07 10*3/uL (ref 0.00–0.07)
BASOS ABS: 0.1 10*3/uL (ref 0.0–0.1)
Basophils Relative: 0 %
EOS ABS: 0.2 10*3/uL (ref 0.0–0.5)
Eosinophils Relative: 2 %
HEMATOCRIT: 34.7 % — AB (ref 39.0–52.0)
HEMOGLOBIN: 10.6 g/dL — AB (ref 13.0–17.0)
IMMATURE GRANULOCYTES: 1 %
LYMPHS ABS: 3.1 10*3/uL (ref 0.7–4.0)
Lymphocytes Relative: 24 %
MCH: 31.2 pg (ref 26.0–34.0)
MCHC: 30.5 g/dL (ref 30.0–36.0)
MCV: 102.1 fL — ABNORMAL HIGH (ref 80.0–100.0)
Monocytes Absolute: 1.2 10*3/uL — ABNORMAL HIGH (ref 0.1–1.0)
Monocytes Relative: 9 %
NEUTROS PCT: 64 %
NRBC: 0 % (ref 0.0–0.2)
Neutro Abs: 8.2 10*3/uL — ABNORMAL HIGH (ref 1.7–7.7)
Platelets: 216 10*3/uL (ref 150–400)
RBC: 3.4 MIL/uL — ABNORMAL LOW (ref 4.22–5.81)
RDW: 13.8 % (ref 11.5–15.5)
WBC: 12.8 10*3/uL — ABNORMAL HIGH (ref 4.0–10.5)

## 2018-06-05 LAB — COMPREHENSIVE METABOLIC PANEL
ALBUMIN: 2.6 g/dL — AB (ref 3.5–5.0)
ALK PHOS: 94 U/L (ref 38–126)
ALT: 15 U/L (ref 0–44)
ANION GAP: 15 (ref 5–15)
AST: 30 U/L (ref 15–41)
BUN: 26 mg/dL — ABNORMAL HIGH (ref 8–23)
CALCIUM: 8.8 mg/dL — AB (ref 8.9–10.3)
CO2: 22 mmol/L (ref 22–32)
Chloride: 103 mmol/L (ref 98–111)
Creatinine, Ser: 1.43 mg/dL — ABNORMAL HIGH (ref 0.61–1.24)
GFR, EST AFRICAN AMERICAN: 50 mL/min — AB (ref >60)
GFR, EST NON AFRICAN AMERICAN: 43 mL/min — AB (ref >60)
Glucose, Bld: 209 mg/dL — ABNORMAL HIGH (ref 70–99)
Potassium: 4 mmol/L (ref 3.5–5.1)
Sodium: 140 mmol/L (ref 135–145)
Total Bilirubin: 0.6 mg/dL (ref 0.3–1.2)
Total Protein: 6.9 g/dL (ref 6.5–8.1)

## 2018-06-05 LAB — CBC
HCT: 26.4 % — ABNORMAL LOW (ref 39.0–52.0)
HCT: 27.5 % — ABNORMAL LOW (ref 39.0–52.0)
HCT: 24.8 % — ABNORMAL LOW (ref 39.0–52.0)
HEMOGLOBIN: 8 g/dL — AB (ref 13.0–17.0)
Hemoglobin: 8.7 g/dL — ABNORMAL LOW (ref 13.0–17.0)
Hemoglobin: 7.7 g/dL — ABNORMAL LOW (ref 13.0–17.0)
MCH: 31.1 pg (ref 26.0–34.0)
MCH: 31.4 pg (ref 26.0–34.0)
MCH: 30.8 pg (ref 26.0–34.0)
MCHC: 30.3 g/dL (ref 30.0–36.0)
MCHC: 31.6 g/dL (ref 30.0–36.0)
MCHC: 31 g/dL (ref 30.0–36.0)
MCV: 102.7 fL — ABNORMAL HIGH (ref 80.0–100.0)
MCV: 99.3 fL (ref 80.0–100.0)
MCV: 99.2 fL (ref 80.0–100.0)
NRBC: 0 % (ref 0.0–0.2)
NRBC: 0 % (ref 0.0–0.2)
NRBC: 0 % (ref 0.0–0.2)
Platelets: 174 10*3/uL (ref 150–400)
Platelets: 165 10*3/uL (ref 150–400)
Platelets: 155 10*3/uL (ref 150–400)
RBC: 2.57 MIL/uL — ABNORMAL LOW (ref 4.22–5.81)
RBC: 2.77 MIL/uL — ABNORMAL LOW (ref 4.22–5.81)
RBC: 2.5 MIL/uL — ABNORMAL LOW (ref 4.22–5.81)
RDW: 13.9 % (ref 11.5–15.5)
RDW: 13.8 % (ref 11.5–15.5)
RDW: 13.9 % (ref 11.5–15.5)
WBC: 9 10*3/uL (ref 4.0–10.5)
WBC: 7.6 10*3/uL (ref 4.0–10.5)
WBC: 6.1 10*3/uL (ref 4.0–10.5)

## 2018-06-05 LAB — I-STAT CHEM 8, ED
BUN: 27 mg/dL — AB (ref 8–23)
Calcium, Ion: 1.11 mmol/L — ABNORMAL LOW (ref 1.15–1.40)
Chloride: 106 mmol/L (ref 98–111)
Creatinine, Ser: 1.3 mg/dL — ABNORMAL HIGH (ref 0.61–1.24)
GLUCOSE: 202 mg/dL — AB (ref 70–99)
HCT: 36 % — ABNORMAL LOW (ref 39.0–52.0)
HEMOGLOBIN: 12.2 g/dL — AB (ref 13.0–17.0)
Potassium: 4 mmol/L (ref 3.5–5.1)
Sodium: 141 mmol/L (ref 135–145)
TCO2: 24 mmol/L (ref 22–32)

## 2018-06-05 LAB — I-STAT CG4 LACTIC ACID, ED
LACTIC ACID, VENOUS: 1.1 mmol/L (ref 0.5–1.9)
Lactic Acid, Venous: 6.37 mmol/L (ref 0.5–1.9)

## 2018-06-05 LAB — I-STAT TROPONIN, ED: Troponin i, poc: 0.03 ng/mL (ref 0.00–0.08)

## 2018-06-05 LAB — PREPARE RBC (CROSSMATCH)

## 2018-06-05 LAB — PROTIME-INR
INR: 2.71
INR: 1.51
PROTHROMBIN TIME: 28.4 s — AB (ref 11.4–15.2)
Prothrombin Time: 18 seconds — ABNORMAL HIGH (ref 11.4–15.2)

## 2018-06-05 LAB — POC OCCULT BLOOD, ED: FECAL OCCULT BLD: POSITIVE — AB

## 2018-06-05 MED ORDER — PANTOPRAZOLE SODIUM 40 MG IV SOLR
40.0000 mg | Freq: Two times a day (BID) | INTRAVENOUS | Status: DC
  Administered 2018-06-05 – 2018-06-06 (×3): 40 mg via INTRAVENOUS
  Filled 2018-06-05 (×3): qty 40

## 2018-06-05 MED ORDER — PROTHROMBIN COMPLEX CONC HUMAN 500 UNITS IV KIT: 1500.0000 [IU] | PACK | Freq: Once | Status: DC

## 2018-06-05 MED ORDER — IOPAMIDOL (ISOVUE-370) INJECTION 76%
100.0000 mL | Freq: Once | INTRAVENOUS | Status: AC | PRN
  Administered 2018-06-05: 80 mL via INTRAVENOUS

## 2018-06-05 MED ORDER — SODIUM CHLORIDE 0.9% IV SOLUTION: Freq: Once | INTRAVENOUS | Status: DC

## 2018-06-05 MED ORDER — VANCOMYCIN HCL IN DEXTROSE 1-5 GM/200ML-% IV SOLN: 1000.0000 mg | Freq: Once | INTRAVENOUS | Status: DC

## 2018-06-05 MED ORDER — SODIUM CHLORIDE 0.9 % IV BOLUS (SEPSIS)
500.0000 mL | Freq: Once | INTRAVENOUS | Status: AC
  Administered 2018-06-05: 500 mL via INTRAVENOUS

## 2018-06-05 MED ORDER — ONDANSETRON HCL 4 MG PO TABS: 4.0000 mg | ORAL_TABLET | Freq: Four times a day (QID) | ORAL | Status: DC | PRN

## 2018-06-05 MED ORDER — SODIUM CHLORIDE 0.9 % IV BOLUS (SEPSIS)
1000.0000 mL | Freq: Once | INTRAVENOUS | Status: AC
  Administered 2018-06-05: 1000 mL via INTRAVENOUS

## 2018-06-05 MED ORDER — VITAMIN K1 10 MG/ML IJ SOLN
10.0000 mg | Freq: Once | INTRAVENOUS | Status: AC
  Administered 2018-06-05: 10 mg via INTRAVENOUS
  Filled 2018-06-05: qty 1

## 2018-06-05 MED ORDER — METRONIDAZOLE IN NACL 5-0.79 MG/ML-% IV SOLN
500.0000 mg | Freq: Three times a day (TID) | INTRAVENOUS | Status: DC
  Administered 2018-06-05: 500 mg via INTRAVENOUS
  Filled 2018-06-05: qty 100

## 2018-06-05 MED ORDER — ATORVASTATIN CALCIUM 40 MG PO TABS
40.0000 mg | ORAL_TABLET | Freq: Every evening | ORAL | Status: DC
  Administered 2018-06-05 – 2018-06-06 (×2): 40 mg via ORAL
  Filled 2018-06-05 (×2): qty 1

## 2018-06-05 MED ORDER — DONEPEZIL HCL 5 MG PO TABS
5.0000 mg | ORAL_TABLET | Freq: Two times a day (BID) | ORAL | Status: DC
  Administered 2018-06-05 – 2018-06-07 (×4): 5 mg via ORAL
  Filled 2018-06-05 (×4): qty 1

## 2018-06-05 MED ORDER — SODIUM CHLORIDE 0.9% FLUSH
3.0000 mL | Freq: Two times a day (BID) | INTRAVENOUS | Status: DC
  Administered 2018-06-07: 3 mL via INTRAVENOUS

## 2018-06-05 MED ORDER — VANCOMYCIN HCL 10 G IV SOLR
1500.0000 mg | Freq: Once | INTRAVENOUS | Status: AC
  Administered 2018-06-05: 1500 mg via INTRAVENOUS
  Filled 2018-06-05: qty 1500

## 2018-06-05 MED ORDER — ACETAMINOPHEN 650 MG RE SUPP: 650.0000 mg | Freq: Four times a day (QID) | RECTAL | Status: DC | PRN

## 2018-06-05 MED ORDER — SODIUM CHLORIDE 0.9 % IV SOLN
2.0000 g | Freq: Once | INTRAVENOUS | Status: AC
  Administered 2018-06-05: 2 g via INTRAVENOUS
  Filled 2018-06-05: qty 2

## 2018-06-05 MED ORDER — ACETAMINOPHEN 325 MG PO TABS: 650.0000 mg | ORAL_TABLET | Freq: Four times a day (QID) | ORAL | Status: DC | PRN

## 2018-06-05 MED ORDER — FINASTERIDE 5 MG PO TABS
5.0000 mg | ORAL_TABLET | Freq: Every evening | ORAL | Status: DC
  Administered 2018-06-05 – 2018-06-06 (×2): 5 mg via ORAL
  Filled 2018-06-05 (×2): qty 1

## 2018-06-05 MED ORDER — SODIUM CHLORIDE 0.9 % IV SOLN
INTRAVENOUS | Status: DC
  Administered 2018-06-05: 900 mL via INTRAVENOUS
  Administered 2018-06-06: 03:00:00 via INTRAVENOUS

## 2018-06-05 MED ORDER — ONDANSETRON HCL 4 MG/2ML IJ SOLN: 4.0000 mg | Freq: Four times a day (QID) | INTRAMUSCULAR | Status: DC | PRN

## 2018-06-05 NOTE — ED Notes (Signed)
Rectal temp 95. Pt placed on [facility]

## 2018-06-05 NOTE — Consult Note (Signed)
NAME:  [name] [name], MRN:  [id number], DOB:  January 07, 1926, LOS: 0 ADMISSION DATE:  06/05/2018, CONSULTATION DATE: 12/5 REFERRING MD: Dr. Stark Jock, CHIEF COMPLAINT: GIB  Brief History   84 year old male who presented to [facility] on 12/5 with reports of melena that began on the evening prior.  Initially hypotensive but responded to IVF.  Patient has known AF on Coumadin (INR 2.71 on admit) which was reversed in the ER with FFP x2 and vitamin K.  History of present illness   84 year old male, with PMH of dementia, CAD s/p CABG, pacemaker, diverticulitis with prior bleed in 2011, AF on Coumadin who presented to [facility] on 12/5 with reports of melena that began on the evening prior.  Patient's wife reports that he called her to the restroom around 0200 and she saw that he had a large dark stool that was concerning for blood prompting ER evaluation.  Initially hypotensive on arrival to ER but responded to IVF.  Patient has known AF on Coumadin (INR 2.71 on admit) which was reversed in the ER with FFP x2 and vitamin K.  Initial labs-NA 141, K4.0, glucose 202, BUN 27, creatinine 1.3, lactic acid 6.37, WBC 9, hemoglobin 8 and platelets 174.  Repeat lactic acid cleared to 1.1 after fluid resuscitation.  PCCM consulted for evaluation given hypotension and concern for GI bleed.  Patient denies fevers/chills, nausea, vomiting, hematemesis, hemoptysis, syncope/presyncope and chest pain.  His wife reports he has not noted any unusual symptoms in the days preceding admission.  He remains independent of ADLs and walks without assistance prior to presentation.  Past Medical History  CAD status post CABG Hypertension High cholesterol Diverticulitis with prior diverticular bleed in 2011 Dementia A. fib on Coumadin  Significant Hospital Events   12/5 Admit with concern for GIB  Consults:  GI  PCCM  Procedures:    Significant Diagnostic Tests:    Micro Data:    Antimicrobials:  Metronidazole 12/5 >>  Interim  history/subjective:  Patient denies acute complaints  Objective   Blood pressure 92/63, pulse 69, temperature 97.8 F (36.6 C), temperature source Oral, resp. rate 17, height 6' (1.829 m), weight 77.1 kg, SpO2 96 %.        Intake/Output Summary (Last 24 hours) at 06/05/2018 0934 Last data filed at 06/05/2018 0813 Gross per 24 hour  Intake 4649.16 ml  Output -  Net 4649.16 ml   Filed Weights   06/05/18 0322  Weight: 77.1 kg    Examination: General: Frail elderly male lying in bed in no acute distress HEENT: MM pale, moist.  Pupils =/R, no JVD Neuro: Awake/alert, situationally oriented and is appropriate in response, does appear to be aware that he has memory issues and asks his wife for assistance with answers to questions CV: s1s2 rrr, no m/r/g PULM: even/non-labored, lungs bilaterally clear CW:CBJS, non-tender, bsx4 active  Extremities: warm/dry, no edema  Skin: no rashes or lesions  Resolved Hospital Problem list     Assessment & Plan:   GIB -melena began 15/0200 -prior hx of diverticular bleed in 2011 P: Admit to SDU per [facility]  GI consult appreciated, ? If he will be a candidate for invasive procedures, defer to GI PPI BID  Defer abx to primary  NPO  Anemia  -blood loss, melena on presentation  -baseline Hgb ~12 P: Trend CBC  Transfuse 1 unit PRBC now Follow up H/H post transfusion   CAD s/p CABG (remote), HTN, HLD  P: Per primary   AF on Coumadin  P: Hold home coumadin  Complete 2 units FFP as ordered  Received Vitamin K in ER  Follow up INR post transfusion   Dementia  P: Aricept per primary   Best practice:  Diet: NPO Pain/Anxiety/Delirium protocol (if indicated): n/a VAP protocol (if indicated): n/a  DVT prophylaxis: SCD's  GI prophylaxis: PPI  Glucose control: n/a  Mobility: bedrest  Code Status:  Family Communication: Wife updated at bedside 12/5.  Disposition: SDU   Labs   CBC: Recent Labs  Lab 06/05/18 0339 06/05/18 0345  06/05/18 0741  WBC 12.8*  --  9.0  NEUTROABS 8.2*  --   --   HGB 10.6* 12.2* 8.0*  HCT 34.7* 36.0* 26.4*  MCV 102.1*  --  102.7*  PLT 216  --  650    Basic Metabolic Panel: Recent Labs  Lab 06/05/18 0339 06/05/18 0345  NA 140 141  K 4.0 4.0  CL 103 106  CO2 22  --   GLUCOSE 209* 202*  BUN 26* 27*  CREATININE 1.43* 1.30*  CALCIUM 8.8*  --    GFR: Estimated Creatinine Clearance: 41.2 mL/min (A) (by C-G formula based on SCr of 1.3 mg/dL (H)). Recent Labs  Lab 06/05/18 0339 06/05/18 0345 06/05/18 0741 06/05/18 0751  WBC 12.8*  --  9.0  --   LATICACIDVEN  --  6.37*  --  1.10    Liver Function Tests: Recent Labs  Lab 06/05/18 0339  AST 30  ALT 15  ALKPHOS 94  BILITOT 0.6  PROT 6.9  ALBUMIN 2.6*   No results for input(s): LIPASE, AMYLASE in the last 168 hours. No results for input(s): AMMONIA in the last 168 hours.  ABG    Component Value Date/Time   PHART 7.352 12/16/2008 0553   PCO2ART 39.7 12/16/2008 0553   PO2ART 61.0 (L) 12/16/2008 0553   HCO3 22.1 12/16/2008 0553   TCO2 24 06/05/2018 0345   ACIDBASEDEF 3.0 (H) 12/16/2008 0553   O2SAT 90.0 12/16/2008 0553     Coagulation Profile: Recent Labs  Lab 06/05/18 0339  INR 2.71    Cardiac Enzymes: No results for input(s): CKTOTAL, CKMB, CKMBINDEX, TROPONINI in the last 168 hours.  HbA1C: Hgb A1c MFr Bld  Date/Time Value Ref Range Status  12/13/2008 10:44 AM  4.6 - 6.1 % Final   5.5 (NOTE) The ADA recommends the following therapeutic goal for glycemic control related to Hgb A1c measurement: Goal of therapy: <6.5 Hgb A1c  Reference: American Diabetes Association: Clinical Practice Recommendations 2010, Diabetes Care, 2010, 33: (Suppl  1).    CBG: No results for input(s): GLUCAP in the last 168 hours.  Review of Systems:   Information obtained from staff, prior medical documentation and wife at bedside.  Patient has baseline dementia and is pleasantly confused.   Past Medical History  He,   has a past medical history of Atrial fibrillation ([place]), CAD (coronary artery disease), Dementia ([place]), High cholesterol, and HTN (hypertension).   Surgical History    Past Surgical History:  Procedure Laterality Date  . angiolplasty    . APPENDECTOMY    . INSERT / REPLACE / [place]    . PILONIDAL CYST EXCISION    . rotator cuff surgery  1985  . TONSILLECTOMY    . triple heart bypass       Social History   reports that he quit smoking about 43 years ago. He has never used smokeless tobacco. He reports that he drinks alcohol.  He reports that he does not use drugs.   Family History   His family history includes COPD in his sister; Cervical cancer in his unknown relative; Dementia in his brother; Stroke in his father.   Allergies Allergies  Allergen Reactions  . Amiodarone Other (See Comments)    Had a severe lung infection in 2003  . Antihistamines, Diphenhydramine-Type Other (See Comments)    Jittery     Home Medications  Prior to Admission medications   Medication Sig Start Date End Date Taking? Authorizing Provider  atorvastatin (LIPITOR) 40 MG tablet Take 40 mg by mouth every evening.    Yes [provider]  Cholecalciferol (VITAMIN D) 2000 UNITS CAPS Take 4,000 Units by mouth daily.    Yes [provider]  donepezil (ARICEPT) 5 MG tablet Take 1 tablet (5 mg total) by mouth 2 (two) times daily. 07/15/17  Yes Dohmeier, Asencion Partridge, MD  finasteride (PROSCAR) 5 MG tablet Take 5 mg by mouth every evening.    Yes [provider]  Multiple Vitamin (MULTIVITAMIN WITH MINERALS) TABS tablet Take 1 tablet by mouth daily.   Yes [provider]  warfarin (COUMADIN) 3 MG tablet Take 3-4 mg by mouth every evening. Alternating daily   Yes [provider]          Noe Gens, NP-C [facility] Pgr: [phone] or if no answer [phone] 06/05/2018, 9:34 AM

## 2018-06-05 NOTE — ED Notes (Signed)
BIB EMS from home, pt had witnessed syncopal episode while attempting to got to bathroom. Pt then had episode of bloody diarrhea, EMS approximating 233ml blood. Pt presents with skin tear to elbow and abrasion to L knee. Takes Coumadin. A/OX4.  Pt cleaned and linens changed upon arrival.

## 2018-06-05 NOTE — Progress Notes (Signed)
Pt admitted to [facility] room 18. A&O x 2-3. Tele monitor placed & verified. VS stable. Call bell within reach. Family at bedside. All questions/concerns addressed. Will continue to monitor.

## 2018-06-05 NOTE — ED Provider Notes (Addendum)
[facility] EMERGENCY DEPARTMENT Provider Note   CSN: [id number] Arrival date & time: 06/05/18  2094     History   Chief Complaint Chief Complaint  Patient presents with  . Loss of Consciousness  . GI Bleeding    HPI [name] [name] is a 84 y.o. male.  HPI   84 year old male with past medical history of A. fib, mild dementia, hypertension, here with loss of consciousness.  History limited due to dementia.  Per EMS report, the patient was in his usual state of health until he went to the restroom today.  He reportedly fell, and was found to have bloody stools.  Family immediately called 911.  On EMS arrival, patient hypotensive, confused but this is at baseline.  They note melanotic and maroon-colored stool.  He has not complained of any pain.  On my assessment, patient does not recall what happened.  Level 5 caveat invoked as remainder of history, ROS, and physical exam limited due to patient's dementia.   Past Medical History:  Diagnosis Date  . Atrial fibrillation ([place])   . CAD (coronary artery disease)   . Dementia ([place])   . Diverticulosis    diverticular bleed 2011  . High cholesterol   . HTN (hypertension)   . Lower GI bleed     Patient Active Problem List   Diagnosis Date Noted  . GI bleed 06/05/2018  . HTN (hypertension)   . High cholesterol   . Diverticulosis   . Dementia ([place])   . CAD (coronary artery disease)   . Lower GI bleed   . AAA (abdominal aortic aneurysm) without rupture ([place]) 01/09/2018  . Ascending aortic aneurysm ([place]) 01/09/2018  . Heart block AV second degree 07/15/2017  . Dementia arising in the senium and presenium ([place]) 07/15/2017  . Status post three vessel coronary artery bypass 07/15/2017  . Left ventricular systolic dysfunction without heart failure 05/11/2017  . Hypercholesterolemia 05/11/2017  . Long term current use of anticoagulant 08/02/2016  . CHB (complete heart block) ([place]) 06/07/2015  . Alzheimer's  disease ([place]) 01/26/2015  . Dementia with behavioral disturbance ([place]) 07/28/2013  . Pacemaker 01/26/2013  . Permanent atrial fibrillation ([place]) 01/26/2013  . Second degree AV block, Mobitz type II 01/26/2013  . Coronary artery disease involving native coronary artery of native heart without angina pectoris 01/26/2013  . Dyspnea 01/26/2013    Past Surgical History:  Procedure Laterality Date  . angiolplasty    . APPENDECTOMY    . INSERT / REPLACE / [place]    . PILONIDAL CYST EXCISION    . rotator cuff surgery  1985  . TONSILLECTOMY    . triple heart bypass          Home Medications    Prior to Admission medications   Medication Sig Start Date End Date Taking? Authorizing Provider  atorvastatin (LIPITOR) 40 MG tablet Take 40 mg by mouth every evening.    Yes [provider]  Cholecalciferol (VITAMIN D) 2000 UNITS CAPS Take 4,000 Units by mouth daily.    Yes [provider]  donepezil (ARICEPT) 5 MG tablet Take 1 tablet (5 mg total) by mouth 2 (two) times daily. 07/15/17  Yes Dohmeier, Asencion Partridge, MD  finasteride (PROSCAR) 5 MG tablet Take 5 mg by mouth every evening.    Yes [provider]  Multiple Vitamin (MULTIVITAMIN WITH MINERALS) TABS tablet Take 1 tablet by mouth daily.   Yes [provider]  Family History Family History  Problem Relation Age of Onset  . Stroke Father   . Cervical cancer Unknown        siblings  . COPD Sister   . Dementia Brother     Social History Social History   Tobacco Use  . Smoking status: Former Smoker    Last attempt to quit: 07/02/1974    Years since quitting: 43.9  . Smokeless tobacco: Never Used  Substance Use Topics  . Alcohol use: Yes    Comment: 4.5 ounces per day  . Drug use: No     Allergies   Amiodarone and Antihistamines, diphenhydramine-type   Review of Systems Review of Systems  Unable to perform ROS: Dementia  Constitutional: Positive for fatigue.    Neurological: Positive for syncope.     Physical Exam Updated Vital Signs BP 130/71 (BP Location: Right Arm)   Pulse 60   Temp 98 F (36.7 C) (Oral)   Resp 19   Ht 6' (1.829 m)   Wt 77.1 kg   SpO2 100%   BMI 23.06 kg/m   Physical Exam  Constitutional: He appears well-developed and well-nourished. He appears distressed.  HENT:  Head: Normocephalic and atraumatic.  Dry MM  Eyes: Conjunctivae are normal.  Neck: Neck supple.  Cardiovascular: Normal rate, regular rhythm and normal heart sounds. Exam reveals no friction rub.  No murmur heard. Pulmonary/Chest: Effort normal and breath sounds normal. No respiratory distress. He has no wheezes. He has no rales.  Abdominal: Soft. Bowel sounds are normal. He exhibits no distension. There is no tenderness.  Genitourinary:  Genitourinary Comments: Grossly bloody., maroon-colored stool in rectal vault  Musculoskeletal: He exhibits no edema.  Neurological: He is alert. He exhibits normal muscle tone.  Oriented to person only. mAE with 5/5 strength.  Skin: Skin is warm. Capillary refill takes less than 2 seconds. There is pallor.  Psychiatric: He has a normal mood and affect.  Nursing note and vitals reviewed.    ED Treatments / Results  Labs (all labs ordered are listed, but only abnormal results are displayed) Labs Reviewed  CBC WITH DIFFERENTIAL/PLATELET - Abnormal; Notable for the following components:      Result Value   WBC 12.8 (*)    RBC 3.40 (*)    Hemoglobin 10.6 (*)    HCT 34.7 (*)    MCV 102.1 (*)    Neutro Abs 8.2 (*)    Monocytes Absolute 1.2 (*)    All other components within normal limits  COMPREHENSIVE METABOLIC PANEL - Abnormal; Notable for the following components:   Glucose, Bld 209 (*)    BUN 26 (*)    Creatinine, Ser 1.43 (*)    Calcium 8.8 (*)    Albumin 2.6 (*)    GFR calc non Af Amer 43 (*)    GFR calc Af Amer 50 (*)    All other components within normal limits  PROTIME-INR - Abnormal; Notable  for the following components:   Prothrombin Time 28.4 (*)    All other components within normal limits  CBC - Abnormal; Notable for the following components:   RBC 2.57 (*)    Hemoglobin 8.0 (*)    HCT 26.4 (*)    MCV 102.7 (*)    All other components within normal limits  PROTIME-INR - Abnormal; Notable for the following components:   Prothrombin Time 18.0 (*)    All other components within normal limits  CBC - Abnormal; Notable for the following components:  RBC 2.77 (*)    Hemoglobin 8.7 (*)    HCT 27.5 (*)    All other components within normal limits  I-STAT CHEM 8, ED - Abnormal; Notable for the following components:   BUN 27 (*)    Creatinine, Ser 1.30 (*)    Glucose, Bld 202 (*)    Calcium, Ion 1.11 (*)    Hemoglobin 12.2 (*)    HCT 36.0 (*)    All other components within normal limits  I-STAT CG4 LACTIC ACID, ED - Abnormal; Notable for the following components:   Lactic Acid, Venous 6.37 (*)    All other components within normal limits  POC OCCULT BLOOD, ED - Abnormal; Notable for the following components:   Fecal Occult Bld POSITIVE (*)    All other components within normal limits  CULTURE, BLOOD (ROUTINE X 2)  CULTURE, BLOOD (ROUTINE X 2)  CBC  CBC  CBC  BASIC METABOLIC PANEL  I-STAT TROPONIN, ED  I-STAT CG4 LACTIC ACID, ED  TYPE AND SCREEN  PREPARE FRESH FROZEN PLASMA  PREPARE RBC (CROSSMATCH)  PREPARE RBC (CROSSMATCH)    EKG None  Radiology Ct Head Wo Contrast  Result Date: 06/05/2018 CLINICAL DATA:  84 year old male with C-spine trauma. EXAM: CT HEAD WITHOUT CONTRAST CT CERVICAL SPINE WITHOUT CONTRAST TECHNIQUE: Multidetector CT imaging of the head and cervical spine was performed following the standard protocol without intravenous contrast. Multiplanar CT image reconstructions of the cervical spine were also generated. COMPARISON:  Head CT dated 05/11/2016 FINDINGS: CT HEAD FINDINGS Brain: There is moderate age-related atrophy and chronic  microvascular ischemic changes. There is no acute intracranial hemorrhage. No mass effect or midline shift. No extra-axial fluid collection. Vascular: No hyperdense vessel or unexpected calcification. Skull: Normal. Negative for fracture or focal lesion. Sinuses/Orbits: No acute finding. Other: None CT CERVICAL SPINE FINDINGS Alignment: No acute subluxation. There is grade 1 C7-T1 anterolisthesis. Skull base and vertebrae: No acute fracture. There is advanced osteopenia. Soft tissues and spinal canal: No prevertebral fluid or swelling. No visible canal hematoma. Disc levels: C6-C7 ankylosis. There is multilevel degenerative changes with disc space narrowing and endplate irregularity. Multilevel facet hypertrophy. Upper chest: Emphysema. Other: None IMPRESSION: 1. No acute intracranial hemorrhage. Moderate age-related atrophy and chronic microvascular ischemic changes. 2. No acute/traumatic cervical spine pathology. Multilevel degenerative changes. Electronically Signed   By: Anner Crete M.D.   On: 06/05/2018 06:47   Ct Cervical Spine Wo Contrast  Result Date: 06/05/2018 CLINICAL DATA:  84 year old male with C-spine trauma. EXAM: CT HEAD WITHOUT CONTRAST CT CERVICAL SPINE WITHOUT CONTRAST TECHNIQUE: Multidetector CT imaging of the head and cervical spine was performed following the standard protocol without intravenous contrast. Multiplanar CT image reconstructions of the cervical spine were also generated. COMPARISON:  Head CT dated 05/11/2016 FINDINGS: CT HEAD FINDINGS Brain: There is moderate age-related atrophy and chronic microvascular ischemic changes. There is no acute intracranial hemorrhage. No mass effect or midline shift. No extra-axial fluid collection. Vascular: No hyperdense vessel or unexpected calcification. Skull: Normal. Negative for fracture or focal lesion. Sinuses/Orbits: No acute finding. Other: None CT CERVICAL SPINE FINDINGS Alignment: No acute subluxation. There is grade 1 C7-T1  anterolisthesis. Skull base and vertebrae: No acute fracture. There is advanced osteopenia. Soft tissues and spinal canal: No prevertebral fluid or swelling. No visible canal hematoma. Disc levels: C6-C7 ankylosis. There is multilevel degenerative changes with disc space narrowing and endplate irregularity. Multilevel facet hypertrophy. Upper chest: Emphysema. Other: None IMPRESSION: 1. No acute intracranial hemorrhage. Moderate age-related  atrophy and chronic microvascular ischemic changes. 2. No acute/traumatic cervical spine pathology. Multilevel degenerative changes. Electronically Signed   By: Anner Crete M.D.   On: 06/05/2018 06:47   Dg Abdomen Acute W/chest  Result Date: 06/05/2018 CLINICAL DATA:  Abdominal pain tonight. EXAM: DG ABDOMEN ACUTE W/ 1V CHEST COMPARISON:  Chest radiograph May 08, 2017 FINDINGS: Cardiac silhouette is mildly enlarged and unchanged. Ectatic calcified aorta. Chronic interstitial changes without pleural effusion or focal consolidation. No pneumothorax. Old RIGHT rib fractures. Osteopenia. Bowel gas pattern is nondilated and nonobstructive. 2.3 cm coarse calcification LEFT abdomen. No intra-abdominal mass effect. Advanced vascular calcifications. Osteopenia. Severe lumbar spondylosis. IMPRESSION: 1. Mild cardiomegaly and chronic interstitial changes. 2. Nonspecific bowel gas pattern. LEFT abdomen calcification likely reflects nephrolithiasis. 3.  Aortic Atherosclerosis (ICD10-I70.0). Electronically Signed   By: Elon Alas M.D.   On: 06/05/2018 04:40   Ct Angio Abd/pel W And/or Wo Contrast  Result Date: 06/05/2018 CLINICAL DATA:  Syncopal episode.  Gastrointestinal bleeding. EXAM: CTA ABDOMEN AND PELVIS wITHOUT AND WITH CONTRAST TECHNIQUE: Multidetector CT imaging of the abdomen and pelvis was performed using the standard protocol during bolus administration of intravenous contrast. Multiplanar reconstructed images and MIPs were obtained and reviewed to evaluate  the vascular anatomy. CONTRAST:  47mL ISOVUE-370 IOPAMIDOL (ISOVUE-370) INJECTION 76% COMPARISON:  Abdominal ultrasound 01/06/2018. CT abdomen pelvis January 2017. FINDINGS: VASCULAR Aorta: Diffuse atherosclerosis of the aorta. Maximal diameter of the infrarenal aorta measures 3.1 cm, increased from 2.7 cm in 2017. No sign of aortic stenosis. Celiac: 20% origin stenosis. No evidence of any extravasation in that vascular territory. SMA: 20% stenosis 1 cm beyond the origin. No evidence of extravasation in that vascular territory. Renals: Single renal artery on the right. Tiny accessory upper pole renal artery on the left. Atherosclerotic disease at both renal artery origins. 50-70% stenosis on the right. 30-50% stenosis on the left. IMA: Inferior mesenteric artery shows flow. Inflow: Normal Proximal Outflow: Bilateral iliac atherosclerotic disease. No flow limiting stenosis suspected. Common femoral arteries show atherosclerosis but no stenosis greater than 30%. Veins: Normal Review of the MIP images confirms the above findings. NON-VASCULAR Lower chest: Scarring or atelectasis at the lung bases. Cardiomegaly. Hepatobiliary: No calcified gallstones. Probable cirrhosis of the liver. No focal lesion. Pancreas: Normal Spleen: Normal Adrenals/Urinary Tract: Adrenal glands are normal. Right kidney is normal. Left kidney shows a chronic stone it in the extrarenal pelvis and UPJ region measuring up to 2.4 cm in size with chronic fullness of the left renal collecting system. Additional nonobstructing 6 mm stone in the lower pole of the left kidney. Bladder is negative. Stomach/Bowel: Liquid material within the colon. No evidence of vascular extravasation is present. There is sigmoid diverticulosis without evidence of diverticulitis. Lymphatic: No adenopathy. Reproductive: No acute finding. Other: No free fluid or air. Musculoskeletal: Extensive chronic degenerative changes of the spine. IMPRESSION: VASCULAR Atherosclerotic  disease of the aorta. Maximal diameter of the infrarenal aorta is 3.1 cm. Atherosclerosis of the branch vessels as outlined above without acute relevant finding to the clinical presentation. NON-VASCULAR Liquid stool in the colon. I do not see any evidence of vascular extravasation identifiable by this exam. Cirrhosis of the liver. Sigmoid diverticulosis without evidence of diverticulitis. Chronic 2.4 cm stone in the left extra renal pelvis with chronic fullness of the left renal collecting system. Electronically Signed   By: Nelson Chimes M.D.   On: 06/05/2018 06:55    Procedures .Critical Care Performed by: Duffy Bruce, MD Authorized by: Duffy Bruce, MD  Critical care provider statement:    Critical care time (minutes):  55   Critical care time was exclusive of:  Separately billable procedures and treating other patients and teaching time   Critical care was necessary to treat or prevent imminent or life-threatening deterioration of the following conditions:  Cardiac failure and circulatory failure   Critical care was time spent personally by me on the following activities:  Development of treatment plan with patient or surrogate, discussions with consultants, evaluation of patient's response to treatment, examination of patient, obtaining history from patient or surrogate, ordering and performing treatments and interventions, ordering and review of laboratory studies, ordering and review of radiographic studies, pulse oximetry, re-evaluation of patient's condition and review of old charts   I assumed direction of critical care for this patient from another provider in my specialty: no     (including critical care time)  Medications Ordered in ED Medications  0.9 %  sodium chloride infusion (Manually program via Guardrails IV Fluids) ( Intravenous Hold 06/05/18 1511)  pantoprazole (PROTONIX) injection 40 mg (40 mg Intravenous Given 06/05/18 1500)  atorvastatin (LIPITOR) tablet 40 mg (40  mg Oral Given 06/05/18 1859)  donepezil (ARICEPT) tablet 5 mg (has no administration in time range)  finasteride (PROSCAR) tablet 5 mg (5 mg Oral Given 06/05/18 1859)  acetaminophen (TYLENOL) tablet 650 mg (has no administration in time range)    Or  acetaminophen (TYLENOL) suppository 650 mg (has no administration in time range)  ondansetron (ZOFRAN) tablet 4 mg (has no administration in time range)    Or  ondansetron (ZOFRAN) injection 4 mg (has no administration in time range)  sodium chloride flush (NS) 0.9 % injection 3 mL (3 mLs Intravenous Not Given 06/05/18 1511)  0.9 %  sodium chloride infusion (900 mLs Intravenous New Bag/Given 06/05/18 1739)  0.9 %  sodium chloride infusion (Manually program via Guardrails IV Fluids) (has no administration in time range)  sodium chloride 0.9 % bolus 1,000 mL (0 mLs Intravenous Stopped 06/05/18 0636)    And  sodium chloride 0.9 % bolus 1,000 mL (0 mLs Intravenous Stopped 06/05/18 0636)    And  sodium chloride 0.9 % bolus 500 mL (0 mLs Intravenous Stopped 06/05/18 0637)  ceFEPIme (MAXIPIME) 2 g in sodium chloride 0.9 % 100 mL IVPB (0 g Intravenous Stopped 06/05/18 0636)  vancomycin (VANCOCIN) 1,500 mg in sodium chloride 0.9 % 500 mL IVPB (0 mg Intravenous Stopped 06/05/18 0637)  iopamidol (ISOVUE-370) 76 % injection 100 mL (80 mLs Intravenous Contrast Given 06/05/18 0525)  phytonadione (VITAMIN K) 10 mg in dextrose 5 % 50 mL IVPB (0 mg Intravenous Stopped 06/05/18 0813)     Initial Impression / Assessment and Plan / ED Course  I have reviewed the triage vital signs and the nursing notes.  Pertinent labs & imaging results that were available during my care of the patient were reviewed by me and considered in my medical decision making (see chart for details).    84 year old male here with syncopal episode.  Patient also with bloody stools.  On arrival, patient confused and in moderate distress.  Concern for possible GI bleed with syncope, but must also  consider mesenteric ischemia, severe sepsis.  Patient is hypothermic, bradycardic, and hypotensive.  Will start broad-spectrum antibiotics given lactate of 6 with initially largely normal hemoglobin.  Will cover empirically for sepsis with this, give fluids, placed on Bair hugger, and sent for stat CT angios.  CT angios shows diverticulosis without acute inflammation.  It  is otherwise negative for acute vascular occlusion or other emergent, surgical abnormality.  Patient does appear improved clinically with IV fluids and warming, but remains hypotensive.  He had a second, grossly bloody bowel movement in the ED.  Given his persistent hypotension, will reverse, discuss with GI.  Dw GI - Dr. Henrene Pastor to see. Pt remains hypotensive, will consult ICU for admission. FFP/Vit K given.  Final Clinical Impressions(s) / ED Diagnoses   Final diagnoses:  Essential hypertension  High cholesterol  Diverticulosis  Dementia without behavioral disturbance, unspecified dementia type ([place])  Coronary artery disease involving native coronary artery of native heart without angina pectoris  Persistent atrial fibrillation  Lower GI bleed    ED Discharge Orders    None       Duffy Bruce, MD 06/05/18 Latanya Maudlin, MD 06/17/18 [phone]

## 2018-06-05 NOTE — ED Notes (Signed)
Pt had large bloody BM approximately 300cc.

## 2018-06-05 NOTE — ED Notes (Signed)
Pt had bloody BM approximately 300cc.

## 2018-06-05 NOTE — Consult Note (Signed)
[facility] CONSULT Reason for consult: GI bleeding Referring Physician: Emergency room.  PCP: [name].  Cardiology Dr.Croitoru.  GI: Dr. Cleon Gustin [name] [name] is an 84 y.o. male.  HPI: He had a GI bleed in 2011 had some abdominal pain underwent colonoscopy by Dr. Cristina Gong.This showed pandiverticulosis with a large amount of old blood in the transverse colon with a small polyp.  He has had previous colonoscopies by Dr. Earle Gell without any other findings other than small adenomatous polyps.  He has atrial fibrillation and is anticoagulated with Coumadin.  He apparently passed out and hit his head yesterday had melenic maroonish stools and came into the ER.  INR was 2.7 and was reversed with FFP and vitamin K.  His blood pressure has come up with IV fluids.  His BUN was 27 and creatinine 1.3.Initial labs showed a hemoglobin of 8.0 the patient has some mild dementia and his wife gives a good part of the history but he has not had any bleeding until recently and denies abdominal pain.  He apparently fell in the bathroom trying to go to the bathroom and this is what precipitated being brought to the emergency room. He has multiple other medical problems including CAD status post CABG with pacemaker, cardiomyopathy, congestive heart failure, history of respiratory failure with prolonged hospitalization with ventilation,Arrhythmia requiring the pacemaker.  Past Medical History:  Diagnosis Date  . Atrial fibrillation ([place])   . CAD (coronary artery disease)   . Dementia ([place])   . Diverticulosis    diverticular bleed 2011  . High cholesterol   . HTN (hypertension)     Past Surgical History:  Procedure Laterality Date  . angiolplasty    . APPENDECTOMY    . INSERT / REPLACE / [place]    . PILONIDAL CYST EXCISION    . rotator cuff surgery  1985  . TONSILLECTOMY    . triple heart bypass      Family History  Problem Relation Age of Onset  . Stroke  Father   . Cervical cancer Unknown        siblings  . COPD Sister   . Dementia Brother     Social History:  reports that he quit smoking about 43 years ago. He has never used smokeless tobacco. He reports that he drinks alcohol. He reports that he does not use drugs.  Allergies:  Allergies  Allergen Reactions  . Amiodarone Other (See Comments)    Had a severe lung infection in 2003  . Antihistamines, Diphenhydramine-Type Other (See Comments)    Jittery    Medications; Prior to Admission medications   Medication Sig Start Date End Date Taking? Authorizing Provider  atorvastatin (LIPITOR) 40 MG tablet Take 40 mg by mouth every evening.    Yes [provider]  Cholecalciferol (VITAMIN D) 2000 UNITS CAPS Take 4,000 Units by mouth daily.    Yes [provider]  donepezil (ARICEPT) 5 MG tablet Take 1 tablet (5 mg total) by mouth 2 (two) times daily. 07/15/17  Yes Dohmeier, Asencion Partridge, MD  finasteride (PROSCAR) 5 MG tablet Take 5 mg by mouth every evening.    Yes [provider]  Multiple Vitamin (MULTIVITAMIN WITH MINERALS) TABS tablet Take 1 tablet by mouth daily.   Yes [provider]  warfarin (COUMADIN) 3 MG tablet Take 3-4 mg by mouth every evening. Alternating daily   Yes [provider]   . sodium chloride   Intravenous  Once  . sodium chloride   Intravenous Once  . pantoprazole (PROTONIX) IV  40 mg Intravenous Q12H   PRN Meds  Results for orders placed or performed during the hospital encounter of 06/05/18 (from the past 48 hour(s))  POC occult blood, ED     Status: Abnormal   Collection Time: 06/05/18  3:32 AM  Result Value Ref Range   Fecal Occult Bld POSITIVE (A) NEGATIVE  Type and screen [place]     Status: None (Preliminary result)   Collection Time: 06/05/18  3:36 AM  Result Value Ref Range   ABO/RH(D) O POS    Antibody Screen NEG    Sample Expiration      06/08/2018 Performed at [facility],  [place] [address] [place], [place] [address]    Unit Number [id number]    Blood Component Type RED CELLS,LR    Unit division 00    Status of Unit ALLOCATED    Transfusion Status OK TO TRANSFUSE    Crossmatch Result Compatible   CBC with Differential     Status: Abnormal   Collection Time: 06/05/18  3:39 AM  Result Value Ref Range   WBC 12.8 (H) 4.0 - 10.5 K/uL   RBC 3.40 (L) 4.22 - 5.81 MIL/uL   Hemoglobin 10.6 (L) 13.0 - 17.0 g/dL   HCT 34.7 (L) 39.0 - 52.0 %   MCV 102.1 (H) 80.0 - 100.0 fL   MCH 31.2 26.0 - 34.0 pg   MCHC 30.5 30.0 - 36.0 g/dL   RDW 13.8 11.5 - 15.5 %   Platelets 216 150 - 400 K/uL   nRBC 0.0 0.0 - 0.2 %   Neutrophils Relative % 64 %   Neutro Abs 8.2 (H) 1.7 - 7.7 K/uL   Lymphocytes Relative 24 %   Lymphs Abs 3.1 0.7 - 4.0 K/uL   Monocytes Relative 9 %   Monocytes Absolute 1.2 (H) 0.1 - 1.0 K/uL   Eosinophils Relative 2 %   Eosinophils Absolute 0.2 0.0 - 0.5 K/uL   Basophils Relative 0 %   Basophils Absolute 0.1 0.0 - 0.1 K/uL   Immature Granulocytes 1 %   Abs Immature Granulocytes 0.07 0.00 - 0.07 K/uL    Comment: Performed at [facility], [address] [place], [place] [address]  Comprehensive metabolic panel     Status: Abnormal   Collection Time: 06/05/18  3:39 AM  Result Value Ref Range   Sodium 140 135 - 145 mmol/L   Potassium 4.0 3.5 - 5.1 mmol/L   Chloride 103 98 - 111 mmol/L   CO2 22 22 - 32 mmol/L   Glucose, Bld 209 (H) 70 - 99 mg/dL   BUN 26 (H) 8 - 23 mg/dL   Creatinine, Ser 1.43 (H) 0.61 - 1.24 mg/dL   Calcium 8.8 (L) 8.9 - 10.3 mg/dL   Total Protein 6.9 6.5 - 8.1 g/dL   Albumin 2.6 (L) 3.5 - 5.0 g/dL   AST 30 15 - 41 U/L   ALT 15 0 - 44 U/L   Alkaline Phosphatase 94 38 - 126 U/L   Total Bilirubin 0.6 0.3 - 1.2 mg/dL   GFR calc non Af Amer 43 (L) >60 mL/min   GFR calc Af Amer 50 (L) >60 mL/min   Anion gap 15 5 - 15    Comment: Performed at [facility] [address], [place], [place] [address]  Protime-INR     Status:  Abnormal  Collection Time: 06/05/18  3:39 AM  Result Value Ref Range   Prothrombin Time 28.4 (H) 11.4 - 15.2 seconds   INR 2.71     Comment: Performed at [facility] [address], [place], [place] [address]  I-Stat Troponin, ED (not at [facility])     Status: None   Collection Time: 06/05/18  3:43 AM  Result Value Ref Range   Troponin i, poc 0.03 0.00 - 0.08 ng/mL   Comment 3            Comment: Due to the release kinetics of cTnI, a negative result within the first hours of the onset of symptoms does not rule out myocardial infarction with certainty. If myocardial infarction is still suspected, repeat the test at appropriate intervals.   I-Stat Chem 8, ED     Status: Abnormal   Collection Time: 06/05/18  3:45 AM  Result Value Ref Range   Sodium 141 135 - 145 mmol/L   Potassium 4.0 3.5 - 5.1 mmol/L   Chloride 106 98 - 111 mmol/L   BUN 27 (H) 8 - 23 mg/dL   Creatinine, Ser 1.30 (H) 0.61 - 1.24 mg/dL   Glucose, Bld 202 (H) 70 - 99 mg/dL   Calcium, Ion 1.11 (L) 1.15 - 1.40 mmol/L   TCO2 24 22 - 32 mmol/L   Hemoglobin 12.2 (L) 13.0 - 17.0 g/dL   HCT 36.0 (L) 39.0 - 52.0 %  I-Stat CG4 Lactic Acid, ED     Status: Abnormal   Collection Time: 06/05/18  3:45 AM  Result Value Ref Range   Lactic Acid, Venous 6.37 (HH) 0.5 - 1.9 mmol/L   Comment NOTIFIED PHYSICIAN   Prepare fresh frozen plasma     Status: None (Preliminary result)   Collection Time: 06/05/18  7:19 AM  Result Value Ref Range   Unit Number [id number]    Blood Component Type THAWED PLASMA    Unit division 00    Status of Unit ISSUED    Transfusion Status OK TO TRANSFUSE    Unit Number [id number]    Blood Component Type THAWED PLASMA    Unit division 00    Status of Unit ISSUED    Transfusion Status      OK TO TRANSFUSE Performed at [facility], [address] [place], [place] [address]   CBC     Status: Abnormal   Collection Time: 06/05/18  7:41 AM  Result Value Ref Range   WBC 9.0 4.0 - 10.5  K/uL   RBC 2.57 (L) 4.22 - 5.81 MIL/uL   Hemoglobin 8.0 (L) 13.0 - 17.0 g/dL   HCT 26.4 (L) 39.0 - 52.0 %   MCV 102.7 (H) 80.0 - 100.0 fL   MCH 31.1 26.0 - 34.0 pg   MCHC 30.3 30.0 - 36.0 g/dL   RDW 13.9 11.5 - 15.5 %   Platelets 174 150 - 400 K/uL   nRBC 0.0 0.0 - 0.2 %    Comment: Performed at [facility], [place] [address], [place], [place] [address]  I-Stat CG4 Lactic Acid, ED     Status: None   Collection Time: 06/05/18  7:51 AM  Result Value Ref Range   Lactic Acid, Venous 1.10 0.5 - 1.9 mmol/L  Prepare RBC     Status: None   Collection Time: 06/05/18  9:56 AM  Result Value Ref Range   Order Confirmation      ORDER PROCESSED BY BLOOD BANK Performed at  [facility], [place] [address] [place], [place] [address]     Ct Head Wo Contrast  Result Date: 06/05/2018 CLINICAL DATA:  84 year old male with C-spine trauma. EXAM: CT HEAD WITHOUT CONTRAST CT CERVICAL SPINE WITHOUT CONTRAST TECHNIQUE: Multidetector CT imaging of the head and cervical spine was performed following the standard protocol without intravenous contrast. Multiplanar CT image reconstructions of the cervical spine were also generated. COMPARISON:  Head CT dated 05/11/2016 FINDINGS: CT HEAD FINDINGS Brain: There is moderate age-related atrophy and chronic microvascular ischemic changes. There is no acute intracranial hemorrhage. No mass effect or midline shift. No extra-axial fluid collection. Vascular: No hyperdense vessel or unexpected calcification. Skull: Normal. Negative for fracture or focal lesion. Sinuses/Orbits: No acute finding. Other: None CT CERVICAL SPINE FINDINGS Alignment: No acute subluxation. There is grade 1 C7-T1 anterolisthesis. Skull base and vertebrae: No acute fracture. There is advanced osteopenia. Soft tissues and spinal canal: No prevertebral fluid or swelling. No visible canal hematoma. Disc levels: C6-C7 ankylosis. There is multilevel degenerative changes with disc space narrowing and  endplate irregularity. Multilevel facet hypertrophy. Upper chest: Emphysema. Other: None IMPRESSION: 1. No acute intracranial hemorrhage. Moderate age-related atrophy and chronic microvascular ischemic changes. 2. No acute/traumatic cervical spine pathology. Multilevel degenerative changes. Electronically Signed   By: Anner Crete M.D.   On: 06/05/2018 06:47   Ct Cervical Spine Wo Contrast  Result Date: 06/05/2018 CLINICAL DATA:  84 year old male with C-spine trauma. EXAM: CT HEAD WITHOUT CONTRAST CT CERVICAL SPINE WITHOUT CONTRAST TECHNIQUE: Multidetector CT imaging of the head and cervical spine was performed following the standard protocol without intravenous contrast. Multiplanar CT image reconstructions of the cervical spine were also generated. COMPARISON:  Head CT dated 05/11/2016 FINDINGS: CT HEAD FINDINGS Brain: There is moderate age-related atrophy and chronic microvascular ischemic changes. There is no acute intracranial hemorrhage. No mass effect or midline shift. No extra-axial fluid collection. Vascular: No hyperdense vessel or unexpected calcification. Skull: Normal. Negative for fracture or focal lesion. Sinuses/Orbits: No acute finding. Other: None CT CERVICAL SPINE FINDINGS Alignment: No acute subluxation. There is grade 1 C7-T1 anterolisthesis. Skull base and vertebrae: No acute fracture. There is advanced osteopenia. Soft tissues and spinal canal: No prevertebral fluid or swelling. No visible canal hematoma. Disc levels: C6-C7 ankylosis. There is multilevel degenerative changes with disc space narrowing and endplate irregularity. Multilevel facet hypertrophy. Upper chest: Emphysema. Other: None IMPRESSION: 1. No acute intracranial hemorrhage. Moderate age-related atrophy and chronic microvascular ischemic changes. 2. No acute/traumatic cervical spine pathology. Multilevel degenerative changes. Electronically Signed   By: Anner Crete M.D.   On: 06/05/2018 06:47   Dg Abdomen Acute  W/chest  Result Date: 06/05/2018 CLINICAL DATA:  Abdominal pain tonight. EXAM: DG ABDOMEN ACUTE W/ 1V CHEST COMPARISON:  Chest radiograph May 08, 2017 FINDINGS: Cardiac silhouette is mildly enlarged and unchanged. Ectatic calcified aorta. Chronic interstitial changes without pleural effusion or focal consolidation. No pneumothorax. Old RIGHT rib fractures. Osteopenia. Bowel gas pattern is nondilated and nonobstructive. 2.3 cm coarse calcification LEFT abdomen. No intra-abdominal mass effect. Advanced vascular calcifications. Osteopenia. Severe lumbar spondylosis. IMPRESSION: 1. Mild cardiomegaly and chronic interstitial changes. 2. Nonspecific bowel gas pattern. LEFT abdomen calcification likely reflects nephrolithiasis. 3.  Aortic Atherosclerosis (ICD10-I70.0). Electronically Signed   By: Elon Alas M.D.   On: 06/05/2018 04:40   Ct Angio Abd/pel W And/or Wo Contrast  Result Date: 06/05/2018 CLINICAL DATA:  Syncopal episode.  Gastrointestinal bleeding. EXAM: CTA ABDOMEN AND PELVIS wITHOUT AND WITH CONTRAST TECHNIQUE: Multidetector CT  imaging of the abdomen and pelvis was performed using the standard protocol during bolus administration of intravenous contrast. Multiplanar reconstructed images and MIPs were obtained and reviewed to evaluate the vascular anatomy. CONTRAST:  23mL ISOVUE-370 IOPAMIDOL (ISOVUE-370) INJECTION 76% COMPARISON:  Abdominal ultrasound 01/06/2018. CT abdomen pelvis January 2017. FINDINGS: VASCULAR Aorta: Diffuse atherosclerosis of the aorta. Maximal diameter of the infrarenal aorta measures 3.1 cm, increased from 2.7 cm in 2017. No sign of aortic stenosis. Celiac: 20% origin stenosis. No evidence of any extravasation in that vascular territory. SMA: 20% stenosis 1 cm beyond the origin. No evidence of extravasation in that vascular territory. Renals: Single renal artery on the right. Tiny accessory upper pole renal artery on the left. Atherosclerotic disease at both renal artery  origins. 50-70% stenosis on the right. 30-50% stenosis on the left. IMA: Inferior mesenteric artery shows flow. Inflow: Normal Proximal Outflow: Bilateral iliac atherosclerotic disease. No flow limiting stenosis suspected. Common femoral arteries show atherosclerosis but no stenosis greater than 30%. Veins: Normal Review of the MIP images confirms the above findings. NON-VASCULAR Lower chest: Scarring or atelectasis at the lung bases. Cardiomegaly. Hepatobiliary: No calcified gallstones. Probable cirrhosis of the liver. No focal lesion. Pancreas: Normal Spleen: Normal Adrenals/Urinary Tract: Adrenal glands are normal. Right kidney is normal. Left kidney shows a chronic stone it in the extrarenal pelvis and UPJ region measuring up to 2.4 cm in size with chronic fullness of the left renal collecting system. Additional nonobstructing 6 mm stone in the lower pole of the left kidney. Bladder is negative. Stomach/Bowel: Liquid material within the colon. No evidence of vascular extravasation is present. There is sigmoid diverticulosis without evidence of diverticulitis. Lymphatic: No adenopathy. Reproductive: No acute finding. Other: No free fluid or air. Musculoskeletal: Extensive chronic degenerative changes of the spine. IMPRESSION: VASCULAR Atherosclerotic disease of the aorta. Maximal diameter of the infrarenal aorta is 3.1 cm. Atherosclerosis of the branch vessels as outlined above without acute relevant finding to the clinical presentation. NON-VASCULAR Liquid stool in the colon. I do not see any evidence of vascular extravasation identifiable by this exam. Cirrhosis of the liver. Sigmoid diverticulosis without evidence of diverticulitis. Chronic 2.4 cm stone in the left extra renal pelvis with chronic fullness of the left renal collecting system. Electronically Signed   By: Nelson Chimes M.D.   On: 06/05/2018 06:55               Blood pressure 104/67, pulse (!) 59, temperature 97.8 F (36.6 C),  temperature source Oral, resp. rate 17, height 6' (1.829 m), weight 77.1 kg, SpO2 99 %.  Physical exam:   General--thin white male in no distress. ENT--nonicteric Neck--supple Heart--tachycardic  Lungs--grossly clear Abdomen--soft and completely nontender Psych--very pleasant but unable to answer questions ask his wife to answer for him.   Assessment: 1.  GI bleed.  This is likely diverticular with minimal elevation of BUN and creatinine no hematemesis etc.  He has had diverticular bleeds in the past and is anticoagulated.  He does appear to be more stable after his Coumadin has been reversed. 2.  PAF.  He is chronically anticoagulated 3.  CAD.  Has history of CABG, pacemaker and history of congestive heart failure 4.  Mild dementia  Plan: 1.  At this point I would go ahead and reverse his Coumadin and see how he does.  I do not think there is any place for urgent colonoscopy.  I doubt that he has a upper GI bleed but if he continues to  bleed we could consider EGD but at this point I would go ahead and treat empirically with PPI keep on clear liquids etc. We will follow with you.   Nancy Fetter 06/05/2018, 10:40 AM   This note was created using voice recognition software and minor errors may Have occurred unintentionally. Pager: [phone] If no answer or after hours call [phone]

## 2018-06-05 NOTE — ED Notes (Signed)
Attempted report x1. 

## 2018-06-05 NOTE — H&P (Signed)
History and Physical    [name] [name] OZH:[id number] DOB: [birth year] DOA: 06/05/2018  PCP: [name], MD Consultants:  Croitoru - cardiology; Dohmeier - neurology; [place] surgery Patient coming from:  Home - lives with wife; NOK: wife, [phone]  Chief Complaint: GI bleed  HPI: [name] [name] is a 84 y.o. male with medical history significant of dementia; HTN; HLD; CAD; and afib on Coumadin presenting with GI bleed.  He remembers falling.  In the middle of the night, he went to the bathroom and passed a lot of blood.  He fell in the floor and they were unable to get him up.  EMS transported him to the hospital.  His wife was not aware of any problem prior.  He had "explosive diarrhea" with blood - bright red and dark.  He has had 2 more episodes of large volume BRBPR stools while in the ER.  He feels ok, would like to go home.  He had vitamin K and FFP in the ER but no blood yet.  He is on Coumadin, with last dose last night about 7pm (4 mg; increased alternating doses 4/3 changed a few weeks ago).  He had h/o MI at age 23 and developed afib thereafter; he is on Coumadin for afib.  About 10 years ago he was hospitalized and had EGD and colonoscopy, possibly for a bleed - review of records shows this was in 2011 and appears to indicated pandiverticulosis with lower GI bleeding.     ED Course:  ICU to see and will let us know the plan.  GI bleed, on Bair hugger, lactate markedly elevated, uncertain goals of care.  ICU has seen and requests [place] admission to SDU.  Review of Systems: As per HPI; otherwise review of systems reviewed and negative.   Ambulatory Status:  Ambulates without assistance  Past Medical History:  Diagnosis Date  . Atrial fibrillation ([place])   . CAD (coronary artery disease)   . Dementia ([place])   . Diverticulosis    diverticular bleed 2011  . High cholesterol   . HTN (hypertension)   . Lower GI bleed     Past Surgical History:  Procedure Laterality Date   . angiolplasty    . APPENDECTOMY    . INSERT / REPLACE / [place]    . PILONIDAL CYST EXCISION    . rotator cuff surgery  1985  . TONSILLECTOMY    . triple heart bypass      Social History   Socioeconomic History  . Marital status: Married    Spouse name: [name]  . Number of children: 6  . Years of education: college  . Highest education level: Not on file  Occupational History  . Occupation: retired  Scientific laboratory technician  . Financial resource strain: Not on file  . Food insecurity:    Worry: Not on file    Inability: Not on file  . Transportation needs:    Medical: Not on file    Non-medical: Not on file  Tobacco Use  . Smoking status: Former Smoker    Last attempt to quit: 07/02/1974    Years since quitting: 43.9  . Smokeless tobacco: Never Used  Substance and Sexual Activity  . Alcohol use: Yes    Comment: 4.5 ounces per day  . Drug use: No  . Sexual activity: Not on file  Lifestyle  . Physical activity:    Days per week: Not on file  Minutes per session: Not on file  . Stress: Not on file  Relationships  . Social connections:    Talks on phone: Not on file    Gets together: Not on file    Attends religious service: Not on file    Active member of club or organization: Not on file    Attends meetings of clubs or organizations: Not on file    Relationship status: Not on file  . Intimate partner violence:    Fear of current or ex partner: Not on file    Emotionally abused: Not on file    Physically abused: Not on file    Forced sexual activity: Not on file  Other Topics Concern  . Not on file  Social History Narrative   Patient is married [name]) and lives at home with his wife.   Patient has a college education.   Patient is a retired Materials engineer.   Patient does not drink any caffeine.   Patient has six children.    Allergies  Allergen Reactions  . Amiodarone Other (See Comments)    Had a severe lung  infection in 2003  . Antihistamines, Diphenhydramine-Type Other (See Comments)    Jittery    Family History  Problem Relation Age of Onset  . Stroke Father   . Cervical cancer Unknown        siblings  . COPD Sister   . Dementia Brother     Prior to Admission medications   Medication Sig Start Date End Date Taking? Authorizing Provider  atorvastatin (LIPITOR) 40 MG tablet Take 40 mg by mouth every evening.    Yes [provider]  Cholecalciferol (VITAMIN D) 2000 UNITS CAPS Take 4,000 Units by mouth daily.    Yes [provider]  donepezil (ARICEPT) 5 MG tablet Take 1 tablet (5 mg total) by mouth 2 (two) times daily. 07/15/17  Yes Dohmeier, Asencion Partridge, MD  finasteride (PROSCAR) 5 MG tablet Take 5 mg by mouth every evening.    Yes [provider]  Multiple Vitamin (MULTIVITAMIN WITH MINERALS) TABS tablet Take 1 tablet by mouth daily.   Yes [provider]  warfarin (COUMADIN) 3 MG tablet Take 3-4 mg by mouth every evening. Alternating daily   Yes [provider]    Physical Exam: Vitals:   06/05/18 1237 06/05/18 1400 06/05/18 1455 06/05/18 1530  BP: 111/70 131/68 112/71 108/60  Pulse: 65 65 65 (!) 33  Resp: 16 15 16 16   Temp: 97.9 F (36.6 C)  98.1 F (36.7 C)   TempSrc: Oral  Oral   SpO2: 99% 97% 98% 99%  Weight:      Height:         General:  Appears calm and comfortable and is NAD despite being on a Bair hugger Eyes:  PERRL, EOMI, normal lids, iris ENT:  Hard of hearing, lips & tongue, mmm Neck:  no LAD, masses or thyromegaly; no carotid bruits Cardiovascular:  RRR, no m/r/g. No LE edema.  Respiratory:   CTA bilaterally with no wheezes/rales/rhonchi.  Normal respiratory effort. Abdomen:  soft, NT, ND, NABS Skin:  no rash or induration seen on limited exam Musculoskeletal:  grossly normal tone BUE/BLE, good ROM, no bony abnormality Psychiatric:  grossly normal mood and affect, speech fluent and appropriate, AOx2, apparent mild  cognitive dysfunction but remarkably conversant in this circumstance Neurologic:  CN 2-12 grossly intact, moves all extremities in coordinated fashion, sensation intact    Radiological Exams on Admission:  Ct Head Wo Contrast  Result Date: 06/05/2018 CLINICAL DATA:  84 year old male with C-spine trauma. EXAM: CT HEAD WITHOUT CONTRAST CT CERVICAL SPINE WITHOUT CONTRAST TECHNIQUE: Multidetector CT imaging of the head and cervical spine was performed following the standard protocol without intravenous contrast. Multiplanar CT image reconstructions of the cervical spine were also generated. COMPARISON:  Head CT dated 05/11/2016 FINDINGS: CT HEAD FINDINGS Brain: There is moderate age-related atrophy and chronic microvascular ischemic changes. There is no acute intracranial hemorrhage. No mass effect or midline shift. No extra-axial fluid collection. Vascular: No hyperdense vessel or unexpected calcification. Skull: Normal. Negative for fracture or focal lesion. Sinuses/Orbits: No acute finding. Other: None CT CERVICAL SPINE FINDINGS Alignment: No acute subluxation. There is grade 1 C7-T1 anterolisthesis. Skull base and vertebrae: No acute fracture. There is advanced osteopenia. Soft tissues and spinal canal: No prevertebral fluid or swelling. No visible canal hematoma. Disc levels: C6-C7 ankylosis. There is multilevel degenerative changes with disc space narrowing and endplate irregularity. Multilevel facet hypertrophy. Upper chest: Emphysema. Other: None IMPRESSION: 1. No acute intracranial hemorrhage. Moderate age-related atrophy and chronic microvascular ischemic changes. 2. No acute/traumatic cervical spine pathology. Multilevel degenerative changes. Electronically Signed   By: Anner Crete M.D.   On: 06/05/2018 06:47   Ct Cervical Spine Wo Contrast  Result Date: 06/05/2018 CLINICAL DATA:  84 year old male with C-spine trauma. EXAM: CT HEAD WITHOUT CONTRAST CT CERVICAL SPINE WITHOUT CONTRAST TECHNIQUE:  Multidetector CT imaging of the head and cervical spine was performed following the standard protocol without intravenous contrast. Multiplanar CT image reconstructions of the cervical spine were also generated. COMPARISON:  Head CT dated 05/11/2016 FINDINGS: CT HEAD FINDINGS Brain: There is moderate age-related atrophy and chronic microvascular ischemic changes. There is no acute intracranial hemorrhage. No mass effect or midline shift. No extra-axial fluid collection. Vascular: No hyperdense vessel or unexpected calcification. Skull: Normal. Negative for fracture or focal lesion. Sinuses/Orbits: No acute finding. Other: None CT CERVICAL SPINE FINDINGS Alignment: No acute subluxation. There is grade 1 C7-T1 anterolisthesis. Skull base and vertebrae: No acute fracture. There is advanced osteopenia. Soft tissues and spinal canal: No prevertebral fluid or swelling. No visible canal hematoma. Disc levels: C6-C7 ankylosis. There is multilevel degenerative changes with disc space narrowing and endplate irregularity. Multilevel facet hypertrophy. Upper chest: Emphysema. Other: None IMPRESSION: 1. No acute intracranial hemorrhage. Moderate age-related atrophy and chronic microvascular ischemic changes. 2. No acute/traumatic cervical spine pathology. Multilevel degenerative changes. Electronically Signed   By: Anner Crete M.D.   On: 06/05/2018 06:47   Dg Abdomen Acute W/chest  Result Date: 06/05/2018 CLINICAL DATA:  Abdominal pain tonight. EXAM: DG ABDOMEN ACUTE W/ 1V CHEST COMPARISON:  Chest radiograph May 08, 2017 FINDINGS: Cardiac silhouette is mildly enlarged and unchanged. Ectatic calcified aorta. Chronic interstitial changes without pleural effusion or focal consolidation. No pneumothorax. Old RIGHT rib fractures. Osteopenia. Bowel gas pattern is nondilated and nonobstructive. 2.3 cm coarse calcification LEFT abdomen. No intra-abdominal mass effect. Advanced vascular calcifications. Osteopenia. Severe  lumbar spondylosis. IMPRESSION: 1. Mild cardiomegaly and chronic interstitial changes. 2. Nonspecific bowel gas pattern. LEFT abdomen calcification likely reflects nephrolithiasis. 3.  Aortic Atherosclerosis (ICD10-I70.0). Electronically Signed   By: Elon Alas M.D.   On: 06/05/2018 04:40   Ct Angio Abd/pel W And/or Wo Contrast  Result Date: 06/05/2018 CLINICAL DATA:  Syncopal episode.  Gastrointestinal bleeding. EXAM: CTA ABDOMEN AND PELVIS wITHOUT AND WITH CONTRAST TECHNIQUE: Multidetector CT imaging of the abdomen and pelvis was performed using the standard protocol during bolus administration  of intravenous contrast. Multiplanar reconstructed images and MIPs were obtained and reviewed to evaluate the vascular anatomy. CONTRAST:  7mL ISOVUE-370 IOPAMIDOL (ISOVUE-370) INJECTION 76% COMPARISON:  Abdominal ultrasound 01/06/2018. CT abdomen pelvis January 2017. FINDINGS: VASCULAR Aorta: Diffuse atherosclerosis of the aorta. Maximal diameter of the infrarenal aorta measures 3.1 cm, increased from 2.7 cm in 2017. No sign of aortic stenosis. Celiac: 20% origin stenosis. No evidence of any extravasation in that vascular territory. SMA: 20% stenosis 1 cm beyond the origin. No evidence of extravasation in that vascular territory. Renals: Single renal artery on the right. Tiny accessory upper pole renal artery on the left. Atherosclerotic disease at both renal artery origins. 50-70% stenosis on the right. 30-50% stenosis on the left. IMA: Inferior mesenteric artery shows flow. Inflow: Normal Proximal Outflow: Bilateral iliac atherosclerotic disease. No flow limiting stenosis suspected. Common femoral arteries show atherosclerosis but no stenosis greater than 30%. Veins: Normal Review of the MIP images confirms the above findings. NON-VASCULAR Lower chest: Scarring or atelectasis at the lung bases. Cardiomegaly. Hepatobiliary: No calcified gallstones. Probable cirrhosis of the liver. No focal lesion. Pancreas:  Normal Spleen: Normal Adrenals/Urinary Tract: Adrenal glands are normal. Right kidney is normal. Left kidney shows a chronic stone it in the extrarenal pelvis and UPJ region measuring up to 2.4 cm in size with chronic fullness of the left renal collecting system. Additional nonobstructing 6 mm stone in the lower pole of the left kidney. Bladder is negative. Stomach/Bowel: Liquid material within the colon. No evidence of vascular extravasation is present. There is sigmoid diverticulosis without evidence of diverticulitis. Lymphatic: No adenopathy. Reproductive: No acute finding. Other: No free fluid or air. Musculoskeletal: Extensive chronic degenerative changes of the spine. IMPRESSION: VASCULAR Atherosclerotic disease of the aorta. Maximal diameter of the infrarenal aorta is 3.1 cm. Atherosclerosis of the branch vessels as outlined above without acute relevant finding to the clinical presentation. NON-VASCULAR Liquid stool in the colon. I do not see any evidence of vascular extravasation identifiable by this exam. Cirrhosis of the liver. Sigmoid diverticulosis without evidence of diverticulitis. Chronic 2.4 cm stone in the left extra renal pelvis with chronic fullness of the left renal collecting system. Electronically Signed   By: Nelson Chimes M.D.   On: 06/05/2018 06:55    EKG: not done   Labs on Admission: I have personally reviewed the available labs and imaging studies at the time of the admission.  Pertinent labs:   Hgb 10.6 -> 8.0; 12.4 in 11/18 Lactate 6.27 -> 1.10 Glucose 209 BUN 26/Creatinine 1.43/GFR 43 Albumin 2.6 Troponin 0.03 INR 2.71 - On Coumadin  Assessment/Plan Principal Problem:   GI bleed Active Problems:   Permanent atrial fibrillation (H[facility])   Dementia with behavioral disturbance ([facility])   Alzheimer's disease (H[facility])   HTN (hypertension)   High cholesterol   Lower GI bleed -Likely diverticular, as he has h/o this in the past and it was abrupt onset painless BRBPR -GI  consulted but given his age and overall medical condition, he is likely not a good candidate for colonoscopy, EGD -He was with therapeutic INR at the time of presentation and so the rapid reversal protocol was initiated.  -His initial INR was 2.71 and has not yet been rechecked s/p FFP, vitamin K (it appears that Kcentra was not given) -Inpatient admission  -CBC q6h; transfuse for Hgb <8 -Continue to monitor for recurrent bleeding  -Empiric IV PPI treatment, although doubt upper source for bleeding  Afib on [facility] -He is rate controlled without medication -  He is no longer an appropriate candidate for anticoagulation; his wife and son are in agreement with this plan  Dementia -Continue Aricept -Family counseled about how patients with dementia often do better behaviorally while hospitalized if family members are present overnight  HTN -No longer taking medications for this issue  HLD -He is on Lipitor -It is not clear that the risks of ongoing use of this medication are worth the benefits but will defer to day team or PCP   DVT prophylaxis: SCDs Code Status:  DNR - confirmed with family Family Communication: Wife and son present throughout evaluation Disposition Plan:  Home once clinically improved Consults called: GI, PCCM  Admission status: Admit - It is my clinical opinion that admission to INPATIENT is reasonable and necessary because of the expectation that this patient will require hospital care that crosses at least 2 midnights to treat this condition based on the medical complexity of the problems presented.  Given the aforementioned information, the predictability of an adverse outcome is felt to be significant.    Karmen Bongo MD [facility]  If note is complete, please contact covering daytime or nighttime physician. [email or web address] Password [email or web address]  06/05/2018, 5:15 PM

## 2018-06-05 NOTE — Progress Notes (Signed)
MD ordered 2 units RBC to be given to Pt. However Hgb came back at 8.7. Per MD Lorin Mercy we will not transfuse. Pt stable, will continue to monitor.

## 2018-06-05 NOTE — ED Notes (Signed)
Bair hugger applied.

## 2018-06-06 DIAGNOSIS — I4891 Unspecified atrial fibrillation: Secondary | ICD-10-CM

## 2018-06-06 DIAGNOSIS — D62 Acute posthemorrhagic anemia: Secondary | ICD-10-CM

## 2018-06-06 DIAGNOSIS — F0390 Unspecified dementia without behavioral disturbance: Secondary | ICD-10-CM

## 2018-06-06 LAB — BPAM FFP
BLOOD PRODUCT EXPIRATION DATE: 201912092359
Blood Product Expiration Date: 201912092359
ISSUE DATE / TIME: 201912050801
ISSUE DATE / TIME: 201912051010
Unit Type and Rh: 6200
Unit Type and Rh: 6200

## 2018-06-06 LAB — CBC
HCT: 25.5 % — ABNORMAL LOW (ref 39.0–52.0)
HCT: 25.5 % — ABNORMAL LOW (ref 39.0–52.0)
HEMATOCRIT: 24.7 % — AB (ref 39.0–52.0)
Hemoglobin: 7.9 g/dL — ABNORMAL LOW (ref 13.0–17.0)
Hemoglobin: 7.8 g/dL — ABNORMAL LOW (ref 13.0–17.0)
Hemoglobin: 7.6 g/dL — ABNORMAL LOW (ref 13.0–17.0)
MCH: 30.7 pg (ref 26.0–34.0)
MCH: 31.1 pg (ref 26.0–34.0)
MCH: 30.8 pg (ref 26.0–34.0)
MCHC: 31 g/dL (ref 30.0–36.0)
MCHC: 30.6 g/dL (ref 30.0–36.0)
MCHC: 30.8 g/dL (ref 30.0–36.0)
MCV: 99.2 fL (ref 80.0–100.0)
MCV: 101.6 fL — ABNORMAL HIGH (ref 80.0–100.0)
MCV: 100 fL (ref 80.0–100.0)
Platelets: 151 10*3/uL (ref 150–400)
Platelets: 156 10*3/uL (ref 150–400)
Platelets: 164 10*3/uL (ref 150–400)
RBC: 2.57 MIL/uL — ABNORMAL LOW (ref 4.22–5.81)
RBC: 2.51 MIL/uL — ABNORMAL LOW (ref 4.22–5.81)
RBC: 2.47 MIL/uL — ABNORMAL LOW (ref 4.22–5.81)
RDW: 14.1 % (ref 11.5–15.5)
RDW: 14.3 % (ref 11.5–15.5)
RDW: 14.1 % (ref 11.5–15.5)
WBC: 5 10*3/uL (ref 4.0–10.5)
WBC: 4.6 10*3/uL (ref 4.0–10.5)
WBC: 5.9 10*3/uL (ref 4.0–10.5)
nRBC: 0 % (ref 0.0–0.2)
nRBC: 0 % (ref 0.0–0.2)
nRBC: 0 % (ref 0.0–0.2)

## 2018-06-06 LAB — PREPARE FRESH FROZEN PLASMA
UNIT DIVISION: 0
Unit division: 0

## 2018-06-06 LAB — BASIC METABOLIC PANEL
Anion gap: 6 (ref 5–15)
BUN: 22 mg/dL (ref 8–23)
CO2: 23 mmol/L (ref 22–32)
Calcium: 8.1 mg/dL — ABNORMAL LOW (ref 8.9–10.3)
Chloride: 112 mmol/L — ABNORMAL HIGH (ref 98–111)
Creatinine, Ser: 1.23 mg/dL (ref 0.61–1.24)
GFR calc Af Amer: 60 mL/min — ABNORMAL LOW (ref >60)
GFR calc non Af Amer: 51 mL/min — ABNORMAL LOW (ref >60)
Glucose, Bld: 90 mg/dL (ref 70–99)
Potassium: 3.8 mmol/L (ref 3.5–5.1)
Sodium: 141 mmol/L (ref 135–145)

## 2018-06-06 LAB — PREPARE RBC (CROSSMATCH)

## 2018-06-06 MED ORDER — SODIUM CHLORIDE 0.9% IV SOLUTION
Freq: Once | INTRAVENOUS | Status: AC
  Administered 2018-06-06: 21:00:00 via INTRAVENOUS

## 2018-06-06 MED ORDER — ACETAMINOPHEN 325 MG PO TABS
650.0000 mg | ORAL_TABLET | Freq: Once | ORAL | Status: AC
  Administered 2018-06-06: 650 mg via ORAL
  Filled 2018-06-06: qty 2

## 2018-06-06 MED ORDER — FUROSEMIDE 10 MG/ML IJ SOLN
20.0000 mg | Freq: Once | INTRAMUSCULAR | Status: AC
  Administered 2018-06-06: 20 mg via INTRAVENOUS
  Filled 2018-06-06: qty 2

## 2018-06-06 MED ORDER — PANTOPRAZOLE SODIUM 40 MG PO TBEC: 40.0000 mg | DELAYED_RELEASE_TABLET | Freq: Every day | ORAL | Status: DC

## 2018-06-06 NOTE — Evaluation (Signed)
Physical Therapy Evaluation Patient Details Name: [name] [name] MRN: [id number] DOB: Jun 17, 1926 Today's Date: 06/06/2018   History of Present Illness  Patient is a 84 y.o. male with history of atrial fibrillation on Coumadin, history of third-degree AV block requiring PPM implantation, dementia, hypertension, dyslipidemia presenting with diverticular bleeding with acute blood loss anemia  Clinical Impression  Patient presents with decreased mobility due to decreased balance with postural abnormality and high risk for falls.  Did not seem any safer with walker due to anterior lean and increased speed with walker too far out in front.  Feel safest for home in typical environment with spouse support and follow up HHPT.     Follow Up Recommendations Home health PT;Supervision for mobility/OOB    Equipment Recommendations  None recommended by PT    Recommendations for Other Services       Precautions / Restrictions Precautions Precautions: Fall      Mobility  Bed Mobility Overal bed mobility: Needs Assistance Bed Mobility: Supine to Sit;Sit to Supine     Supine to sit: Min guard;HOB elevated Sit to supine: Min guard   General bed mobility comments: assist for safety and balance  Transfers Overall transfer level: Needs assistance Equipment used: None Transfers: Sit to/from Stand Sit to Stand: Min assist         General transfer comment: assist for balance as posterior initially upon standing  Ambulation/Gait Ambulation/Gait assistance: Min assist;Min guard Gait Distance (Feet): 150 Feet(& 70) Assistive device: None;Rolling walker (2 wheeled) Gait Pattern/deviations: Step-through pattern;Shuffle;Staggering right;Trunk flexed;Wide base of support     General Gait Details: initially hugging the wall to hold the rail and when without UE support staggering with feet tripping due to shuffling; with walker, initially more smooth gait, then stretched out with walker too far  forward and anterior weight shift with increased speed.   Stairs            Wheelchair Mobility    Modified Rankin (Stroke Patients Only)       Balance Overall balance assessment: Needs assistance   Sitting balance-Leahy Scale: Fair Sitting balance - Comments: due to kyphosis leans back sitting EOB on elbows to visualize his visitor     Standing balance-Leahy Scale: Fair Standing balance comment: static standing without UE support, holding rail, furniture, walker for ambulation                             Pertinent Vitals/Pain Pain Assessment: No/denies pain    Home Living Family/patient expects to be discharged to:: Private residence Living Arrangements: Spouse/significant other   Type of Home: House Home Access: Stairs to enter Entrance Stairs-Rails: None Entrance Stairs-Number of Steps: 2 & 1 on back Home Layout: Two level Home Equipment: Grab bars - tub/shower      Prior Function Level of Independence: Independent               Hand Dominance   Dominant Hand: Right    Extremity/Trunk Assessment   Upper Extremity Assessment Upper Extremity Assessment: Overall WFL for tasks assessed    Lower Extremity Assessment Lower Extremity Assessment: Generalized weakness    Cervical / Trunk Assessment Cervical / Trunk Assessment: Kyphotic;Other exceptions Cervical / Trunk Exceptions: severe kyphosis with forward head   Communication   Communication: No difficulties  Cognition Arousal/Alertness: Awake/alert Behavior During Therapy: WFL for tasks assessed/performed Overall Cognitive Status: Within Functional Limits for tasks assessed  General Comments General comments (skin integrity, edema, etc.): wife in the room and reports would not use walker at home if he had one    Exercises     Assessment/Plan    PT Assessment Patient needs continued PT services  PT Problem List  Decreased balance;Decreased safety awareness;Decreased mobility;Decreased knowledge of use of DME       PT Treatment Interventions DME instruction;Therapeutic activities;Therapeutic exercise;Gait training;Balance training;Stair training;Functional mobility training;Patient/family education    PT Goals (Current goals can be found in the Care Plan section)  Acute Rehab PT Goals Patient Stated Goal: to return home PT Goal Formulation: With patient/family Time For Goal Achievement: 06/09/18 Potential to Achieve Goals: Good    Frequency Min 3X/week   Barriers to discharge        Co-evaluation               AM-PAC PT "6 Clicks" Mobility  Outcome Measure Help needed turning from your back to your side while in a flat bed without using bedrails?: A Little Help needed moving from lying on your back to sitting on the side of a flat bed without using bedrails?: A Little Help needed moving to and from a bed to a chair (including a wheelchair)?: A Little Help needed standing up from a chair using your arms (e.g., wheelchair or bedside chair)?: A Little Help needed to walk in hospital room?: A Little Help needed climbing 3-5 steps with a railing? : A Little 6 Click Score: 18    End of Session Equipment Utilized During Treatment: Gait belt Activity Tolerance: Patient tolerated treatment well Patient left: in bed;with call bell/phone within reach;with family/visitor present   PT Visit Diagnosis: Other abnormalities of gait and mobility (R26.89)    Time: 0102-7253 PT Time Calculation (min) (ACUTE ONLY): 18 min   Charges:   PT Evaluation $PT Eval Low Complexity: [name], [name] [phone] 06/06/2018   Reginia Naas 06/06/2018, 4:24 PM

## 2018-06-06 NOTE — Progress Notes (Signed)
PROGRESS NOTE        PATIENT DETAILS Name: [name] [name] Age: 84 y.o. Sex: male Date of Birth: [birth year] Admit Date: 06/05/2018 Admitting Physician Karmen Bongo, MD ZRA:[id number], Christiane Ha, MD  Brief Narrative: Patient is a 84 y.o. male with history of atrial fibrillation on Coumadin, history of third-degree AV block requiring PPM implantation, dementia, hypertension, dyslipidemia presenting with diverticular bleeding with acute blood loss anemia.  See below for further details  Subjective: Nursing staff-1 bloody stool overnight-and one stool that was mostly brown with a little bit of blood this morning.  Assessment/Plan: Lower GI bleeding (likely diverticular) with acute blood loss anemia: Bleeding seems to have slowed down-some minimal bleeding this morning.  Has required 1 unit of PRBC so far.  Hemoglobin low but holding steady-repeat CBC later today.  Has a history of CAD-and elderly-and if hemoglobin continues to downtrend-may require a second unit of PRBC.  GI following-if bleeding reoccurs or gets worse-probably will require colonoscopy.  Atrial fibrillation: Paced rhythm on telemetry-was on Coumadin-but reversed with FFP and vitamin K on admission.  INR now therapeutic.  Continue to monitor off Coumadin due to above.CHADSVasc 4 (age 57, CAD, LV dysf).  Hypertension: Appears to be controlled.  Dyslipidemia: Continue statin  BPH: Continue finasteride  Dementia: Appears at baseline-able to answer most of my questions appropriately-continue Aricept  Chronic systolic heart failure: Has a history of mild systolic heart failure-but recent echocardiogram showed normalization of EF.  Volume status is currently stable.  Follow.  Second/third degree AV block requiring permanent pacemaker implantation  CAD status post CABG: No anginal symptoms  DVT Prophylaxis: SCD's  Code Status: DNR  Family Communication: None at bedside  Disposition Plan: Remain  inpatient-suspect requires 1 additional day of monitoring to see if he has recurrent GI bleeding-if no further evidence of bleeding-should be stable to be discharged home-may require home health on discharge.  Antimicrobial agents: Anti-infectives (From admission, onward)   Start     Dose/Rate Route Frequency Ordered Stop   06/05/18 0400  ceFEPIme (MAXIPIME) 2 g in sodium chloride 0.9 % 100 mL IVPB     2 g 200 mL/hr over 30 Minutes Intravenous  Once 06/05/18 0348 06/05/18 0636   06/05/18 0400  metroNIDAZOLE (FLAGYL) IVPB 500 mg  Status:  Discontinued     500 mg 100 mL/hr over 60 Minutes Intravenous Every 8 hours 06/05/18 0348 06/05/18 1048   06/05/18 0400  vancomycin (VANCOCIN) IVPB 1000 mg/200 mL premix  Status:  Discontinued     1,000 mg 200 mL/hr over 60 Minutes Intravenous  Once 06/05/18 0348 06/05/18 0349   06/05/18 0400  vancomycin (VANCOCIN) 1,500 mg in sodium chloride 0.9 % 500 mL IVPB     1,500 mg 250 mL/hr over 120 Minutes Intravenous  Once 06/05/18 0350 06/05/18 5456      Procedures: None  CONSULTS:  GI  Time spent: 25- minutes-Greater than 50% of this time was spent in counseling, explanation of diagnosis, planning of further management, and coordination of care.  MEDICATIONS: Scheduled Meds: . sodium chloride   Intravenous Once  . sodium chloride   Intravenous Once  . atorvastatin  40 mg Oral QPM  . donepezil  5 mg Oral BID  . finasteride  5 mg Oral QPM  . pantoprazole (PROTONIX) IV  40 mg Intravenous Q12H  . sodium chloride flush  3 mL  Intravenous Q12H   Continuous Infusions: . sodium chloride 100 mL/hr at 06/06/18 0248   PRN Meds:.acetaminophen **OR** acetaminophen, ondansetron **OR** ondansetron (ZOFRAN) IV   PHYSICAL EXAM: Vital signs: Vitals:   06/06/18 0112 06/06/18 0512 06/06/18 0554 06/06/18 0911  BP: (!) 105/55 (!) 93/55 110/61 119/67  Pulse: 67 67 63 84  Resp: (!) 22 14 16 16   Temp:   97.7 F (36.5 C)   TempSrc:   Oral   SpO2: 97% 98% 99%  98%  Weight:      Height:       Filed Weights   06/05/18 0322  Weight: 77.1 kg   Body mass index is 23.06 kg/m.   General appearance :Awake, alert, not in any distress.  HEENT: Atraumatic and Normocephalic Neck: supple Resp:Good air entry bilaterally, no added sounds  CVS: S1 S2 regular GI: Bowel sounds present, Non tender and not distended with no gaurding, rigidity or rebound. Extremities: B/L Lower Ext shows no edema, both legs are warm to touch Neurology: Nonfocal Musculoskeletal:No digital cyanosis Skin:No Rash, warm and dry Wounds:N/A  I have personally reviewed following labs and imaging studies  LABORATORY DATA: CBC: Recent Labs  Lab 06/05/18 0339  06/05/18 0741 06/05/18 1626 06/05/18 2319 06/06/18 0605 06/06/18 1018  WBC 12.8*  --  9.0 7.6 6.1 5.0 4.6  NEUTROABS 8.2*  --   --   --   --   --   --   HGB 10.6*   < > 8.0* 8.7* 7.7* 7.9* 7.8*  HCT 34.7*   < > 26.4* 27.5* 24.8* 25.5* 25.5*  MCV 102.1*  --  102.7* 99.3 99.2 99.2 101.6*  PLT 216  --  174 165 155 151 156   < > = values in this interval not displayed.    Basic Metabolic Panel: Recent Labs  Lab 06/05/18 0339 06/05/18 0345 06/06/18 0605  NA 140 141 141  K 4.0 4.0 3.8  CL 103 106 112*  CO2 22  --  23  GLUCOSE 209* 202* 90  BUN 26* 27* 22  CREATININE 1.43* 1.30* 1.23  CALCIUM 8.8*  --  8.1*    GFR: Estimated Creatinine Clearance: 43.5 mL/min (by C-G formula based on SCr of 1.23 mg/dL).  Liver Function Tests: Recent Labs  Lab 06/05/18 0339  AST 30  ALT 15  ALKPHOS 94  BILITOT 0.6  PROT 6.9  ALBUMIN 2.6*   No results for input(s): LIPASE, AMYLASE in the last 168 hours. No results for input(s): AMMONIA in the last 168 hours.  Coagulation Profile: Recent Labs  Lab 06/05/18 0339 06/05/18 1626  INR 2.71 1.51    Cardiac Enzymes: No results for input(s): CKTOTAL, CKMB, CKMBINDEX, TROPONINI in the last 168 hours.  BNP (last 3 results) No results for input(s): PROBNP in the  last 8760 hours.  HbA1C: No results for input(s): HGBA1C in the last 72 hours.  CBG: No results for input(s): GLUCAP in the last 168 hours.  Lipid Profile: No results for input(s): CHOL, HDL, LDLCALC, TRIG, CHOLHDL, LDLDIRECT in the last 72 hours.  Thyroid Function Tests: No results for input(s): TSH, T4TOTAL, FREET4, T3FREE, THYROIDAB in the last 72 hours.  Anemia Panel: No results for input(s): VITAMINB12, FOLATE, FERRITIN, TIBC, IRON, RETICCTPCT in the last 72 hours.  Urine analysis:    Component Value Date/Time   COLORURINE YELLOW 11/21/2009 [place] 11/21/2009 1617   LABSPEC 1.017 11/21/2009 1617   PHURINE 6.0 11/21/2009 1617   GLUCOSEU NEGATIVE 11/21/2009 1617  HGBUR SMALL (A) 11/21/2009 1617   BILIRUBINUR NEGATIVE 11/21/2009 1617   KETONESUR NEGATIVE 11/21/2009 1617   PROTEINUR NEGATIVE 11/21/2009 1617   UROBILINOGEN 0.2 11/21/2009 1617   NITRITE NEGATIVE 11/21/2009 1617   LEUKOCYTESUR NEGATIVE 11/21/2009 1617    Sepsis Labs: Lactic Acid, Venous    Component Value Date/Time   LATICACIDVEN 1.10 06/05/2018 0751    MICROBIOLOGY: Recent Results (from the past 240 hour(s))  Blood culture (routine x 2)     Status: None (Preliminary result)   Collection Time: 06/05/18  3:40 AM  Result Value Ref Range Status   Specimen Description BLOOD RIGHT ANTECUBITAL  Final   Special Requests   Final    BOTTLES DRAWN AEROBIC AND ANAEROBIC Blood Culture results may not be optimal due to an inadequate volume of blood received in culture bottles   Culture   Final    NO GROWTH 1 DAY Performed at [facility], [place] [address], [place], [place] [address]    Report Status PENDING  Incomplete  Blood culture (routine x 2)     Status: None (Preliminary result)   Collection Time: 06/05/18  4:09 AM  Result Value Ref Range Status   Specimen Description BLOOD RIGHT FOREARM  Final   Special Requests   Final    BOTTLES DRAWN AEROBIC AND ANAEROBIC Blood Culture  adequate volume   Culture   Final    NO GROWTH 1 DAY Performed at [facility], [place] [address], [place], [place] [address]    Report Status PENDING  Incomplete    RADIOLOGY STUDIES/RESULTS: Ct Head Wo Contrast  Result Date: 06/05/2018 CLINICAL DATA:  84 year old male with C-spine trauma. EXAM: CT HEAD WITHOUT CONTRAST CT CERVICAL SPINE WITHOUT CONTRAST TECHNIQUE: Multidetector CT imaging of the head and cervical spine was performed following the standard protocol without intravenous contrast. Multiplanar CT image reconstructions of the cervical spine were also generated. COMPARISON:  Head CT dated 05/11/2016 FINDINGS: CT HEAD FINDINGS Brain: There is moderate age-related atrophy and chronic microvascular ischemic changes. There is no acute intracranial hemorrhage. No mass effect or midline shift. No extra-axial fluid collection. Vascular: No hyperdense vessel or unexpected calcification. Skull: Normal. Negative for fracture or focal lesion. Sinuses/Orbits: No acute finding. Other: None CT CERVICAL SPINE FINDINGS Alignment: No acute subluxation. There is grade 1 C7-T1 anterolisthesis. Skull base and vertebrae: No acute fracture. There is advanced osteopenia. Soft tissues and spinal canal: No prevertebral fluid or swelling. No visible canal hematoma. Disc levels: C6-C7 ankylosis. There is multilevel degenerative changes with disc space narrowing and endplate irregularity. Multilevel facet hypertrophy. Upper chest: Emphysema. Other: None IMPRESSION: 1. No acute intracranial hemorrhage. Moderate age-related atrophy and chronic microvascular ischemic changes. 2. No acute/traumatic cervical spine pathology. Multilevel degenerative changes. Electronically Signed   By: Anner Crete M.D.   On: 06/05/2018 06:47   Ct Cervical Spine Wo Contrast  Result Date: 06/05/2018 CLINICAL DATA:  84 year old male with C-spine trauma. EXAM: CT HEAD WITHOUT CONTRAST CT CERVICAL SPINE WITHOUT CONTRAST TECHNIQUE:  Multidetector CT imaging of the head and cervical spine was performed following the standard protocol without intravenous contrast. Multiplanar CT image reconstructions of the cervical spine were also generated. COMPARISON:  Head CT dated 05/11/2016 FINDINGS: CT HEAD FINDINGS Brain: There is moderate age-related atrophy and chronic microvascular ischemic changes. There is no acute intracranial hemorrhage. No mass effect or midline shift. No extra-axial fluid collection. Vascular: No hyperdense vessel or unexpected calcification. Skull: Normal. Negative for fracture or focal lesion. Sinuses/Orbits: No  acute finding. Other: None CT CERVICAL SPINE FINDINGS Alignment: No acute subluxation. There is grade 1 C7-T1 anterolisthesis. Skull base and vertebrae: No acute fracture. There is advanced osteopenia. Soft tissues and spinal canal: No prevertebral fluid or swelling. No visible canal hematoma. Disc levels: C6-C7 ankylosis. There is multilevel degenerative changes with disc space narrowing and endplate irregularity. Multilevel facet hypertrophy. Upper chest: Emphysema. Other: None IMPRESSION: 1. No acute intracranial hemorrhage. Moderate age-related atrophy and chronic microvascular ischemic changes. 2. No acute/traumatic cervical spine pathology. Multilevel degenerative changes. Electronically Signed   By: Anner Crete M.D.   On: 06/05/2018 06:47   Dg Abdomen Acute W/chest  Result Date: 06/05/2018 CLINICAL DATA:  Abdominal pain tonight. EXAM: DG ABDOMEN ACUTE W/ 1V CHEST COMPARISON:  Chest radiograph May 08, 2017 FINDINGS: Cardiac silhouette is mildly enlarged and unchanged. Ectatic calcified aorta. Chronic interstitial changes without pleural effusion or focal consolidation. No pneumothorax. Old RIGHT rib fractures. Osteopenia. Bowel gas pattern is nondilated and nonobstructive. 2.3 cm coarse calcification LEFT abdomen. No intra-abdominal mass effect. Advanced vascular calcifications. Osteopenia. Severe  lumbar spondylosis. IMPRESSION: 1. Mild cardiomegaly and chronic interstitial changes. 2. Nonspecific bowel gas pattern. LEFT abdomen calcification likely reflects nephrolithiasis. 3.  Aortic Atherosclerosis (ICD10-I70.0). Electronically Signed   By: Elon Alas M.D.   On: 06/05/2018 04:40   Ct Angio Abd/pel W And/or Wo Contrast  Result Date: 06/05/2018 CLINICAL DATA:  Syncopal episode.  Gastrointestinal bleeding. EXAM: CTA ABDOMEN AND PELVIS wITHOUT AND WITH CONTRAST TECHNIQUE: Multidetector CT imaging of the abdomen and pelvis was performed using the standard protocol during bolus administration of intravenous contrast. Multiplanar reconstructed images and MIPs were obtained and reviewed to evaluate the vascular anatomy. CONTRAST:  33mL ISOVUE-370 IOPAMIDOL (ISOVUE-370) INJECTION 76% COMPARISON:  Abdominal ultrasound 01/06/2018. CT abdomen pelvis January 2017. FINDINGS: VASCULAR Aorta: Diffuse atherosclerosis of the aorta. Maximal diameter of the infrarenal aorta measures 3.1 cm, increased from 2.7 cm in 2017. No sign of aortic stenosis. Celiac: 20% origin stenosis. No evidence of any extravasation in that vascular territory. SMA: 20% stenosis 1 cm beyond the origin. No evidence of extravasation in that vascular territory. Renals: Single renal artery on the right. Tiny accessory upper pole renal artery on the left. Atherosclerotic disease at both renal artery origins. 50-70% stenosis on the right. 30-50% stenosis on the left. IMA: Inferior mesenteric artery shows flow. Inflow: Normal Proximal Outflow: Bilateral iliac atherosclerotic disease. No flow limiting stenosis suspected. Common femoral arteries show atherosclerosis but no stenosis greater than 30%. Veins: Normal Review of the MIP images confirms the above findings. NON-VASCULAR Lower chest: Scarring or atelectasis at the lung bases. Cardiomegaly. Hepatobiliary: No calcified gallstones. Probable cirrhosis of the liver. No focal lesion. Pancreas:  Normal Spleen: Normal Adrenals/Urinary Tract: Adrenal glands are normal. Right kidney is normal. Left kidney shows a chronic stone it in the extrarenal pelvis and UPJ region measuring up to 2.4 cm in size with chronic fullness of the left renal collecting system. Additional nonobstructing 6 mm stone in the lower pole of the left kidney. Bladder is negative. Stomach/Bowel: Liquid material within the colon. No evidence of vascular extravasation is present. There is sigmoid diverticulosis without evidence of diverticulitis. Lymphatic: No adenopathy. Reproductive: No acute finding. Other: No free fluid or air. Musculoskeletal: Extensive chronic degenerative changes of the spine. IMPRESSION: VASCULAR Atherosclerotic disease of the aorta. Maximal diameter of the infrarenal aorta is 3.1 cm. Atherosclerosis of the branch vessels as outlined above without acute relevant finding to the clinical presentation. NON-VASCULAR Liquid stool in  the colon. I do not see any evidence of vascular extravasation identifiable by this exam. Cirrhosis of the liver. Sigmoid diverticulosis without evidence of diverticulitis. Chronic 2.4 cm stone in the left extra renal pelvis with chronic fullness of the left renal collecting system. Electronically Signed   By: Nelson Chimes M.D.   On: 06/05/2018 06:55     LOS: 1 day   Oren Binet, MD  [facility]  If 7PM-7AM, please contact night-coverage  Please page via [email or web address] [email or web address]-click on MD name and type text message  06/06/2018, 12:05 PM

## 2018-06-06 NOTE — Care Management Note (Signed)
Case Management Note  Patient Details  Name: [name] [name] MRN: [id number] Date of Birth: Jun 12, 1926  Subjective/Objective:   Presents with diverticular bleeding with acute blood loss anemia. Hx of atrial fibrillation on Coumadin, history of third-degree AV block requiring PPM implantation, dementia, hypertension, dyslipidemia. Resides with wife. States PTA independent with ADL's , no DME usage.           [name] [name] (Spouse)     [phone]      PCP:  Action/Plan: PT evaluation pending....NCM following for TOC needs.  Expected Discharge Date:  ([place])               Expected Discharge Plan:     In-House Referral:  Clinical Social Work, following for potential SNF placement  Discharge planning Services  CM Consult  Post Acute Care Choice:    Choice offered to:  Spouse, Patient(Medicare. gov, home health agency list provided to pt/wife if needed for home health services and copy left in shadow chart per pt's zip code [phone])  DME Arranged:    DME Agency:     HH Arranged:    [place] Agency:     Status of Service:  In process, will continue to follow  If discussed at Long Length of Stay Meetings, dates discussed:    Additional Comments:  Sharin Mons, RN 06/06/2018, 1:07 PM

## 2018-06-06 NOTE — Progress Notes (Signed)
[facility] Gastroenterology Progress Note  [name] [name] 84 y.o. Jun 14, 1926   Subjective: Patient reports having rectal bleeding overnight but stools not documented in chart and nurse reports that wife saw one bloody stool overnight. Denies abdominal pain. Wife not in room during my evaluation.  Objective: Vital signs: Vitals:   06/06/18 0512 06/06/18 0554  BP: (!) 93/55 110/61  Pulse: 67 63  Resp: 14 16  Temp:  97.7 F (36.5 C)  SpO2: 98% 99%    Physical Exam: Gen: lethargic, elderly, thin, no acute distress  HEENT: anicteric sclera CV: RRR Chest: CTA B Abd: soft, nontender, nondistended, +BS Ext: no edema  Lab Results: Recent Labs    06/05/18 0339 06/05/18 0345 06/06/18 0605  NA 140 141 141  K 4.0 4.0 3.8  CL 103 106 112*  CO2 22  --  23  GLUCOSE 209* 202* 90  BUN 26* 27* 22  CREATININE 1.43* 1.30* 1.23  CALCIUM 8.8*  --  8.1*   Recent Labs    06/05/18 0339  AST 30  ALT 15  ALKPHOS 94  BILITOT 0.6  PROT 6.9  ALBUMIN 2.6*   Recent Labs    06/05/18 0339  06/05/18 2319 06/06/18 0605  WBC 12.8*   < > 6.1 5.0  NEUTROABS 8.2*  --   --   --   HGB 10.6*   < > 7.7* 7.9*  HCT 34.7*   < > 24.8* 25.5*  MCV 102.1*   < > 99.2 99.2  PLT 216   < > 155 151   < > = values in this interval not displayed.      Assessment/Plan: Rectal bleeding likely diverticular. Hgb 7.9. If bleeding documented to be ongoing, then may need an updated colonoscopy otherwise would manage conservatively. Clear liquid diet. Dr. Oletta Lamas to f/u tomorrow.   [name] 06/06/2018, 8:45 AM  Questions please call [phone] ID: [name] [name], male   DOB: February 09, 1926, 84 y.o.   MRN: [id number]

## 2018-06-07 ENCOUNTER — Other Ambulatory Visit: Payer: Self-pay

## 2018-06-07 ENCOUNTER — Encounter (HOSPITAL_COMMUNITY): Payer: Self-pay | Admitting: Oncology

## 2018-06-07 ENCOUNTER — Inpatient Hospital Stay (HOSPITAL_COMMUNITY)
Admission: EM | Admit: 2018-06-07 | Discharge: 2018-06-11 | DRG: 378 | Disposition: A | Discharge status: Home Health | Payer: Medicare Other | Attending: Internal Medicine | Admitting: Internal Medicine

## 2018-06-07 DIAGNOSIS — G309 Alzheimer's disease, unspecified: Secondary | ICD-10-CM | POA: Diagnosis not present

## 2018-06-07 DIAGNOSIS — D62 Acute posthemorrhagic anemia: Secondary | ICD-10-CM | POA: Diagnosis not present

## 2018-06-07 DIAGNOSIS — K922 Gastrointestinal hemorrhage, unspecified: Secondary | ICD-10-CM

## 2018-06-07 DIAGNOSIS — Z825 Family history of asthma and other chronic lower respiratory diseases: Secondary | ICD-10-CM

## 2018-06-07 DIAGNOSIS — Z87891 Personal history of nicotine dependence: Secondary | ICD-10-CM

## 2018-06-07 DIAGNOSIS — Z950 Presence of cardiac pacemaker: Secondary | ICD-10-CM

## 2018-06-07 DIAGNOSIS — Z823 Family history of stroke: Secondary | ICD-10-CM

## 2018-06-07 DIAGNOSIS — Z7901 Long term (current) use of anticoagulants: Secondary | ICD-10-CM

## 2018-06-07 DIAGNOSIS — F0280 Dementia in other diseases classified elsewhere without behavioral disturbance: Secondary | ICD-10-CM | POA: Diagnosis present

## 2018-06-07 DIAGNOSIS — E785 Hyperlipidemia, unspecified: Secondary | ICD-10-CM | POA: Diagnosis present

## 2018-06-07 DIAGNOSIS — Z818 Family history of other mental and behavioral disorders: Secondary | ICD-10-CM

## 2018-06-07 DIAGNOSIS — I4821 Permanent atrial fibrillation: Secondary | ICD-10-CM | POA: Diagnosis not present

## 2018-06-07 DIAGNOSIS — N200 Calculus of kidney: Secondary | ICD-10-CM | POA: Diagnosis present

## 2018-06-07 DIAGNOSIS — Z9089 Acquired absence of other organs: Secondary | ICD-10-CM

## 2018-06-07 DIAGNOSIS — N401 Enlarged prostate with lower urinary tract symptoms: Secondary | ICD-10-CM | POA: Diagnosis present

## 2018-06-07 DIAGNOSIS — Z9049 Acquired absence of other specified parts of digestive tract: Secondary | ICD-10-CM

## 2018-06-07 DIAGNOSIS — Z66 Do not resuscitate: Secondary | ICD-10-CM | POA: Diagnosis present

## 2018-06-07 DIAGNOSIS — K625 Hemorrhage of anus and rectum: Secondary | ICD-10-CM | POA: Diagnosis not present

## 2018-06-07 DIAGNOSIS — K5731 Diverticulosis of large intestine without perforation or abscess with bleeding: Secondary | ICD-10-CM | POA: Diagnosis not present

## 2018-06-07 DIAGNOSIS — E7800 Pure hypercholesterolemia, unspecified: Secondary | ICD-10-CM | POA: Diagnosis present

## 2018-06-07 DIAGNOSIS — Z9181 History of falling: Secondary | ICD-10-CM

## 2018-06-07 DIAGNOSIS — K7460 Unspecified cirrhosis of liver: Secondary | ICD-10-CM | POA: Diagnosis present

## 2018-06-07 DIAGNOSIS — Z9861 Coronary angioplasty status: Secondary | ICD-10-CM

## 2018-06-07 DIAGNOSIS — I714 Abdominal aortic aneurysm, without rupture, unspecified: Secondary | ICD-10-CM | POA: Diagnosis present

## 2018-06-07 DIAGNOSIS — I2510 Atherosclerotic heart disease of native coronary artery without angina pectoris: Secondary | ICD-10-CM | POA: Diagnosis present

## 2018-06-07 DIAGNOSIS — Z8049 Family history of malignant neoplasm of other genital organs: Secondary | ICD-10-CM

## 2018-06-07 DIAGNOSIS — Z79899 Other long term (current) drug therapy: Secondary | ICD-10-CM

## 2018-06-07 DIAGNOSIS — Z888 Allergy status to other drugs, medicaments and biological substances status: Secondary | ICD-10-CM

## 2018-06-07 DIAGNOSIS — Z951 Presence of aortocoronary bypass graft: Secondary | ICD-10-CM

## 2018-06-07 LAB — CBC
HCT: 27.1 % — ABNORMAL LOW (ref 39.0–52.0)
HEMOGLOBIN: 8.7 g/dL — AB (ref 13.0–17.0)
MCH: 31.2 pg (ref 26.0–34.0)
MCHC: 32.1 g/dL (ref 30.0–36.0)
MCV: 97.1 fL (ref 80.0–100.0)
Platelets: 162 10*3/uL (ref 150–400)
RBC: 2.79 MIL/uL — ABNORMAL LOW (ref 4.22–5.81)
RDW: 15.1 % (ref 11.5–15.5)
WBC: 5.4 10*3/uL (ref 4.0–10.5)
nRBC: 0 % (ref 0.0–0.2)

## 2018-06-07 MED ORDER — SODIUM CHLORIDE 0.9 % IV BOLUS
1000.0000 mL | Freq: Once | INTRAVENOUS | Status: AC
  Administered 2018-06-08: 1000 mL via INTRAVENOUS

## 2018-06-07 NOTE — ED Triage Notes (Signed)
Pt bib [facility] d/t GI bleed.  Pt admitted for same and d/c'd today.  Per EMS pt's wife called them out d/t pt having one episode of BRBPR.  Pt's only c/o is being cold.  Pt w/ hx of a. Fib has been taken off coumadin x 3 days d/t bleeding.

## 2018-06-07 NOTE — Care Management Note (Signed)
Case Management Note  Patient Details  Name: WALDEMAR SIEGEL MRN: [id number] Date of Birth: 06/06/1926  Subjective/Objective:                    Action/Plan:  Spoke w patient's wife, she prefers [facility], referral made to [place]. NO other CM needs identified.   Expected Discharge Date:  06/07/18               Expected Discharge Plan:  [place]  In-House Referral:  Clinical Social Work  Discharge planning Services  CM Consult  Post Acute Care Choice:    Choice offered to:  Spouse, Patient(Medicare. gov, home health agency list provided to pt/wife if needed for home health services and copy left in shadow chart per pt's zip code [phone])  DME Arranged:    DME Agency:     [place]:  PT [place] Agency:  [place]  Status of Service:  Completed, signed off  If discussed at [place] of Stay Meetings, dates discussed:    Additional Comments:  Carles Collet, RN 06/07/2018, 9:07 AM

## 2018-06-07 NOTE — Discharge Summary (Signed)
PATIENT DETAILS Name: [name] [name] Age: 84 y.o. Sex: male Date of Birth: April 10, 1926 MRN: [id number]. Admitting Physician: Karmen Bongo, MD [id number], Christiane Ha, MD  Admit Date: 06/05/2018 Discharge date: 06/07/2018  Recommendations for Outpatient Follow-up:  1. Follow up with PCP in 1-2 weeks 2. Please obtain CBC this coming Monday 3. Coumadin on hold for now  Admitted From:  Home  Disposition: Home with home health services   Rolfe:  Yes  Equipment/Devices: None  Discharge Condition: Stable  CODE STATUS:  DNR  Diet recommendation:  Heart Healthy   Brief Summary: See H&P, Labs, Consult and Test reports for all details in brief, Patient is a 84 y.o. male with history of atrial fibrillation on Coumadin, history of third-degree AV block requiring PPM implantation, dementia, hypertension, dyslipidemia presenting with diverticular bleeding with acute blood loss anemia.  See below for further details  Brief Hospital Course: Lower GI bleeding (likely diverticular) with acute blood loss anemia: Bleeding seems to have resolved-no melena for almost 2 days.  Required 2 units of PRBC.  Hemoglobin stable.  Evaluated by GI-no endoscopic evaluation felt to be needed.  Clinically improved and stable for discharge.  Patient instructed to follow with his primary care practitioner this coming Monday for repeat CBC.    Atrial fibrillation: Paced rhythm on telemetry-was on Coumadin-but reversed with FFP and vitamin K on admission.  INR now therapeutic.  Coumadin has been held since admission-due to advanced age-frailty-and significant GI bleeding requiring blood transfusion-we will continue to hold Coumadin-I did speak with his primary cardiologist on 12/6 (Dr. Croitoru)-agreed to hold Coumadin-patient to follow-up with his primary cardiologist in the next 1 week or so-and decide whether it is feasible to continue Coumadin long-term.   Extensive discussion with patient's spouse over  the phone regarding risks/benefits of anticoagulation held-she understands the difficult situation-and is agreeable to hold Coumadin until seen by cardiology.  ?  CT evidence of liver cirrhosis: Defer further work-up to the discretion of PCP-may be best to let things be given patient's advanced age and frailty.    Hypertension: Appears to be controlled.  Dyslipidemia: Continue statin  BPH: Continue finasteride  Dementia: Appears at baseline-able to answer most of my questions appropriately-continue Aricept  Chronic systolic heart failure: Has a history of mild systolic heart failure-but recent echocardiogram showed normalization of EF.  Volume status is currently stable.  Follow.  Second/third degree AV block requiring permanent pacemaker implantation  CAD status post CABG: No anginal symptoms  Procedures/Studies: None  Discharge Diagnoses:  Principal Problem:   GI bleed Active Problems:   Permanent atrial fibrillation (HCC)   Dementia with behavioral disturbance (HCC)   Alzheimer's disease ([place])   HTN (hypertension)   High cholesterol   Discharge Instructions:  Activity:  As tolerated with Full fall precautions use walker/cane & assistance as needed   Discharge Instructions    Diet - low sodium heart healthy   Complete by:  As directed    Discharge instructions   Complete by:  As directed    Follow with Primary MD  [name], MD this coming Monday for a CBC  Follow with Dr Sallyanne Kuster in 1 week or so  Do not take coumadin until seen by GI MD or Dr Sallyanne Kuster    Please get a complete blood count and chemistry panel checked by your Primary MD at your next visit, and again as instructed by your Primary MD.  Get Medicines reviewed and adjusted: Please take all your medications with you  for your next visit with your Primary MD  Laboratory/radiological data: Please request your Primary MD to go over all hospital tests and procedure/radiological results at  the follow up, please ask your Primary MD to get all Hospital records sent to his/her office.  In some cases, they will be blood work, cultures and biopsy results pending at the time of your discharge. Please request that your primary care M.D. follows up on these results.  Also Note the following: If you experience worsening of your admission symptoms, develop shortness of breath, life threatening emergency, suicidal or homicidal thoughts you must seek medical attention immediately by calling 911 or calling your MD immediately  if symptoms less severe.  You must read complete instructions/literature along with all the possible adverse reactions/side effects for all the Medicines you take and that have been prescribed to you. Take any new Medicines after you have completely understood and accpet all the possible adverse reactions/side effects.   Do not drive when taking Pain medications or sleeping medications (Benzodaizepines)  Do not take more than prescribed Pain, Sleep and Anxiety Medications. It is not advisable to combine anxiety,sleep and pain medications without talking with your primary care practitioner  Special Instructions: If you have smoked or chewed Tobacco  in the last 2 yrs please stop smoking, stop any regular Alcohol  and or any Recreational drug use.  Wear Seat belts while driving.  Please note: You were cared for by a hospitalist during your hospital stay. Once you are discharged, your primary care physician will handle any further medical issues. Please note that NO REFILLS for any discharge medications will be authorized once you are discharged, as it is imperative that you return to your primary care physician (or establish a relationship with a primary care physician if you do not have one) for your post hospital discharge needs so that they can reassess your need for medications and monitor your lab values.   Increase activity slowly   Complete by:  As directed       Allergies as of 06/07/2018      Reactions   Amiodarone Other (See Comments)   Had a severe lung infection in 2003   Antihistamines, Diphenhydramine-type Other (See Comments)   Jittery      Medication List    TAKE these medications   atorvastatin 40 MG tablet Commonly known as:  LIPITOR Take 40 mg by mouth every evening.   donepezil 5 MG tablet Commonly known as:  ARICEPT Take 1 tablet (5 mg total) by mouth 2 (two) times daily.   finasteride 5 MG tablet Commonly known as:  PROSCAR Take 5 mg by mouth every evening.   multivitamin with minerals Tabs tablet Take 1 tablet by mouth daily.   Vitamin D 50 MCG (2000 UT) Caps Take 4,000 Units by mouth daily.      Follow-up Information    [name], MD. Go on 06/09/2018.   Specialty:  Internal Medicine Why:  for a repeat CBC Contact information: [address] [place] [address] [phone]        Croitoru, Dani Gobble, MD. Schedule an appointment as soon as possible for a visit in 1 week(s).   Specialty:  Cardiology Contact information: [address] [place] [address] [facility] Follow up.   Specialty:  Home Health Services Why:  they will call you in 1-2 days to set up your first home appointment.  Contact information: [place] [address] [phone]          Allergies  Allergen Reactions  . Amiodarone Other (See Comments)    Had a severe lung infection in 2003  . Antihistamines, Diphenhydramine-Type Other (See Comments)    Jittery    Consultations:   GI   Other Procedures/Studies: Ct Head Wo Contrast  Result Date: 06/05/2018 CLINICAL DATA:  84 year old male with C-spine trauma. EXAM: CT HEAD WITHOUT CONTRAST CT CERVICAL SPINE WITHOUT CONTRAST TECHNIQUE: Multidetector CT imaging of the head and cervical spine was performed following the standard protocol without intravenous contrast. Multiplanar  CT image reconstructions of the cervical spine were also generated. COMPARISON:  Head CT dated 05/11/2016 FINDINGS: CT HEAD FINDINGS Brain: There is moderate age-related atrophy and chronic microvascular ischemic changes. There is no acute intracranial hemorrhage. No mass effect or midline shift. No extra-axial fluid collection. Vascular: No hyperdense vessel or unexpected calcification. Skull: Normal. Negative for fracture or focal lesion. Sinuses/Orbits: No acute finding. Other: None CT CERVICAL SPINE FINDINGS Alignment: No acute subluxation. There is grade 1 C7-T1 anterolisthesis. Skull base and vertebrae: No acute fracture. There is advanced osteopenia. Soft tissues and spinal canal: No prevertebral fluid or swelling. No visible canal hematoma. Disc levels: C6-C7 ankylosis. There is multilevel degenerative changes with disc space narrowing and endplate irregularity. Multilevel facet hypertrophy. Upper chest: Emphysema. Other: None IMPRESSION: 1. No acute intracranial hemorrhage. Moderate age-related atrophy and chronic microvascular ischemic changes. 2. No acute/traumatic cervical spine pathology. Multilevel degenerative changes. Electronically Signed   By: Anner Crete M.D.   On: 06/05/2018 06:47   Ct Cervical Spine Wo Contrast  Result Date: 06/05/2018 CLINICAL DATA:  84 year old male with C-spine trauma. EXAM: CT HEAD WITHOUT CONTRAST CT CERVICAL SPINE WITHOUT CONTRAST TECHNIQUE: Multidetector CT imaging of the head and cervical spine was performed following the standard protocol without intravenous contrast. Multiplanar CT image reconstructions of the cervical spine were also generated. COMPARISON:  Head CT dated 05/11/2016 FINDINGS: CT HEAD FINDINGS Brain: There is moderate age-related atrophy and chronic microvascular ischemic changes. There is no acute intracranial hemorrhage. No mass effect or midline shift. No extra-axial fluid collection. Vascular: No hyperdense vessel or unexpected  calcification. Skull: Normal. Negative for fracture or focal lesion. Sinuses/Orbits: No acute finding. Other: None CT CERVICAL SPINE FINDINGS Alignment: No acute subluxation. There is grade 1 C7-T1 anterolisthesis. Skull base and vertebrae: No acute fracture. There is advanced osteopenia. Soft tissues and spinal canal: No prevertebral fluid or swelling. No visible canal hematoma. Disc levels: C6-C7 ankylosis. There is multilevel degenerative changes with disc space narrowing and endplate irregularity. Multilevel facet hypertrophy. Upper chest: Emphysema. Other: None IMPRESSION: 1. No acute intracranial hemorrhage. Moderate age-related atrophy and chronic microvascular ischemic changes. 2. No acute/traumatic cervical spine pathology. Multilevel degenerative changes. Electronically Signed   By: Anner Crete M.D.   On: 06/05/2018 06:47   Dg Abdomen Acute W/chest  Result Date: 06/05/2018 CLINICAL DATA:  Abdominal pain tonight. EXAM: DG ABDOMEN ACUTE W/ 1V CHEST COMPARISON:  Chest radiograph May 08, 2017 FINDINGS: Cardiac silhouette is mildly enlarged and unchanged. Ectatic calcified aorta. Chronic interstitial changes without pleural effusion or focal consolidation. No pneumothorax. Old RIGHT rib fractures. Osteopenia. Bowel gas pattern is nondilated and nonobstructive. 2.3 cm coarse calcification LEFT abdomen. No intra-abdominal mass effect. Advanced vascular calcifications. Osteopenia. Severe lumbar spondylosis. IMPRESSION: 1. Mild cardiomegaly and chronic interstitial changes. 2. Nonspecific bowel gas pattern. LEFT abdomen calcification likely reflects nephrolithiasis. 3.  Aortic Atherosclerosis (ICD10-I70.0). Electronically  Signed   By: Elon Alas M.D.   On: 06/05/2018 04:40   Ct Angio Abd/pel W And/or Wo Contrast  Result Date: 06/05/2018 CLINICAL DATA:  Syncopal episode.  Gastrointestinal bleeding. EXAM: CTA ABDOMEN AND PELVIS wITHOUT AND WITH CONTRAST TECHNIQUE: Multidetector CT imaging of  the abdomen and pelvis was performed using the standard protocol during bolus administration of intravenous contrast. Multiplanar reconstructed images and MIPs were obtained and reviewed to evaluate the vascular anatomy. CONTRAST:  31mL ISOVUE-370 IOPAMIDOL (ISOVUE-370) INJECTION 76% COMPARISON:  Abdominal ultrasound 01/06/2018. CT abdomen pelvis January 2017. FINDINGS: VASCULAR Aorta: Diffuse atherosclerosis of the aorta. Maximal diameter of the infrarenal aorta measures 3.1 cm, increased from 2.7 cm in 2017. No sign of aortic stenosis. Celiac: 20% origin stenosis. No evidence of any extravasation in that vascular territory. SMA: 20% stenosis 1 cm beyond the origin. No evidence of extravasation in that vascular territory. Renals: Single renal artery on the right. Tiny accessory upper pole renal artery on the left. Atherosclerotic disease at both renal artery origins. 50-70% stenosis on the right. 30-50% stenosis on the left. IMA: Inferior mesenteric artery shows flow. Inflow: Normal Proximal Outflow: Bilateral iliac atherosclerotic disease. No flow limiting stenosis suspected. Common femoral arteries show atherosclerosis but no stenosis greater than 30%. Veins: Normal Review of the MIP images confirms the above findings. NON-VASCULAR Lower chest: Scarring or atelectasis at the lung bases. Cardiomegaly. Hepatobiliary: No calcified gallstones. Probable cirrhosis of the liver. No focal lesion. Pancreas: Normal Spleen: Normal Adrenals/Urinary Tract: Adrenal glands are normal. Right kidney is normal. Left kidney shows a chronic stone it in the extrarenal pelvis and UPJ region measuring up to 2.4 cm in size with chronic fullness of the left renal collecting system. Additional nonobstructing 6 mm stone in the lower pole of the left kidney. Bladder is negative. Stomach/Bowel: Liquid material within the colon. No evidence of vascular extravasation is present. There is sigmoid diverticulosis without evidence of  diverticulitis. Lymphatic: No adenopathy. Reproductive: No acute finding. Other: No free fluid or air. Musculoskeletal: Extensive chronic degenerative changes of the spine. IMPRESSION: VASCULAR Atherosclerotic disease of the aorta. Maximal diameter of the infrarenal aorta is 3.1 cm. Atherosclerosis of the branch vessels as outlined above without acute relevant finding to the clinical presentation. NON-VASCULAR Liquid stool in the colon. I do not see any evidence of vascular extravasation identifiable by this exam. Cirrhosis of the liver. Sigmoid diverticulosis without evidence of diverticulitis. Chronic 2.4 cm stone in the left extra renal pelvis with chronic fullness of the left renal collecting system. Electronically Signed   By: Nelson Chimes M.D.   On: 06/05/2018 06:55     TODAY-DAY OF DISCHARGE:  Subjective:   [name] [name] today has no headache,no chest abdominal pain,no new weakness tingling or numbness, feels much better wants to go home today.   Objective:   Blood pressure (!) 114/57, pulse (!) 59, temperature 97.6 F (36.4 C), temperature source Oral, resp. rate 20, height 6' (1.829 m), weight 77.1 kg, SpO2 96 %.  Intake/Output Summary (Last 24 hours) at 06/07/2018 0933 Last data filed at 06/07/2018 0600 Gross per 24 hour  Intake 1312.5 ml  Output -  Net 1312.5 ml   Filed Weights   06/05/18 0322  Weight: 77.1 kg    Exam: Awake Alert, Oriented *3, No new F.N deficits, Normal affect [place].AT,PERRAL Supple Neck,No JVD, No cervical lymphadenopathy appriciated.  Symmetrical Chest wall movement, Good air movement bilaterally, CTAB RRR,No Gallops,Rubs or new Murmurs, No Parasternal Heave +ve B.Sounds, Abd Soft,  Non tender, No organomegaly appriciated, No rebound -guarding or rigidity. No Cyanosis, Clubbing or edema, No new Rash or bruise   PERTINENT RADIOLOGIC STUDIES: Ct Head Wo Contrast  Result Date: 06/05/2018 CLINICAL DATA:  84 year old male with C-spine trauma. EXAM: CT HEAD  WITHOUT CONTRAST CT CERVICAL SPINE WITHOUT CONTRAST TECHNIQUE: Multidetector CT imaging of the head and cervical spine was performed following the standard protocol without intravenous contrast. Multiplanar CT image reconstructions of the cervical spine were also generated. COMPARISON:  Head CT dated 05/11/2016 FINDINGS: CT HEAD FINDINGS Brain: There is moderate age-related atrophy and chronic microvascular ischemic changes. There is no acute intracranial hemorrhage. No mass effect or midline shift. No extra-axial fluid collection. Vascular: No hyperdense vessel or unexpected calcification. Skull: Normal. Negative for fracture or focal lesion. Sinuses/Orbits: No acute finding. Other: None CT CERVICAL SPINE FINDINGS Alignment: No acute subluxation. There is grade 1 C7-T1 anterolisthesis. Skull base and vertebrae: No acute fracture. There is advanced osteopenia. Soft tissues and spinal canal: No prevertebral fluid or swelling. No visible canal hematoma. Disc levels: C6-C7 ankylosis. There is multilevel degenerative changes with disc space narrowing and endplate irregularity. Multilevel facet hypertrophy. Upper chest: Emphysema. Other: None IMPRESSION: 1. No acute intracranial hemorrhage. Moderate age-related atrophy and chronic microvascular ischemic changes. 2. No acute/traumatic cervical spine pathology. Multilevel degenerative changes. Electronically Signed   By: Anner Crete M.D.   On: 06/05/2018 06:47   Ct Cervical Spine Wo Contrast  Result Date: 06/05/2018 CLINICAL DATA:  84 year old male with C-spine trauma. EXAM: CT HEAD WITHOUT CONTRAST CT CERVICAL SPINE WITHOUT CONTRAST TECHNIQUE: Multidetector CT imaging of the head and cervical spine was performed following the standard protocol without intravenous contrast. Multiplanar CT image reconstructions of the cervical spine were also generated. COMPARISON:  Head CT dated 05/11/2016 FINDINGS: CT HEAD FINDINGS Brain: There is moderate age-related atrophy and  chronic microvascular ischemic changes. There is no acute intracranial hemorrhage. No mass effect or midline shift. No extra-axial fluid collection. Vascular: No hyperdense vessel or unexpected calcification. Skull: Normal. Negative for fracture or focal lesion. Sinuses/Orbits: No acute finding. Other: None CT CERVICAL SPINE FINDINGS Alignment: No acute subluxation. There is grade 1 C7-T1 anterolisthesis. Skull base and vertebrae: No acute fracture. There is advanced osteopenia. Soft tissues and spinal canal: No prevertebral fluid or swelling. No visible canal hematoma. Disc levels: C6-C7 ankylosis. There is multilevel degenerative changes with disc space narrowing and endplate irregularity. Multilevel facet hypertrophy. Upper chest: Emphysema. Other: None IMPRESSION: 1. No acute intracranial hemorrhage. Moderate age-related atrophy and chronic microvascular ischemic changes. 2. No acute/traumatic cervical spine pathology. Multilevel degenerative changes. Electronically Signed   By: Anner Crete M.D.   On: 06/05/2018 06:47   Dg Abdomen Acute W/chest  Result Date: 06/05/2018 CLINICAL DATA:  Abdominal pain tonight. EXAM: DG ABDOMEN ACUTE W/ 1V CHEST COMPARISON:  Chest radiograph May 08, 2017 FINDINGS: Cardiac silhouette is mildly enlarged and unchanged. Ectatic calcified aorta. Chronic interstitial changes without pleural effusion or focal consolidation. No pneumothorax. Old RIGHT rib fractures. Osteopenia. Bowel gas pattern is nondilated and nonobstructive. 2.3 cm coarse calcification LEFT abdomen. No intra-abdominal mass effect. Advanced vascular calcifications. Osteopenia. Severe lumbar spondylosis. IMPRESSION: 1. Mild cardiomegaly and chronic interstitial changes. 2. Nonspecific bowel gas pattern. LEFT abdomen calcification likely reflects nephrolithiasis. 3.  Aortic Atherosclerosis (ICD10-I70.0). Electronically Signed   By: Elon Alas M.D.   On: 06/05/2018 04:40   Ct Angio Abd/pel W And/or Wo  Contrast  Result Date: 06/05/2018 CLINICAL DATA:  Syncopal episode.  Gastrointestinal bleeding. EXAM:  CTA ABDOMEN AND PELVIS wITHOUT AND WITH CONTRAST TECHNIQUE: Multidetector CT imaging of the abdomen and pelvis was performed using the standard protocol during bolus administration of intravenous contrast. Multiplanar reconstructed images and MIPs were obtained and reviewed to evaluate the vascular anatomy. CONTRAST:  11mL ISOVUE-370 IOPAMIDOL (ISOVUE-370) INJECTION 76% COMPARISON:  Abdominal ultrasound 01/06/2018. CT abdomen pelvis January 2017. FINDINGS: VASCULAR Aorta: Diffuse atherosclerosis of the aorta. Maximal diameter of the infrarenal aorta measures 3.1 cm, increased from 2.7 cm in 2017. No sign of aortic stenosis. Celiac: 20% origin stenosis. No evidence of any extravasation in that vascular territory. SMA: 20% stenosis 1 cm beyond the origin. No evidence of extravasation in that vascular territory. Renals: Single renal artery on the right. Tiny accessory upper pole renal artery on the left. Atherosclerotic disease at both renal artery origins. 50-70% stenosis on the right. 30-50% stenosis on the left. IMA: Inferior mesenteric artery shows flow. Inflow: Normal Proximal Outflow: Bilateral iliac atherosclerotic disease. No flow limiting stenosis suspected. Common femoral arteries show atherosclerosis but no stenosis greater than 30%. Veins: Normal Review of the MIP images confirms the above findings. NON-VASCULAR Lower chest: Scarring or atelectasis at the lung bases. Cardiomegaly. Hepatobiliary: No calcified gallstones. Probable cirrhosis of the liver. No focal lesion. Pancreas: Normal Spleen: Normal Adrenals/Urinary Tract: Adrenal glands are normal. Right kidney is normal. Left kidney shows a chronic stone it in the extrarenal pelvis and UPJ region measuring up to 2.4 cm in size with chronic fullness of the left renal collecting system. Additional nonobstructing 6 mm stone in the lower pole of the left  kidney. Bladder is negative. Stomach/Bowel: Liquid material within the colon. No evidence of vascular extravasation is present. There is sigmoid diverticulosis without evidence of diverticulitis. Lymphatic: No adenopathy. Reproductive: No acute finding. Other: No free fluid or air. Musculoskeletal: Extensive chronic degenerative changes of the spine. IMPRESSION: VASCULAR Atherosclerotic disease of the aorta. Maximal diameter of the infrarenal aorta is 3.1 cm. Atherosclerosis of the branch vessels as outlined above without acute relevant finding to the clinical presentation. NON-VASCULAR Liquid stool in the colon. I do not see any evidence of vascular extravasation identifiable by this exam. Cirrhosis of the liver. Sigmoid diverticulosis without evidence of diverticulitis. Chronic 2.4 cm stone in the left extra renal pelvis with chronic fullness of the left renal collecting system. Electronically Signed   By: Nelson Chimes M.D.   On: 06/05/2018 06:55     PERTINENT LAB RESULTS: CBC: Recent Labs    06/06/18 1830 06/07/18 0508  WBC 5.9 5.4  HGB 7.6* 8.7*  HCT 24.7* 27.1*  PLT 164 162   CMET CMP     Component Value Date/Time   NA 141 06/06/2018 0605   K 3.8 06/06/2018 0605   CL 112 (H) 06/06/2018 0605   CO2 23 06/06/2018 0605   GLUCOSE 90 06/06/2018 0605   BUN 22 06/06/2018 0605   CREATININE 1.23 06/06/2018 0605   CALCIUM 8.1 (L) 06/06/2018 0605   PROT 6.9 06/05/2018 0339   ALBUMIN 2.6 (L) 06/05/2018 0339   AST 30 06/05/2018 0339   ALT 15 06/05/2018 0339   ALKPHOS 94 06/05/2018 0339   BILITOT 0.6 06/05/2018 0339   GFRNONAA 51 (L) 06/06/2018 0605   GFRAA 60 (L) 06/06/2018 0605    GFR Estimated Creatinine Clearance: 43.5 mL/min (by C-G formula based on SCr of 1.23 mg/dL). No results for input(s): LIPASE, AMYLASE in the last 72 hours. No results for input(s): CKTOTAL, CKMB, CKMBINDEX, TROPONINI in the last 72 hours. Invalid  input(s): POCBNP No results for input(s): DDIMER in the last  72 hours. No results for input(s): HGBA1C in the last 72 hours. No results for input(s): CHOL, HDL, LDLCALC, TRIG, CHOLHDL, LDLDIRECT in the last 72 hours. No results for input(s): TSH, T4TOTAL, T3FREE, THYROIDAB in the last 72 hours.  Invalid input(s): FREET3 No results for input(s): VITAMINB12, FOLATE, FERRITIN, TIBC, IRON, RETICCTPCT in the last 72 hours. Coags: Recent Labs    06/05/18 0339 06/05/18 1626  INR 2.71 1.51   Microbiology: Recent Results (from the past 240 hour(s))  Blood culture (routine x 2)     Status: None (Preliminary result)   Collection Time: 06/05/18  3:40 AM  Result Value Ref Range Status   Specimen Description BLOOD RIGHT ANTECUBITAL  Final   Special Requests   Final    BOTTLES DRAWN AEROBIC AND ANAEROBIC Blood Culture results may not be optimal due to an inadequate volume of blood received in culture bottles   Culture   Final    NO GROWTH 1 DAY Performed at [facility] [address] [place], [place] [id number]    Report Status PENDING  Incomplete  Blood culture (routine x 2)     Status: None (Preliminary result)   Collection Time: 06/05/18  4:09 AM  Result Value Ref Range Status   Specimen Description BLOOD RIGHT FOREARM  Final   Special Requests   Final    BOTTLES DRAWN AEROBIC AND ANAEROBIC Blood Culture adequate volume   Culture   Final    NO GROWTH 1 DAY Performed at [facility], [place] [address] [place], [place] [address]    Report Status PENDING  Incomplete    FURTHER DISCHARGE INSTRUCTIONS:  Get Medicines reviewed and adjusted: Please take all your medications with you for your next visit with your Primary MD  Laboratory/radiological data: Please request your Primary MD to go over all hospital tests and procedure/radiological results at the follow up, please ask your Primary MD to get all Hospital records sent to his/her office.  In some cases, they will be blood work, cultures and biopsy results pending at the time of  your discharge. Please request that your primary care M.D. goes through all the records of your hospital data and follows up on these results.  Also Note the following: If you experience worsening of your admission symptoms, develop shortness of breath, life threatening emergency, suicidal or homicidal thoughts you must seek medical attention immediately by calling 911 or calling your MD immediately  if symptoms less severe.  You must read complete instructions/literature along with all the possible adverse reactions/side effects for all the Medicines you take and that have been prescribed to you. Take any new Medicines after you have completely understood and accpet all the possible adverse reactions/side effects.   Do not drive when taking Pain medications or sleeping medications (Benzodaizepines)  Do not take more than prescribed Pain, Sleep and Anxiety Medications. It is not advisable to combine anxiety,sleep and pain medications without talking with your primary care practitioner  Special Instructions: If you have smoked or chewed Tobacco  in the last 2 yrs please stop smoking, stop any regular Alcohol  and or any Recreational drug use.  Wear Seat belts while driving.  Please note: You were cared for by a hospitalist during your hospital stay. Once you are discharged, your primary care physician will handle any further medical issues. Please note that NO REFILLS for any discharge medications will be authorized once you  are discharged, as it is imperative that you return to your primary care physician (or establish a relationship with a primary care physician if you do not have one) for your post hospital discharge needs so that they can reassess your need for medications and monitor your lab values.  Total Time spent coordinating discharge including counseling, education and face to face time equals 45 minutes.  SignedOren Binet 06/07/2018 9:33 AM

## 2018-06-07 NOTE — Progress Notes (Signed)
[facility] PROGRESS NOTE Subjective Wife at bedside notes no blood in bowel movement.  Hemoglobin up to 8.7  Objective: Vital signs in last 24 hours: Temp:  [97.6 F (36.4 C)-98.9 F (37.2 C)] 97.6 F (36.4 C) (12/07 0526) Pulse Rate:  [59-84] 59 (12/07 0526) Resp:  [14-20] 20 (12/07 0526) BP: (105-124)/(51-84) 114/57 (12/07 0526) SpO2:  [94 %-100 %] 96 % (12/07 0526) Last BM Date: 06/06/18  Intake/Output from previous day: 12/06 0701 - 12/07 0700 In: 1312.5 [P.O.:580; I.V.:450; Blood:282.5] Out: -  Intake/Output this shift: No intake/output data recorded.  PE: General--no acute distress   Abdomen--soft nontender  Lab Results: Recent Labs    06/05/18 2319 06/06/18 0605 06/06/18 1018 06/06/18 1830 06/07/18 0508  WBC 6.1 5.0 4.6 5.9 5.4  HGB 7.7* 7.9* 7.8* 7.6* 8.7*  HCT 24.8* 25.5* 25.5* 24.7* 27.1*  PLT 155 151 156 164 162   BMET Recent Labs    06/05/18 0339 06/05/18 0345 06/06/18 0605  NA 140 141 141  K 4.0 4.0 3.8  CL 103 106 112*  CO2 22  --  23  CREATININE 1.43* 1.30* 1.23   LFT Recent Labs    06/05/18 0339  PROT 6.9  AST 30  ALT 15  ALKPHOS 94  BILITOT 0.6   PT/INR Recent Labs    06/05/18 0339 06/05/18 1626  LABPROT 28.4* 18.0*  INR 2.71 1.51   PANCREAS No results for input(s): LIPASE in the last 72 hours.       Studies/Results: No results found.  Medications: I have reviewed the patient's current medications.  Assessment:   1.  Probable diverticular bleed seems to be stable still on clear liquids   Plan: 1 should be okay to discharge. 2.  Have discussed with wife he should remain on a soft diet and I would suggest 1 dose of MiraLAX daily for several days. 3.  I would have him call Dr. Cristina Gong or [name] on Monday and arrange for repeat CBC on Monday or Tuesday.   Nancy Fetter 06/07/2018, 7:53 AM  This note was created using voice recognition software. Minor errors may Have occurred  unintentionally.  Pager: [phone] If no answer or after hours call [phone]

## 2018-06-07 NOTE — Progress Notes (Signed)
Pt and family given discharge instructions, prescriptions, and care notes. Pt and family verbalized understanding AEB no further questions or concerns at this time. IVs were discontinued, no redness, pain, or swelling noted at this time. Skin dry and intact. Pt left the floor via wheelchair with staff and family in stable condition.

## 2018-06-08 ENCOUNTER — Encounter (HOSPITAL_COMMUNITY): Payer: Self-pay | Admitting: Emergency Medicine

## 2018-06-08 ENCOUNTER — Other Ambulatory Visit: Payer: Self-pay

## 2018-06-08 DIAGNOSIS — Z950 Presence of cardiac pacemaker: Secondary | ICD-10-CM

## 2018-06-08 DIAGNOSIS — I4821 Permanent atrial fibrillation: Secondary | ICD-10-CM | POA: Diagnosis not present

## 2018-06-08 DIAGNOSIS — D62 Acute posthemorrhagic anemia: Secondary | ICD-10-CM | POA: Diagnosis present

## 2018-06-08 DIAGNOSIS — G301 Alzheimer's disease with late onset: Secondary | ICD-10-CM | POA: Diagnosis not present

## 2018-06-08 DIAGNOSIS — F0281 Dementia in other diseases classified elsewhere with behavioral disturbance: Secondary | ICD-10-CM | POA: Diagnosis not present

## 2018-06-08 DIAGNOSIS — K5791 Diverticulosis of intestine, part unspecified, without perforation or abscess with bleeding: Secondary | ICD-10-CM | POA: Diagnosis not present

## 2018-06-08 LAB — CBC WITH DIFFERENTIAL/PLATELET
Abs Immature Granulocytes: 0.01 10*3/uL (ref 0.00–0.07)
Abs Immature Granulocytes: 0.03 10*3/uL (ref 0.00–0.07)
BASOS ABS: 0 10*3/uL (ref 0.0–0.1)
Basophils Absolute: 0 10*3/uL (ref 0.0–0.1)
Basophils Relative: 0 %
Basophils Relative: 1 %
Eosinophils Absolute: 0.1 10*3/uL (ref 0.0–0.5)
Eosinophils Absolute: 0.1 10*3/uL (ref 0.0–0.5)
Eosinophils Relative: 2 %
Eosinophils Relative: 3 %
HCT: 25 % — ABNORMAL LOW (ref 39.0–52.0)
HCT: 25.3 % — ABNORMAL LOW (ref 39.0–52.0)
Hemoglobin: 7.8 g/dL — ABNORMAL LOW (ref 13.0–17.0)
Hemoglobin: 8 g/dL — ABNORMAL LOW (ref 13.0–17.0)
Immature Granulocytes: 0 %
Immature Granulocytes: 0 %
Lymphocytes Relative: 11 %
Lymphocytes Relative: 23 %
Lymphs Abs: 0.9 10*3/uL (ref 0.7–4.0)
Lymphs Abs: 1.1 10*3/uL (ref 0.7–4.0)
MCH: 31.1 pg (ref 26.0–34.0)
MCH: 31.2 pg (ref 26.0–34.0)
MCHC: 31.2 g/dL (ref 30.0–36.0)
MCHC: 31.6 g/dL (ref 30.0–36.0)
MCV: 100 fL (ref 80.0–100.0)
MCV: 98.4 fL (ref 80.0–100.0)
Monocytes Absolute: 0.8 10*3/uL (ref 0.1–1.0)
Monocytes Absolute: 1 10*3/uL (ref 0.1–1.0)
Monocytes Relative: 12 %
Monocytes Relative: 16 %
NEUTROS ABS: 2.8 10*3/uL (ref 1.7–7.7)
Neutro Abs: 6.3 10*3/uL (ref 1.7–7.7)
Neutrophils Relative %: 58 %
Neutrophils Relative %: 74 %
Platelets: 150 10*3/uL (ref 150–400)
Platelets: 183 10*3/uL (ref 150–400)
RBC: 2.5 MIL/uL — ABNORMAL LOW (ref 4.22–5.81)
RBC: 2.57 MIL/uL — ABNORMAL LOW (ref 4.22–5.81)
RDW: 15 % (ref 11.5–15.5)
RDW: 15.2 % (ref 11.5–15.5)
WBC: 4.8 10*3/uL (ref 4.0–10.5)
WBC: 8.4 10*3/uL (ref 4.0–10.5)
nRBC: 0 % (ref 0.0–0.2)
nRBC: 0 % (ref 0.0–0.2)

## 2018-06-08 LAB — BPAM RBC
Blood Product Expiration Date: 201912312359
Blood Product Expiration Date: 201912312359
Blood Product Expiration Date: 201912312359
ISSUE DATE / TIME: 201912051213
ISSUE DATE / TIME: 201912062106
Unit Type and Rh: 5100
Unit Type and Rh: 5100
Unit Type and Rh: 5100

## 2018-06-08 LAB — PROTIME-INR
INR: 1.43
Prothrombin Time: 17.3 seconds — ABNORMAL HIGH (ref 11.4–15.2)

## 2018-06-08 LAB — TYPE AND SCREEN
ABO/RH(D): O POS
ANTIBODY SCREEN: NEGATIVE
UNIT DIVISION: 0
UNIT DIVISION: 0
Unit division: 0

## 2018-06-08 LAB — CBC
HCT: 24.4 % — ABNORMAL LOW (ref 39.0–52.0)
Hemoglobin: 7.9 g/dL — ABNORMAL LOW (ref 13.0–17.0)
MCH: 31.1 pg (ref 26.0–34.0)
MCHC: 32.4 g/dL (ref 30.0–36.0)
MCV: 96.1 fL (ref 80.0–100.0)
NRBC: 0 % (ref 0.0–0.2)
Platelets: 155 10*3/uL (ref 150–400)
RBC: 2.54 MIL/uL — AB (ref 4.22–5.81)
RDW: 15.4 % (ref 11.5–15.5)
WBC: 6.2 10*3/uL (ref 4.0–10.5)

## 2018-06-08 LAB — COMPREHENSIVE METABOLIC PANEL
ALT: 20 U/L (ref 0–44)
AST: 44 U/L — ABNORMAL HIGH (ref 15–41)
Albumin: 2.3 g/dL — ABNORMAL LOW (ref 3.5–5.0)
Alkaline Phosphatase: 70 U/L (ref 38–126)
Anion gap: 8 (ref 5–15)
BUN: 19 mg/dL (ref 8–23)
CO2: 24 mmol/L (ref 22–32)
Calcium: 8.4 mg/dL — ABNORMAL LOW (ref 8.9–10.3)
Chloride: 108 mmol/L (ref 98–111)
Creatinine, Ser: 1.19 mg/dL (ref 0.61–1.24)
GFR calc Af Amer: >60 mL/min (ref >60)
GFR calc non Af Amer: 53 mL/min — ABNORMAL LOW (ref >60)
Glucose, Bld: 123 mg/dL — ABNORMAL HIGH (ref 70–99)
Potassium: 3.8 mmol/L (ref 3.5–5.1)
Sodium: 140 mmol/L (ref 135–145)
Total Bilirubin: 0.6 mg/dL (ref 0.3–1.2)
Total Protein: 5.6 g/dL — ABNORMAL LOW (ref 6.5–8.1)

## 2018-06-08 LAB — POC OCCULT BLOOD, ED: Fecal Occult Bld: POSITIVE — AB

## 2018-06-08 LAB — PREPARE RBC (CROSSMATCH)

## 2018-06-08 MED ORDER — FINASTERIDE 5 MG PO TABS
5.0000 mg | ORAL_TABLET | Freq: Every evening | ORAL | Status: DC
  Administered 2018-06-08 – 2018-06-10 (×3): 5 mg via ORAL
  Filled 2018-06-08 (×4): qty 1

## 2018-06-08 MED ORDER — ONDANSETRON HCL 4 MG/2ML IJ SOLN: 4.0000 mg | Freq: Four times a day (QID) | INTRAMUSCULAR | Status: DC | PRN

## 2018-06-08 MED ORDER — ALBUTEROL SULFATE (2.5 MG/3ML) 0.083% IN NEBU: 2.5000 mg | INHALATION_SOLUTION | RESPIRATORY_TRACT | Status: DC | PRN

## 2018-06-08 MED ORDER — SODIUM CHLORIDE 0.9 % IV SOLN
INTRAVENOUS | Status: DC
  Administered 2018-06-08 – 2018-06-10 (×2): via INTRAVENOUS

## 2018-06-08 MED ORDER — ACETAMINOPHEN 650 MG RE SUPP: 650.0000 mg | Freq: Four times a day (QID) | RECTAL | Status: DC | PRN

## 2018-06-08 MED ORDER — ACETAMINOPHEN 325 MG PO TABS
650.0000 mg | ORAL_TABLET | Freq: Four times a day (QID) | ORAL | Status: DC | PRN
  Administered 2018-06-09: 650 mg via ORAL
  Filled 2018-06-08: qty 2

## 2018-06-08 MED ORDER — PANTOPRAZOLE SODIUM 40 MG IV SOLR
40.0000 mg | Freq: Two times a day (BID) | INTRAVENOUS | Status: DC
  Administered 2018-06-08 – 2018-06-11 (×8): 40 mg via INTRAVENOUS
  Filled 2018-06-08 (×8): qty 40

## 2018-06-08 MED ORDER — DONEPEZIL HCL 5 MG PO TABS
5.0000 mg | ORAL_TABLET | Freq: Two times a day (BID) | ORAL | Status: DC
  Administered 2018-06-08 – 2018-06-11 (×6): 5 mg via ORAL
  Filled 2018-06-08 (×7): qty 1

## 2018-06-08 MED ORDER — SODIUM CHLORIDE 0.9% IV SOLUTION
Freq: Once | INTRAVENOUS | Status: AC
  Administered 2018-06-08: 03:00:00 via INTRAVENOUS

## 2018-06-08 MED ORDER — ATORVASTATIN CALCIUM 40 MG PO TABS
40.0000 mg | ORAL_TABLET | Freq: Every evening | ORAL | Status: DC
  Administered 2018-06-08 – 2018-06-10 (×3): 40 mg via ORAL
  Filled 2018-06-08 (×3): qty 1

## 2018-06-08 MED ORDER — ONDANSETRON HCL 4 MG PO TABS: 4.0000 mg | ORAL_TABLET | Freq: Four times a day (QID) | ORAL | Status: DC | PRN

## 2018-06-08 MED ORDER — POLYETHYLENE GLYCOL 3350 17 G PO PACK
17.0000 g | PACK | Freq: Two times a day (BID) | ORAL | Status: DC
  Administered 2018-06-09 – 2018-06-10 (×4): 17 g via ORAL
  Filled 2018-06-08 (×5): qty 1

## 2018-06-08 NOTE — Progress Notes (Signed)
Patient Demographics:    [name] [name], is a 84 y.o. male, DOB - 05-Feb-1926, QGB:[id number]  Admit date - 06/07/2018   Admitting Physician Norval Morton, MD  Outpatient Primary MD for the patient is [name], MD  LOS - 0   Chief Complaint  Patient presents with  . GI Bleeding        Subjective:    [name] [name] today has no fevers, no emesis,  No chest pain, wife at bedside, no further bright red blood per rectum, patient is forgetful  Assessment  & Plan :    Active Problems:   Pacemaker   Permanent atrial fibrillation (HCC)   Alzheimer's disease ([place])   GI bleed   Acute blood loss anemia  Brief summary 84 y.o.malewith history of atrial fibrillation on Coumadin, history of third-degree AV block requiring PPM implantation, dementia, hypertension, dyslipidemia presenting with diverticular bleeding readmitted on 06/08/2018 with acute blood loss anemia been recently discharged on 06/07/2018 with the same   Plan:- 1)Acute on chronic anemia secondary to acute blood loss from presumed diverticular bleeding----patient was transfused 1 unit of packed cells on 06/08/2018 (received 2 units of packed cells within the last 5 days previously for a total of 3 units at this time in the last 5 days), Hgb is currently 8, no further bright red blood per Rectum over the last 8 hours,  GI consult from Dr. Oletta [name] appreciated, no endoluminal evaluation planned at this time, he advised clear liquid diet  2)Chronic atrial fibrillation--- Coumadin currently on hold due to recurrent diverticular GI bleed as above #1, status post prior pacemaker placement for second/third-degree AV block.... Patient usually follows with Dr. Sallyanne [name]  3)Possible CT evidence of liver cirrhosis----this may affect the patient's coagulation profile, patient to follow-up with GI as outpatient for further management... No evidence of hepatic  encephalopathy at this time  4)CAD--post prior CABG----no chest pains, last known EF 50 to 55% continue Lipitor, Coumadin on hold due to #1 above  5)BPH with LUTs--stable continue finasteride 5 mg qpm  6)Dementia--table, cognitive deficits noted, no evidence of significant delirium at this time, continue Aricept   Disposition/Need for in-Hospital Stay- patient unable to be discharged at this time due to recurrent GI bleed requiring transfusion  Code Status : DNR   Disposition Plan  : TBD  Consults  :  Gi  DVT Prophylaxis  :  SCDs   Lab Results  Component Value Date   PLT 150 06/08/2018    Inpatient Medications  Scheduled Meds: . atorvastatin  40 mg Oral QPM  . donepezil  5 mg Oral BID  . finasteride  5 mg Oral QPM  . pantoprazole (PROTONIX) IV  40 mg Intravenous Q12H  . polyethylene glycol  17 g Oral BID   Continuous Infusions: PRN Meds:.acetaminophen **OR** acetaminophen, albuterol, ondansetron **OR** ondansetron (ZOFRAN) IV    Anti-infectives (From admission, onward)   None        Objective:   Vitals:   06/08/18 0930 06/08/18 1000 06/08/18 1030 06/08/18 1045  BP: 106/68 106/64 114/65 122/72  Pulse: 65 65 68 65  Resp: 14 13 14 19   Temp:      TempSrc:      SpO2: 100% 99% 100% 98%  Weight:  Height:        Wt Readings from Last 3 Encounters:  06/07/18 77 kg  06/05/18 77.1 kg  01/09/18 70.8 kg     Intake/Output Summary (Last 24 hours) at 06/08/2018 1216 Last data filed at 06/08/2018 0615 Gross per 24 hour  Intake 1480 ml  Output -  Net 1480 ml     Physical Exam Patient is examined daily including today on 06/08/18 , exams remain the same as of yesterday except that has changed   Gen:- Awake Alert,  In no apparent distress  HEENT:- [place].AT, No sclera icterus Neck-Supple Neck,No JVD,.  Lungs-  CTAB , fair symmetrical air movement CV- S1, S2 normal, regular , right subclavian area with pacemaker in situ, prior CABG scar noted Abd-  +ve  B.Sounds, Abd Soft, No tenderness,    Extremity/Skin:- No  edema, pedal pulses present  Psych-affect is appropriate, some cognitive deficits, according to patient's wife he is at baseline  neuro-no new focal deficits, no tremors   Data Review:   Micro Results Recent Results (from the past 240 hour(s))  Blood culture (routine x 2)     Status: None (Preliminary result)   Collection Time: 06/05/18  3:40 AM  Result Value Ref Range Status   Specimen Description BLOOD RIGHT ANTECUBITAL  Final   Special Requests   Final    BOTTLES DRAWN AEROBIC AND ANAEROBIC Blood Culture results may not be optimal due to an inadequate volume of blood received in culture bottles   Culture   Final    NO GROWTH 2 DAYS Performed at [facility], [place] [address] [place], [place] [address]    Report Status PENDING  Incomplete  Blood culture (routine x 2)     Status: None (Preliminary result)   Collection Time: 06/05/18  4:09 AM  Result Value Ref Range Status   Specimen Description BLOOD RIGHT FOREARM  Final   Special Requests   Final    BOTTLES DRAWN AEROBIC AND ANAEROBIC Blood Culture adequate volume   Culture   Final    NO GROWTH 2 DAYS Performed at [facility], [place] [address] [place], [place] [address]    Report Status PENDING  Incomplete    Radiology Reports Ct Head Wo Contrast  Result Date: 06/05/2018 CLINICAL DATA:  84 year old male with C-spine trauma. EXAM: CT HEAD WITHOUT CONTRAST CT CERVICAL SPINE WITHOUT CONTRAST TECHNIQUE: Multidetector CT imaging of the head and cervical spine was performed following the standard protocol without intravenous contrast. Multiplanar CT image reconstructions of the cervical spine were also generated. COMPARISON:  Head CT dated 05/11/2016 FINDINGS: CT HEAD FINDINGS Brain: There is moderate age-related atrophy and chronic microvascular ischemic changes. There is no acute intracranial hemorrhage. No mass effect or midline shift. No extra-axial fluid  collection. Vascular: No hyperdense vessel or unexpected calcification. Skull: Normal. Negative for fracture or focal lesion. Sinuses/Orbits: No acute finding. Other: None CT CERVICAL SPINE FINDINGS Alignment: No acute subluxation. There is grade 1 C7-T1 anterolisthesis. Skull base and vertebrae: No acute fracture. There is advanced osteopenia. Soft tissues and spinal canal: No prevertebral fluid or swelling. No visible canal hematoma. Disc levels: C6-C7 ankylosis. There is multilevel degenerative changes with disc space narrowing and endplate irregularity. Multilevel facet hypertrophy. Upper chest: Emphysema. Other: None IMPRESSION: 1. No acute intracranial hemorrhage. Moderate age-related atrophy and chronic microvascular ischemic changes. 2. No acute/traumatic cervical spine pathology. Multilevel degenerative changes. Electronically Signed   By: Anner Crete M.D.   On: 06/05/2018 06:47  Ct Cervical Spine Wo Contrast  Result Date: 06/05/2018 CLINICAL DATA:  84 year old male with C-spine trauma. EXAM: CT HEAD WITHOUT CONTRAST CT CERVICAL SPINE WITHOUT CONTRAST TECHNIQUE: Multidetector CT imaging of the head and cervical spine was performed following the standard protocol without intravenous contrast. Multiplanar CT image reconstructions of the cervical spine were also generated. COMPARISON:  Head CT dated 05/11/2016 FINDINGS: CT HEAD FINDINGS Brain: There is moderate age-related atrophy and chronic microvascular ischemic changes. There is no acute intracranial hemorrhage. No mass effect or midline shift. No extra-axial fluid collection. Vascular: No hyperdense vessel or unexpected calcification. Skull: Normal. Negative for fracture or focal lesion. Sinuses/Orbits: No acute finding. Other: None CT CERVICAL SPINE FINDINGS Alignment: No acute subluxation. There is grade 1 C7-T1 anterolisthesis. Skull base and vertebrae: No acute fracture. There is advanced osteopenia. Soft tissues and spinal canal: No  prevertebral fluid or swelling. No visible canal hematoma. Disc levels: C6-C7 ankylosis. There is multilevel degenerative changes with disc space narrowing and endplate irregularity. Multilevel facet hypertrophy. Upper chest: Emphysema. Other: None IMPRESSION: 1. No acute intracranial hemorrhage. Moderate age-related atrophy and chronic microvascular ischemic changes. 2. No acute/traumatic cervical spine pathology. Multilevel degenerative changes. Electronically Signed   By: Anner Crete M.D.   On: 06/05/2018 06:47   Dg Abdomen Acute W/chest  Result Date: 06/05/2018 CLINICAL DATA:  Abdominal pain tonight. EXAM: DG ABDOMEN ACUTE W/ 1V CHEST COMPARISON:  Chest radiograph May 08, 2017 FINDINGS: Cardiac silhouette is mildly enlarged and unchanged. Ectatic calcified aorta. Chronic interstitial changes without pleural effusion or focal consolidation. No pneumothorax. Old RIGHT rib fractures. Osteopenia. Bowel gas pattern is nondilated and nonobstructive. 2.3 cm coarse calcification LEFT abdomen. No intra-abdominal mass effect. Advanced vascular calcifications. Osteopenia. Severe lumbar spondylosis. IMPRESSION: 1. Mild cardiomegaly and chronic interstitial changes. 2. Nonspecific bowel gas pattern. LEFT abdomen calcification likely reflects nephrolithiasis. 3.  Aortic Atherosclerosis (ICD10-I70.0). Electronically Signed   By: Elon Alas M.D.   On: 06/05/2018 04:40   Ct Angio Abd/pel W And/or Wo Contrast  Result Date: 06/05/2018 CLINICAL DATA:  Syncopal episode.  Gastrointestinal bleeding. EXAM: CTA ABDOMEN AND PELVIS wITHOUT AND WITH CONTRAST TECHNIQUE: Multidetector CT imaging of the abdomen and pelvis was performed using the standard protocol during bolus administration of intravenous contrast. Multiplanar reconstructed images and MIPs were obtained and reviewed to evaluate the vascular anatomy. CONTRAST:  65mL ISOVUE-370 IOPAMIDOL (ISOVUE-370) INJECTION 76% COMPARISON:  Abdominal ultrasound  01/06/2018. CT abdomen pelvis January 2017. FINDINGS: VASCULAR Aorta: Diffuse atherosclerosis of the aorta. Maximal diameter of the infrarenal aorta measures 3.1 cm, increased from 2.7 cm in 2017. No sign of aortic stenosis. Celiac: 20% origin stenosis. No evidence of any extravasation in that vascular territory. SMA: 20% stenosis 1 cm beyond the origin. No evidence of extravasation in that vascular territory. Renals: Single renal artery on the right. Tiny accessory upper pole renal artery on the left. Atherosclerotic disease at both renal artery origins. 50-70% stenosis on the right. 30-50% stenosis on the left. IMA: Inferior mesenteric artery shows flow. Inflow: Normal Proximal Outflow: Bilateral iliac atherosclerotic disease. No flow limiting stenosis suspected. Common femoral arteries show atherosclerosis but no stenosis greater than 30%. Veins: Normal Review of the MIP images confirms the above findings. NON-VASCULAR Lower chest: Scarring or atelectasis at the lung bases. Cardiomegaly. Hepatobiliary: No calcified gallstones. Probable cirrhosis of the liver. No focal lesion. Pancreas: Normal Spleen: Normal Adrenals/Urinary Tract: Adrenal glands are normal. Right kidney is normal. Left kidney shows a chronic stone it in the extrarenal pelvis and UPJ  region measuring up to 2.4 cm in size with chronic fullness of the left renal collecting system. Additional nonobstructing 6 mm stone in the lower pole of the left kidney. Bladder is negative. Stomach/Bowel: Liquid material within the colon. No evidence of vascular extravasation is present. There is sigmoid diverticulosis without evidence of diverticulitis. Lymphatic: No adenopathy. Reproductive: No acute finding. Other: No free fluid or air. Musculoskeletal: Extensive chronic degenerative changes of the spine. IMPRESSION: VASCULAR Atherosclerotic disease of the aorta. Maximal diameter of the infrarenal aorta is 3.1 cm. Atherosclerosis of the branch vessels as  outlined above without acute relevant finding to the clinical presentation. NON-VASCULAR Liquid stool in the colon. I do not see any evidence of vascular extravasation identifiable by this exam. Cirrhosis of the liver. Sigmoid diverticulosis without evidence of diverticulitis. Chronic 2.4 cm stone in the left extra renal pelvis with chronic fullness of the left renal collecting system. Electronically Signed   By: Nelson Chimes M.D.   On: 06/05/2018 06:55     CBC Recent Labs  Lab 06/05/18 0339  06/06/18 1018 06/06/18 1830 06/07/18 0508 06/08/18 0012 06/08/18 0848  WBC 12.8*   < > 4.6 5.9 5.4 8.4 4.8  HGB 10.6*   < > 7.8* 7.6* 8.7* 7.8* 8.0*  HCT 34.7*   < > 25.5* 24.7* 27.1* 25.0* 25.3*  PLT 216   < > 156 164 162 183 150  MCV 102.1*   < > 101.6* 100.0 97.1 100.0 98.4  MCH 31.2   < > 31.1 30.8 31.2 31.2 31.1  MCHC 30.5   < > 30.6 30.8 32.1 31.2 31.6  RDW 13.8   < > 14.3 14.1 15.1 15.0 15.2  LYMPHSABS 3.1  --   --   --   --  0.9 1.1  MONOABS 1.2*  --   --   --   --  1.0 0.8  EOSABS 0.2  --   --   --   --  0.1 0.1  BASOSABS 0.1  --   --   --   --  0.0 0.0   < > = values in this interval not displayed.    Chemistries  Recent Labs  Lab 06/05/18 0339 06/05/18 0345 06/06/18 0605 06/08/18 0012  NA 140 141 141 140  K 4.0 4.0 3.8 3.8  CL 103 106 112* 108  CO2 22  --  23 24  GLUCOSE 209* 202* 90 123*  BUN 26* 27* 22 19  CREATININE 1.43* 1.30* 1.23 1.19  CALCIUM 8.8*  --  8.1* 8.4*  AST 30  --   --  44*  ALT 15  --   --  20  ALKPHOS 94  --   --  70  BILITOT 0.6  --   --  0.6   ------------------------------------------------------------------------------------------------------------------ No results for input(s): CHOL, HDL, LDLCALC, TRIG, CHOLHDL, LDLDIRECT in the last 72 hours.  Lab Results  Component Value Date   HGBA1C  12/13/2008    5.5 (NOTE) The ADA recommends the following therapeutic goal for glycemic control related to Hgb A1c measurement: Goal of therapy: <6.5 Hgb  A1c  Reference: American Diabetes Association: Clinical Practice Recommendations 2010, Diabetes Care, 2010, 33: (Suppl  1).    Coagulation profile Recent Labs  Lab 06/05/18 0339 06/05/18 1626 06/08/18 0012  INR 2.71 1.51 1.43    Roxan Hockey M.D on 06/08/2018 at 12:16 PM  Pager---[phone] Go to [email or web address] - password [email or web address] for contact info  [facility] - Office  [phone]

## 2018-06-08 NOTE — ED Notes (Signed)
Admitting MD at bedside.

## 2018-06-08 NOTE — ED Notes (Signed)
RN to draw blood.

## 2018-06-08 NOTE — ED Notes (Signed)
Patient's wife may be reached at [phone].

## 2018-06-08 NOTE — Progress Notes (Signed)
Helped patient into the bathroom,he has had a big bowel movement,loose dark red and foul smelling odor.

## 2018-06-08 NOTE — ED Notes (Signed)
Pt ambulated to the bathroom with minimal assist. Minor difficulty noted. Patient urinated. Pt had a bowel movement that was bright red in color.

## 2018-06-08 NOTE — ED Provider Notes (Signed)
[facility] EMERGENCY DEPARTMENT Provider Note   CSN: [id number] Arrival date & time: 06/07/18  2333     History   Chief Complaint Chief Complaint  Patient presents with  . GI Bleeding    HPI [name] [name] is a 84 y.o. male.  HPI  This is a 84 year old male with a history of atrial fibrillation, coronary artery disease, dementia, diverticulosis, hypertension who presents with bright red blood per rectum.  Patient was admitted on December 5 for the same.  At that time he was thought to have a lower diverticular bleed.  No endoscopic evaluation was undergone.  He did receive 2 units of packed red blood cells.  After 2 days, bleeding appeared to have resolved without any melena or ongoing bleeding.  Coumadin was held during this time.  Wife and son report that they went home yesterday morning and he had been doing well.  However, he had a grossly bloody stool.  Wife states that he got up and walked across the bathroom and "blood dripped everywhere."  Patient denies any abdominal pain.  He is oriented to self and place.  He denies any dizziness, chest pain, shortness of breath.  Past Medical History:  Diagnosis Date  . Atrial fibrillation ([place])   . CAD (coronary artery disease)   . Dementia ([place])   . Diverticulosis    diverticular bleed 2011  . High cholesterol   . HTN (hypertension)   . Lower GI bleed     Patient Active Problem List   Diagnosis Date Noted  . GI bleed 06/05/2018  . HTN (hypertension)   . High cholesterol   . Diverticulosis   . Dementia ([place])   . CAD (coronary artery disease)   . Lower GI bleed   . AAA (abdominal aortic aneurysm) without rupture ([place]) 01/09/2018  . Ascending aortic aneurysm ([place]) 01/09/2018  . Heart block AV second degree 07/15/2017  . Dementia arising in the senium and presenium ([place]) 07/15/2017  . Status post three vessel coronary artery bypass 07/15/2017  . Left ventricular systolic dysfunction without heart failure  05/11/2017  . Hypercholesterolemia 05/11/2017  . Long term current use of anticoagulant 08/02/2016  . CHB (complete heart block) ([place]) 06/07/2015  . Alzheimer's disease ([place]) 01/26/2015  . Dementia with behavioral disturbance ([place]) 07/28/2013  . Pacemaker 01/26/2013  . Permanent atrial fibrillation ([place]) 01/26/2013  . Second degree AV block, [place]itz type II 01/26/2013  . Coronary artery disease involving native coronary artery of native heart without angina pectoris 01/26/2013  . Dyspnea 01/26/2013    Past Surgical History:  Procedure Laterality Date  . angiolplasty    . APPENDECTOMY    . INSERT / REPLACE / [place]    . PILONIDAL CYST EXCISION    . rotator cuff surgery  1985  . TONSILLECTOMY    . triple heart bypass          Home Medications    Prior to Admission medications   Medication Sig Start Date End Date Taking? Authorizing Provider  atorvastatin (LIPITOR) 40 MG tablet Take 40 mg by mouth every evening.     [provider]  Cholecalciferol (VITAMIN D) 2000 UNITS CAPS Take 4,000 Units by mouth daily.     [provider]  donepezil (ARICEPT) 5 MG tablet Take 1 tablet (5 mg total) by mouth 2 (two) times daily. 07/15/17   Dohmeier, Asencion Partridge, MD  finasteride (PROSCAR) 5 MG tablet Take 5 mg by  mouth every evening.     [provider]  Multiple Vitamin (MULTIVITAMIN WITH MINERALS) TABS tablet Take 1 tablet by mouth daily.    [provider]    Family History Family History  Problem Relation Age of Onset  . Stroke Father   . Cervical cancer Unknown        siblings  . COPD Sister   . Dementia Brother     Social History Social History   Tobacco Use  . Smoking status: Former Smoker    Last attempt to quit: 07/02/1974    Years since quitting: 43.9  . Smokeless tobacco: Never Used  Substance Use Topics  . Alcohol use: Yes    Comment: 4.5 ounces per day  . Drug use: No     Allergies   Amiodarone and  Antihistamines, diphenhydramine-type   Review of Systems Review of Systems  Constitutional: Negative for fever.  Respiratory: Negative for shortness of breath.   Cardiovascular: Negative for chest pain.  Gastrointestinal: Positive for blood in stool. Negative for abdominal pain, nausea and vomiting.  Genitourinary: Negative for dysuria.  All other systems reviewed and are negative.    Physical Exam Updated Vital Signs BP 103/62 (BP Location: Right Arm)   Pulse 64   Temp 97.6 F (36.4 C) (Oral)   Resp 15   Ht 1.829 m (6')   Wt 77 kg   SpO2 97%   BMI 23.02 kg/m   Physical Exam  Constitutional: He is oriented to person, place, and time. He appears well-developed and well-nourished.  Elderly, frail, nontoxic-appearing  HENT:  Head: Normocephalic and atraumatic.  Eyes: Pupils are equal, round, and reactive to light.  Neck: Neck supple.  Cardiovascular: Regular rhythm and normal heart sounds.  No murmur heard. Regular rhythm  Pulmonary/Chest: Effort normal and breath sounds normal. No respiratory distress. He has no wheezes.  Abdominal: Soft. Bowel sounds are normal. There is no tenderness. There is no rebound.  Genitourinary:  Genitourinary Comments: No gross blood per rectum, dark tarry stool noted  Musculoskeletal: He exhibits no edema.  Neurological: He is alert and oriented to person, place, and time.  Skin: Skin is warm and dry.  Psychiatric: He has a normal mood and affect.  Nursing note and vitals reviewed.    ED Treatments / Results  Labs (all labs ordered are listed, but only abnormal results are displayed) Labs Reviewed  PROTIME-INR - Abnormal; Notable for the following components:      Result Value   Prothrombin Time 17.3 (*)    All other components within normal limits  CBC WITH DIFFERENTIAL/PLATELET - Abnormal; Notable for the following components:   RBC 2.50 (*)    Hemoglobin 7.8 (*)    HCT 25.0 (*)    All other components within normal limits    COMPREHENSIVE METABOLIC PANEL - Abnormal; Notable for the following components:   Glucose, Bld 123 (*)    Calcium 8.4 (*)    Total Protein 5.6 (*)    Albumin 2.3 (*)    AST 44 (*)    GFR calc non Af Amer 53 (*)    All other components within normal limits  POC OCCULT BLOOD, ED    EKG None  Radiology No results found.  Procedures Procedures (including critical care time)  Medications Ordered in ED Medications  sodium chloride 0.9 % bolus 1,000 mL (has no administration in time range)     Initial Impression / Assessment and Plan / ED Course  I have  reviewed the triage vital signs and the nursing notes.  Pertinent labs & imaging results that were available during my care of the patient were reviewed by me and considered in my medical decision making (see chart for details).     Patient presents with reported bright red blood per rectum.  Recent admission and discharge yesterday morning for the same.  He did receive transfusion while in the hospital.  He appears stable at discharge.  He has no complaints at this time.  Wife describes grossly bloody bowel movement.  Vital signs are notable for blood pressure of 103/62.  This appears to be consistent with his baseline.  He has no evidence of gross bleeding on exam but does have dark stools.  Type and screen, CBC, PT/INR obtained.  He is currently not taking his Coumadin.  CBC with a hemoglobin of 7.8.  At discharge it was 8.7.  There is some room for lab error; however, given his history of heart disease and reported bright red blood per rectum, he will need at least trending of this to ensure he does not need repeat transfusion.  Plan from GI standpoint was that if he showed evidence of rebleeding he may need a repeat colonoscopy.  He has remained hemodynamically stable.  Will not transfuse at this time.  Will admit for observation for repeat hemoglobin and possible repeat GI evaluation if any evidence of ongoing bleeding.  Final  Clinical Impressions(s) / ED Diagnoses   Final diagnoses:  Lower GI bleed    ED Discharge Orders    None       Merryl Hacker, MD 06/08/18 0128

## 2018-06-08 NOTE — H&P (Signed)
History and Physical    [name] [name] HFW:[id number] DOB: [birth year] DOA: 06/07/2018  Referring MD/NP/PA: Thayer Jew, MD PCP: [name], MD  Patient coming from: Home via EMS  Chief Complaint: Rectal bleeding  I have personally briefly reviewed patient's old medical records in [facility]   HPI: [name] [name] is a 84 y.o. male with medical history significant of HTN, A. fib, CAD, dementia, and diverticulosis; who presents with complaints of bright red blood per rectum.  History is obtained from the patient's wife.  Patient was just hospitalized from 12/5-12/7 for lower GI bleeding with acute blood loss anemia.  His initial INR was 2.71 and was reversed with FFP and vitamin K.  Patient required 2 units of packed red blood cells and at discharge hemoglobin was noted to be 8.7.  Dr. Oletta Lamas of Gastroenterology was consulted, but the patient did not undergo colonoscopy at that time as a suspect that he had a diverticular bleed complicated by Coumadin.  Patient was discharged home yesterday and had been doing fine up until about 10:30 PM when he went to use the restroom.  Wife reports seeing bright red blood in the toilet bowl.  As the patient was getting up to wash his hands he had a second bowel movement for which wife noted bright red blood and blood clots present.  Patient did not complain of abdominal pain, chest pain,  or shortness of breath.  Patient had not been put back on Coumadin.  They called Dr. Oletta Lamas office to notify him of the symptoms and they were advised to call 911.   ED Course: Patient was found to be afebrile, blood pressures soft 93/59-103/62, and all other vital signs maintained.  Labs revealed hemoglobin 7.8.  Stool guaiac was positive for blood.  Although noted to appear dark and tarry in color.  Patient was typed and screened for blood products and given 1 L of normal saline IV fluids.  TR[facility] called to admit   Review of Systems  Unable to perform ROS:  Dementia  Respiratory: Negative for shortness of breath.   Cardiovascular: Negative for chest pain.  Gastrointestinal: Positive for blood in stool. Negative for abdominal pain.    Past Medical History:  Diagnosis Date  . Atrial fibrillation ([place])   . CAD (coronary artery disease)   . Dementia ([place])   . Diverticulosis    diverticular bleed 2011  . High cholesterol   . HTN (hypertension)   . Lower GI bleed     Past Surgical History:  Procedure Laterality Date  . angiolplasty    . APPENDECTOMY    . INSERT / REPLACE / [place]    . PILONIDAL CYST EXCISION    . rotator cuff surgery  1985  . TONSILLECTOMY    . triple heart bypass       reports that he quit smoking about 43 years ago. He has never used smokeless tobacco. He reports that he drinks alcohol. He reports that he does not use drugs.  Allergies  Allergen Reactions  . Amiodarone Other (See Comments)    Had a severe lung infection in 2003  . Antihistamines, Diphenhydramine-Type Other (See Comments)    Jittery    Family History  Problem Relation Age of Onset  . Stroke Father   . Cervical cancer Unknown        siblings  . COPD Sister   . Dementia Brother     Prior to Admission  medications   Medication Sig Start Date End Date Taking? Authorizing Provider  atorvastatin (LIPITOR) 40 MG tablet Take 40 mg by mouth every evening.     [provider]  Cholecalciferol (VITAMIN D) 2000 UNITS CAPS Take 4,000 Units by mouth daily.     [provider]  donepezil (ARICEPT) 5 MG tablet Take 1 tablet (5 mg total) by mouth 2 (two) times daily. 07/15/17   Dohmeier, Asencion Partridge, MD  finasteride (PROSCAR) 5 MG tablet Take 5 mg by mouth every evening.     [provider]  Multiple Vitamin (MULTIVITAMIN WITH MINERALS) TABS tablet Take 1 tablet by mouth daily.    [provider]    Physical Exam:  Constitutional: Elderly male who appears to be in no acute distress at this  time Vitals:   06/07/18 2341 06/07/18 2345 06/08/18 0015 06/08/18 0115  BP:  (!) 93/59 (!) 99/57 (!) 102/55  Pulse:  68 (!) 59 65  Resp:  19 18 12   Temp:      TempSrc:      SpO2:  96% 99% 99%  Weight: 77 kg     Height: 6' (1.829 m)      Eyes: PERRL, lids and conjunctivae normal ENMT: Mucous membranes are moist. Posterior pharynx clear of any exudate or lesions.hard of hearing. Neck: normal, supple, no masses, no thyromegaly Respiratory: clear to auscultation bilaterally, no wheezing, no crackles. Normal respiratory effort. No accessory muscle use.  Cardiovascular: Regular rate and rhythm, no murmurs / rubs / gallops. No extremity edema. 2+ pedal pulses. No carotid bruits.  Abdomen: no tenderness, no masses palpated. No hepatosplenomegaly. Bowel sounds positive.  Musculoskeletal: no clubbing / cyanosis. No joint deformity upper and lower extremities. Good ROM, no contractures. Normal muscle tone.  Skin: no rashes, lesions, ulcers. No induration Neurologic: CN 2-12 grossly intact. Sensation intact. Strength 5/5 in all 4.  Psychiatric: Poor memory.  Alert and oriented x person and place. Normal mood.     Labs on Admission: I have personally reviewed following labs and imaging studies  CBC: Recent Labs  Lab 06/05/18 0339  06/06/18 0605 06/06/18 1018 06/06/18 1830 06/07/18 0508 06/08/18 0012  WBC 12.8*   < > 5.0 4.6 5.9 5.4 8.4  NEUTROABS 8.2*  --   --   --   --   --  6.3  HGB 10.6*   < > 7.9* 7.8* 7.6* 8.7* 7.8*  HCT 34.7*   < > 25.5* 25.5* 24.7* 27.1* 25.0*  MCV 102.1*   < > 99.2 101.6* 100.0 97.1 100.0  PLT 216   < > 151 156 164 162 183   < > = values in this interval not displayed.   Basic Metabolic Panel: Recent Labs  Lab 06/05/18 0339 06/05/18 0345 06/06/18 0605 06/08/18 0012  NA 140 141 141 140  K 4.0 4.0 3.8 3.8  CL 103 106 112* 108  CO2 22  --  23 24  GLUCOSE 209* 202* 90 123*  BUN 26* 27* 22 19  CREATININE 1.43* 1.30* 1.23 1.19  CALCIUM 8.8*  --  8.1*  8.4*   GFR: Estimated Creatinine Clearance: 44.9 mL/min (by C-G formula based on SCr of 1.19 mg/dL). Liver Function Tests: Recent Labs  Lab 06/05/18 0339 06/08/18 0012  AST 30 44*  ALT 15 20  ALKPHOS 94 70  BILITOT 0.6 0.6  PROT 6.9 5.6*  ALBUMIN 2.6* 2.3*   No results for input(s): LIPASE, AMYLASE in the last 168 hours. No results for input(s):  AMMONIA in the last 168 hours. Coagulation Profile: Recent Labs  Lab 06/05/18 0339 06/05/18 1626 06/08/18 0012  INR 2.71 1.51 1.43   Cardiac Enzymes: No results for input(s): CKTOTAL, CKMB, CKMBINDEX, TROPONINI in the last 168 hours. BNP (last 3 results) No results for input(s): PROBNP in the last 8760 hours. HbA1C: No results for input(s): HGBA1C in the last 72 hours. CBG: No results for input(s): GLUCAP in the last 168 hours. Lipid Profile: No results for input(s): CHOL, HDL, LDLCALC, TRIG, CHOLHDL, LDLDIRECT in the last 72 hours. Thyroid Function Tests: No results for input(s): TSH, T4TOTAL, FREET4, T3FREE, THYROIDAB in the last 72 hours. Anemia Panel: No results for input(s): VITAMINB12, FOLATE, FERRITIN, TIBC, IRON, RETICCTPCT in the last 72 hours. Urine analysis:    Component Value Date/Time   COLORURINE YELLOW 11/21/2009 1617   APPEARANCEUR CLEAR 11/21/2009 1617   LABSPEC 1.017 11/21/2009 1617   PHURINE 6.0 11/21/2009 1617   GLUCOSEU NEGATIVE 11/21/2009 1617   HGBUR SMALL (A) 11/21/2009 1617   BILIRUBINUR NEGATIVE 11/21/2009 1617   KETONESUR NEGATIVE 11/21/2009 1617   PROTEINUR NEGATIVE 11/21/2009 1617   UROBILINOGEN 0.2 11/21/2009 1617   NITRITE NEGATIVE 11/21/2009 1617   LEUKOCYTESUR NEGATIVE 11/21/2009 1617   Sepsis Labs: Recent Results (from the past 240 hour(s))  Blood culture (routine x 2)     Status: None (Preliminary result)   Collection Time: 06/05/18  3:40 AM  Result Value Ref Range Status   Specimen Description BLOOD RIGHT ANTECUBITAL  Final   Special Requests   Final    BOTTLES DRAWN AEROBIC  AND ANAEROBIC Blood Culture results may not be optimal due to an inadequate volume of blood received in culture bottles   Culture   Final    NO GROWTH 2 DAYS Performed at [facility], [place] [address] [place], [place] [address]    Report Status PENDING  Incomplete  Blood culture (routine x 2)     Status: None (Preliminary result)   Collection Time: 06/05/18  4:09 AM  Result Value Ref Range Status   Specimen Description BLOOD RIGHT FOREARM  Final   Special Requests   Final    BOTTLES DRAWN AEROBIC AND ANAEROBIC Blood Culture adequate volume   Culture   Final    NO GROWTH 2 DAYS Performed at [facility], [place] [address], [place], [place] [address]    Report Status PENDING  Incomplete     Radiological Exams on Admission: No results found.    Assessment/Plan GI bleed with acute blood loss anemia: Patient presents again with reports of bleeding.  Hemoglobin dropped from 8.7 at discharge down to 7.8.  Blood pressures were noted to be soft on admission, but patient creatinine appears in no acute distress.  INR noted to be 1.5 similar to previous.  Less likely upper GI bleed. - Admit to a telemetry bed - N.p.o. - Transfuse 1 unit of packed red blood cell - Recheck CBC posttransfusion - Goal hemoglobin 8 - Wiill need to consult Dr. Oletta Lamas in a.m.  Wife reports already making him aware.  Atrial fibrillation: Chronic.  Patient is currently rate controlled without medication.  Currently not on anticoagulation due to recent bleeding episode.  Alzheimer's dementia: Chronic. - Continue Aricept  Complete heart block s/p pacemaker  Hyperlipidemia - Continue Lipitor  BPH  - continue finasteride  DVT prophylaxis: SCDs Code Status: DNR Family Communication: Discussed plan of care with the patient family present at bedside Disposition Plan: Likely discharge home once medically  Consults called: none  Admission status: Observation  Norval Morton MD [facility] Pager [phone]   If 7PM-7AM, please contact night-coverage [email or web address] Password [email or web address]  06/08/2018, 1:56 AM

## 2018-06-08 NOTE — Progress Notes (Signed)
[facility] PROGRESS NOTE Subjective 84 year old patient of Dr. Joaquim Lai was admitted recently.  Is seen Dr. Cristina Gong in the past colonoscopy with GI bleeding 2011 showing band.  Small bowel has had prior colonoscopies.spelt is anticoagulated stop.  Seems to [place] Coumadin given vitamin K.  Seen by cardiology and is a problem.  Was on liquids.  Has been itching does have a pacemaker.  Is tolerating home yesterday Hemoglobin has dropped from 12 down to about 8 stable.  His wife called me last night and said he was passing large bloody bowel.  Told her to have been brought back to the emergency room.  His BUN and creatinine are normal hemoglobin is actually fairly stable.  Patient denies any positive Objective: Vital signs in last 24 hours: Temp:  [97.6 F (36.4 C)-98.4 F (36.9 C)] 98 F (36.7 C) (12/08 0615) Pulse Rate:  [59-69] 65 (12/08 1045) Resp:  [12-24] 19 (12/08 1045) BP: (93-122)/(50-87) 122/72 (12/08 1045) SpO2:  [96 %-100 %] 98 % (12/08 1045) Weight:  [77 kg] 77 kg (12/07 2341)    Intake/Output from previous day: 12/07 0701 - 12/08 0700 In: 1480 [Blood:480; IV Piggyback:1000] Out: -  Intake/Output this shift: No intake/output data recorded.  PE: General--pleasantly confused white male in no distress  Lungs--clear  Abdomen--soft nontender  Lab Results: Recent Labs    06/06/18 1018 06/06/18 1830 06/07/18 0508 06/08/18 0012 06/08/18 0848  WBC 4.6 5.9 5.4 8.4 4.8  HGB 7.8* 7.6* 8.7* 7.8* 8.0*  HCT 25.5* 24.7* 27.1* 25.0* 25.3*  PLT 156 164 162 183 150   BMET Recent Labs    06/06/18 0605 06/08/18 0012  NA 141 140  K 3.8 3.8  CL 112* 108  CO2 23 24  CREATININE 1.23 1.19   LFT Recent Labs    06/08/18 0012  PROT 5.6*  AST 44*  ALT 20  ALKPHOS 70  BILITOT 0.6   PT/INR Recent Labs    06/05/18 1626 06/08/18 0012  LABPROT 18.0* 17.3*  INR 1.51 1.43   PANCREAS No results for input(s): LIPASE in the last 72  hours.       Studies/Results: No results found.  Medications: I have reviewed the patient's current medications.  Assessment:   1.  GI bleed probably lower GI bleed.  Patient's BUN and creatinine are normal no signs of hematemesis, known diverticulosis   Plan: 1.  Place the patient on clear liquid observe clinically 2.  Agree with transfusion to keep hemoglobin over 8.0 3.  6.   Nancy Fetter 06/08/2018, 11:01 AM  This note was created using voice recognition software. Minor errors may Have occurred unintentionally.  Pager: [phone] If no answer or after hours call [phone]

## 2018-06-08 NOTE — Progress Notes (Signed)
Paged Dr. Denton Brick about Hemoglobin 7.9. Waiting for new orders. Continue to monitor.

## 2018-06-09 DIAGNOSIS — K5791 Diverticulosis of intestine, part unspecified, without perforation or abscess with bleeding: Secondary | ICD-10-CM | POA: Diagnosis not present

## 2018-06-09 DIAGNOSIS — N401 Enlarged prostate with lower urinary tract symptoms: Secondary | ICD-10-CM | POA: Diagnosis present

## 2018-06-09 DIAGNOSIS — F0280 Dementia in other diseases classified elsewhere without behavioral disturbance: Secondary | ICD-10-CM | POA: Diagnosis present

## 2018-06-09 DIAGNOSIS — G301 Alzheimer's disease with late onset: Secondary | ICD-10-CM | POA: Diagnosis not present

## 2018-06-09 DIAGNOSIS — Z8049 Family history of malignant neoplasm of other genital organs: Secondary | ICD-10-CM | POA: Diagnosis not present

## 2018-06-09 DIAGNOSIS — K625 Hemorrhage of anus and rectum: Secondary | ICD-10-CM | POA: Diagnosis present

## 2018-06-09 DIAGNOSIS — Z9861 Coronary angioplasty status: Secondary | ICD-10-CM | POA: Diagnosis not present

## 2018-06-09 DIAGNOSIS — Z818 Family history of other mental and behavioral disorders: Secondary | ICD-10-CM | POA: Diagnosis not present

## 2018-06-09 DIAGNOSIS — Z66 Do not resuscitate: Secondary | ICD-10-CM | POA: Diagnosis present

## 2018-06-09 DIAGNOSIS — Z950 Presence of cardiac pacemaker: Secondary | ICD-10-CM | POA: Diagnosis not present

## 2018-06-09 DIAGNOSIS — F0281 Dementia in other diseases classified elsewhere with behavioral disturbance: Secondary | ICD-10-CM | POA: Diagnosis not present

## 2018-06-09 DIAGNOSIS — K5790 Diverticulosis of intestine, part unspecified, without perforation or abscess without bleeding: Secondary | ICD-10-CM | POA: Diagnosis not present

## 2018-06-09 DIAGNOSIS — D500 Iron deficiency anemia secondary to blood loss (chronic): Secondary | ICD-10-CM | POA: Diagnosis not present

## 2018-06-09 DIAGNOSIS — K921 Melena: Secondary | ICD-10-CM | POA: Diagnosis not present

## 2018-06-09 DIAGNOSIS — D62 Acute posthemorrhagic anemia: Secondary | ICD-10-CM | POA: Diagnosis present

## 2018-06-09 DIAGNOSIS — Z9049 Acquired absence of other specified parts of digestive tract: Secondary | ICD-10-CM | POA: Diagnosis not present

## 2018-06-09 DIAGNOSIS — E785 Hyperlipidemia, unspecified: Secondary | ICD-10-CM | POA: Diagnosis present

## 2018-06-09 DIAGNOSIS — K7460 Unspecified cirrhosis of liver: Secondary | ICD-10-CM | POA: Diagnosis present

## 2018-06-09 DIAGNOSIS — I4821 Permanent atrial fibrillation: Secondary | ICD-10-CM | POA: Diagnosis present

## 2018-06-09 DIAGNOSIS — Z87891 Personal history of nicotine dependence: Secondary | ICD-10-CM | POA: Diagnosis not present

## 2018-06-09 DIAGNOSIS — Z9181 History of falling: Secondary | ICD-10-CM | POA: Diagnosis not present

## 2018-06-09 DIAGNOSIS — I2510 Atherosclerotic heart disease of native coronary artery without angina pectoris: Secondary | ICD-10-CM | POA: Diagnosis present

## 2018-06-09 DIAGNOSIS — G309 Alzheimer's disease, unspecified: Secondary | ICD-10-CM | POA: Diagnosis present

## 2018-06-09 DIAGNOSIS — I714 Abdominal aortic aneurysm, without rupture: Secondary | ICD-10-CM | POA: Diagnosis present

## 2018-06-09 DIAGNOSIS — K5731 Diverticulosis of large intestine without perforation or abscess with bleeding: Secondary | ICD-10-CM | POA: Diagnosis present

## 2018-06-09 DIAGNOSIS — Z9089 Acquired absence of other organs: Secondary | ICD-10-CM | POA: Diagnosis not present

## 2018-06-09 DIAGNOSIS — Z7901 Long term (current) use of anticoagulants: Secondary | ICD-10-CM | POA: Diagnosis not present

## 2018-06-09 DIAGNOSIS — N200 Calculus of kidney: Secondary | ICD-10-CM | POA: Diagnosis present

## 2018-06-09 DIAGNOSIS — Z951 Presence of aortocoronary bypass graft: Secondary | ICD-10-CM | POA: Diagnosis not present

## 2018-06-09 DIAGNOSIS — Z825 Family history of asthma and other chronic lower respiratory diseases: Secondary | ICD-10-CM | POA: Diagnosis not present

## 2018-06-09 DIAGNOSIS — Z823 Family history of stroke: Secondary | ICD-10-CM | POA: Diagnosis not present

## 2018-06-09 LAB — PREPARE RBC (CROSSMATCH)

## 2018-06-09 LAB — CBC
HCT: 22.4 % — ABNORMAL LOW (ref 39.0–52.0)
Hemoglobin: 7.1 g/dL — ABNORMAL LOW (ref 13.0–17.0)
MCH: 30.5 pg (ref 26.0–34.0)
MCHC: 31.7 g/dL (ref 30.0–36.0)
MCV: 96.1 fL (ref 80.0–100.0)
Platelets: 154 10*3/uL (ref 150–400)
RBC: 2.33 MIL/uL — ABNORMAL LOW (ref 4.22–5.81)
RDW: 15.2 % (ref 11.5–15.5)
WBC: 5.9 10*3/uL (ref 4.0–10.5)
nRBC: 0 % (ref 0.0–0.2)

## 2018-06-09 MED ORDER — FUROSEMIDE 10 MG/ML IJ SOLN
40.0000 mg | Freq: Once | INTRAMUSCULAR | Status: AC
  Administered 2018-06-09: 40 mg via INTRAVENOUS
  Filled 2018-06-09: qty 4

## 2018-06-09 MED ORDER — SODIUM CHLORIDE 0.9% IV SOLUTION: Freq: Once | INTRAVENOUS | Status: DC

## 2018-06-09 NOTE — Progress Notes (Signed)
[name] [name] 5:04 PM  Subjective: Patient without any signs of bleeding today per his wife and is tolerating clear liquids in his hospital computer chart was reviewed and case discussed with my partner Dr. Oletta Lamas he has no other complaints  Objective: Vital signs stable afebrile no acute distress abdomen is soft nontender hemoglobin slight drop  Assessment: Probable diverticular bleeding  Plan: Continue present management will check on tomorrow but if no further bleeding hopefully can advance diet but will need to decide the timing of reinstituting his Coumadin and a decision about whether that is in his best interest or not will be needed  [facility] E  Pager [phone] After 5PM or if no answer call [phone]

## 2018-06-09 NOTE — Care Management Note (Signed)
Case Management Note  Patient Details  Name: [name] [name] MRN: [id number] Date of Birth: 1926-04-15  Subjective/Objective:          GI Bleed          Action/Plan: PTA home with spouse. Readmitted less than 24 hours. Hemoglobin less than 8. Getting total of 3 units PRBCs. Spoke with wife and patient at bedside. [facility] had called Sunday morning, but patient had been readmitted. Wife asked about adding in [facility] aide. Wife would like to continue with [facility] as previously chosen. Notified Butch Penny with [facility] for PT, aide. Will start care within 24-48 hours of transition home. Will need home health orders and face to face prior to transition home. Will continue to follow for transition to home needs.   Expected Discharge Date:                  Expected Discharge Plan:  [place]  In-House Referral:  NA  Discharge planning Services  CM Consult  Post Acute Care Choice:  Home Health Choice offered to:  Patient, Spouse  DME Arranged:    DME Agency:     HH Arranged:    HH Agency:     Status of Service:     If discussed at [employer] of Stay Meetings, dates discussed:    Additional Comments:  Bartholomew Crews, RN 06/09/2018, 12:59 PM

## 2018-06-09 NOTE — Progress Notes (Signed)
Patient Demographics:    [name] [name], is a 84 y.o. male, DOB - June 21, 1926, ZMO:[id number]  Admit date - 06/07/2018   Admitting Physician Norval Morton, MD  Outpatient Primary MD for the patient is [name] Manes, MD  LOS - 0   Chief Complaint  Patient presents with  . GI Bleeding        Subjective:    [name] [name] today has no fevers, no emesis,  No chest pain, wife at bedside, no further bright red blood per rectum, patient is forgetful  Assessment  & Plan :    Active Problems:   Pacemaker   Permanent atrial fibrillation (HCC)   Alzheimer's disease ([place])   GI bleed   Acute blood loss anemia  Brief summary 84 y.o.malewith history of atrial fibrillation on Coumadin, history of third-degree AV block requiring PPM implantation, dementia, hypertension, dyslipidemia presenting with diverticular bleeding readmitted on 06/08/2018 with acute blood loss anemia been recently discharged on 06/07/2018 with the same   Plan:- 1)Acute on chronic anemia secondary to acute blood loss from presumed diverticular bleeding----patient was transfused 1 unit of packed cells on 06/08/2018 (received 2 units of packed cells during recent admission), Hgb is currently 7.9, patient had bright red blood per rectum overnight,  GI consult from [facility] appreciated, transfuse additional unit of platelets in anticipation of further drop in H&H, given recent bright red blood per rectum which may not have reflected in recent H&H  2)Chronic atrial fibrillation--- Coumadin currently on hold due to recurrent diverticular GI bleed as above #1, status post prior pacemaker placement for second/third-degree AV block.... Patient usually follows with Dr. Sallyanne Kuster  3)Possible CT evidence of liver cirrhosis----this may affect the patient's coagulation profile, patient to follow-up with GI as outpatient for further management... No evidence of  hepatic encephalopathy at this time  4)CAD--post prior CABG----no chest pains, last known EF 50 to 55% continue Lipitor, Coumadin on hold due to #1 above  5)BPH with LUTs--stable,  continue Finasteride 5 mg qpm  6)Dementia--stable, cognitive deficits noted, no evidence of significant delirium at this time, continue Aricept   Disposition/Need for in-Hospital Stay- patient unable to be discharged at this time due to recurrent GI bleed requiring transfusion  Code Status : DNR   Disposition Plan  : TBD  Consults  :  Gi  DVT Prophylaxis  :  SCDs   Lab Results  Component Value Date   PLT 154 06/09/2018    Inpatient Medications  Scheduled Meds: . sodium chloride   Intravenous Once  . atorvastatin  40 mg Oral QPM  . donepezil  5 mg Oral BID  . finasteride  5 mg Oral QPM  . pantoprazole (PROTONIX) IV  40 mg Intravenous Q12H  . polyethylene glycol  17 g Oral BID   Continuous Infusions: . sodium chloride 40 mL/hr at 06/08/18 1753   PRN Meds:.acetaminophen **OR** acetaminophen, albuterol, ondansetron **OR** ondansetron (ZOFRAN) IV    Anti-infectives (From admission, onward)   None        Objective:   Vitals:   06/09/18 1154 06/09/18 1431 06/09/18 1610 06/09/18 1625  BP: 121/60 110/69 121/68 (!) 135/93  Pulse: 66 64 63 71  Resp: 18 18 18 18   Temp: 98.3 F (36.8 C) 98.4  F (36.9 C) 99.3 F (37.4 C) 99.4 F (37.4 C)  TempSrc: Oral Oral Oral Oral  SpO2: 98% 98% 98% 97%  Weight:      Height:        Wt Readings from Last 3 Encounters:  06/08/18 73.5 kg  06/05/18 77.1 kg  01/09/18 70.8 kg     Intake/Output Summary (Last 24 hours) at 06/09/2018 1751 Last data filed at 06/09/2018 1620 Gross per 24 hour  Intake 1287.82 ml  Output 200 ml  Net 1087.82 ml     Physical Exam Patient is examined daily including today on 06/09/18 , exams remain the same as of yesterday except that has changed   Gen:- Awake Alert,  In no apparent distress  HEENT:- [place].AT, No sclera  icterus Neck-Supple Neck,No JVD,.  Lungs-  CTAB , fair symmetrical air movement CV- S1, S2 normal, regular , right subclavian area with pacemaker in situ, prior CABG scar noted Abd-  +ve B.Sounds, Abd Soft, No tenderness,    Extremity/Skin:- No  edema, pedal pulses present  Psych-affect is appropriate, some cognitive deficits,   neuro-no new focal deficits, no tremors   Data Review:   Micro Results Recent Results (from the past 240 hour(s))  Blood culture (routine x 2)     Status: None (Preliminary result)   Collection Time: 06/05/18  3:40 AM  Result Value Ref Range Status   Specimen Description BLOOD RIGHT ANTECUBITAL  Final   Special Requests   Final    BOTTLES DRAWN AEROBIC AND ANAEROBIC Blood Culture results may not be optimal due to an inadequate volume of blood received in culture bottles   Culture   Final    NO GROWTH 4 DAYS Performed at [facility], [place] [address] [place], [place] [address]    Report Status PENDING  Incomplete  Blood culture (routine x 2)     Status: None (Preliminary result)   Collection Time: 06/05/18  4:09 AM  Result Value Ref Range Status   Specimen Description BLOOD RIGHT FOREARM  Final   Special Requests   Final    BOTTLES DRAWN AEROBIC AND ANAEROBIC Blood Culture adequate volume   Culture   Final    NO GROWTH 4 DAYS Performed at [facility], [place] [address], [place], [place] [address]    Report Status PENDING  Incomplete    Radiology Reports Ct Head Wo Contrast  Result Date: 06/05/2018 CLINICAL DATA:  84 year old male with C-spine trauma. EXAM: CT HEAD WITHOUT CONTRAST CT CERVICAL SPINE WITHOUT CONTRAST TECHNIQUE: Multidetector CT imaging of the head and cervical spine was performed following the standard protocol without intravenous contrast. Multiplanar CT image reconstructions of the cervical spine were also generated. COMPARISON:  Head CT dated 05/11/2016 FINDINGS: CT HEAD FINDINGS Brain: There is moderate age-related atrophy  and chronic microvascular ischemic changes. There is no acute intracranial hemorrhage. No mass effect or midline shift. No extra-axial fluid collection. Vascular: No hyperdense vessel or unexpected calcification. Skull: Normal. Negative for fracture or focal lesion. Sinuses/Orbits: No acute finding. Other: None CT CERVICAL SPINE FINDINGS Alignment: No acute subluxation. There is grade 1 C7-T1 anterolisthesis. Skull base and vertebrae: No acute fracture. There is advanced osteopenia. Soft tissues and spinal canal: No prevertebral fluid or swelling. No visible canal hematoma. Disc levels: C6-C7 ankylosis. There is multilevel degenerative changes with disc space narrowing and endplate irregularity. Multilevel facet hypertrophy. Upper chest: Emphysema. Other: None IMPRESSION: 1. No acute intracranial hemorrhage. Moderate age-related atrophy and chronic microvascular ischemic  changes. 2. No acute/traumatic cervical spine pathology. Multilevel degenerative changes. Electronically Signed   By: Anner Crete M.D.   On: 06/05/2018 06:47   Ct Cervical Spine Wo Contrast  Result Date: 06/05/2018 CLINICAL DATA:  84 year old male with C-spine trauma. EXAM: CT HEAD WITHOUT CONTRAST CT CERVICAL SPINE WITHOUT CONTRAST TECHNIQUE: Multidetector CT imaging of the head and cervical spine was performed following the standard protocol without intravenous contrast. Multiplanar CT image reconstructions of the cervical spine were also generated. COMPARISON:  Head CT dated 05/11/2016 FINDINGS: CT HEAD FINDINGS Brain: There is moderate age-related atrophy and chronic microvascular ischemic changes. There is no acute intracranial hemorrhage. No mass effect or midline shift. No extra-axial fluid collection. Vascular: No hyperdense vessel or unexpected calcification. Skull: Normal. Negative for fracture or focal lesion. Sinuses/Orbits: No acute finding. Other: None CT CERVICAL SPINE FINDINGS Alignment: No acute subluxation. There is grade  1 C7-T1 anterolisthesis. Skull base and vertebrae: No acute fracture. There is advanced osteopenia. Soft tissues and spinal canal: No prevertebral fluid or swelling. No visible canal hematoma. Disc levels: C6-C7 ankylosis. There is multilevel degenerative changes with disc space narrowing and endplate irregularity. Multilevel facet hypertrophy. Upper chest: Emphysema. Other: None IMPRESSION: 1. No acute intracranial hemorrhage. Moderate age-related atrophy and chronic microvascular ischemic changes. 2. No acute/traumatic cervical spine pathology. Multilevel degenerative changes. Electronically Signed   By: Anner Crete M.D.   On: 06/05/2018 06:47   Dg Abdomen Acute W/chest  Result Date: 06/05/2018 CLINICAL DATA:  Abdominal pain tonight. EXAM: DG ABDOMEN ACUTE W/ 1V CHEST COMPARISON:  Chest radiograph May 08, 2017 FINDINGS: Cardiac silhouette is mildly enlarged and unchanged. Ectatic calcified aorta. Chronic interstitial changes without pleural effusion or focal consolidation. No pneumothorax. Old RIGHT rib fractures. Osteopenia. Bowel gas pattern is nondilated and nonobstructive. 2.3 cm coarse calcification LEFT abdomen. No intra-abdominal mass effect. Advanced vascular calcifications. Osteopenia. Severe lumbar spondylosis. IMPRESSION: 1. Mild cardiomegaly and chronic interstitial changes. 2. Nonspecific bowel gas pattern. LEFT abdomen calcification likely reflects nephrolithiasis. 3.  Aortic Atherosclerosis (ICD10-I70.0). Electronically Signed   By: Elon Alas M.D.   On: 06/05/2018 04:40   Ct Angio Abd/pel W And/or Wo Contrast  Result Date: 06/05/2018 CLINICAL DATA:  Syncopal episode.  Gastrointestinal bleeding. EXAM: CTA ABDOMEN AND PELVIS wITHOUT AND WITH CONTRAST TECHNIQUE: Multidetector CT imaging of the abdomen and pelvis was performed using the standard protocol during bolus administration of intravenous contrast. Multiplanar reconstructed images and MIPs were obtained and reviewed to  evaluate the vascular anatomy. CONTRAST:  23mL ISOVUE-370 IOPAMIDOL (ISOVUE-370) INJECTION 76% COMPARISON:  Abdominal ultrasound 01/06/2018. CT abdomen pelvis January 2017. FINDINGS: VASCULAR Aorta: Diffuse atherosclerosis of the aorta. Maximal diameter of the infrarenal aorta measures 3.1 cm, increased from 2.7 cm in 2017. No sign of aortic stenosis. Celiac: 20% origin stenosis. No evidence of any extravasation in that vascular territory. SMA: 20% stenosis 1 cm beyond the origin. No evidence of extravasation in that vascular territory. Renals: Single renal artery on the right. Tiny accessory upper pole renal artery on the left. Atherosclerotic disease at both renal artery origins. 50-70% stenosis on the right. 30-50% stenosis on the left. IMA: Inferior mesenteric artery shows flow. Inflow: Normal Proximal Outflow: Bilateral iliac atherosclerotic disease. No flow limiting stenosis suspected. Common femoral arteries show atherosclerosis but no stenosis greater than 30%. Veins: Normal Review of the MIP images confirms the above findings. NON-VASCULAR Lower chest: Scarring or atelectasis at the lung bases. Cardiomegaly. Hepatobiliary: No calcified gallstones. Probable cirrhosis of the liver. No focal lesion. Pancreas:  Normal Spleen: Normal Adrenals/Urinary Tract: Adrenal glands are normal. Right kidney is normal. Left kidney shows a chronic stone it in the extrarenal pelvis and UPJ region measuring up to 2.4 cm in size with chronic fullness of the left renal collecting system. Additional nonobstructing 6 mm stone in the lower pole of the left kidney. Bladder is negative. Stomach/Bowel: Liquid material within the colon. No evidence of vascular extravasation is present. There is sigmoid diverticulosis without evidence of diverticulitis. Lymphatic: No adenopathy. Reproductive: No acute finding. Other: No free fluid or air. Musculoskeletal: Extensive chronic degenerative changes of the spine. IMPRESSION: VASCULAR  Atherosclerotic disease of the aorta. Maximal diameter of the infrarenal aorta is 3.1 cm. Atherosclerosis of the branch vessels as outlined above without acute relevant finding to the clinical presentation. NON-VASCULAR Liquid stool in the colon. I do not see any evidence of vascular extravasation identifiable by this exam. Cirrhosis of the liver. Sigmoid diverticulosis without evidence of diverticulitis. Chronic 2.4 cm stone in the left extra renal pelvis with chronic fullness of the left renal collecting system. Electronically Signed   By: Nelson Chimes M.D.   On: 06/05/2018 06:55     CBC Recent Labs  Lab 06/05/18 0339  06/07/18 0508 06/08/18 0012 06/08/18 0848 06/08/18 1703 06/09/18 0200  WBC 12.8*   < > 5.4 8.4 4.8 6.2 5.9  HGB 10.6*   < > 8.7* 7.8* 8.0* 7.9* 7.1*  HCT 34.7*   < > 27.1* 25.0* 25.3* 24.4* 22.4*  PLT 216   < > 162 183 150 155 154  MCV 102.1*   < > 97.1 100.0 98.4 96.1 96.1  MCH 31.2   < > 31.2 31.2 31.1 31.1 30.5  MCHC 30.5   < > 32.1 31.2 31.6 32.4 31.7  RDW 13.8   < > 15.1 15.0 15.2 15.4 15.2  LYMPHSABS 3.1  --   --  0.9 1.1  --   --   MONOABS 1.2*  --   --  1.0 0.8  --   --   EOSABS 0.2  --   --  0.1 0.1  --   --   BASOSABS 0.1  --   --  0.0 0.0  --   --    < > = values in this interval not displayed.    Chemistries  Recent Labs  Lab 06/05/18 0339 06/05/18 0345 06/06/18 0605 06/08/18 0012  NA 140 141 141 140  K 4.0 4.0 3.8 3.8  CL 103 106 112* 108  CO2 22  --  23 24  GLUCOSE 209* 202* 90 123*  BUN 26* 27* 22 19  CREATININE 1.43* 1.30* 1.23 1.19  CALCIUM 8.8*  --  8.1* 8.4*  AST 30  --   --  44*  ALT 15  --   --  20  ALKPHOS 94  --   --  70  BILITOT 0.6  --   --  0.6   ------------------------------------------------------------------------------------------------------------------ No results for input(s): CHOL, HDL, LDLCALC, TRIG, CHOLHDL, LDLDIRECT in the last 72 hours.  Lab Results  Component Value Date   HGBA1C  12/13/2008    5.5 (NOTE)  The ADA recommends the following therapeutic goal for glycemic control related to Hgb A1c measurement: Goal of therapy: <6.5 Hgb A1c  Reference: American Diabetes Association: Clinical Practice Recommendations 2010, Diabetes Care, 2010, 33: (Suppl  1).    Coagulation profile Recent Labs  Lab 06/05/18 0339 06/05/18 1626 06/08/18 0012  INR 2.71 1.51 1.43    Melek Pownall  M.D on 06/09/2018 at 5:51 PM  Pager---[phone] Go to [email or web address] - [email or web address] for contact info  [facility] - Office  [phone]

## 2018-06-10 LAB — CULTURE, BLOOD (ROUTINE X 2)
Culture: NO GROWTH
Culture: NO GROWTH
SPECIAL REQUESTS: ADEQUATE

## 2018-06-10 LAB — TYPE AND SCREEN
ABO/RH(D): O POS
Antibody Screen: NEGATIVE
UNIT DIVISION: 0
Unit division: 0
Unit division: 0

## 2018-06-10 LAB — CBC
HCT: 30 % — ABNORMAL LOW (ref 39.0–52.0)
Hemoglobin: 10 g/dL — ABNORMAL LOW (ref 13.0–17.0)
MCH: 31.2 pg (ref 26.0–34.0)
MCHC: 33.3 g/dL (ref 30.0–36.0)
MCV: 93.5 fL (ref 80.0–100.0)
PLATELETS: 169 10*3/uL (ref 150–400)
RBC: 3.21 MIL/uL — ABNORMAL LOW (ref 4.22–5.81)
RDW: 15.6 % — ABNORMAL HIGH (ref 11.5–15.5)
WBC: 6.1 10*3/uL (ref 4.0–10.5)
nRBC: 0 % (ref 0.0–0.2)

## 2018-06-10 LAB — BPAM RBC
Blood Product Expiration Date: 201912312359
Blood Product Expiration Date: 202001012359
Blood Product Expiration Date: 202001022359
ISSUE DATE / TIME: 201912080314
ISSUE DATE / TIME: 201912091134
ISSUE DATE / TIME: 201912091606
UNIT TYPE AND RH: 5100
UNIT TYPE AND RH: 5100
Unit Type and Rh: 5100

## 2018-06-10 NOTE — Progress Notes (Signed)
Patient Demographics:    [name] [name], is a 84 y.o. male, DOB - [birth year], HQP:[id number]  Admit date - 06/07/2018   Admitting Physician Norval Morton, MD  Outpatient Primary MD for the patient is [name], MD  LOS - 1   Chief Complaint  Patient presents with  . GI Bleeding        Subjective:    [name] [name] today has no fevers, no emesis,  No chest pain, wife at bedside, no further bright red blood per rectum, patient is forgetful, had "green" stool, no abd pain  Assessment  & Plan :    Active Problems:   Pacemaker   Permanent atrial fibrillation (HCC)   Alzheimer's disease ([place])   GI bleed   Acute blood loss anemia  Brief Summary 84 y.o.malewith history of atrial fibrillation on Coumadin, history of third-degree AV block requiring PPM implantation, dementia, hypertension, dyslipidemia presenting with diverticular bleeding readmitted on 06/08/2018 with acute blood loss anemia been recently discharged on 06/07/2018 with the same   Plan:- 1)Acute on chronic anemia secondary to acute blood loss from presumed diverticular bleeding----patient was transfused 1 unit of packed cells on 06/08/2018 and received 2 units of PRBCs on 06/09/18 (received 2 units of packed cells during recent admission), for a total  Of 5 units of PRBCs...., Hgb is now up to 10.0, ,  GI consult from [facility] appreciated,  Patient has been on clear liquid diet until 06/10/2018, [facility] recommends advancing diet to GI soft diet on 06/10/2018 with possible discharge home on 06/11/2018 if H&H is stable and no further bright red blood per rectum  2)Chronic atrial fibrillation--- Coumadin currently on hold due to recurrent diverticular GI bleed as above #1, status post prior pacemaker placement for second/third-degree AV block.... Patient usually follows with Dr. Lolly Mustache- VASc score   is = 3 (Age and HTN), Which is   equal to = 3.2 % annual risk of stroke  This patients CHA2DS2-VASc Score and unadjusted Ischemic Stroke Rate (% per year) is equal to 3.2 % stroke rate/year from a score of 3  Given recurrent GI bleed requiring transfusion Risk versus benefit of FULL anticoagulation discussed with patient and his wife, at this time patient wife indicates that she does not want to continue Coumadin therapy so upon discharge Coumadin would not be prescribed  3)Possible CT evidence of liver cirrhosis----this may affect the patient's coagulation profile, patient to follow-up with GI as outpatient for further management... No evidence of hepatic encephalopathy at this time.... [facility] input appreciated, avoid hepatotoxic agents  4)CAD--post prior CABG----no chest pains, last known EF 50 to 55% continue Lipitor, stopped Coumadin on hold due to #2 above  5)BPH with LUTs--stable,  continue Finasteride 5 mg qpm  6)Dementia--stable, cognitive deficits noted, no evidence of significant delirium at this time, continue Aricept  7)Disposition/generalized weakness and debility with risk of falls --- PT evaluation noted, patient will need to be discharged home and home health physical therapy  Disposition/Need for in-Hospital Stay- patient unable to be discharged at this time due to recurrent GI bleed requiring transfusion  Code Status : DNR   Disposition Plan  : Home with home health physical therapy  Consults  :  Gi  DVT Prophylaxis  :  SCDs (no coumadin)  Lab Results  Component Value Date   PLT 169 06/10/2018    Inpatient Medications  Scheduled Meds: . atorvastatin  40 mg Oral QPM  . donepezil  5 mg Oral BID  . finasteride  5 mg Oral QPM  . pantoprazole (PROTONIX) IV  40 mg Intravenous Q12H  . polyethylene glycol  17 g Oral BID   Continuous Infusions: . sodium chloride 20 mL/hr at 06/10/18 1023   PRN Meds:.acetaminophen **OR** acetaminophen, albuterol, ondansetron **OR** ondansetron (ZOFRAN)  IV    Anti-infectives (From admission, onward)   None        Objective:   Vitals:   06/09/18 1625 06/09/18 2136 06/10/18 0529 06/10/18 0927  BP: (!) 135/93 127/64 112/62 119/61  Pulse: 71 62 63 65  Resp: 18 19 20 18   Temp: 99.4 F (37.4 C) 98.2 F (36.8 C) 97.8 F (36.6 C) 98.3 F (36.8 C)  TempSrc: Oral Oral Oral Oral  SpO2: 97% 95% 97% 99%  Weight:      Height:        Wt Readings from Last 3 Encounters:  06/08/18 73.5 kg  06/05/18 77.1 kg  01/09/18 70.8 kg     Intake/Output Summary (Last 24 hours) at 06/10/2018 1342 Last data filed at 06/10/2018 0900 Gross per 24 hour  Intake 1785.7 ml  Output 925 ml  Net 860.7 ml    Physical Exam Patient is examined daily including today on 06/10/18 , exams remain the same as of yesterday except that has changed   Gen:- Awake Alert,  In no apparent distress  HEENT:- [place].AT, No sclera icterus Neck-Supple Neck,No JVD,.  Lungs-  CTAB , fair symmetrical air movement CV- S1, S2 normal, regular , right subclavian area with pacemaker in situ, prior CABG scar noted Abd-  +ve B.Sounds, Abd Soft, No tenderness,    Extremity/Skin:- No  edema, pedal pulses present  Psych-affect is appropriate, some cognitive deficits,   neuro-no new focal deficits, no tremors   Data Review:   Micro Results Recent Results (from the past 240 hour(s))  Blood culture (routine x 2)     Status: None (Preliminary result)   Collection Time: 06/05/18  3:40 AM  Result Value Ref Range Status   Specimen Description BLOOD RIGHT ANTECUBITAL  Final   Special Requests   Final    BOTTLES DRAWN AEROBIC AND ANAEROBIC Blood Culture results may not be optimal due to an inadequate volume of blood received in culture bottles   Culture   Final    NO GROWTH 4 DAYS Performed at [facility], [place] [address], [place], [place] [address]    Report Status PENDING  Incomplete  Blood culture (routine x 2)     Status: None (Preliminary result)   Collection Time:  06/05/18  4:09 AM  Result Value Ref Range Status   Specimen Description BLOOD RIGHT FOREARM  Final   Special Requests   Final    BOTTLES DRAWN AEROBIC AND ANAEROBIC Blood Culture adequate volume   Culture   Final    NO GROWTH 4 DAYS Performed at [facility], [place] [address] [place], [place] [address]    Report Status PENDING  Incomplete    Radiology Reports Ct Head Wo Contrast  Result Date: 06/05/2018 CLINICAL DATA:  84 year old male with C-spine trauma. EXAM: CT HEAD WITHOUT CONTRAST CT CERVICAL SPINE WITHOUT CONTRAST TECHNIQUE: Multidetector CT imaging of the head and cervical spine was performed following the  standard protocol without intravenous contrast. Multiplanar CT image reconstructions of the cervical spine were also generated. COMPARISON:  Head CT dated 05/11/2016 FINDINGS: CT HEAD FINDINGS Brain: There is moderate age-related atrophy and chronic microvascular ischemic changes. There is no acute intracranial hemorrhage. No mass effect or midline shift. No extra-axial fluid collection. Vascular: No hyperdense vessel or unexpected calcification. Skull: Normal. Negative for fracture or focal lesion. Sinuses/Orbits: No acute finding. Other: None CT CERVICAL SPINE FINDINGS Alignment: No acute subluxation. There is grade 1 C7-T1 anterolisthesis. Skull base and vertebrae: No acute fracture. There is advanced osteopenia. Soft tissues and spinal canal: No prevertebral fluid or swelling. No visible canal hematoma. Disc levels: C6-C7 ankylosis. There is multilevel degenerative changes with disc space narrowing and endplate irregularity. Multilevel facet hypertrophy. Upper chest: Emphysema. Other: None IMPRESSION: 1. No acute intracranial hemorrhage. Moderate age-related atrophy and chronic microvascular ischemic changes. 2. No acute/traumatic cervical spine pathology. Multilevel degenerative changes. Electronically Signed   By: Anner Crete M.D.   On: 06/05/2018 06:47   Ct Cervical Spine  Wo Contrast  Result Date: 06/05/2018 CLINICAL DATA:  84 year old male with C-spine trauma. EXAM: CT HEAD WITHOUT CONTRAST CT CERVICAL SPINE WITHOUT CONTRAST TECHNIQUE: Multidetector CT imaging of the head and cervical spine was performed following the standard protocol without intravenous contrast. Multiplanar CT image reconstructions of the cervical spine were also generated. COMPARISON:  Head CT dated 05/11/2016 FINDINGS: CT HEAD FINDINGS Brain: There is moderate age-related atrophy and chronic microvascular ischemic changes. There is no acute intracranial hemorrhage. No mass effect or midline shift. No extra-axial fluid collection. Vascular: No hyperdense vessel or unexpected calcification. Skull: Normal. Negative for fracture or focal lesion. Sinuses/Orbits: No acute finding. Other: None CT CERVICAL SPINE FINDINGS Alignment: No acute subluxation. There is grade 1 C7-T1 anterolisthesis. Skull base and vertebrae: No acute fracture. There is advanced osteopenia. Soft tissues and spinal canal: No prevertebral fluid or swelling. No visible canal hematoma. Disc levels: C6-C7 ankylosis. There is multilevel degenerative changes with disc space narrowing and endplate irregularity. Multilevel facet hypertrophy. Upper chest: Emphysema. Other: None IMPRESSION: 1. No acute intracranial hemorrhage. Moderate age-related atrophy and chronic microvascular ischemic changes. 2. No acute/traumatic cervical spine pathology. Multilevel degenerative changes. Electronically Signed   By: Anner Crete M.D.   On: 06/05/2018 06:47   Dg Abdomen Acute W/chest  Result Date: 06/05/2018 CLINICAL DATA:  Abdominal pain tonight. EXAM: DG ABDOMEN ACUTE W/ 1V CHEST COMPARISON:  Chest radiograph May 08, 2017 FINDINGS: Cardiac silhouette is mildly enlarged and unchanged. Ectatic calcified aorta. Chronic interstitial changes without pleural effusion or focal consolidation. No pneumothorax. Old RIGHT rib fractures. Osteopenia. Bowel gas  pattern is nondilated and nonobstructive. 2.3 cm coarse calcification LEFT abdomen. No intra-abdominal mass effect. Advanced vascular calcifications. Osteopenia. Severe lumbar spondylosis. IMPRESSION: 1. Mild cardiomegaly and chronic interstitial changes. 2. Nonspecific bowel gas pattern. LEFT abdomen calcification likely reflects nephrolithiasis. 3.  Aortic Atherosclerosis (ICD10-I70.0). Electronically Signed   By: Elon Alas M.D.   On: 06/05/2018 04:40   Ct Angio Abd/pel W And/or Wo Contrast  Result Date: 06/05/2018 CLINICAL DATA:  Syncopal episode.  Gastrointestinal bleeding. EXAM: CTA ABDOMEN AND PELVIS wITHOUT AND WITH CONTRAST TECHNIQUE: Multidetector CT imaging of the abdomen and pelvis was performed using the standard protocol during bolus administration of intravenous contrast. Multiplanar reconstructed images and MIPs were obtained and reviewed to evaluate the vascular anatomy. CONTRAST:  44mL ISOVUE-370 IOPAMIDOL (ISOVUE-370) INJECTION 76% COMPARISON:  Abdominal ultrasound 01/06/2018. CT abdomen pelvis January 2017. FINDINGS: VASCULAR Aorta: Diffuse atherosclerosis  of the aorta. Maximal diameter of the infrarenal aorta measures 3.1 cm, increased from 2.7 cm in 2017. No sign of aortic stenosis. Celiac: 20% origin stenosis. No evidence of any extravasation in that vascular territory. SMA: 20% stenosis 1 cm beyond the origin. No evidence of extravasation in that vascular territory. Renals: Single renal artery on the right. Tiny accessory upper pole renal artery on the left. Atherosclerotic disease at both renal artery origins. 50-70% stenosis on the right. 30-50% stenosis on the left. IMA: Inferior mesenteric artery shows flow. Inflow: Normal Proximal Outflow: Bilateral iliac atherosclerotic disease. No flow limiting stenosis suspected. Common femoral arteries show atherosclerosis but no stenosis greater than 30%. Veins: Normal Review of the MIP images confirms the above findings. NON-VASCULAR  Lower chest: Scarring or atelectasis at the lung bases. Cardiomegaly. Hepatobiliary: No calcified gallstones. Probable cirrhosis of the liver. No focal lesion. Pancreas: Normal Spleen: Normal Adrenals/Urinary Tract: Adrenal glands are normal. Right kidney is normal. Left kidney shows a chronic stone it in the extrarenal pelvis and UPJ region measuring up to 2.4 cm in size with chronic fullness of the left renal collecting system. Additional nonobstructing 6 mm stone in the lower pole of the left kidney. Bladder is negative. Stomach/Bowel: Liquid material within the colon. No evidence of vascular extravasation is present. There is sigmoid diverticulosis without evidence of diverticulitis. Lymphatic: No adenopathy. Reproductive: No acute finding. Other: No free fluid or air. Musculoskeletal: Extensive chronic degenerative changes of the spine. IMPRESSION: VASCULAR Atherosclerotic disease of the aorta. Maximal diameter of the infrarenal aorta is 3.1 cm. Atherosclerosis of the branch vessels as outlined above without acute relevant finding to the clinical presentation. NON-VASCULAR Liquid stool in the colon. I do not see any evidence of vascular extravasation identifiable by this exam. Cirrhosis of the liver. Sigmoid diverticulosis without evidence of diverticulitis. Chronic 2.4 cm stone in the left extra renal pelvis with chronic fullness of the left renal collecting system. Electronically Signed   By: Nelson Chimes M.D.   On: 06/05/2018 06:55     CBC Recent Labs  Lab 06/05/18 0339  06/08/18 0012 06/08/18 0848 06/08/18 1703 06/09/18 0200 06/10/18 0355  WBC 12.8*   < > 8.4 4.8 6.2 5.9 6.1  HGB 10.6*   < > 7.8* 8.0* 7.9* 7.1* 10.0*  HCT 34.7*   < > 25.0* 25.3* 24.4* 22.4* 30.0*  PLT 216   < > 183 150 155 154 169  MCV 102.1*   < > 100.0 98.4 96.1 96.1 93.5  MCH 31.2   < > 31.2 31.1 31.1 30.5 31.2  MCHC 30.5   < > 31.2 31.6 32.4 31.7 33.3  RDW 13.8   < > 15.0 15.2 15.4 15.2 15.6*  LYMPHSABS 3.1  --  0.9  1.1  --   --   --   MONOABS 1.2*  --  1.0 0.8  --   --   --   EOSABS 0.2  --  0.1 0.1  --   --   --   BASOSABS 0.1  --  0.0 0.0  --   --   --    < > = values in this interval not displayed.    Chemistries  Recent Labs  Lab 06/05/18 0339 06/05/18 0345 06/06/18 0605 06/08/18 0012  NA 140 141 141 140  K 4.0 4.0 3.8 3.8  CL 103 106 112* 108  CO2 22  --  23 24  GLUCOSE 209* 202* 90 123*  BUN 26* 27* 22 19  CREATININE 1.43* 1.30* 1.23 1.19  CALCIUM 8.8*  --  8.1* 8.4*  AST 30  --   --  44*  ALT 15  --   --  20  ALKPHOS 94  --   --  70  BILITOT 0.6  --   --  0.6   ------------------------------------------------------------------------------------------------------------------ No results for input(s): CHOL, HDL, LDLCALC, TRIG, CHOLHDL, LDLDIRECT in the last 72 hours.  Lab Results  Component Value Date   HGBA1C  12/13/2008    5.5 (NOTE) The ADA recommends the following therapeutic goal for glycemic control related to Hgb A1c measurement: Goal of therapy: <6.5 Hgb A1c  Reference: American Diabetes Association: Clinical Practice Recommendations 2010, Diabetes Care, 2010, 33: (Suppl  1).    Coagulation profile Recent Labs  Lab 06/05/18 0339 06/05/18 1626 06/08/18 0012  INR 2.71 1.51 1.43    Roxan Hockey M.D on 06/10/2018 at 1:42 PM  Pager---[phone] Go to [email or web address] - password [email or web address] for contact info  [facility] - Office  [phone]

## 2018-06-10 NOTE — Evaluation (Signed)
Physical Therapy Evaluation Patient Details Name: [name] [name] MRN: [id number] DOB: Mar 11, 1926 Today's Date: 06/10/2018   History of Present Illness  84 y.o. male with medical history significant of HTN, A. fib, CAD, dementia, and diverticulosis; who presents with complaints of bright red blood per rectum.  History is obtained from the patient's wife.  Patient was just hospitalized from 12/5-12/7 for lower GI bleeding with acute blood loss anemia.  Clinical Impression  Orders received for PT evaluation. Patient demonstrates deficits in functional mobility as indicated below. Will benefit from continued skilled PT to address deficits and maximize function. Will see as indicated and progress as tolerated.  OF NOTE: patient with notable decline in functional status due to deconditioning over the past few days. Given history of dementia, feel SNF would be detrimental and patient would be better served for rehabiliation in his home (familiar environment). Would highly recommend HHPT with 24/7 program to bridge care for patient.      Follow Up Recommendations Home health PT;Supervision for mobility/OOB(would really benefit from [place] program vs SNF)    Equipment Recommendations  None recommended by PT    Recommendations for Other Services       Precautions / Restrictions Precautions Precautions: Fall Restrictions Weight Bearing Restrictions: No      Mobility  Bed Mobility Overal bed mobility: Needs Assistance Bed Mobility: Supine to Sit;Sit to Supine     Supine to sit: Min guard;HOB elevated Sit to supine: Min guard   General bed mobility comments: assist for safety and balance  Transfers Overall transfer level: Needs assistance Equipment used: 1 person hand held assist;Rolling walker (2 wheeled) Transfers: Sit to/from Stand Sit to Stand: Min assist         General transfer comment: Min assist for balance and stability. posterior LOB noted with multiple attempts to  reach upright  Ambulation/Gait Ambulation/Gait assistance: Min assist;Mod assist Gait Distance (Feet): 120 Feet Assistive device: Rolling walker (2 wheeled) Gait Pattern/deviations: Step-through pattern;Shuffle;Staggering right;Trunk flexed;Wide base of support Gait velocity: decreased Gait velocity interpretation: <1.8 ft/sec, indicate of risk for recurrent falls General Gait Details: patient with poor ability to navigate with RW, min assist throughout with intermittent need for moderate assist for stability  Stairs            Wheelchair Mobility    Modified Rankin (Stroke Patients Only)       Balance Overall balance assessment: Needs assistance   Sitting balance-Leahy Scale: Fair Sitting balance - Comments: due to kyphosis leans back sitting EOB on elbows to visualize his visitor     Standing balance-Leahy Scale: Poor Standing balance comment: heavy reliance on RW for upright assist                             Pertinent Vitals/Pain Pain Assessment: No/denies pain    Home Living Family/patient expects to be discharged to:: Private residence Living Arrangements: Spouse/significant other Available Help at Discharge: Family;Available PRN/intermittently(wife there 24/7 but physically may be limited) Type of Home: House Home Access: Stairs to enter Entrance Stairs-Rails: None Entrance Stairs-Number of Steps: 2 & 1 on back Home Layout: Two level Home Equipment: Grab bars - tub/shower      Prior Function Level of Independence: Independent               Hand Dominance   Dominant Hand: Right    Extremity/Trunk Assessment   Upper Extremity Assessment Upper Extremity Assessment: Generalized weakness  Lower Extremity Assessment Lower Extremity Assessment: Generalized weakness    Cervical / Trunk Assessment Cervical / Trunk Assessment: Kyphotic;Other exceptions Cervical / Trunk Exceptions: severe kyphosis with forward head   Communication    Communication: HOH  Cognition Arousal/Alertness: Awake/alert Behavior During Therapy: WFL for tasks assessed/performed Overall Cognitive Status: Within Functional Limits for tasks assessed                                        General Comments      Exercises     Assessment/Plan    PT Assessment Patient needs continued PT services  PT Problem List Decreased balance;Decreased safety awareness;Decreased mobility;Decreased knowledge of use of DME       PT Treatment Interventions DME instruction;Therapeutic activities;Therapeutic exercise;Gait training;Balance training;Stair training;Functional mobility training;Patient/family education    PT Goals (Current goals can be found in the Care Plan section)  Acute Rehab PT Goals Patient Stated Goal: to return home PT Goal Formulation: With patient/family Time For Goal Achievement: 06/24/18 Potential to Achieve Goals: Good    Frequency Min 3X/week   Barriers to discharge        Co-evaluation               AM-PAC PT "6 Clicks" Mobility  Outcome Measure Help needed turning from your back to your side while in a flat bed without using bedrails?: A Little Help needed moving from lying on your back to sitting on the side of a flat bed without using bedrails?: A Little Help needed moving to and from a bed to a chair (including a wheelchair)?: A Little Help needed standing up from a chair using your arms (e.g., wheelchair or bedside chair)?: A Little Help needed to walk in hospital room?: A Little Help needed climbing 3-5 steps with a railing? : A Lot 6 Click Score: 17    End of Session Equipment Utilized During Treatment: Gait belt Activity Tolerance: Patient tolerated treatment well Patient left: in chair;with call bell/phone within reach;with chair alarm set;with family/visitor present   PT Visit Diagnosis: Other abnormalities of gait and mobility (R26.89)    Time: 9563-8756 PT Time Calculation (min)  (ACUTE ONLY): 24 min   Charges:   PT Evaluation $PT Eval Moderate Complexity: 1 Mod PT Treatments $Gait Training: 8-22 mins        Alben Deeds, PT DPT  Board Certified Neurologic Specialist Acute Rehabilitation Services Pager [phone] Office [phone]   Duncan Dull 06/10/2018, 12:32 PM

## 2018-06-10 NOTE — Progress Notes (Signed)
[facility] Gastroenterology Progress Note  [name] [name] 84 y.o. Dec 01, 1925  CC: GI bleed   Subjective: Patient seen and examined at bedside.  Denies any GI symptoms.  Discussed with the nursing staff.  No evidence of bleeding in last 2 days.  No documented bowel movements yesterday.  ROS : Negative for active chest pain.  Negative for active shortness of breath.  Negative for bleeding   Objective: Vital signs in last 24 hours: Vitals:   06/09/18 2136 06/10/18 0529  BP: 127/64 112/62  Pulse: 62 63  Resp: 19 20  Temp: 98.2 F (36.8 C) 97.8 F (36.6 C)  SpO2: 95% 97%    Physical Exam:  General.  Elderly appearing patient, resting in the bed comfortably. Heart.  Irregular rhythm.   Abdomen.  Soft, nontender, nondistended, bowel sounds present. Psych.  Mood and affect normal. Neuro : A/O X 3    Lab Results: Recent Labs    06/08/18 0012  NA 140  K 3.8  CL 108  CO2 24  GLUCOSE 123*  BUN 19  CREATININE 1.19  CALCIUM 8.4*   Recent Labs    06/08/18 0012  AST 44*  ALT 20  ALKPHOS 70  BILITOT 0.6  PROT 5.6*  ALBUMIN 2.3*   Recent Labs    06/08/18 0012 06/08/18 0848  06/09/18 0200 06/10/18 0355  WBC 8.4 4.8   < > 5.9 6.1  NEUTROABS 6.3 2.8  --   --   --   HGB 7.8* 8.0*   < > 7.1* 10.0*  HCT 25.0* 25.3*   < > 22.4* 30.0*  MCV 100.0 98.4   < > 96.1 93.5  PLT 183 150   < > 154 169   < > = values in this interval not displayed.   Recent Labs    06/08/18 0012  LABPROT 17.3*  INR 1.43      Assessment/Plan: -GI bleed most likely lower bleed.  Probably diverticular  Last colonoscopy in 2011 showed diverticulosis. -Acute blood loss anemia.  Hgb  improved after blood transfusion. - atrial fibrillation.  Coumadin on hold. -Abnormal CT scan showing possible cirrhosis of the liver.  Patient with normal platelet counts.  Recommendations ------------------------ -Advance diet to soft. -Monitor H&H. -Reevaluate need for anticoagulation in elderly patient who  is at higher risk for recurrent GI bleed.  If anticoagulation clinically indicated, okay to start from GI standpoint. -GI will follow.  Otis Brace MD, [place] 06/10/2018, 9:15 AM  Contact #  [phone]

## 2018-06-11 ENCOUNTER — Telehealth: Payer: Self-pay | Admitting: Cardiovascular Disease

## 2018-06-11 LAB — CBC
HCT: 31.8 % — ABNORMAL LOW (ref 39.0–52.0)
HEMOGLOBIN: 10 g/dL — AB (ref 13.0–17.0)
MCH: 30 pg (ref 26.0–34.0)
MCHC: 31.4 g/dL (ref 30.0–36.0)
MCV: 95.5 fL (ref 80.0–100.0)
Platelets: 206 10*3/uL (ref 150–400)
RBC: 3.33 MIL/uL — ABNORMAL LOW (ref 4.22–5.81)
RDW: 15.2 % (ref 11.5–15.5)
WBC: 8.1 10*3/uL (ref 4.0–10.5)
nRBC: 0 % (ref 0.0–0.2)

## 2018-06-11 NOTE — Progress Notes (Signed)
[facility] Gastroenterology Progress Note  [name] [name] 84 y.o. 29-Jul-1925  CC: GI bleed   Subjective: Patient seen and examined at bedside.  Denies any GI symptoms.  Family at bedside.  One dark-colored stool yesterday but was nonbloody according to family.  He denies abdominal pain, nausea vomiting.     Objective: Vital signs in last 24 hours: Vitals:   06/11/18 0439 06/11/18 0741  BP: (!) 129/49 123/67  Pulse: 65 66  Resp: 18 18  Temp: 97.8 F (36.6 C)   SpO2: 94% 96%    Physical Exam:  General.  Elderly appearing patient, resting in the bed comfortably. Heart.  Irregular rhythm.   Abdomen.  Soft, nontender, nondistended, bowel sounds present. Psych.  Mood and affect normal. Neuro : A/O X 3    Lab Results: No results for input(s): NA, K, CL, CO2, GLUCOSE, BUN, CREATININE, CALCIUM, MG, PHOS in the last 72 hours. No results for input(s): AST, ALT, ALKPHOS, BILITOT, PROT, ALBUMIN in the last 72 hours. Recent Labs    06/10/18 0355 06/11/18 0616  WBC 6.1 8.1  HGB 10.0* 10.0*  HCT 30.0* 31.8*  MCV 93.5 95.5  PLT 169 206   No results for input(s): LABPROT, INR in the last 72 hours.    Assessment/Plan: -GI bleed most likely lower bleed.  Probably diverticular  Last colonoscopy in 2011 showed diverticulosis. -Acute blood loss anemia.  Hgb  improved after blood transfusion. - atrial fibrillation.  Coumadin discontinued -Abnormal CT scan showing possible cirrhosis of the liver.  Patient with normal platelet counts.  Recommendations ------------------------ -Advance diet to heart healthy -Family has decided not to pursue anticoagulation -Abnormal CT scan finding concerning for cirrhosis discussed with patient's wife. -Recommend follow-up with primary GI Dr. Cristina Gong in 4 weeks after discharge -GI will sign off.  Call us back if needed.  Okay to discharge from GI standpoint  Otis Brace MD, [place] 06/11/2018, 8:56 AM  Contact #  [phone]

## 2018-06-11 NOTE — Progress Notes (Signed)
Physical Therapy Treatment Patient Details Name: [name] [name] MRN: [id number] DOB: July 27, 1925 Today's Date: 06/11/2018    History of Present Illness 84 y.o. male with medical history significant of HTN, A. fib, CAD, dementia, and diverticulosis; who presents with complaints of bright red blood per rectum.  History is obtained from the patient's wife.  Patient was just hospitalized from 12/5-12/7 for lower GI bleeding with acute blood loss anemia.    PT Comments    Continuing work on functional mobility and activity tolerance;  Noting very good improvements in bed mobiltiy and progressive amb; Wife present and asked about more help in the home (specifically, bath Aide), which is quite reasonable -- communicated with RNCMs;   Wife tells me pt ascends and descends flight of stairs daily -- I encouraged them to work with HHPT/RN to assess safety on stairs in the home and consider making changes (like chair lift) for safety   Follow Up Recommendations  Home health PT;Supervision for mobility/OOB(would really benefit from [place] program; Noted she is set up with [facility], and Case Mgr will add bath aide and RN)     Equipment Recommendations  Rolling walker with 5" wheels(worth considering -- pt might not use it in the home; long term may need stair chair lift)    Recommendations for Other Services       Precautions / Restrictions Precautions Precautions: Fall Restrictions Weight Bearing Restrictions: No    Mobility  Bed Mobility Overal bed mobility: Needs Assistance Bed Mobility: Supine to Sit     Supine to sit: Min guard(flat bed)     General bed mobility comments: Minguard for safety, no need for physical assist  Transfers Overall transfer level: Needs assistance Equipment used: Rolling walker (2 wheeled) Transfers: Sit to/from Stand Sit to Stand: Min guard         General transfer comment: Noting improvements; minguard assist for safety; did not need physical assist  to rise and able to reach standing on first attempt; will need reinforcement of hand placement  Ambulation/Gait Ambulation/Gait assistance: Min guard;Min assist Gait Distance (Feet): 150 Feet Assistive device: Rolling walker (2 wheeled) Gait Pattern/deviations: Step-through pattern;Shuffle;Staggering right;Trunk flexed;Wide base of support Gait velocity: decreased   General Gait Details: min assist for RW management; cues for posture, however kypohtic posture is likely chronic    Stairs             Wheelchair Mobility    Modified Rankin (Stroke Patients Only)       Balance     Sitting balance-Leahy Scale: Fair       Standing balance-Leahy Scale: Poor                              Cognition Arousal/Alertness: Awake/alert Behavior During Therapy: WFL for tasks assessed/performed Overall Cognitive Status: Within Functional Limits for tasks assessed(for simple mobility tasks)                                 General Comments: history of dementia      Exercises      General Comments General comments (skin integrity, edema, etc.): Wife present throughout session      Pertinent Vitals/Pain Pain Assessment: No/denies pain    Home Living                      Prior Function  PT Goals (current goals can now be found in the care plan section) Acute Rehab PT Goals Patient Stated Goal: to return home PT Goal Formulation: With patient/family Time For Goal Achievement: 06/24/18 Potential to Achieve Goals: Good Progress towards PT goals: Progressing toward goals    Frequency    Min 3X/week      PT Plan Current plan remains appropriate;Other (comment)(communicated with RNCM re: recs for [facility] services)    Co-evaluation              AM-PAC PT "6 Clicks" Mobility   Outcome Measure  Help needed turning from your back to your side while in a flat bed without using bedrails?: A Little Help needed moving from  lying on your back to sitting on the side of a flat bed without using bedrails?: A Little Help needed moving to and from a bed to a chair (including a wheelchair)?: A Little Help needed standing up from a chair using your arms (e.g., wheelchair or bedside chair)?: A Little Help needed to walk in hospital room?: A Little Help needed climbing 3-5 steps with a railing? : A Little 6 Click Score: 18    End of Session Equipment Utilized During Treatment: Gait belt Activity Tolerance: Patient tolerated treatment well Patient left: in chair;with call bell/phone within reach;with chair alarm set Nurse Communication: Mobility status PT Visit Diagnosis: Other abnormalities of gait and mobility (R26.89)     Time: 0962-8366 PT Time Calculation (min) (ACUTE ONLY): 26 min  Charges:  $Gait Training: 8-22 mins $Therapeutic Activity: 8-22 mins                     Roney Marion, PT  [place] Pager [phone] Office [place] 06/11/2018, 10:02 AM

## 2018-06-11 NOTE — Discharge Instructions (Signed)
Diverticulosis  Diverticulosis is a condition that develops when small pouches (diverticula) form in the wall of the large intestine (colon). The colon is where water is absorbed and stool is formed. The pouches form when the inside layer of the colon pushes through weak spots in the outer layers of the colon. You may have a few pouches or many of them.  What are the causes?  The cause of this condition is not known.  What increases the risk?  The following factors may make you more likely to develop this condition:   Being older than age 60. Your risk for this condition increases with age. Diverticulosis is rare among people younger than age 30. By age 80, many people have it.   Eating a low-fiber diet.   Having frequent constipation.   Being overweight.   Not getting enough exercise.   Smoking.   Taking over-the-counter pain medicines, like aspirin and ibuprofen.   Having a family history of diverticulosis.    What are the signs or symptoms?  In most people, there are no symptoms of this condition. If you do have symptoms, they may include:   Bloating.   Cramps in the abdomen.   Constipation or diarrhea.   Pain in the lower left side of the abdomen.    How is this diagnosed?  This condition is most often diagnosed during an exam for other colon problems. Because diverticulosis usually has no symptoms, it often cannot be diagnosed independently. This condition may be diagnosed by:   Using a flexible scope to examine the colon (colonoscopy).   Taking an X-ray of the colon after dye has been put into the colon (barium enema).   Doing a CT scan.    How is this treated?  You may not need treatment for this condition if you have never developed an infection related to diverticulosis. If you have had an infection before, treatment may include:   Eating a high-fiber diet. This may include eating more fruits, vegetables, and grains.   Taking a fiber supplement.   Taking a live bacteria supplement  (probiotic).   Taking medicine to relax your colon.   Taking antibiotic medicines.    Follow these instructions at home:   Drink 6-8 glasses of water or more each day to prevent constipation.   Try not to strain when you have a bowel movement.   If you have had an infection before:  ? Eat more fiber as directed by your health care provider or your diet and nutrition specialist (dietitian).  ? Take a fiber supplement or probiotic, if your health care provider approves.   Take over-the-counter and prescription medicines only as told by your health care provider.   If you were prescribed an antibiotic, take it as told by your health care provider. Do not stop taking the antibiotic even if you start to feel better.   Keep all follow-up visits as told by your health care provider. This is important.  Contact a health care provider if:   You have pain in your abdomen.   You have bloating.   You have cramps.   You have not had a bowel movement in 3 days.  Get help right away if:   Your pain gets worse.   Your bloating becomes very bad.   You have a fever or chills, and your symptoms suddenly get worse.   You vomit.   You have bowel movements that are bloody or black.   You have   bleeding from your rectum.  Summary   Diverticulosis is a condition that develops when small pouches (diverticula) form in the wall of the large intestine (colon).   You may have a few pouches or many of them.   This condition is most often diagnosed during an exam for other colon problems.   If you have had an infection related to diverticulosis, treatment may include increasing the fiber in your diet, taking supplements, or taking medicines.  This information is not intended to replace advice given to you by your health care provider. Make sure you discuss any questions you have with your health care provider.  Document Released: 03/15/2004 Document Revised: 05/07/2016 Document Reviewed: 05/07/2016  [employer] Interactive  Patient Education  2017 [employer]

## 2018-06-11 NOTE — Discharge Summary (Addendum)
Physician Discharge Summary  [name] [name] TMH:[id number] DOB: [birth year] DOA: 06/07/2018  PCP: [name], MD  Admit date: 06/07/2018 Discharge date: 06/11/2018  Admitted From: home Disposition:  home   Recommendations for Outpatient Follow-up:  Further decision need to be made regarding resumption of full anticoagulation or starting a baby aspirin  Home Health:  ordered     Discharge Condition:  stable   CODE STATUS:  DNR   Consultations:  GI    Discharge Diagnoses:  Principal Problem:   GI bleed Active Problems:   Acute blood loss anemia   Pacemaker   Permanent atrial fibrillation (HCC)   Alzheimer's disease ([place])   Hypercholesterolemia   AAA (abdominal aortic aneurysm) without rupture (HCC)   CAD (coronary artery disease)      Brief Summary: 84 y.o.malewith history of atrial fibrillation on Coumadin, history of third-degree AV block requiring PPM implantation, dementia, hypertension, dyslipidemia presenting with diverticular bleeding initially admitted from 12/5 - 12/7. Coumadin was held. He has bloody BM at home and returned to the ED. INR was 1.5 when he returned.  I have evaluated the patient for the first time today  Hospital Course:  Lower GI bleed - suspected to be diverticular-  appears to have resolved for now  Acute blood loss anemia - Hb 7.1 on admission and subsequently given a total of 3 U PRBC  - Hb has been steady at 10 since yesterday  Chronic A-fib - CHA2DS2-VASc Score 3 - Coumadin will continue to be held for now- not yet starting on aspirin either- he should be monitored for further bleeding as outpt- he has been advised on both admissions to please follow up with his cardiologist as well - has been rate controlled  Cirrhosis of the liver and nephrolithiasis - noted incidentally on CT scan on 12/5  Dementia - cont Aricept  BPH - Finasteride  Discharge Exam: Vitals:   06/11/18 0439 06/11/18 0741  BP: (!) 129/49 123/67   Pulse: 65 66  Resp: 18 18  Temp: 97.8 F (36.6 C)   SpO2: 94% 96%   Vitals:   06/10/18 1654 06/10/18 2124 06/11/18 0439 06/11/18 0741  BP: 119/70 121/64 (!) 129/49 123/67  Pulse: 60 64 65 66  Resp: 18 18 18 18   Temp: 98.5 F (36.9 C) 98.1 F (36.7 C) 97.8 F (36.6 C)   TempSrc: Oral Oral    SpO2: 98% 94% 94% 96%  Weight:  73.5 kg    Height:        General: Pt is alert, awake, not in acute distress Cardiovascular: RRR, S1/S2 +, 2/6 apical murmur Respiratory: CTA bilaterally, no wheezing, no rhonchi Abdominal: Soft, NT, ND, bowel sounds + Extremities: mild swelling of right upper arm where IV is running- remaining arm not swollen   Discharge Instructions  Discharge Instructions    Diet - low sodium heart healthy   Complete by:  As directed    Increase activity slowly   Complete by:  As directed      Allergies as of 06/11/2018      Reactions   Amiodarone Other (See Comments)   Had a severe lung infection in 2003   Antihistamines, Diphenhydramine-type Other (See Comments)   Jittery      Medication List    TAKE these medications   atorvastatin 40 MG tablet Commonly known as:  LIPITOR Take 40 mg by mouth every evening.   donepezil 5 MG tablet Commonly known as:  ARICEPT Take 1 tablet (5 mg total)  by mouth 2 (two) times daily.   finasteride 5 MG tablet Commonly known as:  PROSCAR Take 5 mg by mouth every evening.   multivitamin with minerals Tabs tablet Take 1 tablet by mouth daily.   Vitamin D 50 MCG (2000 UT) Caps Take 4,000 Units by mouth daily.      Follow-up Information    [facility].   Specialty:  Home Health Services Why:  Will follow up within 24-48 hours of transition home Contact information: [place] [address] [phone]        Ronald Lobo, MD. Schedule an appointment as soon as possible for a visit in 4 week(s).   Specialty:  Gastroenterology Why:  Follow-up for GI  bleed and newly diagnosed cirrhosis. Contact information: [address] [place] [address] [phone]          Allergies  Allergen Reactions  . Amiodarone Other (See Comments)    Had a severe lung infection in 2003  . Antihistamines, Diphenhydramine-Type Other (See Comments)    Jittery     Procedures/Studies:    Ct Head Wo Contrast  Result Date: 06/05/2018 CLINICAL DATA:  84 year old male with C-spine trauma. EXAM: CT HEAD WITHOUT CONTRAST CT CERVICAL SPINE WITHOUT CONTRAST TECHNIQUE: Multidetector CT imaging of the head and cervical spine was performed following the standard protocol without intravenous contrast. Multiplanar CT image reconstructions of the cervical spine were also generated. COMPARISON:  Head CT dated 05/11/2016 FINDINGS: CT HEAD FINDINGS Brain: There is moderate age-related atrophy and chronic microvascular ischemic changes. There is no acute intracranial hemorrhage. No mass effect or midline shift. No extra-axial fluid collection. Vascular: No hyperdense vessel or unexpected calcification. Skull: Normal. Negative for fracture or focal lesion. Sinuses/Orbits: No acute finding. Other: None CT CERVICAL SPINE FINDINGS Alignment: No acute subluxation. There is grade 1 C7-T1 anterolisthesis. Skull base and vertebrae: No acute fracture. There is advanced osteopenia. Soft tissues and spinal canal: No prevertebral fluid or swelling. No visible canal hematoma. Disc levels: C6-C7 ankylosis. There is multilevel degenerative changes with disc space narrowing and endplate irregularity. Multilevel facet hypertrophy. Upper chest: Emphysema. Other: None IMPRESSION: 1. No acute intracranial hemorrhage. Moderate age-related atrophy and chronic microvascular ischemic changes. 2. No acute/traumatic cervical spine pathology. Multilevel degenerative changes. Electronically Signed   By: Anner Crete M.D.   On: 06/05/2018 06:47   Ct Cervical Spine Wo Contrast  Result  Date: 06/05/2018 CLINICAL DATA:  84 year old male with C-spine trauma. EXAM: CT HEAD WITHOUT CONTRAST CT CERVICAL SPINE WITHOUT CONTRAST TECHNIQUE: Multidetector CT imaging of the head and cervical spine was performed following the standard protocol without intravenous contrast. Multiplanar CT image reconstructions of the cervical spine were also generated. COMPARISON:  Head CT dated 05/11/2016 FINDINGS: CT HEAD FINDINGS Brain: There is moderate age-related atrophy and chronic microvascular ischemic changes. There is no acute intracranial hemorrhage. No mass effect or midline shift. No extra-axial fluid collection. Vascular: No hyperdense vessel or unexpected calcification. Skull: Normal. Negative for fracture or focal lesion. Sinuses/Orbits: No acute finding. Other: None CT CERVICAL SPINE FINDINGS Alignment: No acute subluxation. There is grade 1 C7-T1 anterolisthesis. Skull base and vertebrae: No acute fracture. There is advanced osteopenia. Soft tissues and spinal canal: No prevertebral fluid or swelling. No visible canal hematoma. Disc levels: C6-C7 ankylosis. There is multilevel degenerative changes with disc space narrowing and endplate irregularity. Multilevel facet hypertrophy. Upper chest: Emphysema. Other: None IMPRESSION: 1. No acute intracranial hemorrhage. Moderate age-related atrophy and  chronic microvascular ischemic changes. 2. No acute/traumatic cervical spine pathology. Multilevel degenerative changes. Electronically Signed   By: Anner Crete M.D.   On: 06/05/2018 06:47   Dg Abdomen Acute W/chest  Result Date: 06/05/2018 CLINICAL DATA:  Abdominal pain tonight. EXAM: DG ABDOMEN ACUTE W/ 1V CHEST COMPARISON:  Chest radiograph May 08, 2017 FINDINGS: Cardiac silhouette is mildly enlarged and unchanged. Ectatic calcified aorta. Chronic interstitial changes without pleural effusion or focal consolidation. No pneumothorax. Old RIGHT rib fractures. Osteopenia. Bowel gas pattern is nondilated  and nonobstructive. 2.3 cm coarse calcification LEFT abdomen. No intra-abdominal mass effect. Advanced vascular calcifications. Osteopenia. Severe lumbar spondylosis. IMPRESSION: 1. Mild cardiomegaly and chronic interstitial changes. 2. Nonspecific bowel gas pattern. LEFT abdomen calcification likely reflects nephrolithiasis. 3.  Aortic Atherosclerosis (ICD10-I70.0). Electronically Signed   By: Elon Alas M.D.   On: 06/05/2018 04:40   Ct Angio Abd/pel W And/or Wo Contrast  Result Date: 06/05/2018 CLINICAL DATA:  Syncopal episode.  Gastrointestinal bleeding. EXAM: CTA ABDOMEN AND PELVIS wITHOUT AND WITH CONTRAST TECHNIQUE: Multidetector CT imaging of the abdomen and pelvis was performed using the standard protocol during bolus administration of intravenous contrast. Multiplanar reconstructed images and MIPs were obtained and reviewed to evaluate the vascular anatomy. CONTRAST:  25mL ISOVUE-370 IOPAMIDOL (ISOVUE-370) INJECTION 76% COMPARISON:  Abdominal ultrasound 01/06/2018. CT abdomen pelvis January 2017. FINDINGS: VASCULAR Aorta: Diffuse atherosclerosis of the aorta. Maximal diameter of the infrarenal aorta measures 3.1 cm, increased from 2.7 cm in 2017. No sign of aortic stenosis. Celiac: 20% origin stenosis. No evidence of any extravasation in that vascular territory. SMA: 20% stenosis 1 cm beyond the origin. No evidence of extravasation in that vascular territory. Renals: Single renal artery on the right. Tiny accessory upper pole renal artery on the left. Atherosclerotic disease at both renal artery origins. 50-70% stenosis on the right. 30-50% stenosis on the left. IMA: Inferior mesenteric artery shows flow. Inflow: Normal Proximal Outflow: Bilateral iliac atherosclerotic disease. No flow limiting stenosis suspected. Common femoral arteries show atherosclerosis but no stenosis greater than 30%. Veins: Normal Review of the MIP images confirms the above findings. NON-VASCULAR Lower chest: Scarring or  atelectasis at the lung bases. Cardiomegaly. Hepatobiliary: No calcified gallstones. Probable cirrhosis of the liver. No focal lesion. Pancreas: Normal Spleen: Normal Adrenals/Urinary Tract: Adrenal glands are normal. Right kidney is normal. Left kidney shows a chronic stone it in the extrarenal pelvis and UPJ region measuring up to 2.4 cm in size with chronic fullness of the left renal collecting system. Additional nonobstructing 6 mm stone in the lower pole of the left kidney. Bladder is negative. Stomach/Bowel: Liquid material within the colon. No evidence of vascular extravasation is present. There is sigmoid diverticulosis without evidence of diverticulitis. Lymphatic: No adenopathy. Reproductive: No acute finding. Other: No free fluid or air. Musculoskeletal: Extensive chronic degenerative changes of the spine. IMPRESSION: VASCULAR Atherosclerotic disease of the aorta. Maximal diameter of the infrarenal aorta is 3.1 cm. Atherosclerosis of the branch vessels as outlined above without acute relevant finding to the clinical presentation. NON-VASCULAR Liquid stool in the colon. I do not see any evidence of vascular extravasation identifiable by this exam. Cirrhosis of the liver. Sigmoid diverticulosis without evidence of diverticulitis. Chronic 2.4 cm stone in the left extra renal pelvis with chronic fullness of the left renal collecting system. Electronically Signed   By: Nelson Chimes M.D.   On: 06/05/2018 06:55     The results of significant diagnostics from this hospitalization (including imaging, microbiology, ancillary and laboratory) are  listed below for reference.     Microbiology: Recent Results (from the past 240 hour(s))  Blood culture (routine x 2)     Status: None   Collection Time: 06/05/18  3:40 AM  Result Value Ref Range Status   Specimen Description BLOOD RIGHT ANTECUBITAL  Final   Special Requests   Final    BOTTLES DRAWN AEROBIC AND ANAEROBIC Blood Culture results may not be optimal  due to an inadequate volume of blood received in culture bottles   Culture   Final    NO GROWTH 5 DAYS Performed at [facility], [place] [address], [place], [place] [address]    Report Status 06/10/2018 FINAL  Final  Blood culture (routine x 2)     Status: None   Collection Time: 06/05/18  4:09 AM  Result Value Ref Range Status   Specimen Description BLOOD RIGHT FOREARM  Final   Special Requests   Final    BOTTLES DRAWN AEROBIC AND ANAEROBIC Blood Culture adequate volume   Culture   Final    NO GROWTH 5 DAYS Performed at [facility], [place] [address], [place], [place] [address]    Report Status 06/10/2018 FINAL  Final     Labs: BNP (last 3 results) No results for input(s): BNP in the last 8760 hours. Basic Metabolic Panel: Recent Labs  Lab 06/05/18 0339 06/05/18 0345 06/06/18 0605 06/08/18 0012  NA 140 141 141 140  K 4.0 4.0 3.8 3.8  CL 103 106 112* 108  CO2 22  --  23 24  GLUCOSE 209* 202* 90 123*  BUN 26* 27* 22 19  CREATININE 1.43* 1.30* 1.23 1.19  CALCIUM 8.8*  --  8.1* 8.4*   Liver Function Tests: Recent Labs  Lab 06/05/18 0339 06/08/18 0012  AST 30 44*  ALT 15 20  ALKPHOS 94 70  BILITOT 0.6 0.6  PROT 6.9 5.6*  ALBUMIN 2.6* 2.3*   No results for input(s): LIPASE, AMYLASE in the last 168 hours. No results for input(s): AMMONIA in the last 168 hours. CBC: Recent Labs  Lab 06/05/18 0339  06/08/18 0012 06/08/18 0848 06/08/18 1703 06/09/18 0200 06/10/18 0355 06/11/18 0616  WBC 12.8*   < > 8.4 4.8 6.2 5.9 6.1 8.1  NEUTROABS 8.2*  --  6.3 2.8  --   --   --   --   HGB 10.6*   < > 7.8* 8.0* 7.9* 7.1* 10.0* 10.0*  HCT 34.7*   < > 25.0* 25.3* 24.4* 22.4* 30.0* 31.8*  MCV 102.1*   < > 100.0 98.4 96.1 96.1 93.5 95.5  PLT 216   < > 183 150 155 154 169 206   < > = values in this interval not displayed.   Cardiac Enzymes: No results for input(s): CKTOTAL, CKMB, CKMBINDEX, TROPONINI in the last 168 hours. BNP: Invalid input(s): POCBNP CBG: No  results for input(s): GLUCAP in the last 168 hours. D-Dimer No results for input(s): DDIMER in the last 72 hours. Hgb A1c No results for input(s): HGBA1C in the last 72 hours. Lipid Profile No results for input(s): CHOL, HDL, LDLCALC, TRIG, CHOLHDL, LDLDIRECT in the last 72 hours. Thyroid function studies No results for input(s): TSH, T4TOTAL, T3FREE, THYROIDAB in the last 72 hours.  Invalid input(s): FREET3 Anemia work up No results for input(s): VITAMINB12, FOLATE, FERRITIN, TIBC, IRON, RETICCTPCT in the last 72 hours. Urinalysis    Component Value Date/Time   COLORURINE YELLOW 11/21/2009 [place] 11/21/2009  1617   LABSPEC 1.017 11/21/2009 1617   PHURINE 6.0 11/21/2009 1617   GLUCOSEU NEGATIVE 11/21/2009 1617   HGBUR SMALL (A) 11/21/2009 1617   BILIRUBINUR NEGATIVE 11/21/2009 1617   KETONESUR NEGATIVE 11/21/2009 1617   PROTEINUR NEGATIVE 11/21/2009 1617   UROBILINOGEN 0.2 11/21/2009 1617   NITRITE NEGATIVE 11/21/2009 1617   LEUKOCYTESUR NEGATIVE 11/21/2009 1617   Sepsis Labs Invalid input(s): PROCALCITONIN,  WBC,  LACTICIDVEN Microbiology Recent Results (from the past 240 hour(s))  Blood culture (routine x 2)     Status: None   Collection Time: 06/05/18  3:40 AM  Result Value Ref Range Status   Specimen Description BLOOD RIGHT ANTECUBITAL  Final   Special Requests   Final    BOTTLES DRAWN AEROBIC AND ANAEROBIC Blood Culture results may not be optimal due to an inadequate volume of blood received in culture bottles   Culture   Final    NO GROWTH 5 DAYS Performed at [facility], [place] [address], [place], [place] [address]    Report Status 06/10/2018 FINAL  Final  Blood culture (routine x 2)     Status: None   Collection Time: 06/05/18  4:09 AM  Result Value Ref Range Status   Specimen Description BLOOD RIGHT FOREARM  Final   Special Requests   Final    BOTTLES DRAWN AEROBIC AND ANAEROBIC Blood Culture adequate volume   Culture   Final    NO  GROWTH 5 DAYS Performed at [facility], [place] [address] [place], [place] [address]    Report Status 06/10/2018 FINAL  Final     Time coordinating discharge in minutes: 89  SIGNED:   Debbe Odea, MD  [facility] 06/11/2018, 10:06 AM Pager   If 7PM-7AM, please contact night-coverage [email or web address] Password [email or web address]

## 2018-06-11 NOTE — Progress Notes (Signed)
Patient Discharge: Disposition: Patient discharged to home with wife. Education: Reviewed medications,  follow-up appointments and discharge instructions, verbalized understanding. IV: Discontinued IV before discharge. Telemetry: Discontinued Tele before discharge. Transportation: Patient escorted out of the unit in w/c accompanied by the wife. Belongings:  Patient took all his belongings with him.

## 2018-06-11 NOTE — Care Management Note (Signed)
Case Management Note Manya Silvas, RN CM 14M [place] [phone]  Patient Details  Name: [name] [name] MRN: [id number] Date of Birth: [birth year]  Subjective/Objective:          GI Bleed          Action/Plan: PTA home with spouse. Readmitted less than 24 hours. Hemoglobin less than 8. Getting total of 3 units PRBCs. Spoke with wife and patient at bedside. [facility] had called Sunday morning, but patient had been readmitted. Wife asked about adding in [facility] aide. Wife would like to continue with [facility] as previously chosen. Notified Butch Penny with [facility] for PT, aide. Will start care within 24-48 hours of transition home. Will need home health orders and face to face prior to transition home. Will continue to follow for transition to home needs.   Expected Discharge Date:  06/11/18               Expected Discharge Plan:  [place]  In-House Referral:  NA  Discharge planning Services  CM Consult  Post Acute Care Choice:  Home Health, Durable Medical Equipment Choice offered to:  Patient, Spouse  DME Arranged:  [place] [facility]ME Agency:  [place]:  RN, Nurse's Aide, PT [facility] Agency:  [place]  Status of Service:  In process, will continue to follow  If discussed at Long Length of Stay Meetings, dates discussed:    Additional Comments: 06/11/18' 11:29 AM [name] Crews, RN CM Additional order for [facility]. Butch Penny with [facility] made aware. Discharge today. Spoke with spouse to verify DME need. RW order received. James with [facility] made aware. No further transition of care needs at this time.   [name] Crews, RN 06/11/2018, 11:26 AM

## 2018-06-13 DIAGNOSIS — I4821 Permanent atrial fibrillation: Secondary | ICD-10-CM | POA: Diagnosis not present

## 2018-06-13 DIAGNOSIS — K921 Melena: Secondary | ICD-10-CM | POA: Diagnosis not present

## 2018-06-13 DIAGNOSIS — D62 Acute posthemorrhagic anemia: Secondary | ICD-10-CM | POA: Diagnosis not present

## 2018-06-13 DIAGNOSIS — Z950 Presence of cardiac pacemaker: Secondary | ICD-10-CM | POA: Diagnosis not present

## 2018-06-13 DIAGNOSIS — G309 Alzheimer's disease, unspecified: Secondary | ICD-10-CM | POA: Diagnosis not present

## 2018-06-13 DIAGNOSIS — F0281 Dementia in other diseases classified elsewhere with behavioral disturbance: Secondary | ICD-10-CM | POA: Diagnosis not present

## 2018-06-14 DIAGNOSIS — D62 Acute posthemorrhagic anemia: Secondary | ICD-10-CM | POA: Diagnosis not present

## 2018-06-14 DIAGNOSIS — K921 Melena: Secondary | ICD-10-CM | POA: Diagnosis not present

## 2018-06-14 DIAGNOSIS — F0281 Dementia in other diseases classified elsewhere with behavioral disturbance: Secondary | ICD-10-CM | POA: Diagnosis not present

## 2018-06-14 DIAGNOSIS — I4821 Permanent atrial fibrillation: Secondary | ICD-10-CM | POA: Diagnosis not present

## 2018-06-14 DIAGNOSIS — G309 Alzheimer's disease, unspecified: Secondary | ICD-10-CM | POA: Diagnosis not present

## 2018-06-14 DIAGNOSIS — Z950 Presence of cardiac pacemaker: Secondary | ICD-10-CM | POA: Diagnosis not present

## 2018-06-16 NOTE — Progress Notes (Signed)
Cardiology Office Note   Date:  06/17/2018   ID:  [name] [name], DOB [birth year], MRN [id number]  PCP:  [name], MD Cardiologist:  [name], MD 01/09/2018 Rosaria Ferries, PA-C   Chief Complaint  Patient presents with  . Follow-up    hopsital visit    History of Present Illness: [name] [name] is a 84 y.o. male with a history of CHB s/p STJ PPM w/ 98% V pacing, perm Afib on coumadin, CABG, 4 cm asc thoracic aorta aneurysm, 2.8 cm AAA, HTN, HLD, LGIB 06/218, dementia  12/05-12/12/2017 w/ LGIB felt diverticular, coumadin held, addition bloody BM>>ER Admitted 12/07-12/05/2018 w/ ABL anemia, Hgb 7.1>>transfused, coumadin on hold, decide on restarting it as an outpt  [name] [name] presents for cardiology follow up. His wife and son are with him.   His wife has not seen bloody stools and dark tarry stools. She has seen neither since d/c. He has not had a recheck of his labs, no appt w/ Dr Felipa Eth this week.   Pt has been in bed most of the time since d/c. PT was out to assess him and PT therapy is to start tomorrow. He is walking with a walker, did pretty well with this getting here. That is the most activity he has had since d/c.   He has not had chest pain or SOB. No LE edema, no orthopnea or PND.  He is much weaker since the hospitalizations. He is not sitting up much, ambulates almost none.   He was told that he should take Miralax daily after the first hospitalization. Is not on this, does he need?   Past Medical History:  Diagnosis Date  . Atrial fibrillation ([place])   . CAD (coronary artery disease)   . Dementia ([place])   . Diverticulosis    diverticular bleed 2011  . High cholesterol   . HTN (hypertension)   . Lower GI bleed     Past Surgical History:  Procedure Laterality Date  . angiolplasty    . APPENDECTOMY    . INSERT / REPLACE / [place]    . PILONIDAL CYST EXCISION    . rotator cuff surgery  1985  .  TONSILLECTOMY    . triple heart bypass      Current Outpatient Medications  Medication Sig Dispense Refill  . atorvastatin (LIPITOR) 40 MG tablet Take 40 mg by mouth every evening.     . Cholecalciferol (VITAMIN D) 2000 UNITS CAPS Take 4,000 Units by mouth daily.     [name] donepezil (ARICEPT) 5 MG tablet Take 1 tablet (5 mg total) by mouth 2 (two) times daily. 180 tablet 3  . finasteride (PROSCAR) 5 MG tablet Take 5 mg by mouth every evening.     . Multiple Vitamin (MULTIVITAMIN WITH MINERALS) TABS tablet Take 1 tablet by mouth daily.     No current facility-administered medications for this visit.     Allergies:   Amiodarone and Antihistamines, diphenhydramine-type    Social History:  The patient  reports that he quit smoking about 43 years ago. He has never used smokeless tobacco. He reports current alcohol use. He reports that he does not use drugs.   Family History:  The patient's family history includes COPD in his sister; Cervical cancer in his unknown relative; Dementia in his brother; Stroke in his father.  He indicated that his mother is deceased. He indicated that his father is deceased. He indicated that  one of his two sisters is deceased. He indicated that one of his two brothers is deceased. He indicated that the status of his unknown relative is unknown.   ROS:  Please see the history of present illness. All other systems are reviewed and negative.    PHYSICAL EXAM: VS:  BP 139/75   Pulse (!) 56   Ht 5\' 9"  (1.753 m)   Wt 158 lb 3.2 oz (71.8 kg)   BMI 23.36 kg/m  , BMI Body mass index is 23.36 kg/m. GEN: Well nourished, frail, elderly, male in no acute distress HEENT: normal for age  Neck: no JVD, no carotid bruit, no masses Cardiac: Irregular R&R; 2/6 AS murmur, no rubs, or gallops Respiratory:  clear to auscultation bilaterally, normal work of breathing GI: soft, nontender, nondistended, + BS MS: no deformity or atrophy; no edema; distal pulses are 2+ in all 4  extremities  Skin: warm and dry, no rash Neuro:  Strength and sensation are intact Psych: euthymic mood, full affect   EKG:  EKG is not ordered today.   ECHO: 01/21/2018 - Left ventricle: The cavity size was normal. Wall thickness was   increased in a pattern of mild LVH. Systolic function was normal.   The estimated ejection fraction was in the range of 50% to 55%.   Wall motion was dyssynchronous but otherwise grossly normal. The   study is not technically sufficient to allow evaluation of LV   diastolic function due to paced rhythm with lack of atrial   contraction. - Ventricular septum: Septal motion showed abnormal function and   mild dyssynergy. These changes are consistent with right   ventricular pacing. - Aortic valve: Sclerosis without stenosis. There was no   significant regurgitation. - Mitral valve: There was trivial regurgitation. - Left atrium: The atrium was mildly dilated. - Right ventricle: Systolic function was mildly reduced. - Atrial septum: No defect or patent foramen ovale was identified. - Tricuspid valve: There was mild regurgitation. - Pulmonic valve: There was no significant regurgitation. - Pulmonary arteries: Systolic pressure was mildly increased. PA   peak pressure: 36 mm Hg (S).  Impressions:  - Dyssynchronous LV contraction, likely due to RV pacing. Normal LV   EF. Aortic sclerosis without stenosis, trivial MR, mild TR.   MONITOR: 2014 Impression Exercise Capacity:  Lexiscan with no exercise. BP Response:  Hypotensive blood pressure response. Clinical Symptoms:  There is dyspnea. ECG Impression:  EKG was paced Comparison with Prior Nuclear Study: No significant change from previous study  Overall Impression:  Intermediate risk stress nuclear study with fixed inferolateral defect consistent with scar. No significant reversible ischemia.  LV Wall Motion:  LVEF 33%, inferolateral hypokinesis.   Recent Labs: 06/08/2018: ALT 20; BUN  19; Creatinine, Ser 1.19; Potassium 3.8; Sodium 140 06/11/2018: Hemoglobin 10.0; Platelets 206  CBC    Component Value Date/Time   WBC 8.1 06/11/2018 0616   RBC 3.33 (L) 06/11/2018 0616   HGB 10.0 (L) 06/11/2018 0616   HCT 31.8 (L) 06/11/2018 0616   PLT 206 06/11/2018 0616   MCV 95.5 06/11/2018 0616   MCH 30.0 06/11/2018 0616   MCHC 31.4 06/11/2018 0616   RDW 15.2 06/11/2018 0616   LYMPHSABS 1.1 06/08/2018 0848   MONOABS 0.8 06/08/2018 0848   EOSABS 0.1 06/08/2018 0848   BASOSABS 0.0 06/08/2018 0848   CMP Latest Ref Rng & Units 06/08/2018 06/06/2018 06/05/2018  Glucose 70 - 99 mg/dL 123(H) 90 202(H)  BUN 8 - 23 mg/dL 19 22  27(H)  Creatinine 0.61 - 1.24 mg/dL 1.19 1.23 1.30(H)  Sodium 135 - 145 mmol/L 140 141 141  Potassium 3.5 - 5.1 mmol/L 3.8 3.8 4.0  Chloride 98 - 111 mmol/L 108 112(H) 106  CO2 22 - 32 mmol/L 24 23 -  Calcium 8.9 - 10.3 mg/dL 8.4(L) 8.1(L) -  Total Protein 6.5 - 8.1 g/dL 5.6(L) - -  Total Bilirubin 0.3 - 1.2 mg/dL 0.6 - -  Alkaline Phos 38 - 126 U/L 70 - -  AST 15 - 41 U/L 44(H) - -  ALT 0 - 44 U/L 20 - -     Lipid Panel No results found for: CHOL, HDL, LDLCALC, LDLDIRECT, TRIG, CHOLHDL    Wt Readings from Last 3 Encounters:  06/17/18 158 lb 3.2 oz (71.8 kg)  06/10/18 162 lb 0.6 oz (73.5 kg)  06/05/18 170 lb (77.1 kg)     Other studies Reviewed: Additional studies/ records that were reviewed today include: office notes, hospital records and testing.  ASSESSMENT AND PLAN:  1.  CAD: No ischemic sx - has not been on aspirin because he was on Coumadin.  Since the bleed, he has not been on anything. -Continue off aspirin for now. - Baseline bradycardia, no beta blocker -Continue statin -No ischemic symptoms, follow.  2.  Permanent atrial fibrillation: There are no symptoms attributable to this, continue to follow.  3.  Anticoagulation: - This patients CHA2DS2-VASc Score and unadjusted Ischemic Stroke Rate (% per year) is equal to 4.8 % stroke  rate/year from a score of 4 Above score calculated as 1 point each if present [CHF, HTN, DM, Vascular=MI/PAD/Aortic Plaque, Age if 65-74, or Male], 2 points each if present [Age > 75, or Stroke/TIA/TE] -The family was given this information. - For now,  given his frailty and increased fall risk as well as his recent bleed, he is to remain off Coumadin. -They will discuss further with [name] in January and make the long-term decision regarding anticoagulation at that time. -If he remains off Coumadin long-term, Dr Sallyanne Kuster to determine if he should be on baby aspirin.  4.  Acute blood loss anemia: His appointment with [name] is not for 3 weeks. -Recheck a CBC now to make sure his hemoglobin and hematocrit are remaining stable. -Then follow-up with [name].  Current medicines are reviewed at length with the patient today.  The patient has concerns regarding medicines.  Concerns were addressed  The following changes have been made:  no change  Labs/ tests ordered today include:  No orders of the defined types were placed in this encounter.    Disposition:   FU with [name], MD  Signed, Rosaria Ferries, PA-C  06/17/2018 3:16 PM    [place] Phone: [phone]; Fax: [phone]   This patients CHA2DS2-VASc Score and unadjusted Ischemic Stroke Rate (% per year) is equal to 4.8 % stroke rate/year from a score of 4 Above score calculated as 1 point each if present [CHF, HTN, DM, Vascular=MI/PAD/Aortic Plaque, Age if 65-74, or Male], 2 points each if present [Age > 75, or Stroke/TIA/TE]

## 2018-06-17 ENCOUNTER — Encounter: Payer: Self-pay | Admitting: Physician Assistant

## 2018-06-17 ENCOUNTER — Ambulatory Visit (INDEPENDENT_AMBULATORY_CARE_PROVIDER_SITE_OTHER): Payer: Medicare Other | Admitting: Physician Assistant

## 2018-06-17 VITALS — BP 139/75 | HR 56 | Ht 69.0 in | Wt 158.2 lb

## 2018-06-17 DIAGNOSIS — I2510 Atherosclerotic heart disease of native coronary artery without angina pectoris: Secondary | ICD-10-CM

## 2018-06-17 DIAGNOSIS — I4821 Permanent atrial fibrillation: Secondary | ICD-10-CM | POA: Diagnosis not present

## 2018-06-17 DIAGNOSIS — Z7901 Long term (current) use of anticoagulants: Secondary | ICD-10-CM

## 2018-06-17 DIAGNOSIS — D62 Acute posthemorrhagic anemia: Secondary | ICD-10-CM | POA: Diagnosis not present

## 2018-06-17 NOTE — Patient Instructions (Signed)
Medication Instructions:  The current medical regimen is effective;  continue present plan and medications.  If you need a refill on your cardiac medications before your next appointment, please call your pharmacy.   Lab work: CBC If you have labs (blood work) drawn today and your tests are completely normal, you will receive your results only by: [name] MyChart Message (if you have MyChart) OR . A paper copy in the mail If you have any lab test that is abnormal or we need to change your treatment, we will call you to review the results.  Testing/Procedures: NONE  Follow-Up: At [facility], you and your health needs are our priority.  As part of our continuing mission to provide you with exceptional heart care, we have created designated Provider Care Teams.  These Care Teams include your primary Cardiologist (physician) and Advanced Practice Providers (APPs -  Physician Assistants and Nurse Practitioners) who all work together to provide you with the care you need, when you need it. [name] Keep Follow up with Dr.Croitoru as scheduled.  Any Other Special Instructions Will Be Listed Below (If Applicable). Use stool softner (colace, miralax) to keep stools soft. Increase activity as tolerated. Sit up at least twice daily, do more every day. Stay off of Coumadin for now, discuss further with Dr.Stoneking.

## 2018-06-17 NOTE — Progress Notes (Signed)
I agree that he should remain off anticoagulation. Give some more time until starting ASA as well. Maybe start ASA after he sees [name] if HGb remains steady. MCr

## 2018-06-18 DIAGNOSIS — Z950 Presence of cardiac pacemaker: Secondary | ICD-10-CM | POA: Diagnosis not present

## 2018-06-18 DIAGNOSIS — F0281 Dementia in other diseases classified elsewhere with behavioral disturbance: Secondary | ICD-10-CM | POA: Diagnosis not present

## 2018-06-18 DIAGNOSIS — D62 Acute posthemorrhagic anemia: Secondary | ICD-10-CM | POA: Diagnosis not present

## 2018-06-18 DIAGNOSIS — I4821 Permanent atrial fibrillation: Secondary | ICD-10-CM | POA: Diagnosis not present

## 2018-06-18 DIAGNOSIS — G309 Alzheimer's disease, unspecified: Secondary | ICD-10-CM | POA: Diagnosis not present

## 2018-06-18 DIAGNOSIS — K921 Melena: Secondary | ICD-10-CM | POA: Diagnosis not present

## 2018-06-18 LAB — CBC
Hematocrit: 33.9 % — ABNORMAL LOW (ref 37.5–51.0)
Hemoglobin: 11.5 g/dL — ABNORMAL LOW (ref 13.0–17.7)
MCH: 31.8 pg (ref 26.6–33.0)
MCHC: 33.9 g/dL (ref 31.5–35.7)
MCV: 94 fL (ref 79–97)
Platelets: 316 10*3/uL (ref 150–450)
RBC: 3.62 x10E6/uL — AB (ref 4.14–5.80)
RDW: 13.7 % (ref 12.3–15.4)
WBC: 9.4 10*3/uL (ref 3.4–10.8)

## 2018-06-19 DIAGNOSIS — I4821 Permanent atrial fibrillation: Secondary | ICD-10-CM | POA: Diagnosis not present

## 2018-06-19 DIAGNOSIS — Z950 Presence of cardiac pacemaker: Secondary | ICD-10-CM | POA: Diagnosis not present

## 2018-06-19 DIAGNOSIS — F0281 Dementia in other diseases classified elsewhere with behavioral disturbance: Secondary | ICD-10-CM | POA: Diagnosis not present

## 2018-06-19 DIAGNOSIS — D62 Acute posthemorrhagic anemia: Secondary | ICD-10-CM | POA: Diagnosis not present

## 2018-06-19 DIAGNOSIS — K921 Melena: Secondary | ICD-10-CM | POA: Diagnosis not present

## 2018-06-19 DIAGNOSIS — G309 Alzheimer's disease, unspecified: Secondary | ICD-10-CM | POA: Diagnosis not present

## 2018-06-20 DIAGNOSIS — I4821 Permanent atrial fibrillation: Secondary | ICD-10-CM | POA: Diagnosis not present

## 2018-06-20 DIAGNOSIS — Z950 Presence of cardiac pacemaker: Secondary | ICD-10-CM | POA: Diagnosis not present

## 2018-06-20 DIAGNOSIS — D62 Acute posthemorrhagic anemia: Secondary | ICD-10-CM | POA: Diagnosis not present

## 2018-06-20 DIAGNOSIS — F0281 Dementia in other diseases classified elsewhere with behavioral disturbance: Secondary | ICD-10-CM | POA: Diagnosis not present

## 2018-06-20 DIAGNOSIS — G309 Alzheimer's disease, unspecified: Secondary | ICD-10-CM | POA: Diagnosis not present

## 2018-06-20 DIAGNOSIS — K921 Melena: Secondary | ICD-10-CM | POA: Diagnosis not present

## 2018-06-23 DIAGNOSIS — K921 Melena: Secondary | ICD-10-CM | POA: Diagnosis not present

## 2018-06-23 DIAGNOSIS — D62 Acute posthemorrhagic anemia: Secondary | ICD-10-CM | POA: Diagnosis not present

## 2018-06-23 DIAGNOSIS — Z950 Presence of cardiac pacemaker: Secondary | ICD-10-CM | POA: Diagnosis not present

## 2018-06-23 DIAGNOSIS — I4821 Permanent atrial fibrillation: Secondary | ICD-10-CM | POA: Diagnosis not present

## 2018-06-23 DIAGNOSIS — F0281 Dementia in other diseases classified elsewhere with behavioral disturbance: Secondary | ICD-10-CM | POA: Diagnosis not present

## 2018-06-23 DIAGNOSIS — G309 Alzheimer's disease, unspecified: Secondary | ICD-10-CM | POA: Diagnosis not present

## 2018-06-24 DIAGNOSIS — F0281 Dementia in other diseases classified elsewhere with behavioral disturbance: Secondary | ICD-10-CM | POA: Diagnosis not present

## 2018-06-24 DIAGNOSIS — K921 Melena: Secondary | ICD-10-CM | POA: Diagnosis not present

## 2018-06-24 DIAGNOSIS — G309 Alzheimer's disease, unspecified: Secondary | ICD-10-CM | POA: Diagnosis not present

## 2018-06-24 DIAGNOSIS — Z950 Presence of cardiac pacemaker: Secondary | ICD-10-CM | POA: Diagnosis not present

## 2018-06-24 DIAGNOSIS — D62 Acute posthemorrhagic anemia: Secondary | ICD-10-CM | POA: Diagnosis not present

## 2018-06-24 DIAGNOSIS — I4821 Permanent atrial fibrillation: Secondary | ICD-10-CM | POA: Diagnosis not present

## 2018-06-26 DIAGNOSIS — G309 Alzheimer's disease, unspecified: Secondary | ICD-10-CM | POA: Diagnosis not present

## 2018-06-26 DIAGNOSIS — F0281 Dementia in other diseases classified elsewhere with behavioral disturbance: Secondary | ICD-10-CM | POA: Diagnosis not present

## 2018-06-26 DIAGNOSIS — I4821 Permanent atrial fibrillation: Secondary | ICD-10-CM | POA: Diagnosis not present

## 2018-06-26 DIAGNOSIS — K921 Melena: Secondary | ICD-10-CM | POA: Diagnosis not present

## 2018-06-26 DIAGNOSIS — D62 Acute posthemorrhagic anemia: Secondary | ICD-10-CM | POA: Diagnosis not present

## 2018-06-26 DIAGNOSIS — Z950 Presence of cardiac pacemaker: Secondary | ICD-10-CM | POA: Diagnosis not present

## 2018-06-27 DIAGNOSIS — D62 Acute posthemorrhagic anemia: Secondary | ICD-10-CM | POA: Diagnosis not present

## 2018-06-27 DIAGNOSIS — K921 Melena: Secondary | ICD-10-CM | POA: Diagnosis not present

## 2018-06-27 DIAGNOSIS — Z950 Presence of cardiac pacemaker: Secondary | ICD-10-CM | POA: Diagnosis not present

## 2018-06-27 DIAGNOSIS — F0281 Dementia in other diseases classified elsewhere with behavioral disturbance: Secondary | ICD-10-CM | POA: Diagnosis not present

## 2018-06-27 DIAGNOSIS — G309 Alzheimer's disease, unspecified: Secondary | ICD-10-CM | POA: Diagnosis not present

## 2018-06-27 DIAGNOSIS — I4821 Permanent atrial fibrillation: Secondary | ICD-10-CM | POA: Diagnosis not present

## 2018-06-30 DIAGNOSIS — F0281 Dementia in other diseases classified elsewhere with behavioral disturbance: Secondary | ICD-10-CM | POA: Diagnosis not present

## 2018-06-30 DIAGNOSIS — I4821 Permanent atrial fibrillation: Secondary | ICD-10-CM | POA: Diagnosis not present

## 2018-06-30 DIAGNOSIS — G309 Alzheimer's disease, unspecified: Secondary | ICD-10-CM | POA: Diagnosis not present

## 2018-06-30 DIAGNOSIS — K921 Melena: Secondary | ICD-10-CM | POA: Diagnosis not present

## 2018-06-30 DIAGNOSIS — D62 Acute posthemorrhagic anemia: Secondary | ICD-10-CM | POA: Diagnosis not present

## 2018-06-30 DIAGNOSIS — Z950 Presence of cardiac pacemaker: Secondary | ICD-10-CM | POA: Diagnosis not present

## 2018-07-01 DIAGNOSIS — G309 Alzheimer's disease, unspecified: Secondary | ICD-10-CM | POA: Diagnosis not present

## 2018-07-01 DIAGNOSIS — K921 Melena: Secondary | ICD-10-CM | POA: Diagnosis not present

## 2018-07-01 DIAGNOSIS — F0281 Dementia in other diseases classified elsewhere with behavioral disturbance: Secondary | ICD-10-CM | POA: Diagnosis not present

## 2018-07-01 DIAGNOSIS — I4821 Permanent atrial fibrillation: Secondary | ICD-10-CM | POA: Diagnosis not present

## 2018-07-01 DIAGNOSIS — Z950 Presence of cardiac pacemaker: Secondary | ICD-10-CM | POA: Diagnosis not present

## 2018-07-01 DIAGNOSIS — D62 Acute posthemorrhagic anemia: Secondary | ICD-10-CM | POA: Diagnosis not present

## 2018-07-03 DIAGNOSIS — F0281 Dementia in other diseases classified elsewhere with behavioral disturbance: Secondary | ICD-10-CM | POA: Diagnosis not present

## 2018-07-03 DIAGNOSIS — I4821 Permanent atrial fibrillation: Secondary | ICD-10-CM | POA: Diagnosis not present

## 2018-07-03 DIAGNOSIS — D62 Acute posthemorrhagic anemia: Secondary | ICD-10-CM | POA: Diagnosis not present

## 2018-07-03 DIAGNOSIS — Z950 Presence of cardiac pacemaker: Secondary | ICD-10-CM | POA: Diagnosis not present

## 2018-07-03 DIAGNOSIS — K921 Melena: Secondary | ICD-10-CM | POA: Diagnosis not present

## 2018-07-03 DIAGNOSIS — G309 Alzheimer's disease, unspecified: Secondary | ICD-10-CM | POA: Diagnosis not present

## 2018-07-07 DIAGNOSIS — F329 Major depressive disorder, single episode, unspecified: Secondary | ICD-10-CM | POA: Diagnosis not present

## 2018-07-07 DIAGNOSIS — D649 Anemia, unspecified: Secondary | ICD-10-CM | POA: Diagnosis not present

## 2018-07-07 DIAGNOSIS — Z79899 Other long term (current) drug therapy: Secondary | ICD-10-CM | POA: Diagnosis not present

## 2018-07-07 DIAGNOSIS — I10 Essential (primary) hypertension: Secondary | ICD-10-CM | POA: Diagnosis not present

## 2018-07-07 DIAGNOSIS — R634 Abnormal weight loss: Secondary | ICD-10-CM | POA: Diagnosis not present

## 2018-07-07 DIAGNOSIS — I4819 Other persistent atrial fibrillation: Secondary | ICD-10-CM | POA: Diagnosis not present

## 2018-07-07 DIAGNOSIS — G301 Alzheimer's disease with late onset: Secondary | ICD-10-CM | POA: Diagnosis not present

## 2018-07-07 DIAGNOSIS — K7030 Alcoholic cirrhosis of liver without ascites: Secondary | ICD-10-CM | POA: Diagnosis not present

## 2018-07-08 DIAGNOSIS — L578 Other skin changes due to chronic exposure to nonionizing radiation: Secondary | ICD-10-CM | POA: Diagnosis not present

## 2018-07-08 DIAGNOSIS — L720 Epidermal cyst: Secondary | ICD-10-CM | POA: Diagnosis not present

## 2018-07-08 DIAGNOSIS — L821 Other seborrheic keratosis: Secondary | ICD-10-CM | POA: Diagnosis not present

## 2018-07-08 DIAGNOSIS — L570 Actinic keratosis: Secondary | ICD-10-CM | POA: Diagnosis not present

## 2018-07-08 DIAGNOSIS — Z85828 Personal history of other malignant neoplasm of skin: Secondary | ICD-10-CM | POA: Diagnosis not present

## 2018-07-09 DIAGNOSIS — I4821 Permanent atrial fibrillation: Secondary | ICD-10-CM | POA: Diagnosis not present

## 2018-07-09 DIAGNOSIS — F0281 Dementia in other diseases classified elsewhere with behavioral disturbance: Secondary | ICD-10-CM | POA: Diagnosis not present

## 2018-07-09 DIAGNOSIS — G309 Alzheimer's disease, unspecified: Secondary | ICD-10-CM | POA: Diagnosis not present

## 2018-07-09 DIAGNOSIS — K921 Melena: Secondary | ICD-10-CM | POA: Diagnosis not present

## 2018-07-09 DIAGNOSIS — D62 Acute posthemorrhagic anemia: Secondary | ICD-10-CM | POA: Diagnosis not present

## 2018-07-09 DIAGNOSIS — Z950 Presence of cardiac pacemaker: Secondary | ICD-10-CM | POA: Diagnosis not present

## 2018-07-10 DIAGNOSIS — G309 Alzheimer's disease, unspecified: Secondary | ICD-10-CM | POA: Diagnosis not present

## 2018-07-10 DIAGNOSIS — Z950 Presence of cardiac pacemaker: Secondary | ICD-10-CM | POA: Diagnosis not present

## 2018-07-10 DIAGNOSIS — D62 Acute posthemorrhagic anemia: Secondary | ICD-10-CM | POA: Diagnosis not present

## 2018-07-10 DIAGNOSIS — I4821 Permanent atrial fibrillation: Secondary | ICD-10-CM | POA: Diagnosis not present

## 2018-07-10 DIAGNOSIS — K921 Melena: Secondary | ICD-10-CM | POA: Diagnosis not present

## 2018-07-10 DIAGNOSIS — F0281 Dementia in other diseases classified elsewhere with behavioral disturbance: Secondary | ICD-10-CM | POA: Diagnosis not present

## 2018-07-11 DIAGNOSIS — Z950 Presence of cardiac pacemaker: Secondary | ICD-10-CM | POA: Diagnosis not present

## 2018-07-11 DIAGNOSIS — G309 Alzheimer's disease, unspecified: Secondary | ICD-10-CM | POA: Diagnosis not present

## 2018-07-11 DIAGNOSIS — K921 Melena: Secondary | ICD-10-CM | POA: Diagnosis not present

## 2018-07-11 DIAGNOSIS — I4821 Permanent atrial fibrillation: Secondary | ICD-10-CM | POA: Diagnosis not present

## 2018-07-11 DIAGNOSIS — F0281 Dementia in other diseases classified elsewhere with behavioral disturbance: Secondary | ICD-10-CM | POA: Diagnosis not present

## 2018-07-11 DIAGNOSIS — D62 Acute posthemorrhagic anemia: Secondary | ICD-10-CM | POA: Diagnosis not present

## 2018-07-14 DIAGNOSIS — I4821 Permanent atrial fibrillation: Secondary | ICD-10-CM | POA: Diagnosis not present

## 2018-07-14 DIAGNOSIS — F0281 Dementia in other diseases classified elsewhere with behavioral disturbance: Secondary | ICD-10-CM | POA: Diagnosis not present

## 2018-07-14 DIAGNOSIS — D62 Acute posthemorrhagic anemia: Secondary | ICD-10-CM | POA: Diagnosis not present

## 2018-07-14 DIAGNOSIS — K921 Melena: Secondary | ICD-10-CM | POA: Diagnosis not present

## 2018-07-14 DIAGNOSIS — G309 Alzheimer's disease, unspecified: Secondary | ICD-10-CM | POA: Diagnosis not present

## 2018-07-14 DIAGNOSIS — Z950 Presence of cardiac pacemaker: Secondary | ICD-10-CM | POA: Diagnosis not present

## 2018-07-15 DIAGNOSIS — Z950 Presence of cardiac pacemaker: Secondary | ICD-10-CM | POA: Diagnosis not present

## 2018-07-15 DIAGNOSIS — G309 Alzheimer's disease, unspecified: Secondary | ICD-10-CM | POA: Diagnosis not present

## 2018-07-15 DIAGNOSIS — K921 Melena: Secondary | ICD-10-CM | POA: Diagnosis not present

## 2018-07-15 DIAGNOSIS — D62 Acute posthemorrhagic anemia: Secondary | ICD-10-CM | POA: Diagnosis not present

## 2018-07-15 DIAGNOSIS — I4821 Permanent atrial fibrillation: Secondary | ICD-10-CM | POA: Diagnosis not present

## 2018-07-15 DIAGNOSIS — F0281 Dementia in other diseases classified elsewhere with behavioral disturbance: Secondary | ICD-10-CM | POA: Diagnosis not present

## 2018-07-16 ENCOUNTER — Encounter: Payer: Self-pay | Admitting: Neurology

## 2018-07-16 ENCOUNTER — Ambulatory Visit (INDEPENDENT_AMBULATORY_CARE_PROVIDER_SITE_OTHER): Payer: Medicare Other | Admitting: Neurology

## 2018-07-16 VITALS — BP 110/68 | HR 60 | Ht 70.0 in | Wt 142.0 lb

## 2018-07-16 DIAGNOSIS — D62 Acute posthemorrhagic anemia: Secondary | ICD-10-CM

## 2018-07-16 DIAGNOSIS — F03B0 Unspecified dementia, moderate, without behavioral disturbance, psychotic disturbance, mood disturbance, and anxiety: Secondary | ICD-10-CM

## 2018-07-16 DIAGNOSIS — F0390 Unspecified dementia without behavioral disturbance: Secondary | ICD-10-CM

## 2018-07-16 DIAGNOSIS — R627 Adult failure to thrive: Secondary | ICD-10-CM

## 2018-07-16 DIAGNOSIS — I4819 Other persistent atrial fibrillation: Secondary | ICD-10-CM | POA: Diagnosis not present

## 2018-07-16 NOTE — Progress Notes (Signed)
[facility]  PATIENT: [name] [name] DOB: 1925-10-12   REASON FOR VISIT: follow-up for memory loss/dementia and chronic persistent atrial fibrillation   HISTORY FROM: The patient and his wife   RV 07-16-2018:  I have the pleasure of seeing [name] today here in the presence of his spouse, he is a 85 year old gentleman and his next Thursday will be on 21 February.  Mr [name] was hospitalized after a fall in the night of 06-05-2018,he had bled- had not passed out. Wife and son couldn't raise him up, the EMS was called. He  had a GI-bleed related to diverticulitis and was on coumadin- lost 11 pounds, anemic, pale. He was in the Ed again after 2 days admission for another admission of 5 days duration. Was very sick. He has forgotten all of it. GI MD is Dr Cristina Gong.  Mr. [name] is followed here on regular intervals for cognitive testing and his Mini-Mental status examination in 2018 had revealed 20 out of 30 points, today he scored 21 out of 30 points.  He is not depressed or at least does not endorse any of these depression related symptoms on a questionnaire he is not excessively fatigued but he is certainly getting frailer.  He has a very different posture now and is more stooped, his neck circumference is 16-1/4 inches and is rather slim for a gentleman of his height 5 foot 10.  His body mass index is 20.4.   HISTORY OF PRESENT ILLNESS: Today 07/09/2016 and is Mini-Mental Status Examination revealed 20 out of 30 points today the last evaluation was 25 out of 30. The patient has been stable since summer 2016 with points between 25 and 24 out of 30. Mr. [name], 85 year old male returns for follow-up. He has history of Alzheimer's dementia. He continues to be able to complete his ADLs independently, he does not have a driver's license. He is currently on Aricept 5 mg twice a day for GI  Reasons.  His memory score is stable on Aricept alone. He gets his medications from the  [place]. He does not exercise his memory with puzzles or crosswords etc. He gets no regular exercise.    Had one recent fall, no injuries resulted. His wife is here confirming his memory is more impaired since last year- just last week they went to the cinema together and watched a movie about Laqueta Linden, as a left the cinema Mr. Bazzi asked what the movie was about he had forgotten!. He could also not recall the movie when his wife explained what they had just watched- he believes he was asleep- his wife denied this.  His gait has deteriorated.  Mr Morera grew up in [place] and in [place], [place]. He has a lot of interesting stories about his education at [employer]. in [place] in the 1940's.    I have the pleasure of seeing Mr [name] today. 07-15-2016, We are talking , once gain, about his 6 years in [place] and his long family history with the [place] . His wife is concerned about his short term memory , he is not. MMSE is stable. He will be 89 next month.  No REM behavior.  Had 2 falls I the last 12 month- one while carrying a 25 pound bag of ice melt, refusing any help. One other near fall let to a visit at [facility] ED, was related to arrhythmia. They called Dr. Benn Moulder and were told to come to the ED- no arrhythmia proven.   [name]  MMSE - Mini Mental State Exam 07/16/2018 07/15/2017 07/09/2016 01/04/2016 07/05/2015  Orientation to time 2 4 1 2 3   Orientation to Place 4 4 4 3 3   Registration 3 3 3 3 3   Attention/ Calculation 3 2 4 4 4   Attention/Calculation-comments word not numbers - - - -  Recall 1 1 0 1 1  Language- name 2 objects 2 2 2 2 2   Language- repeat 1 1 1 1 1   Language- follow 3 step command 3 3 3 3 3   Language- read & follow direction 1 1 1 1 1   Write a sentence 1 0 1 1 1   Copy design 0 1 0 1 1  Total score 21 22 20 22 23       HISTORY: Mr. [name] is a 85 year old male with a history of dementia. He returns today for follow-up. He is currently taking Aricept 10 mg and tolerating it  well. The patient states that his memory is "good." his wife reports that she feels that it has remained the same. The patient is able to complete all ADLs independently.  The patient has a driver's licenses but no longer drives. His wife does all the driving.  He denies having to give up anything due to his memory. The wife states that the patient's behavior has not been an issue.   On 01-26-15 the patient scored 25 out of 30 points and the Mini-Mental Status Examination for the last 12 months he has been very stable on Aricept alone. [name], his primary care physician, asked if Namenda could be added and indeed it could be. I am often combined the 2 medications Aricept and Namenda and they can be taken in the generic form or even in a combination product as one pill called Namziric. I will start Mr. [name] on a starter pack for once a day namenda.  He is interested in stopping protonix, and he may get by with Tumms at night. A trial of 4-6 weeks would suffice to show if he is still having reflux.   REVIEW OF SYSTEMS: Full 14 system review of systems performed and notable only for those listed, all others are neg:  Genitourinary kidney stone in the left kidney. Bradycardia-  That's why we don't increase Aricept.  Hematology/Lymphatic: easy bruising Neurological: stable mild - moderate dementia.  Long term memory intact, short term memory much impaired. He has amnesia for the GI bleed related events of 06-05-2018.   ALLERGIES: Allergies  Allergen Reactions  . Amiodarone Other (See Comments)    Had a severe lung infection in 2003  . Antihistamines, Diphenhydramine-Type Other (See Comments)    Jittery    HOME MEDICATIONS: Outpatient Medications Prior to Visit  Medication Sig Dispense Refill  . atorvastatin (LIPITOR) 40 MG tablet Take 40 mg by mouth every evening.     . Cholecalciferol (VITAMIN D) 2000 UNITS CAPS Take 4,000 Units by mouth daily.     [name] donepezil (ARICEPT) 5 MG  tablet Take 1 tablet (5 mg total) by mouth 2 (two) times daily. 180 tablet 3  . finasteride (PROSCAR) 5 MG tablet Take 5 mg by mouth every evening.     . Multiple Vitamin (MULTIVITAMIN WITH MINERALS) TABS tablet Take 1 tablet by mouth daily.    . sertraline (ZOLOFT) 25 MG tablet Take 25 mg by mouth daily.     No facility-administered medications prior to visit.     PAST MEDICAL HISTORY: Past Medical History:  Diagnosis Date  . Atrial fibrillation,  permanent   . CAD (coronary artery disease)   . Dementia ([place])   . Diverticulosis    diverticular bleed 2011  . High cholesterol   . HTN (hypertension)    has improved over time, no rx as of 2019  . Lower GI bleed     PAST SURGICAL HISTORY: Past Surgical History:  Procedure Laterality Date  . angiolplasty    . APPENDECTOMY    . INSERT / REPLACE / [place]    . PILONIDAL CYST EXCISION    . rotator cuff surgery  1985  . TONSILLECTOMY    . triple heart bypass      FAMILY HISTORY: Family History  Problem Relation Age of Onset  . Stroke Father   . Cervical cancer Unknown        siblings  . COPD Sister   . Dementia Brother     SOCIAL HISTORY: Social History   Socioeconomic History  . Marital status: Married    Spouse name: [name]  . Number of children: 6  . Years of education: college  . Highest education level: Not on file  Occupational History  . Occupation: retired  Scientific laboratory technician  . Financial resource strain: Not on file  . Food insecurity:    Worry: Not on file    Inability: Not on file  . Transportation needs:    Medical: Not on file    Non-medical: Not on file  Tobacco Use  . Smoking status: Former Smoker    Last attempt to quit: 07/02/1974    Years since quitting: 44.0  . Smokeless tobacco: Never Used  Substance and Sexual Activity  . Alcohol use: Yes    Comment: 4.5 ounces per day  . Drug use: No  . Sexual activity: Not on file  Lifestyle  . Physical activity:    Days  per week: Not on file    Minutes per session: Not on file  . Stress: Not on file  Relationships  . Social connections:    Talks on phone: Not on file    Gets together: Not on file    Attends religious service: Not on file    Active member of club or organization: Not on file    Attends meetings of clubs or organizations: Not on file    Relationship status: Not on file  . Intimate partner violence:    Fear of current or ex partner: Not on file    Emotionally abused: Not on file    Physically abused: Not on file    Forced sexual activity: Not on file  Other Topics Concern  . Not on file  Social History Narrative   Patient is married [name]) and lives at home with his wife.   Patient has a college education.   Patient is a retired Materials engineer.   Patient does not drink any caffeine.   Patient has six children.     PHYSICAL EXAM  Vitals:   07/16/18 1114  BP: 110/68  Pulse: 60  Weight: 142 lb (64.4 kg)  Height: 5\' 10"  (1.778 m)   Body mass index is 20.37 kg/m. Generalized: Well developed, in no acute distress - pale and slim, unshaven-   Neurological examination / Mentation: Alert oriented to time, place, history taking. F ollows all commands speech and language fluent. He looks pale and frail.  mini-mental status examination- see above. MMSE 21/30. AFT 3. Clock drawing 4/4.  Cranial nerve :  "  things don't taste right" . Pupils were equal round reactive to light. Extraocular movements were full, visual field were full on confrontational test. Facial sensation and strengthwere normal.  Uvula and  tongue midline. " neck hump" has increased. Head turning and shoulder shrug were restricted by his scoliosis Motor:  4 plus / 5 strength in upper extremities, good grip strength. He trembles when providing a grip strength.  Sensory: Sensory testing is intact to soft touch on all 4 extremities. No evidence of extinction is noted.  Coordination: Cerebellar testing  reveals good finger-nose-finger and heel-to-shin bilaterally.  Gait and station:  Posture is stooped , neck hump- Gait is unsteady- his wife supports him sometimes. - uses a walker for longer distances. Tandem gait deferred.  Reflexes: Deep tendon reflexes are not brisk- they are symmetrically expressed  bilaterally. Babinski points down.  DIAGNOSTIC DATA (LABS, IMAGING, TESTING). MMSE   ASSESSMENT AND PLAN : 85  y.o. year old male patient with stabile dementia, cognitive impairment in moderate range for 2.5 years.   Has a past medical history of HTN (hypertension); CAD (coronary artery disease); High cholesterol;  Memory loss; and Dementia.  Chronic anticoagulation for atrial fibrillation- now paused- he has diverticulitis - he had a severe GI bleed and was hospitalized 06-05-2018- needed transfusion. His memory score is stable by MMSE. I am concerned about his risk of stroke from atrial fib. He is not even taking ASA now.   I encouraged hydration and a higher caloric intake to regain weight.  I am looking forward to Dr Buccini's visit notes next week, 07-21-2018.      Follow up in 6 -8 months with me, Dr. [facility] [address], [place], [place] [address] [phone]

## 2018-07-16 NOTE — Progress Notes (Signed)
Thank you for the update MCr

## 2018-07-17 DIAGNOSIS — K921 Melena: Secondary | ICD-10-CM | POA: Diagnosis not present

## 2018-07-17 DIAGNOSIS — G309 Alzheimer's disease, unspecified: Secondary | ICD-10-CM | POA: Diagnosis not present

## 2018-07-17 DIAGNOSIS — F0281 Dementia in other diseases classified elsewhere with behavioral disturbance: Secondary | ICD-10-CM | POA: Diagnosis not present

## 2018-07-17 DIAGNOSIS — I4821 Permanent atrial fibrillation: Secondary | ICD-10-CM | POA: Diagnosis not present

## 2018-07-17 DIAGNOSIS — D62 Acute posthemorrhagic anemia: Secondary | ICD-10-CM | POA: Diagnosis not present

## 2018-07-17 DIAGNOSIS — Z950 Presence of cardiac pacemaker: Secondary | ICD-10-CM | POA: Diagnosis not present

## 2018-07-18 DIAGNOSIS — H52203 Unspecified astigmatism, bilateral: Secondary | ICD-10-CM | POA: Diagnosis not present

## 2018-07-18 DIAGNOSIS — H524 Presbyopia: Secondary | ICD-10-CM | POA: Diagnosis not present

## 2018-07-18 DIAGNOSIS — Z961 Presence of intraocular lens: Secondary | ICD-10-CM | POA: Diagnosis not present

## 2018-07-21 DIAGNOSIS — F0281 Dementia in other diseases classified elsewhere with behavioral disturbance: Secondary | ICD-10-CM | POA: Diagnosis not present

## 2018-07-21 DIAGNOSIS — R932 Abnormal findings on diagnostic imaging of liver and biliary tract: Secondary | ICD-10-CM | POA: Diagnosis not present

## 2018-07-21 DIAGNOSIS — Z950 Presence of cardiac pacemaker: Secondary | ICD-10-CM | POA: Diagnosis not present

## 2018-07-21 DIAGNOSIS — I4821 Permanent atrial fibrillation: Secondary | ICD-10-CM | POA: Diagnosis not present

## 2018-07-21 DIAGNOSIS — G309 Alzheimer's disease, unspecified: Secondary | ICD-10-CM | POA: Diagnosis not present

## 2018-07-21 DIAGNOSIS — D62 Acute posthemorrhagic anemia: Secondary | ICD-10-CM | POA: Diagnosis not present

## 2018-07-21 DIAGNOSIS — K921 Melena: Secondary | ICD-10-CM | POA: Diagnosis not present

## 2018-07-21 DIAGNOSIS — Z8719 Personal history of other diseases of the digestive system: Secondary | ICD-10-CM | POA: Diagnosis not present

## 2018-07-24 DIAGNOSIS — Z950 Presence of cardiac pacemaker: Secondary | ICD-10-CM | POA: Diagnosis not present

## 2018-07-24 DIAGNOSIS — G309 Alzheimer's disease, unspecified: Secondary | ICD-10-CM | POA: Diagnosis not present

## 2018-07-24 DIAGNOSIS — F0281 Dementia in other diseases classified elsewhere with behavioral disturbance: Secondary | ICD-10-CM | POA: Diagnosis not present

## 2018-07-24 DIAGNOSIS — I4821 Permanent atrial fibrillation: Secondary | ICD-10-CM | POA: Diagnosis not present

## 2018-07-24 DIAGNOSIS — K921 Melena: Secondary | ICD-10-CM | POA: Diagnosis not present

## 2018-07-24 DIAGNOSIS — D62 Acute posthemorrhagic anemia: Secondary | ICD-10-CM | POA: Diagnosis not present

## 2018-07-25 ENCOUNTER — Ambulatory Visit (INDEPENDENT_AMBULATORY_CARE_PROVIDER_SITE_OTHER): Payer: Medicare Other | Admitting: Cardiovascular Disease

## 2018-07-25 ENCOUNTER — Encounter: Payer: Self-pay | Admitting: Cardiovascular Disease

## 2018-07-25 ENCOUNTER — Encounter (INDEPENDENT_AMBULATORY_CARE_PROVIDER_SITE_OTHER): Payer: Self-pay

## 2018-07-25 VITALS — BP 110/52 | HR 71 | Ht 71.0 in | Wt 143.8 lb

## 2018-07-25 DIAGNOSIS — I442 Atrioventricular block, complete: Secondary | ICD-10-CM

## 2018-07-25 DIAGNOSIS — Z950 Presence of cardiac pacemaker: Secondary | ICD-10-CM | POA: Diagnosis not present

## 2018-07-25 DIAGNOSIS — I519 Heart disease, unspecified: Secondary | ICD-10-CM | POA: Diagnosis not present

## 2018-07-25 DIAGNOSIS — I2510 Atherosclerotic heart disease of native coronary artery without angina pectoris: Secondary | ICD-10-CM | POA: Diagnosis not present

## 2018-07-25 DIAGNOSIS — I712 Thoracic aortic aneurysm, without rupture: Secondary | ICD-10-CM

## 2018-07-25 DIAGNOSIS — I714 Abdominal aortic aneurysm, without rupture, unspecified: Secondary | ICD-10-CM

## 2018-07-25 DIAGNOSIS — I4821 Permanent atrial fibrillation: Secondary | ICD-10-CM

## 2018-07-25 DIAGNOSIS — I7121 Aneurysm of the ascending aorta, without rupture: Secondary | ICD-10-CM

## 2018-07-25 NOTE — Progress Notes (Signed)
Cardiology Office Note    Date:  07/25/2018   ID:  [name] [name], DOB 05-14-1926, MRN [id number]  PCP:  [name] Manes, MD  Cardiologist:   [name] Klein, MD   Chief Complaint  Patient presents with  . Follow-up  Pacemaker check, CAD, palpitations  History of Present Illness:  [name] [name] is a 85 y.o. male with complete heart block (pacemaker dependent), permanent atrial fibrillation, CAD s/p CABG, Alzheimer's dementia, small ascending thoracic aortic aneurysm (4 cm), small abdominal aortic aneurysm (2.8 cm), hypertension and hyperlipidemia here for follow-up on his pacemaker (2009, [employer] Zephyr XL DR, programmed VVIR) and to discuss anticoagulation therapy.  He had 2 episodes of severe lower GI bleeding in December.  He has become less steady on his feet and he had a serious fall in his bathtub recently.  He is currently off his anticoagulation and has not had any bleeding problems since stopping warfarin.  Both his wife and his son are here to discuss future therapy in view of his declining cognitive status recent problems of bleeding.  He has permanent atrial fibrillation and his Independence dual-chamber pacemaker is programmed VVIR.  The current generator was implanted in 2009 and still has 3.25-4 years of longevity.  The leads were implanted in 2001 and are still functioning at normal parameters.  Device interrogation shows 97% ventricular pacing underlying slow ventricular response with a rate around 40 bpm.  Today he is not pacemaker dependent.  In fact, on his twelve-lead ECG he presents with what appears to be accelerated junctional rhythm at about 70 bpm with a relatively narrow QRS complex.  However, on previous checks he has often not had any underlying escape rhythm.  Dementia has progressed gradually, but he remains well compensated with prompts from his wife and he appears to have good quality of life.   Echo performed in July 2019 shows borderline  LV systolic function with EF of 50-55%, aortic valve sclerosis without stenosis.  He had bypass surgery in 2010 [name] [name], LIMA to LAD, SVG to distal circumflex, SVG to distal RCA) and has not had any signs or symptoms of coronary disease since that time.  He had a surgical maze ablation and ligation of left atrial appendage at that time. His nuclear stress test in August 2014 showed a dense inferolateral scar. Left ventricular ejection fraction was around 50%.   Past Medical History:  Diagnosis Date  . Atrial fibrillation, permanent   . CAD (coronary artery disease)   . Dementia ([place])   . Diverticulosis    diverticular bleed 2011  . High cholesterol   . HTN (hypertension)    has improved over time, no rx as of 2019  . Lower GI bleed     Past Surgical History:  Procedure Laterality Date  . angiolplasty    . APPENDECTOMY    . INSERT / REPLACE / [place]    . PILONIDAL CYST EXCISION    . rotator cuff surgery  1985  . TONSILLECTOMY    . triple heart bypass      Current Medications: Outpatient Medications Prior to Visit  Medication Sig Dispense Refill  . atorvastatin (LIPITOR) 40 MG tablet Take 40 mg by mouth every evening.     . Cholecalciferol (VITAMIN D) 2000 UNITS CAPS Take 4,000 Units by mouth daily.     [name] donepezil (ARICEPT) 5 MG tablet Take 1 tablet (5 mg total) by mouth 2 (  two) times daily. 180 tablet 3  . finasteride (PROSCAR) 5 MG tablet Take 5 mg by mouth every evening.     . Multiple Vitamin (MULTIVITAMIN WITH MINERALS) TABS tablet Take 1 tablet by mouth daily.    . sertraline (ZOLOFT) 25 MG tablet Take 25 mg by mouth daily.     No facility-administered medications prior to visit.      Allergies:   Amiodarone and Antihistamines, diphenhydramine-type   Social History   Socioeconomic History  . Marital status: Married    Spouse name: [name] [name]  . Number of children: 6  . Years of education: college  . Highest education level: Not on  file  Occupational History  . Occupation: retired  Scientific laboratory technician  . Financial resource strain: Not on file  . Food insecurity:    Worry: Not on file    Inability: Not on file  . Transportation needs:    Medical: Not on file    Non-medical: Not on file  Tobacco Use  . Smoking status: Former Smoker    Last attempt to quit: 07/02/1974    Years since quitting: 44.0  . Smokeless tobacco: Never Used  Substance and Sexual Activity  . Alcohol use: Yes    Comment: 4.5 ounces per day  . Drug use: No  . Sexual activity: Not on file  Lifestyle  . Physical activity:    Days per week: Not on file    Minutes per session: Not on file  . Stress: Not on file  Relationships  . Social connections:    Talks on phone: Not on file    Gets together: Not on file    Attends religious service: Not on file    Active member of club or organization: Not on file    Attends meetings of clubs or organizations: Not on file    Relationship status: Not on file  Other Topics Concern  . Not on file  Social History Narrative   Patient is married [name] [name]) and lives at home with his wife.   Patient has a college education.   Patient is a retired Materials engineer.   Patient does not drink any caffeine.   Patient has six children.     Family History:  The patient's family history includes COPD in his sister; Cervical cancer in his unknown relative; Dementia in his brother; Stroke in his father.   ROS:   Please see the history of present illness.    ROS all other systems are reviewed and are negative.  Some limitations due to memory problems.   PHYSICAL EXAM:   VS:  BP (!) 110/52   Pulse 71   Ht 5\' 11"  (1.803 m)   Wt 143 lb 12.8 oz (65.2 kg)   BMI 20.06 kg/m      General: Alert, oriented x3, no distress, now very slender, bordering on malnourished Head: no evidence of trauma, PERRL, EOMI, no exophtalmos or lid lag, no myxedema, no xanthelasma; normal ears, nose and oropharynx Neck: normal  jugular venous pulsations and no hepatojugular reflux; brisk carotid pulses without delay and no carotid bruits Chest: clear to auscultation, no signs of consolidation by percussion or palpation, normal fremitus, symmetrical and full respiratory excursions Cardiovascular: normal position and quality of the apical impulse, regular rhythm, normal first and paradoxically split second heart sounds, 2/6 musical early to mid peaking systolic ejection murmur heard throughout the precordium, no diastolic murmurs, rubs or gallops, healthy right subclavian pacemaker site Abdomen: no tenderness or  distention, no masses by palpation, no abnormal pulsatility or arterial bruits, normal bowel sounds, no hepatosplenomegaly Extremities: no clubbing, cyanosis or edema; 2+ radial, ulnar and brachial pulses bilaterally; 2+ right femoral, posterior tibial and dorsalis pedis pulses; 2+ left femoral, posterior tibial and dorsalis pedis pulses; no subclavian or femoral bruits Neurological: grossly nonfocal Psych: Normal mood and affect    Wt Readings from Last 3 Encounters:  07/25/18 143 lb 12.8 oz (65.2 kg)  07/16/18 142 lb (64.4 kg)  06/17/18 158 lb 3.2 oz (71.8 kg)      Studies/Labs Reviewed:   EKG:  EKG is ordered today.  It shows background atrial fibrillation with what appears to be accelerated junctional rhythm at about 70 bpm  BMET    Component Value Date/Time   NA 140 06/08/2018 0012   K 3.8 06/08/2018 0012   CL 108 06/08/2018 0012   CO2 24 06/08/2018 0012   GLUCOSE 123 (H) 06/08/2018 0012   BUN 19 06/08/2018 0012   CREATININE 1.19 06/08/2018 0012   CALCIUM 8.4 (L) 06/08/2018 0012   GFRNONAA 53 (L) 06/08/2018 0012   GFRAA >60 06/08/2018 0012   Lipid profile July 2019  total cholesterol 139, triglycerides 141, HDL 36, LDL 75  ASSESSMENT:    1. CHB (complete heart block) (HCC)   2. Permanent atrial fibrillation ([place])   3. Coronary artery disease involving native coronary artery of native  heart without angina pectoris   4. Left ventricular systolic dysfunction without heart failure   5. Pacemaker   6. Ascending aortic aneurysm ([place])   7. AAA (abdominal aortic aneurysm) without rupture (HCC)      PLAN:  In order of problems listed above:  1. 2nd/3rd deg AV block: This is the first time in a long time I see no evidence of native AV conduction but he does appear to have atrial fibrillation with slow ventricular response around 40 bpm.  Interestingly on his presentation ECG he had accelerated junctional rhythm.  Important to note that a previous pacemaker checks he sometimes does not have any escape rhythm when he should be considered device dependent. 2. AFib: Slow ventricular response/high-grade AV block without medications. CHADSVasc 4 (age 17, CAD, LV dysf).  Has recently had serious GI bleeding problems and has had at least one dangerous fall.  We talked about the relative risks and benefits of continued anticoagulation therapy.  He does have a history of appendage ligation, so stopping his warfarin would be reasonable if he has complications.  I think we all agree that it is now time to discontinue anticoagulation for safety reasons.  We will not resume warfarin at this time. If he should have embolic events in the future, we could reconsider.   3. CAD s/p CABG: Asymptomatic, no angina pectoris. On high-dose statin, most recent labs are at target or very close to target. 4. Borderline LV dysfunction: He has no have clinical heart failure.  He does not require diuretics.  We are not using beta-blockers or R AAS inhibitors to avoid polypharmacy in this elderly gentleman with cognitive decline and a tendency to fall. 5. PM: Normal pacemaker function.  Not amenable to remote monitoring, will bring him back to the office every 6 months. 6. Ascending aortic aneurysm and abdominal aortic aneurysm: These have been stable in size and minor abnormalities (4 cm and 2.8 cm respectively).  I  would not recommend routine monitoring his age.  I do not think he is a good candidate for either surgical or  device aneurysm exclusion.    Medication Adjustments/Labs and Tests Ordered: Current medicines are reviewed at length with the patient today.  Concerns regarding medicines are outlined above.  Medication changes, Labs and Tests ordered today are listed in the Patient Instructions below. Patient Instructions  Medication Instructions:  Your physician recommends that you continue on your current medications as directed. Please refer to the Current Medication list given to you today.  If you need a refill on your cardiac medications before your next appointment, please call your pharmacy.   Lab work: None ordered If you have labs (blood work) drawn today and your tests are completely normal, you will receive your results only by: [name] MyChart Message (if you have MyChart) OR . A paper copy in the mail If you have any lab test that is abnormal or we need to change your treatment, we will call you to review the results.  Testing/Procedures: None ordered  Follow-Up: At [facility], you and your health needs are our priority.  As part of our continuing mission to provide you with exceptional heart care, we have created designated Provider Care Teams.  These Care Teams include your primary Cardiologist (physician) and Advanced Practice Providers (APPs -  Physician Assistants and Nurse Practitioners) who all work together to provide you with the care you need, when you need it. You will need a follow up appointment in 6 months with Dr.Tiffancy Moger for a Physician pacemaker visit.  Please call our office 2 months in advance to schedule this appointment.  You may see [name] Klein, MD or one of the following Advanced Practice Providers on your designated Care Team: [place], [place] . Fabian Sharp, PA-C        Signed, [name] Klein, MD  07/25/2018 6:27 PM    [address], [place], [place]  [address] Phone: [phone]; Fax: [phone]

## 2018-07-25 NOTE — Patient Instructions (Signed)
Medication Instructions:  Your physician recommends that you continue on your current medications as directed. Please refer to the Current Medication list given to you today.  If you need a refill on your cardiac medications before your next appointment, please call your pharmacy.   Lab work: None ordered If you have labs (blood work) drawn today and your tests are completely normal, you will receive your results only by: [name] MyChart Message (if you have MyChart) OR . A paper copy in the mail If you have any lab test that is abnormal or we need to change your treatment, we will call you to review the results.  Testing/Procedures: None ordered  Follow-Up: At [facility], you and your health needs are our priority.  As part of our continuing mission to provide you with exceptional heart care, we have created designated Provider Care Teams.  These Care Teams include your primary Cardiologist (physician) and Advanced Practice Providers (APPs -  Physician Assistants and Nurse Practitioners) who all work together to provide you with the care you need, when you need it. You will need a follow up appointment in 6 months with Dr.Croitoru for a Physician pacemaker visit.  Please call our office 2 months in advance to schedule this appointment.  You may see [name], MD or one of the following Advanced Practice Providers on your designated Care Team: [place], [place] . Fabian Sharp, PA-C

## 2018-07-31 DIAGNOSIS — D62 Acute posthemorrhagic anemia: Secondary | ICD-10-CM | POA: Diagnosis not present

## 2018-07-31 DIAGNOSIS — G309 Alzheimer's disease, unspecified: Secondary | ICD-10-CM | POA: Diagnosis not present

## 2018-07-31 DIAGNOSIS — I4821 Permanent atrial fibrillation: Secondary | ICD-10-CM | POA: Diagnosis not present

## 2018-07-31 DIAGNOSIS — K921 Melena: Secondary | ICD-10-CM | POA: Diagnosis not present

## 2018-07-31 DIAGNOSIS — Z950 Presence of cardiac pacemaker: Secondary | ICD-10-CM | POA: Diagnosis not present

## 2018-07-31 DIAGNOSIS — F0281 Dementia in other diseases classified elsewhere with behavioral disturbance: Secondary | ICD-10-CM | POA: Diagnosis not present

## 2018-08-05 ENCOUNTER — Ambulatory Visit (INDEPENDENT_AMBULATORY_CARE_PROVIDER_SITE_OTHER): Payer: Medicare Other | Admitting: Podiatry

## 2018-08-05 ENCOUNTER — Encounter: Payer: Self-pay | Admitting: Podiatry

## 2018-08-05 VITALS — BP 145/75

## 2018-08-05 DIAGNOSIS — I2510 Atherosclerotic heart disease of native coronary artery without angina pectoris: Secondary | ICD-10-CM

## 2018-08-05 DIAGNOSIS — M79674 Pain in right toe(s): Secondary | ICD-10-CM

## 2018-08-05 DIAGNOSIS — B351 Tinea unguium: Secondary | ICD-10-CM

## 2018-08-05 DIAGNOSIS — M79675 Pain in left toe(s): Secondary | ICD-10-CM | POA: Diagnosis not present

## 2018-08-05 NOTE — Telephone Encounter (Signed)
error 

## 2018-08-05 NOTE — Progress Notes (Signed)
Complaint:  Visit Type: Patient presents  to my office for  preventative foot care services. Complaint: Patient states" my nails have grown long and thick and become painful to walk and wear shoes"  The patient presents for preventative foot care services.   Podiatric Exam: Vascular: dorsalis pedis and posterior tibial pulses are palpable bilateral. Capillary return is immediate. Temperature gradient is WNL. Skin turgor WNL  Sensorium: Normal Semmes Weinstein monofilament test. Normal tactile sensation bilaterally. Nail Exam: Pt has thick disfigured discolored nails with subungual debris noted bilateral entire nail hallux through fifth toenails Ulcer Exam: There is no evidence of ulcer or pre-ulcerative changes or infection. Orthopedic Exam: Muscle tone and strength are WNL. No limitations in general ROM. No crepitus or effusions noted. Foot type and digits show no abnormalities. Bony prominences are unremarkable. Skin: No Porokeratosis. No infection or ulcers  Diagnosis:  Onychomycosis, , Pain in right toe, pain in left toes  Treatment & Plan Procedures and Treatment: Consent by patient was obtained for treatment procedures.   Debridement of mycotic and hypertrophic toenails, 1 through 5 bilateral and clearing of subungual debris. No ulceration, no infection noted.  Return Visit-Office Procedure: Patient instructed to return to the office for a follow up visit 3 months for continued evaluation and treatment.    Gardiner Barefoot DPM

## 2018-08-07 DIAGNOSIS — Z950 Presence of cardiac pacemaker: Secondary | ICD-10-CM | POA: Diagnosis not present

## 2018-08-07 DIAGNOSIS — K921 Melena: Secondary | ICD-10-CM | POA: Diagnosis not present

## 2018-08-07 DIAGNOSIS — G309 Alzheimer's disease, unspecified: Secondary | ICD-10-CM | POA: Diagnosis not present

## 2018-08-07 DIAGNOSIS — F0281 Dementia in other diseases classified elsewhere with behavioral disturbance: Secondary | ICD-10-CM | POA: Diagnosis not present

## 2018-08-07 DIAGNOSIS — I4821 Permanent atrial fibrillation: Secondary | ICD-10-CM | POA: Diagnosis not present

## 2018-08-07 DIAGNOSIS — D62 Acute posthemorrhagic anemia: Secondary | ICD-10-CM | POA: Diagnosis not present

## 2018-08-08 LAB — CUP PACEART INCLINIC DEVICE CHECK
Date Time Interrogation Session: 20200207103841
Implantable Lead Implant Date: 20010830
Implantable Lead Implant Date: 20010830
Implantable Lead Location: 753859
Implantable Lead Location: 753860
Implantable Pulse Generator Implant Date: 20090722
Pulse Gen Model: 5826
Pulse Gen Serial Number: 1216574

## 2018-08-14 DIAGNOSIS — I4819 Other persistent atrial fibrillation: Secondary | ICD-10-CM | POA: Diagnosis not present

## 2018-08-14 DIAGNOSIS — G301 Alzheimer's disease with late onset: Secondary | ICD-10-CM | POA: Diagnosis not present

## 2018-08-14 DIAGNOSIS — I10 Essential (primary) hypertension: Secondary | ICD-10-CM | POA: Diagnosis not present

## 2018-08-14 DIAGNOSIS — Z950 Presence of cardiac pacemaker: Secondary | ICD-10-CM | POA: Diagnosis not present

## 2018-08-28 ENCOUNTER — Ambulatory Visit: Payer: Medicare Other | Admitting: Podiatry

## 2018-10-23 DIAGNOSIS — I4819 Other persistent atrial fibrillation: Secondary | ICD-10-CM | POA: Diagnosis not present

## 2018-10-23 DIAGNOSIS — L729 Follicular cyst of the skin and subcutaneous tissue, unspecified: Secondary | ICD-10-CM | POA: Diagnosis not present

## 2018-10-23 DIAGNOSIS — G301 Alzheimer's disease with late onset: Secondary | ICD-10-CM | POA: Diagnosis not present

## 2018-10-23 DIAGNOSIS — I10 Essential (primary) hypertension: Secondary | ICD-10-CM | POA: Diagnosis not present

## 2018-11-04 ENCOUNTER — Ambulatory Visit: Payer: Medicare Other | Admitting: Podiatry

## 2018-12-11 ENCOUNTER — Telehealth: Payer: Self-pay | Admitting: Neurology

## 2018-12-11 NOTE — Telephone Encounter (Signed)
Patient's wife called back and gave consent for a virtual visit. I have sent patient an e-mail with link and directions.  Pt understands that although there may be some limitations with this type of visit, we will take all precautions to reduce any security or privacy concerns.  Pt understands that this will be treated like an in office visit and we will file with pt's insurance, and there may be a patient responsible charge related to this service.

## 2018-12-11 NOTE — Telephone Encounter (Signed)
error 

## 2018-12-11 NOTE — Telephone Encounter (Signed)
Due to current COVID 19 pandemic, our office is severely reducing in office visits until further notice, in order to minimize the risk to our patients and healthcare providers.   Called patient and LVM to discuss a virtual visit for his 6/16 appt. I gave my contact information for reference.

## 2018-12-12 ENCOUNTER — Ambulatory Visit (INDEPENDENT_AMBULATORY_CARE_PROVIDER_SITE_OTHER): Payer: Medicare Other | Admitting: Podiatry

## 2018-12-12 ENCOUNTER — Encounter: Payer: Self-pay | Admitting: Podiatry

## 2018-12-12 ENCOUNTER — Ambulatory Visit: Payer: Medicare Other | Admitting: Podiatry

## 2018-12-12 ENCOUNTER — Other Ambulatory Visit: Payer: Self-pay

## 2018-12-12 DIAGNOSIS — M79674 Pain in right toe(s): Secondary | ICD-10-CM

## 2018-12-12 DIAGNOSIS — M79675 Pain in left toe(s): Secondary | ICD-10-CM | POA: Diagnosis not present

## 2018-12-12 DIAGNOSIS — B351 Tinea unguium: Secondary | ICD-10-CM

## 2018-12-12 NOTE — Progress Notes (Signed)
Complaint:  Visit Type: Patient presents  to my office for  preventative foot care services. Complaint: Patient states" my nails have grown long and thick and become painful to walk and wear shoes"  The patient presents for preventative foot care services.   Podiatric Exam: Vascular: dorsalis pedis and posterior tibial pulses are palpable bilateral. Capillary return is immediate. Temperature gradient is WNL. Skin turgor WNL  Sensorium: Normal Semmes Weinstein monofilament test. Normal tactile sensation bilaterally. Nail Exam: Pt has thick disfigured discolored nails with subungual debris noted bilateral entire nail hallux through fifth toenails Ulcer Exam: There is no evidence of ulcer or pre-ulcerative changes or infection. Orthopedic Exam: Muscle tone and strength are WNL. No limitations in general ROM. No crepitus or effusions noted. Foot type and digits show no abnormalities. Bony prominences are unremarkable. Skin: No Porokeratosis. No infection or ulcers  Diagnosis:  Onychomycosis, , Pain in right toe, pain in left toes  Treatment & Plan Procedures and Treatment: Consent by patient was obtained for treatment procedures.   Debridement of mycotic and hypertrophic toenails, 1 through 5 bilateral and clearing of subungual debris. No ulceration, no infection noted.  Return Visit-Office Procedure: Patient instructed to return to the office for a follow up visit 3 months for continued evaluation and treatment.    Gardiner Barefoot DPM

## 2018-12-15 ENCOUNTER — Encounter: Payer: Self-pay | Admitting: Neurology

## 2018-12-15 NOTE — Telephone Encounter (Signed)
Called to review the patients chart with his wife. No answer. LVM for her to call back.

## 2018-12-15 NOTE — Telephone Encounter (Signed)
Patient's wife called to review their chart and made sure that everything was up to date. Patient's wife informed they received the e-mail/text message for the visit. Instructed to make sure they hold on to the e-mail/text for the upcoming appointment as it is necessary to access their appointment. Instructed the patient that apx 30 min prior to the appointment the front staff will contact them to make sure they are ready to go for their appointment in case there is any need for troubleshooting it can be completed prior to the appointment time. Reminded the patient once more that this is treated as a Office visit and the patient must be prepared for the visit and ready at the time of their appointment preferably in a well lit area where they have good connection for the visit. Pt verbalized understanding.

## 2018-12-16 ENCOUNTER — Encounter: Payer: Self-pay | Admitting: Neurology

## 2018-12-16 ENCOUNTER — Other Ambulatory Visit: Payer: Self-pay

## 2018-12-16 ENCOUNTER — Ambulatory Visit (INDEPENDENT_AMBULATORY_CARE_PROVIDER_SITE_OTHER): Payer: Medicare Other | Admitting: Neurology

## 2018-12-16 DIAGNOSIS — G301 Alzheimer's disease with late onset: Secondary | ICD-10-CM

## 2018-12-16 DIAGNOSIS — Z7901 Long term (current) use of anticoagulants: Secondary | ICD-10-CM | POA: Diagnosis not present

## 2018-12-16 DIAGNOSIS — I2510 Atherosclerotic heart disease of native coronary artery without angina pectoris: Secondary | ICD-10-CM

## 2018-12-16 DIAGNOSIS — R627 Adult failure to thrive: Secondary | ICD-10-CM

## 2018-12-16 DIAGNOSIS — F0281 Dementia in other diseases classified elsewhere with behavioral disturbance: Secondary | ICD-10-CM

## 2018-12-16 NOTE — Progress Notes (Signed)
[facility]  PATIENT: [name] [name] DOB: May 27, 1926   REASON FOR VISIT: follow-up for memory loss/dementia and chronic persistent atrial fibrillation   Virtual Visit via Video Note  I connected with [name] [name] on 12/16/18 at  2:30 PM EDT by a video enabled telemedicine application and verified that I am speaking with the correct person using two identifiers.  Location: Patient: at home Provider: at [facility]    I discussed the limitations of evaluation and management by telemedicine and the availability of in person appointments. The patient expressed understanding and agreed to proceed.  History of Present Illness: 12-16-2018  Chronic atrial fibrillation, status post Gi bleed, heart block, and dementia. The patient appears frail and sleepy- he was able to report stories from his youth in [place].  He has neither fainted not fallen in the last 4 month   HISTORY FROM: The patient and his wife are on the video.    RV 07-16-2018:  I have the pleasure of seeing [name] [name] today here in the presence of his spouse, he is a 85 year old gentleman and his next Thursday will be on 21 February.  [name] [name] was hospitalized after a fall in the night of 06-05-2018,he had bled- had not passed out. Wife and son couldn't raise him up, the EMS was called. He  had a GI-bleed related to diverticulitis and was on coumadin- lost 11 pounds, anemic, pale. He was in the Ed again after 2 days admission for another admission of 5 days duration. Was very sick. He has forgotten all of it. GI MD is Dr Cristina Gong.  [name]. [name] is followed here on regular intervals for cognitive testing and his Mini-Mental status examination in 2018 had revealed 20 out of 30 points, today he scored 21 out of 30 points.  He is not depressed or at least does not endorse any of these depression related symptoms on a questionnaire he is not excessively fatigued but he is certainly getting frailer.  He has a very  different posture now and is more stooped, his neck circumference is 16-1/4 inches and is rather slim for a gentleman of his height 5 foot 10.  His body mass index is 20.4.   HISTORY OF PRESENT ILLNESS: Today 07/09/2016 and is Mini-Mental Status Examination revealed 20 out of 30 points today the last evaluation was 25 out of 30. The patient has been stable since summer 2016 with points between 25 and 24 out of 30. [name]. [name], 85 year old male returns for follow-up. He has history of Alzheimer's dementia. He continues to be able to complete his ADLs independently, he does not have a driver's license. He is currently on Aricept 5 mg twice a day for GI  Reasons.  His memory score is stable on Aricept alone. He gets his medications from the [place]. He does not exercise his memory with puzzles or crosswords etc. He gets no regular exercise.    Had one recent fall, no injuries resulted. His wife is here confirming his memory is more impaired since last year- just last week they went to the cinema together and watched a movie about Laqueta Linden, as a left the cinema [name]. [name] asked what the movie was about he had forgotten!. He could also not recall the movie when his wife explained what they had just watched- he believes he was asleep- his wife denied this.  His gait has deteriorated.  [name] [name] grew up in [place] and in [place], [place]. He has a  lot of interesting stories about his education at [employer]. in [place] in the 1940's.    I have the pleasure of seeing [name] [name] today. 07-15-2016, We are talking , once gain, about his 6 years in [place] and his long family history with the [place] . His wife is concerned about his short term memory , he is not. MMSE is stable. He will be 89 next month.  No REM behavior.  Had 2 falls I the last 12 month- one while carrying a 25 pound bag of ice melt, refusing any help. One other near fall let to a visit at [facility] ED, was related to arrhythmia. They called Dr. Benn Moulder  and were told to come to the ED- no arrhythmia proven.   [name]  MMSE - Mini Mental State Exam 07/16/2018 07/15/2017 07/09/2016 01/04/2016 07/05/2015  Orientation to time 2 4 1 2 3   Orientation to Place 4 4 4 3 3   Registration 3 3 3 3 3   Attention/ Calculation 3 2 4 4 4   Attention/Calculation-comments word not numbers - - - -  Recall 1 1 0 1 1  Language- name 2 objects 2 2 2 2 2   Language- repeat 1 1 1 1 1   Language- follow 3 step command 3 3 3 3 3   Language- read & follow direction 1 1 1 1 1   Write a sentence 1 0 1 1 1   Copy design 0 1 0 1 1  Total score 21 22 20 22 23       HISTORY: [name]. [name] is a 86 year old male with a history of dementia. He returns today for follow-up. He is currently taking Aricept 10 mg and tolerating it well. The patient states that his memory is "good." his wife reports that she feels that it has remained the same. The patient is able to complete all ADLs independently.  The patient has a driver's licenses but no longer drives. His wife does all the driving.  He denies having to give up anything due to his memory. The wife states that the patient's behavior has not been an issue.   On 01-26-15 the patient scored 25 out of 30 points and the Mini-Mental Status Examination for the last 12 months he has been very stable on Aricept alone. [name], his primary care physician, asked if Namenda could be added and indeed it could be. I am often combined the 2 medications Aricept and Namenda and they can be taken in the generic form or even in a combination product as one pill called Namziric. I will start [name]. [name] on a starter pack for once a day namenda.  He is interested in stopping protonix, and he may get by with Tumms at night. A trial of 4-6 weeks would suffice to show if he is still having reflux.   REVIEW OF SYSTEMS: Full 14 system review of systems performed and notable only for those listed, all others are neg:  Genitourinary kidney stone in the left kidney.  Bradycardia-  That's why we don't increase Aricept.  Hematology/Lymphatic: easy bruising Neurological: stable mild - moderate dementia.  Long term memory intact, short term memory much impaired. He has amnesia for the GI bleed related events of 06-05-2018.   ALLERGIES: Allergies  Allergen Reactions  . Amiodarone Other (See Comments)    Had a severe lung infection in 2003  . Antihistamines, Diphenhydramine-Type Other (See Comments)    Jittery    HOME MEDICATIONS: Outpatient Medications Prior to Visit  Medication Sig  Dispense Refill  . atorvastatin (LIPITOR) 40 MG tablet Take 40 mg by mouth every evening.     . Cholecalciferol (VITAMIN D) 2000 UNITS CAPS Take 4,000 Units by mouth daily.     [name] donepezil (ARICEPT) 5 MG tablet Take 1 tablet (5 mg total) by mouth 2 (two) times daily. 180 tablet 3  . finasteride (PROSCAR) 5 MG tablet Take 5 mg by mouth every evening.     . Multiple Vitamin (MULTIVITAMIN WITH MINERALS) TABS tablet Take 1 tablet by mouth daily.    . sertraline (ZOLOFT) 25 MG tablet Take 25 mg by mouth daily.     No facility-administered medications prior to visit.     PAST MEDICAL HISTORY: Past Medical History:  Diagnosis Date  . Atrial fibrillation, permanent   . CAD (coronary artery disease)   . Dementia ([place])   . Diverticulosis    diverticular bleed 2011  . High cholesterol   . HTN (hypertension)    has improved over time, no rx as of 2019  . Lower GI bleed     PAST SURGICAL HISTORY: Past Surgical History:  Procedure Laterality Date  . angiolplasty    . APPENDECTOMY    . INSERT / REPLACE / [place]    . PILONIDAL CYST EXCISION    . rotator cuff surgery  1985  . TONSILLECTOMY    . triple heart bypass      FAMILY HISTORY: Family History  Problem Relation Age of Onset  . Stroke Father   . Cervical cancer Other        siblings  . COPD Sister   . Dementia Brother     SOCIAL HISTORY: Social History   Socioeconomic  History  . Marital status: Married    Spouse name: [name]  . Number of children: 6  . Years of education: college  . Highest education level: Not on file  Occupational History  . Occupation: retired  Scientific laboratory technician  . Financial resource strain: Not on file  . Food insecurity    Worry: Not on file    Inability: Not on file  . Transportation needs    Medical: Not on file    Non-medical: Not on file  Tobacco Use  . Smoking status: Former Smoker    Quit date: 07/02/1974    Years since quitting: 44.4  . Smokeless tobacco: Never Used  Substance and Sexual Activity  . Alcohol use: Not Currently    Comment: last used in dec 2019  . Drug use: No  . Sexual activity: Not on file  Lifestyle  . Physical activity    Days per week: Not on file    Minutes per session: Not on file  . Stress: Not on file  Relationships  . Social Herbalist on phone: Not on file    Gets together: Not on file    Attends religious service: Not on file    Active member of club or organization: Not on file    Attends meetings of clubs or organizations: Not on file    Relationship status: Not on file  . Intimate partner violence    Fear of current or ex partner: Not on file    Emotionally abused: Not on file    Physically abused: Not on file    Forced sexual activity: Not on file  Other Topics Concern  . Not on file  Social History Narrative   Patient is married [name])  and lives at home with his wife.   Patient has a college education.   Patient is a retired Materials engineer.   Patient does not drink any caffeine.   Patient has six children.    Observation:   There were no vitals filed for this visit. There is no height or weight on file to calculate BMI.  Weights according to wife 147# in clothes.  Generalized: Well developed, in no acute distress - pale and slim, unshaven- I asked him how he sleeps- he answered' with my eyes closed" , he is often at night up to go to the  bathroom.    Neurological examination / Mentation: . He looks pale and frail.  Trembling voice, dysphonia.  He appears extremely fatigued. He sleeps more in daytime.  mini-mental status examination- see above. last MMSE 21/30. AFT 3. Clock drawing 4/4.  Cranial nerve :  "things don't taste right" . Pupils were equal Extraocular movements were full, visual field were full on confrontational test. Facial movements symmetric normal.  Uvula and  tongue midline. " neck hump" has increased. Tongue is pink.   Head turning and shoulder shrug were restricted by his scoliosis .  DIAGNOSTIC DATA (LABS, IMAGING, TESTING). MMSE- reviewed last tests - I cannot perform this test on video  Assessment and Plan:85  y.o. year old male patient with slowly progressive stabile dementia, cognitive impairment in moderate range for 3years.   Has a past medical history of HTN (hypertension); CAD (coronary artery disease); High cholesterol;  Memory loss; and Dementia.  Chronic anticoagulation for atrial fibrillation- now paused- he has diverticulitis - he had a severe GI bleed and was hospitalized 06-05-2018- needed transfusion.  His memory score was stable by MMSE. I am concerned about his risk of stroke from atrial fib. He is not even taking ASA now,  encouraged hydration and a higher caloric intake to regain weight.  He is not a candidate for colonoscopy.  I am looking forward to Dr Buccini's visit notes next week, 07-21-2018.    Follow Up Instructions: Rv in 4-6 month with NP and new MMSE.     I discussed the assessment and treatment plan with the patient. The patient was provided an opportunity to ask questions and all were answered. The patient agreed with the plan and demonstrated an understanding of the instructions.   The patient was advised to call back or seek an in-person evaluation if the symptoms worsen or if the condition fails to improve as anticipated.  20 minutes of non-face-to-face time during  this encounter.   Larey Seat, MD   Follow up in 77months with NP alternating , Dr. [facility] [address] [place], [place] [address] [phone]

## 2018-12-24 DIAGNOSIS — K7030 Alcoholic cirrhosis of liver without ascites: Secondary | ICD-10-CM | POA: Diagnosis not present

## 2018-12-24 DIAGNOSIS — I700 Atherosclerosis of aorta: Secondary | ICD-10-CM | POA: Diagnosis not present

## 2018-12-24 DIAGNOSIS — Z0000 Encounter for general adult medical examination without abnormal findings: Secondary | ICD-10-CM | POA: Diagnosis not present

## 2018-12-24 DIAGNOSIS — E7800 Pure hypercholesterolemia, unspecified: Secondary | ICD-10-CM | POA: Diagnosis not present

## 2018-12-24 DIAGNOSIS — G301 Alzheimer's disease with late onset: Secondary | ICD-10-CM | POA: Diagnosis not present

## 2018-12-24 DIAGNOSIS — Z1389 Encounter for screening for other disorder: Secondary | ICD-10-CM | POA: Diagnosis not present

## 2018-12-24 DIAGNOSIS — I714 Abdominal aortic aneurysm, without rupture: Secondary | ICD-10-CM | POA: Diagnosis not present

## 2018-12-25 ENCOUNTER — Emergency Department (HOSPITAL_COMMUNITY): Payer: Medicare Other

## 2018-12-25 ENCOUNTER — Inpatient Hospital Stay (HOSPITAL_COMMUNITY)
Admission: EM | Admit: 2018-12-25 | Discharge: 2018-12-29 | DRG: 481 | Disposition: A | Discharge status: Skilled Nursing Facility | Payer: Medicare Other | Attending: Internal Medicine | Admitting: Internal Medicine

## 2018-12-25 ENCOUNTER — Other Ambulatory Visit: Payer: Self-pay

## 2018-12-25 DIAGNOSIS — T50905A Adverse effect of unspecified drugs, medicaments and biological substances, initial encounter: Secondary | ICD-10-CM | POA: Diagnosis not present

## 2018-12-25 DIAGNOSIS — S299XXA Unspecified injury of thorax, initial encounter: Secondary | ICD-10-CM | POA: Diagnosis not present

## 2018-12-25 DIAGNOSIS — R29898 Other symptoms and signs involving the musculoskeletal system: Secondary | ICD-10-CM | POA: Diagnosis not present

## 2018-12-25 DIAGNOSIS — Z951 Presence of aortocoronary bypass graft: Secondary | ICD-10-CM | POA: Diagnosis not present

## 2018-12-25 DIAGNOSIS — I10 Essential (primary) hypertension: Secondary | ICD-10-CM | POA: Diagnosis present

## 2018-12-25 DIAGNOSIS — Z87891 Personal history of nicotine dependence: Secondary | ICD-10-CM

## 2018-12-25 DIAGNOSIS — S72142D Displaced intertrochanteric fracture of left femur, subsequent encounter for closed fracture with routine healing: Secondary | ICD-10-CM | POA: Diagnosis not present

## 2018-12-25 DIAGNOSIS — Z66 Do not resuscitate: Secondary | ICD-10-CM | POA: Diagnosis present

## 2018-12-25 DIAGNOSIS — M25552 Pain in left hip: Secondary | ICD-10-CM | POA: Diagnosis present

## 2018-12-25 DIAGNOSIS — I2510 Atherosclerotic heart disease of native coronary artery without angina pectoris: Secondary | ICD-10-CM | POA: Diagnosis present

## 2018-12-25 DIAGNOSIS — Z825 Family history of asthma and other chronic lower respiratory diseases: Secondary | ICD-10-CM | POA: Diagnosis not present

## 2018-12-25 DIAGNOSIS — Z7389 Other problems related to life management difficulty: Secondary | ICD-10-CM | POA: Diagnosis not present

## 2018-12-25 DIAGNOSIS — M2550 Pain in unspecified joint: Secondary | ICD-10-CM | POA: Diagnosis not present

## 2018-12-25 DIAGNOSIS — Z79899 Other long term (current) drug therapy: Secondary | ICD-10-CM

## 2018-12-25 DIAGNOSIS — S72142A Displaced intertrochanteric fracture of left femur, initial encounter for closed fracture: Secondary | ICD-10-CM | POA: Diagnosis present

## 2018-12-25 DIAGNOSIS — R531 Weakness: Secondary | ICD-10-CM | POA: Diagnosis not present

## 2018-12-25 DIAGNOSIS — S0990XA Unspecified injury of head, initial encounter: Secondary | ICD-10-CM | POA: Diagnosis not present

## 2018-12-25 DIAGNOSIS — R2681 Unsteadiness on feet: Secondary | ICD-10-CM | POA: Diagnosis not present

## 2018-12-25 DIAGNOSIS — R52 Pain, unspecified: Secondary | ICD-10-CM | POA: Diagnosis not present

## 2018-12-25 DIAGNOSIS — D62 Acute posthemorrhagic anemia: Secondary | ICD-10-CM | POA: Diagnosis not present

## 2018-12-25 DIAGNOSIS — I712 Thoracic aortic aneurysm, without rupture: Secondary | ICD-10-CM | POA: Diagnosis present

## 2018-12-25 DIAGNOSIS — K5790 Diverticulosis of intestine, part unspecified, without perforation or abscess without bleeding: Secondary | ICD-10-CM | POA: Diagnosis not present

## 2018-12-25 DIAGNOSIS — E785 Hyperlipidemia, unspecified: Secondary | ICD-10-CM | POA: Diagnosis present

## 2018-12-25 DIAGNOSIS — I714 Abdominal aortic aneurysm, without rupture: Secondary | ICD-10-CM | POA: Diagnosis present

## 2018-12-25 DIAGNOSIS — I4821 Permanent atrial fibrillation: Secondary | ICD-10-CM | POA: Diagnosis present

## 2018-12-25 DIAGNOSIS — Z7401 Bed confinement status: Secondary | ICD-10-CM | POA: Diagnosis not present

## 2018-12-25 DIAGNOSIS — K5903 Drug induced constipation: Secondary | ICD-10-CM | POA: Diagnosis not present

## 2018-12-25 DIAGNOSIS — Z9181 History of falling: Secondary | ICD-10-CM | POA: Diagnosis not present

## 2018-12-25 DIAGNOSIS — Z888 Allergy status to other drugs, medicaments and biological substances status: Secondary | ICD-10-CM | POA: Diagnosis not present

## 2018-12-25 DIAGNOSIS — I442 Atrioventricular block, complete: Secondary | ICD-10-CM | POA: Diagnosis present

## 2018-12-25 DIAGNOSIS — Z823 Family history of stroke: Secondary | ICD-10-CM | POA: Diagnosis not present

## 2018-12-25 DIAGNOSIS — S72002A Fracture of unspecified part of neck of left femur, initial encounter for closed fracture: Secondary | ICD-10-CM | POA: Diagnosis not present

## 2018-12-25 DIAGNOSIS — G309 Alzheimer's disease, unspecified: Secondary | ICD-10-CM | POA: Diagnosis present

## 2018-12-25 DIAGNOSIS — M6281 Muscle weakness (generalized): Secondary | ICD-10-CM | POA: Diagnosis not present

## 2018-12-25 DIAGNOSIS — W010XXA Fall on same level from slipping, tripping and stumbling without subsequent striking against object, initial encounter: Secondary | ICD-10-CM | POA: Diagnosis present

## 2018-12-25 DIAGNOSIS — Y92239 Unspecified place in hospital as the place of occurrence of the external cause: Secondary | ICD-10-CM | POA: Diagnosis not present

## 2018-12-25 DIAGNOSIS — E7800 Pure hypercholesterolemia, unspecified: Secondary | ICD-10-CM | POA: Diagnosis present

## 2018-12-25 DIAGNOSIS — Z1159 Encounter for screening for other viral diseases: Secondary | ICD-10-CM | POA: Diagnosis not present

## 2018-12-25 DIAGNOSIS — F0391 Unspecified dementia with behavioral disturbance: Secondary | ICD-10-CM | POA: Diagnosis present

## 2018-12-25 DIAGNOSIS — Z20828 Contact with and (suspected) exposure to other viral communicable diseases: Secondary | ICD-10-CM | POA: Diagnosis not present

## 2018-12-25 DIAGNOSIS — Z950 Presence of cardiac pacemaker: Secondary | ICD-10-CM | POA: Diagnosis not present

## 2018-12-25 DIAGNOSIS — Z419 Encounter for procedure for purposes other than remedying health state, unspecified: Secondary | ICD-10-CM

## 2018-12-25 DIAGNOSIS — F0390 Unspecified dementia without behavioral disturbance: Secondary | ICD-10-CM | POA: Diagnosis present

## 2018-12-25 DIAGNOSIS — I739 Peripheral vascular disease, unspecified: Secondary | ICD-10-CM | POA: Diagnosis present

## 2018-12-25 DIAGNOSIS — N3946 Mixed incontinence: Secondary | ICD-10-CM | POA: Diagnosis not present

## 2018-12-25 DIAGNOSIS — N400 Enlarged prostate without lower urinary tract symptoms: Secondary | ICD-10-CM | POA: Diagnosis present

## 2018-12-25 DIAGNOSIS — W19XXXA Unspecified fall, initial encounter: Secondary | ICD-10-CM | POA: Diagnosis not present

## 2018-12-25 DIAGNOSIS — Z03818 Encounter for observation for suspected exposure to other biological agents ruled out: Secondary | ICD-10-CM | POA: Diagnosis not present

## 2018-12-25 DIAGNOSIS — I482 Chronic atrial fibrillation, unspecified: Secondary | ICD-10-CM | POA: Diagnosis not present

## 2018-12-25 LAB — CBC
HCT: 36.6 % — ABNORMAL LOW (ref 39.0–52.0)
Hemoglobin: 11.4 g/dL — ABNORMAL LOW (ref 13.0–17.0)
MCH: 29.9 pg (ref 26.0–34.0)
MCHC: 31.1 g/dL (ref 30.0–36.0)
MCV: 96.1 fL (ref 80.0–100.0)
Platelets: 234 10*3/uL (ref 150–400)
RBC: 3.81 MIL/uL — ABNORMAL LOW (ref 4.22–5.81)
RDW: 15.9 % — ABNORMAL HIGH (ref 11.5–15.5)
WBC: 9.8 10*3/uL (ref 4.0–10.5)
nRBC: 0 % (ref 0.0–0.2)

## 2018-12-25 LAB — COMPREHENSIVE METABOLIC PANEL
ALT: 17 U/L (ref 0–44)
AST: 29 U/L (ref 15–41)
Albumin: 3.1 g/dL — ABNORMAL LOW (ref 3.5–5.0)
Alkaline Phosphatase: 99 U/L (ref 38–126)
Anion gap: 8 (ref 5–15)
BUN: 24 mg/dL — ABNORMAL HIGH (ref 8–23)
CO2: 27 mmol/L (ref 22–32)
Calcium: 9.1 mg/dL (ref 8.9–10.3)
Chloride: 103 mmol/L (ref 98–111)
Creatinine, Ser: 1.17 mg/dL (ref 0.61–1.24)
GFR calc Af Amer: >60 mL/min (ref >60)
GFR calc non Af Amer: 54 mL/min — ABNORMAL LOW (ref >60)
Glucose, Bld: 95 mg/dL (ref 70–99)
Potassium: 4.4 mmol/L (ref 3.5–5.1)
Sodium: 138 mmol/L (ref 135–145)
Total Bilirubin: 0.2 mg/dL — ABNORMAL LOW (ref 0.3–1.2)
Total Protein: 7.6 g/dL (ref 6.5–8.1)

## 2018-12-25 LAB — SARS CORONAVIRUS 2 BY RT PCR (HOSPITAL ORDER, PERFORMED IN ~~LOC~~ HOSPITAL LAB): SARS Coronavirus 2: NEGATIVE

## 2018-12-25 MED ORDER — FINASTERIDE 5 MG PO TABS
5.0000 mg | ORAL_TABLET | Freq: Every evening | ORAL | Status: DC
  Administered 2018-12-26 – 2018-12-28 (×3): 5 mg via ORAL
  Filled 2018-12-25 (×3): qty 1

## 2018-12-25 MED ORDER — HYDROCODONE-ACETAMINOPHEN 5-325 MG PO TABS: 1.0000 | ORAL_TABLET | Freq: Four times a day (QID) | ORAL | Status: DC | PRN

## 2018-12-25 MED ORDER — SENNOSIDES-DOCUSATE SODIUM 8.6-50 MG PO TABS: 1.0000 | ORAL_TABLET | Freq: Every evening | ORAL | Status: DC | PRN

## 2018-12-25 MED ORDER — DONEPEZIL HCL 5 MG PO TABS
5.0000 mg | ORAL_TABLET | Freq: Two times a day (BID) | ORAL | Status: DC
  Administered 2018-12-26 – 2018-12-29 (×7): 5 mg via ORAL
  Filled 2018-12-25 (×7): qty 1

## 2018-12-25 MED ORDER — ATORVASTATIN CALCIUM 40 MG PO TABS
40.0000 mg | ORAL_TABLET | Freq: Every evening | ORAL | Status: DC
  Administered 2018-12-26 – 2018-12-28 (×3): 40 mg via ORAL
  Filled 2018-12-25 (×3): qty 1

## 2018-12-25 MED ORDER — MORPHINE SULFATE (PF) 2 MG/ML IV SOLN
0.5000 mg | INTRAVENOUS | Status: DC | PRN
  Administered 2018-12-26 (×2): 0.5 mg via INTRAVENOUS
  Filled 2018-12-25 (×2): qty 1

## 2018-12-25 MED ORDER — SERTRALINE HCL 25 MG PO TABS
25.0000 mg | ORAL_TABLET | Freq: Every day | ORAL | Status: DC
  Administered 2018-12-26 – 2018-12-29 (×4): 25 mg via ORAL
  Filled 2018-12-25 (×4): qty 1

## 2018-12-25 MED ORDER — VITAMIN D 25 MCG (1000 UNIT) PO TABS
2000.0000 [IU] | ORAL_TABLET | Freq: Every day | ORAL | Status: DC
  Administered 2018-12-26 – 2018-12-29 (×4): 2000 [IU] via ORAL
  Filled 2018-12-25 (×4): qty 2

## 2018-12-25 MED ORDER — HYDROMORPHONE HCL 1 MG/ML IJ SOLN: 0.5000 mg | Freq: Once | INTRAMUSCULAR | Status: DC

## 2018-12-25 MED ORDER — ACETAMINOPHEN 325 MG PO TABS: 650.0000 mg | ORAL_TABLET | Freq: Four times a day (QID) | ORAL | Status: DC | PRN

## 2018-12-25 MED ORDER — DEXTROSE-NACL 5-0.45 % IV SOLN
INTRAVENOUS | Status: AC
  Administered 2018-12-26: 03:00:00 via INTRAVENOUS

## 2018-12-25 NOTE — ED Notes (Signed)
Will obtain EKG and blood when pt is back from radiology

## 2018-12-25 NOTE — Progress Notes (Signed)
I am aware of patient and will see in the morning for consultation.  Plan is for surgical stabilization tomorrow afternoon pending medical clearance.  Azucena Cecil, MD [place] 10:36 PM

## 2018-12-25 NOTE — ED Notes (Signed)
Kadon Andrus [phone] is pt's wife

## 2018-12-25 NOTE — ED Notes (Signed)
Patient transported to X-ray 

## 2018-12-25 NOTE — ED Notes (Signed)
Bed: BP10 Expected date:  Expected time:  Means of arrival:  Comments: [name]

## 2018-12-25 NOTE — ED Triage Notes (Signed)
Per EMS: Pt is coming from home with c/o hip pain and rotation after a concrete fall. Pt reports back pain as well. Pt has a pelvic binder on at this time and denies LOC, hitting his head, and is not reported to be on blood thinners.  Pt reportedly lives at home with his wife and has a hx of dementia.

## 2018-12-25 NOTE — ED Notes (Signed)
ED TO INPATIENT HANDOFF REPORT  Name/Age/Gender [name] [name] 85 y.o. male  Code Status Code Status History    Date Active Date Inactive Code Status Order ID Comments User Context   06/08/2018 0232 06/11/2018 1713 DNR [id number]  Norval Morton, MD ED   06/05/2018 1048 06/07/2018 1420 DNR [id number]  Karmen Bongo, MD ED   Advance Care Planning Activity    Questions for Most Recent Historical Code Status (Order [id number])    Question Answer Comment   In the event of cardiac or respiratory ARREST Do not call a "code blue"    In the event of cardiac or respiratory ARREST Do not perform Intubation, CPR, defibrillation or ACLS    In the event of cardiac or respiratory ARREST Use medication by any route, position, wound care, and other measures to relive pain and suffering. May use oxygen, suction and manual treatment of airway obstruction as needed for comfort.       Home/SNF/Other Home  Chief Complaint Fall with Hip Pain  Level of Care/Admitting Diagnosis ED Disposition    ED Disposition Condition [place]: [facility] [[id number]]  Level of Care: Med-Surg [16]  Covid Evaluation: Screening Protocol (No Symptoms)  Diagnosis: Closed intertrochanteric fracture of hip, left, initial encounter [facility]) [[id number]]  Admitting Physician: Lenore Cordia [[id number]]  Attending Physician: Lenore Cordia [[id number]]  Estimated length of stay: past midnight tomorrow  Certification:: I certify this patient will need inpatient services for at least 2 midnights  PT Class (Do Not Modify): Inpatient [101]  PT Acc Code (Do Not Modify): Private [1]       Medical History Past Medical History:  Diagnosis Date  . Atrial fibrillation, permanent   . CAD (coronary artery disease)   . Dementia ([place])   . Diverticulosis    diverticular bleed 2011  . High cholesterol   . HTN (hypertension)    has improved over time, no rx as of 2019  . Lower GI bleed      Allergies Allergies  Allergen Reactions  . Amiodarone Other (See Comments)    Had a severe lung infection in 2003  . Antihistamines, Diphenhydramine-Type Other (See Comments)    Jittery    IV Location/Drains/Wounds Patient Lines/Drains/Airways Status   Active Line/Drains/Airways    Name:   Placement date:   Placement time:   Site:   Days:   Peripheral IV 12/25/18 Forearm   12/25/18    2203    Forearm   less than 1   External Urinary Catheter   06/09/18    1640    -   199          Labs/Imaging Results for orders placed or performed during the hospital encounter of 12/25/18 (from the past 48 hour(s))  CBC     Status: Abnormal   Collection Time: 12/25/18  9:06 PM  Result Value Ref Range   WBC 9.8 4.0 - 10.5 K/uL   RBC 3.81 (L) 4.22 - 5.81 MIL/uL   Hemoglobin 11.4 (L) 13.0 - 17.0 g/dL   HCT 36.6 (L) 39.0 - 52.0 %   MCV 96.1 80.0 - 100.0 fL   MCH 29.9 26.0 - 34.0 pg   MCHC 31.1 30.0 - 36.0 g/dL   RDW 15.9 (H) 11.5 - 15.5 %   Platelets 234 150 - 400 K/uL   nRBC 0.0 0.0 - 0.2 %    Comment: Performed at [facility], [address]  [place], [place] [address]  Comprehensive metabolic panel     Status: Abnormal   Collection Time: 12/25/18  9:06 PM  Result Value Ref Range   Sodium 138 135 - 145 mmol/L   Potassium 4.4 3.5 - 5.1 mmol/L   Chloride 103 98 - 111 mmol/L   CO2 27 22 - 32 mmol/L   Glucose, Bld 95 70 - 99 mg/dL   BUN 24 (H) 8 - 23 mg/dL   Creatinine, Ser 1.17 0.61 - 1.24 mg/dL   Calcium 9.1 8.9 - 10.3 mg/dL   Total Protein 7.6 6.5 - 8.1 g/dL   Albumin 3.1 (L) 3.5 - 5.0 g/dL   AST 29 15 - 41 U/L   ALT 17 0 - 44 U/L   Alkaline Phosphatase 99 38 - 126 U/L   Total Bilirubin 0.2 (L) 0.3 - 1.2 mg/dL   GFR calc non Af Amer 54 (L) >60 mL/min   GFR calc Af Amer >60 >60 mL/min   Anion gap 8 5 - 15    Comment: Performed at [facility], [place] [address], [place], [place] [address]  SARS Coronavirus 2 (CEPHEID - Performed in [place] hospital lab), Hosp Order     Status: None   Collection Time: 12/25/18  9:47 PM   Specimen: Nasopharyngeal Swab  Result Value Ref Range   SARS Coronavirus 2 NEGATIVE NEGATIVE    Comment: (NOTE) If result is NEGATIVE SARS-CoV-2 target nucleic acids are NOT DETECTED. The SARS-CoV-2 RNA is generally detectable in upper and lower  respiratory specimens during the acute phase of infection. The lowest  concentration of SARS-CoV-2 viral copies this assay can detect is 250  copies / mL. A negative result does not preclude SARS-CoV-2 infection  and should not be used as the sole basis for treatment or other  patient management decisions.  A negative result may occur with  improper specimen collection / handling, submission of specimen other  than nasopharyngeal swab, presence of viral mutation(s) within the  areas targeted by this assay, and inadequate number of viral copies  (<250 copies / mL). A negative result must be combined with clinical  observations, patient history, and epidemiological information. If result is POSITIVE SARS-CoV-2 target nucleic acids are DETECTED. The SARS-CoV-2 RNA is generally detectable in upper and lower  respiratory specimens dur ing the acute phase of infection.  Positive  results are indicative of active infection with SARS-CoV-2.  Clinical  correlation with patient history and other diagnostic information is  necessary to determine patient infection status.  Positive results do  not rule out bacterial infection or co-infection with other viruses. If result is PRESUMPTIVE POSTIVE SARS-CoV-2 nucleic acids MAY BE PRESENT.   A presumptive positive result was obtained on the submitted specimen  and confirmed on repeat testing.  While 2019 novel coronavirus  (SARS-CoV-2) nucleic acids may be present in the submitted sample  additional confirmatory testing may be necessary for epidemiological  and / or clinical management purposes  to differentiate between   SARS-CoV-2 and other Sarbecovirus currently known to infect humans.  If clinically indicated additional testing with an alternate test  methodology [phone]) is advised. The SARS-CoV-2 RNA is generally  detectable in upper and lower respiratory sp ecimens during the acute  phase of infection. The expected result is Negative. Fact Sheet for Patients:  StrictlyIdeas.no Fact Sheet for Healthcare Providers: BankingDealers.co.za This test is not yet approved or cleared by the [place] FDA and has been authorized for detection and/or diagnosis  of SARS-CoV-2 by FDA under an Emergency Use Authorization (EUA).  This EUA will remain in effect (meaning this test can be used) for the duration of the COVID-19 declaration under Section 564(b)(1) of the Act, 21 U.S.C. section 360bbb-3(b)(1), unless the authorization is terminated or revoked sooner. Performed at [facility], [place] [address], [place],  [address]    Dg Chest 1 View  Result Date: 12/25/2018 CLINICAL DATA:  Fall EXAM: CHEST  1 VIEW COMPARISON:  06/05/2018 FINDINGS: Post sternotomy changes. Right-sided pacing device as before. Low lung volumes. No acute opacity or pleural effusion. No pneumothorax. Old right fifth through seventh rib fractures. IMPRESSION: No active disease. Electronically Signed   By: Donavan Foil M.D.   On: 12/25/2018 21:03   Ct Head Wo Contrast  Result Date: 12/25/2018 CLINICAL DATA:  Fall EXAM: CT HEAD WITHOUT CONTRAST TECHNIQUE: Contiguous axial images were obtained from the base of the skull through the vertex without intravenous contrast. COMPARISON:  CT brain 06/05/2018 FINDINGS: Brain: Mild motion degradation. No acute territorial infarction, hemorrhage or intracranial mass. Moderate to marked atrophy. Stable ventricle size. Mild small vessel ischemic changes of the white matter. Vascular: No hyperdense vessels. Vertebral and carotid  vascular calcification. Skull: No fracture Sinuses/Orbits: Mild mucosal thickening in the ethmoid and sphenoid sinuses. Other: None IMPRESSION: 1. No CT evidence for acute intracranial abnormality. 2. Atrophy and small vessel ischemic changes of the white matter. Electronically Signed   By: Donavan Foil M.D.   On: 12/25/2018 21:08   Dg Hips Bilat W Or Wo Pelvis 3-4 Views  Result Date: 12/25/2018 CLINICAL DATA:  Fall EXAM: DG HIP (WITH OR WITHOUT PELVIS) 3-4V BILAT COMPARISON:  None. FINDINGS: Vascular calcifications. Pubic symphysis and rami appear intact. Right hip shows no definitive fracture or dislocation. Suspected left intertrochanteric fracture. No left femoral head dislocation. IMPRESSION: 1. Acute left intertrochanteric fracture. 2. No acute osseous abnormality of the right hip Electronically Signed   By: Donavan Foil M.D.   On: 12/25/2018 21:02    Pending Labs Unresulted Labs (From admission, onward)   None      Vitals/Pain Today's Vitals   12/25/18 1904 12/25/18 1921 12/25/18 1922 12/25/18 2137  BP: 122/84 124/67  98/72  Pulse: 68 70  65  Resp: 16   (!) 22  Temp: 98.7 F (37.1 C)     SpO2: 98% 95%  97%  PainSc: 8   0-No pain     Isolation Precautions No active isolations  Medications Medications - No data to display  Mobility non-ambulatory

## 2018-12-25 NOTE — ED Notes (Signed)
Please make sure the patient will not need telemetry before calling report.  Thank you.

## 2018-12-25 NOTE — H&P (Signed)
History and Physical    [name] [name] OBS:[id number] DOB: 07/21/1925 DOA: 12/25/2018  PCP: [name], MD  Patient coming from: Home  I have personally briefly reviewed patient's old medical records in [place]  Chief Complaint: Left hip pain after a fall  HPI: [name] [name] is a 85 y.o. male with medical history significant for CAD status post CABG, CHB status post PPM, permanent atrial fibrillation not on anticoagulation due to history of GI bleed and falls, hypertension, hyperlipidemia, dementia, ascending aortic aneurysm, and abdominal aortic aneurysm who presents to the ED for evaluation of left hip pain after a fall.  Patient is unable to provide history due to dementia with memory deficits therefore entirety of history is obtained from wife at bedside, EDP, and chart review.  Wife states that patient and she were walking out of the house towards a car when he was turning around lost balance and fell onto his left side.  He had pain in his left hip after the fall.  He did not lose consciousness.  EMS were called and he was brought to the ED for further evaluation.  ED Course:  In the ED initial vitals showed BP 124/67, pulse 70, RR 16, temp 98.7 Fahrenheit, SPO2 95% on room air.  Labs are notable for WBC 9.8, hemoglobin 11.4, platelets 234,000, potassium 4.4, BUN 24, creatinine 1.17.  SARS-CoV-2 test was negative.  CT head without contrast was negative for acute intracranial abnormality, atrophy and small vessel ischemic changes of the white matter were noted.  Portable chest x-ray showed a right-sided pacemaker in place with prior sternotomy changes.  No focal consolidation, effusion, or edema was seen.  Bilateral hip x-ray showed acute left intertrochanteric fracture.  On-call orthopedist, Dr. Erlinda Hong, was consulted by EDP and recommended medical admission at [facility] for likely surgical intervention tomorrow.  The hospitalist service was consulted to admit.   Review of Systems:  Unable to obtain full review of systems due to dementia.   Past Medical History:  Diagnosis Date  . Atrial fibrillation, permanent   . CAD (coronary artery disease)   . Dementia ([place])   . Diverticulosis    diverticular bleed 2011  . High cholesterol   . HTN (hypertension)    has improved over time, no rx as of 2019  . Lower GI bleed     Past Surgical History:  Procedure Laterality Date  . angiolplasty    . APPENDECTOMY    . INSERT / REPLACE / [place]    . PILONIDAL CYST EXCISION    . rotator cuff surgery  1985  . TONSILLECTOMY    . triple heart bypass      Social History:  reports that he quit smoking about 44 years ago. He has never used smokeless tobacco. He reports previous alcohol use. He reports that he does not use drugs.  Allergies  Allergen Reactions  . Amiodarone Other (See Comments)    Had a severe lung infection in 2003  . Antihistamines, Diphenhydramine-Type Other (See Comments)    Jittery    Family History  Problem Relation Age of Onset  . Stroke Father   . Cervical cancer Other        siblings  . COPD Sister   . Dementia Brother      Prior to Admission medications   Medication Sig Start Date End Date Taking? Authorizing Provider  atorvastatin (LIPITOR) 40 MG tablet Take 40 mg by  mouth every evening.    Yes [provider]  Cholecalciferol (VITAMIN D) 2000 UNITS CAPS Take 2,000 Units by mouth daily.    Yes [provider]  donepezil (ARICEPT) 5 MG tablet Take 1 tablet (5 mg total) by mouth 2 (two) times daily. 07/15/17  Yes Dohmeier, Asencion Partridge, MD  finasteride (PROSCAR) 5 MG tablet Take 5 mg by mouth every evening.    Yes [provider]  Multiple Vitamin (MULTIVITAMIN WITH MINERALS) TABS tablet Take 1 tablet by mouth daily.   Yes [provider]  sertraline (ZOLOFT) 25 MG tablet Take 25 mg by mouth daily. 07/07/18  Yes [provider]    Physical Exam:  Vitals:   12/25/18 1859 12/25/18 1904 12/25/18 1921 12/25/18 2137  BP:  122/84 124/67 98/72  Pulse:  68 70 65  Resp:  16  (!) 22  Temp:  98.7 F (37.1 C)    SpO2: 98% 98% 95% 97%    Constitutional: Elderly man resting supine in bed, has intermittent significant left hip pain when he moves his legs, he has short-term memory deficits repeating the same questions over Eyes: PERRL, lids and conjunctivae normal ENMT: Mucous membranes are moist. Posterior pharynx clear of any exudate or lesions. Neck: normal, supple, no masses. Respiratory: clear to auscultation anteriorly, no wheezing, no crackles. Normal respiratory effort. No accessory muscle use.  Cardiovascular: Regular rate and rhythm, soft systolic murmur. No extremity edema. 2+ pedal pulses.  PPM in place. Abdomen: no tenderness, no masses palpated. No hepatosplenomegaly. Bowel sounds positive.  Musculoskeletal: Pain at left hip with palpation and movement.  Left foot is everted.  Can wiggle toes and dorsiflex/plantarflex bilaterally.  ROM diminished LLE due to hip pain/fracture. Skin: no rashes, lesions, ulcers. No induration Neurologic: CN 2-12 grossly intact. Sensation intact,Strength 5/5 in both upper extremities.  Strength diminished in both lower extremities due to pain from left hip fracture. Psychiatric: He is awake and alert but confused.  He repeats the same questions over and over and asked why his left hip is hurting when he moves it.   Labs on Admission: I have personally reviewed following labs and imaging studies  CBC: Recent Labs  Lab 12/25/18 2106  WBC 9.8  HGB 11.4*  HCT 36.6*  MCV 96.1  PLT 595   Basic Metabolic Panel: Recent Labs  Lab 12/25/18 2106  NA 138  K 4.4  CL 103  CO2 27  GLUCOSE 95  BUN 24*  CREATININE 1.17  CALCIUM 9.1   GFR: CrCl cannot be calculated (Unknown ideal weight.). Liver Function Tests: Recent Labs  Lab 12/25/18 2106  AST 29  ALT 17  ALKPHOS 99  BILITOT 0.2*  PROT  7.6  ALBUMIN 3.1*   No results for input(s): LIPASE, AMYLASE in the last 168 hours. No results for input(s): AMMONIA in the last 168 hours. Coagulation Profile: No results for input(s): INR, PROTIME in the last 168 hours. Cardiac Enzymes: No results for input(s): CKTOTAL, CKMB, CKMBINDEX, TROPONINI in the last 168 hours. BNP (last 3 results) No results for input(s): PROBNP in the last 8760 hours. HbA1C: No results for input(s): HGBA1C in the last 72 hours. CBG: No results for input(s): GLUCAP in the last 168 hours. Lipid Profile: No results for input(s): CHOL, HDL, LDLCALC, TRIG, CHOLHDL, LDLDIRECT in the last 72 hours. Thyroid Function Tests: No results for input(s): TSH, T4TOTAL, FREET4, T3FREE, THYROIDAB in the last 72 hours. Anemia Panel: No results for input(s): VITAMINB12, FOLATE, FERRITIN, TIBC, IRON, RETICCTPCT in  the last 72 hours. Urine analysis:    Component Value Date/Time   COLORURINE YELLOW 11/21/2009 [place] 11/21/2009 1617   LABSPEC 1.017 11/21/2009 1617   PHURINE 6.0 11/21/2009 1617   GLUCOSEU NEGATIVE 11/21/2009 1617   HGBUR SMALL (A) 11/21/2009 1617   BILIRUBINUR NEGATIVE 11/21/2009 1617   KETONESUR NEGATIVE 11/21/2009 1617   PROTEINUR NEGATIVE 11/21/2009 1617   UROBILINOGEN 0.2 11/21/2009 1617   NITRITE NEGATIVE 11/21/2009 1617   LEUKOCYTESUR NEGATIVE 11/21/2009 1617    Radiological Exams on Admission: Dg Chest 1 View  Result Date: 12/25/2018 CLINICAL DATA:  Fall EXAM: CHEST  1 VIEW COMPARISON:  06/05/2018 FINDINGS: Post sternotomy changes. Right-sided pacing device as before. Low lung volumes. No acute opacity or pleural effusion. No pneumothorax. Old right fifth through seventh rib fractures. IMPRESSION: No active disease. Electronically Signed   By: Donavan Foil M.D.   On: 12/25/2018 21:03   Ct Head Wo Contrast  Result Date: 12/25/2018 CLINICAL DATA:  Fall EXAM: CT HEAD WITHOUT CONTRAST TECHNIQUE: Contiguous axial images were  obtained from the base of the skull through the vertex without intravenous contrast. COMPARISON:  CT brain 06/05/2018 FINDINGS: Brain: Mild motion degradation. No acute territorial infarction, hemorrhage or intracranial mass. Moderate to marked atrophy. Stable ventricle size. Mild small vessel ischemic changes of the white matter. Vascular: No hyperdense vessels. Vertebral and carotid vascular calcification. Skull: No fracture Sinuses/Orbits: Mild mucosal thickening in the ethmoid and sphenoid sinuses. Other: None IMPRESSION: 1. No CT evidence for acute intracranial abnormality. 2. Atrophy and small vessel ischemic changes of the white matter. Electronically Signed   By: Donavan Foil M.D.   On: 12/25/2018 21:08   Dg Hips Bilat W Or Wo Pelvis 3-4 Views  Result Date: 12/25/2018 CLINICAL DATA:  Fall EXAM: DG HIP (WITH OR WITHOUT PELVIS) 3-4V BILAT COMPARISON:  None. FINDINGS: Vascular calcifications. Pubic symphysis and rami appear intact. Right hip shows no definitive fracture or dislocation. Suspected left intertrochanteric fracture. No left femoral head dislocation. IMPRESSION: 1. Acute left intertrochanteric fracture. 2. No acute osseous abnormality of the right hip Electronically Signed   By: Donavan Foil M.D.   On: 12/25/2018 21:02    EKG: Independently reviewed.  V paced rhythm.  Previous EKG showed A. fib with LBBB.  Assessment/Plan Principal Problem:   Closed intertrochanteric fracture of hip, left, initial encounter [facility]) Active Problems:   Permanent atrial fibrillation (HCC)   CHB (complete heart block) ([place])   Hypercholesterolemia   HTN (hypertension)   Dementia ([place])   CAD (coronary artery disease)  [name] [name] is a 85 y.o. male with medical history significant for CAD status post CABG, CHB status post PPM, permanent atrial fibrillation not on anticoagulation due to history of GI bleed and falls, hypertension, hyperlipidemia, dementia, ascending aortic aneurysm, and abdominal  aortic aneurysm who is admitted for acute left intertrochanteric fracture after a fall.   Acute left intertrochanteric hip fracture: Appears to have occurred after a mechanical fall. He has a 6% preoperative 30-day risk of death, MI, or cardiac arrest based on the revised cardiac risk index. -Orthopedics consulted, plan for intervention in the OR on 12/26/2018 -Continue pain control with as needed oral Tylenol, oral Norco, and IV morphine -Keep n.p.o., continue gentle IV fluids overnight  CAD s/p CABG: Chronic and stable.  Patient denies any chest pain. -Continue atorvastatin  Permanent atrial fibrillation: Not on anticoagulation due to history of GI bleed and falls.  Rate is currently controlled and V paced.  CHB s/p PPM: V paced rhythm on admission.  Hypertension: No longer on antihypertensives, currently stable.  Hyperlipidemia: -Continue atorvastatin  Dementia with memory deficit and behavioral disturbance: Has significant memory deficit from dementia. -Continue donepezil and sertraline -Delirium precautions   DVT prophylaxis: SCDs Code Status: DNR, confirmed with wife and patient Family Communication: Discussed with wife at bedside Disposition Plan: Admit to [place] called: Orthopedics Admission status: Inpatient for management of acute left hip fracture.   Zada Finders MD [facility]  If 7PM-7AM, please contact night-coverage [email or web address]  12/25/2018, 11:23 PM

## 2018-12-25 NOTE — ED Notes (Signed)
Pt requesting something to help with pain. MD made aware

## 2018-12-25 NOTE — ED Notes (Signed)
Bed: [facility] Expected date:  Expected time:  Means of arrival:  Comments: 

## 2018-12-25 NOTE — ED Provider Notes (Signed)
[facility] DEPT Provider Note   CSN: [id number] Arrival date & time: 12/25/18  1848     History   Chief Complaint No chief complaint on file.   HPI [name] [name] is a 85 y.o. male.     Patient was walking on the sidewalk outside of his house when he lost his balance twisted and fell on his left side.  His wife was there there was no loss of consciousness.  But patient with complaint of left hip pain since that fall.  Patient has a history of atrial fibrillation and does have a pacemaker.  Not on blood thinners.  Has a history of dementia but patient is very alert and can converse here.  No other injuries are apparent.  Patient without a history of fevers or upper respiratory infection.  Patient's wife is with him.     Past Medical History:  Diagnosis Date  . Atrial fibrillation, permanent   . CAD (coronary artery disease)   . Dementia ([place])   . Diverticulosis    diverticular bleed 2011  . High cholesterol   . HTN (hypertension)    has improved over time, no rx as of 2019  . Lower GI bleed     Patient Active Problem List   Diagnosis Date Noted  . Closed displaced intertrochanteric fracture of left femur ([place]) 12/25/2018  . Pain due to onychomycosis of toenails of both feet 12/12/2018  . Acute blood loss anemia 06/08/2018  . GI bleed 06/05/2018  . HTN (hypertension)   . High cholesterol   . Diverticulosis   . Dementia ([place])   . CAD (coronary artery disease)   . Lower GI bleed   . AAA (abdominal aortic aneurysm) without rupture ([place]) 01/09/2018  . Ascending aortic aneurysm ([place]) 01/09/2018  . Heart block AV second degree 07/15/2017  . Dementia arising in the senium and presenium ([place]) 07/15/2017  . Status post three vessel coronary artery bypass 07/15/2017  . Left ventricular systolic dysfunction without heart failure 05/11/2017  . Hypercholesterolemia 05/11/2017  . Long term current use of anticoagulant 08/02/2016  . CHB (complete  heart block) ([place]) 06/07/2015  . Alzheimer's disease ([place]) 01/26/2015  . Dementia with behavioral disturbance ([place]) 07/28/2013  . Pacemaker 01/26/2013  . Permanent atrial fibrillation ([place]) 01/26/2013  . Second degree AV block, Mobitz type II 01/26/2013  . Coronary artery disease involving native coronary artery of native heart without angina pectoris 01/26/2013  . Dyspnea 01/26/2013    Past Surgical History:  Procedure Laterality Date  . angiolplasty    . APPENDECTOMY    . INSERT / REPLACE / [place]    . PILONIDAL CYST EXCISION    . rotator cuff surgery  1985  . TONSILLECTOMY    . triple heart bypass          Home Medications    Prior to Admission medications   Medication Sig Start Date End Date Taking? Authorizing Provider  atorvastatin (LIPITOR) 40 MG tablet Take 40 mg by mouth every evening.    Yes [provider]  Cholecalciferol (VITAMIN D) 2000 UNITS CAPS Take 2,000 Units by mouth daily.    Yes [provider]  donepezil (ARICEPT) 5 MG tablet Take 1 tablet (5 mg total) by mouth 2 (two) times daily. 07/15/17  Yes Dohmeier, Asencion Partridge, MD  finasteride (PROSCAR) 5 MG tablet Take 5 mg by mouth every evening.    Yes [provider]  Multiple Vitamin (  MULTIVITAMIN WITH MINERALS) TABS tablet Take 1 tablet by mouth daily.   Yes [provider]  sertraline (ZOLOFT) 25 MG tablet Take 25 mg by mouth daily. 07/07/18  Yes [provider]    Family History Family History  Problem Relation Age of Onset  . Stroke Father   . Cervical cancer Other        siblings  . COPD Sister   . Dementia Brother     Social History Social History   Tobacco Use  . Smoking status: Former Smoker    Quit date: 07/02/1974    Years since quitting: 44.5  . Smokeless tobacco: Never Used  Substance Use Topics  . Alcohol use: Not Currently    Comment: last used in dec 2019  . Drug use: No     Allergies   Amiodarone and  Antihistamines, diphenhydramine-type   Review of Systems Review of Systems  Constitutional: Negative for chills and fever.  HENT: Negative for rhinorrhea and sore throat.   Eyes: Negative for visual disturbance.  Respiratory: Negative for cough and shortness of breath.   Cardiovascular: Negative for chest pain and leg swelling.  Gastrointestinal: Negative for abdominal pain, diarrhea, nausea and vomiting.  Genitourinary: Negative for dysuria.  Musculoskeletal: Negative for back pain and neck pain.  Skin: Negative for rash.  Neurological: Negative for dizziness, syncope, light-headedness and headaches.  Hematological: Does not bruise/bleed easily.  Psychiatric/Behavioral: Negative for confusion.     Physical Exam Updated Vital Signs BP 98/72   Pulse 65   Temp 98.7 F (37.1 C)   Resp (!) 22   SpO2 97%   Physical Exam Vitals signs and nursing note reviewed.  Constitutional:      General: He is not in acute distress.    Appearance: Normal appearance. He is well-developed.  HENT:     Head: Normocephalic and atraumatic.  Eyes:     Extraocular Movements: Extraocular movements intact.     Conjunctiva/sclera: Conjunctivae normal.     Pupils: Pupils are equal, round, and reactive to light.  Neck:     Musculoskeletal: Neck supple.  Cardiovascular:     Rate and Rhythm: Normal rate and regular rhythm.     Heart sounds: No murmur.  Pulmonary:     Effort: Pulmonary effort is normal. No respiratory distress.     Breath sounds: Normal breath sounds.  Abdominal:     Palpations: Abdomen is soft.     Tenderness: There is no abdominal tenderness.  Musculoskeletal:     Comments: Patient with pain to palpation to the left hip area.  Some shortening of the left leg.  Patient with a 1+ dorsalis pedis pulse to both feet.  Skin:    General: Skin is warm and dry.  Neurological:     General: No focal deficit present.     Mental Status: He is alert. Mental status is at baseline.       ED Treatments / Results  Labs (all labs ordered are listed, but only abnormal results are displayed) Labs Reviewed  CBC - Abnormal; Notable for the following components:      Result Value   RBC 3.81 (*)    Hemoglobin 11.4 (*)    HCT 36.6 (*)    RDW 15.9 (*)    All other components within normal limits  COMPREHENSIVE METABOLIC PANEL - Abnormal; Notable for the following components:   BUN 24 (*)    Albumin 3.1 (*)    Total Bilirubin 0.2 (*)  GFR calc non Af Amer 54 (*)    All other components within normal limits  SARS CORONAVIRUS 2 (HOSPITAL ORDER, [facility])    EKG EKG Interpretation  Date/Time:  Thursday December 25 2018 21:07:57 EDT Ventricular Rate:  66 PR Interval:    QRS Duration: 160 QT Interval:  445 QTC Calculation: 467 R Axis:   -27 Text Interpretation:  Afib/flutter and ventricular-paced rhythm No further analysis attempted due to paced rhythm Confirmed by Fredia Sorrow [phone]) on 12/25/2018 9:11:38 PM   Radiology Dg Chest 1 View  Result Date: 12/25/2018 CLINICAL DATA:  Fall EXAM: CHEST  1 VIEW COMPARISON:  06/05/2018 FINDINGS: Post sternotomy changes. Right-sided pacing device as before. Low lung volumes. No acute opacity or pleural effusion. No pneumothorax. Old right fifth through seventh rib fractures. IMPRESSION: No active disease. Electronically Signed   By: Donavan Foil M.D.   On: 12/25/2018 21:03   Ct Head Wo Contrast  Result Date: 12/25/2018 CLINICAL DATA:  Fall EXAM: CT HEAD WITHOUT CONTRAST TECHNIQUE: Contiguous axial images were obtained from the base of the skull through the vertex without intravenous contrast. COMPARISON:  CT brain 06/05/2018 FINDINGS: Brain: Mild motion degradation. No acute territorial infarction, hemorrhage or intracranial mass. Moderate to marked atrophy. Stable ventricle size. Mild small vessel ischemic changes of the white matter. Vascular: No hyperdense vessels. Vertebral and carotid vascular  calcification. Skull: No fracture Sinuses/Orbits: Mild mucosal thickening in the ethmoid and sphenoid sinuses. Other: None IMPRESSION: 1. No CT evidence for acute intracranial abnormality. 2. Atrophy and small vessel ischemic changes of the white matter. Electronically Signed   By: Donavan Foil M.D.   On: 12/25/2018 21:08   Dg Hips Bilat W Or Wo Pelvis 3-4 Views  Result Date: 12/25/2018 CLINICAL DATA:  Fall EXAM: DG HIP (WITH OR WITHOUT PELVIS) 3-4V BILAT COMPARISON:  None. FINDINGS: Vascular calcifications. Pubic symphysis and rami appear intact. Right hip shows no definitive fracture or dislocation. Suspected left intertrochanteric fracture. No left femoral head dislocation. IMPRESSION: 1. Acute left intertrochanteric fracture. 2. No acute osseous abnormality of the right hip Electronically Signed   By: Donavan Foil M.D.   On: 12/25/2018 21:02    Procedures Procedures (including critical care time)  Medications Ordered in ED Medications - No data to display   Initial Impression / Assessment and Plan / ED Course  I have reviewed the triage vital signs and the nursing notes.  Pertinent labs & imaging results that were available during my care of the patient were reviewed by me and considered in my medical decision making (see chart for details).        Related injury to the left hip has a left intertrochanteric fracture.  Discussed with Dr. Erlinda Hong he will plan to operate on him tomorrow at [facility].  Discussed with hospitalist they will admit to [facility].  Patient's EKG consistent with paced rhythm.  An underlying rhythm of atrial fib and flutter which is baseline.  Chest x-ray without any acute abnormalities.  Basic labs without any significant abnormalities.  COVID testing is pending.  Final Clinical Impressions(s) / ED Diagnoses   Final diagnoses:  Closed left hip fracture, initial encounter [facility])    ED Discharge Orders    None       Fredia Sorrow, MD 12/25/18 2311

## 2018-12-25 NOTE — ED Notes (Signed)
Admitting MD at bedside.

## 2018-12-26 ENCOUNTER — Encounter (HOSPITAL_COMMUNITY): Payer: Self-pay | Admitting: Orthopedic Surgery

## 2018-12-26 ENCOUNTER — Inpatient Hospital Stay (HOSPITAL_COMMUNITY): Payer: Medicare Other | Admitting: Certified Registered Nurse Anesthetist

## 2018-12-26 ENCOUNTER — Inpatient Hospital Stay (HOSPITAL_COMMUNITY): Payer: Medicare Other

## 2018-12-26 ENCOUNTER — Encounter (HOSPITAL_COMMUNITY)
Admission: EM | Disposition: A | Discharge status: Skilled Nursing Facility | Payer: Self-pay | Source: Home / Self Care | Attending: Internal Medicine

## 2018-12-26 ENCOUNTER — Other Ambulatory Visit: Payer: Self-pay

## 2018-12-26 DIAGNOSIS — S72142A Displaced intertrochanteric fracture of left femur, initial encounter for closed fracture: Principal | ICD-10-CM

## 2018-12-26 HISTORY — PX: INTRAMEDULLARY (IM) NAIL INTERTROCHANTERIC: SHX5875

## 2018-12-26 LAB — CREATININE, SERUM
Creatinine, Ser: 1.28 mg/dL — ABNORMAL HIGH (ref 0.61–1.24)
GFR calc Af Amer: 56 mL/min — ABNORMAL LOW (ref >60)
GFR calc non Af Amer: 49 mL/min — ABNORMAL LOW (ref >60)

## 2018-12-26 LAB — CBC
HCT: 34.6 % — ABNORMAL LOW (ref 39.0–52.0)
Hemoglobin: 10.9 g/dL — ABNORMAL LOW (ref 13.0–17.0)
MCH: 29.5 pg (ref 26.0–34.0)
MCHC: 31.5 g/dL (ref 30.0–36.0)
MCV: 93.8 fL (ref 80.0–100.0)
Platelets: 218 10*3/uL (ref 150–400)
RBC: 3.69 MIL/uL — ABNORMAL LOW (ref 4.22–5.81)
RDW: 15.6 % — ABNORMAL HIGH (ref 11.5–15.5)
WBC: 12.5 10*3/uL — ABNORMAL HIGH (ref 4.0–10.5)
nRBC: 0 % (ref 0.0–0.2)

## 2018-12-26 LAB — SURGICAL PCR SCREEN
MRSA, PCR: NEGATIVE
Staphylococcus aureus: NEGATIVE

## 2018-12-26 SURGERY — FIXATION, FRACTURE, INTERTROCHANTERIC, WITH INTRAMEDULLARY ROD
Anesthesia: General | Site: Hip | Laterality: Left

## 2018-12-26 MED ORDER — ENOXAPARIN SODIUM 40 MG/0.4ML ~~LOC~~ SOLN: 40.0000 mg | Freq: Every day | SUBCUTANEOUS | 14 refills | Status: DC

## 2018-12-26 MED ORDER — DEXAMETHASONE SODIUM PHOSPHATE 10 MG/ML IJ SOLN
INTRAMUSCULAR | Status: AC
  Filled 2018-12-26: qty 2

## 2018-12-26 MED ORDER — PHENOL 1.4 % MT LIQD: 1.0000 | OROMUCOSAL | Status: DC | PRN

## 2018-12-26 MED ORDER — ROCURONIUM BROMIDE 10 MG/ML (PF) SYRINGE
PREFILLED_SYRINGE | INTRAVENOUS | Status: AC
  Filled 2018-12-26: qty 10

## 2018-12-26 MED ORDER — SODIUM CHLORIDE 0.9 % IV SOLN
INTRAVENOUS | Status: DC | PRN
  Administered 2018-12-26: 16:00:00 50 ug/min via INTRAVENOUS

## 2018-12-26 MED ORDER — ONDANSETRON HCL 4 MG/2ML IJ SOLN: 4.0000 mg | Freq: Four times a day (QID) | INTRAMUSCULAR | Status: DC | PRN

## 2018-12-26 MED ORDER — ACETAMINOPHEN 500 MG PO TABS
500.0000 mg | ORAL_TABLET | Freq: Four times a day (QID) | ORAL | Status: AC
  Administered 2018-12-26 – 2018-12-27 (×4): 500 mg via ORAL
  Filled 2018-12-26 (×4): qty 1

## 2018-12-26 MED ORDER — MAGNESIUM CITRATE PO SOLN: 1.0000 | Freq: Once | ORAL | Status: DC | PRN

## 2018-12-26 MED ORDER — SUCCINYLCHOLINE CHLORIDE 200 MG/10ML IV SOSY
PREFILLED_SYRINGE | INTRAVENOUS | Status: DC | PRN
  Administered 2018-12-26: 100 mg via INTRAVENOUS

## 2018-12-26 MED ORDER — PHENYLEPHRINE 40 MCG/ML (10ML) SYRINGE FOR IV PUSH (FOR BLOOD PRESSURE SUPPORT)
PREFILLED_SYRINGE | INTRAVENOUS | Status: DC | PRN
  Administered 2018-12-26 (×2): 80 ug via INTRAVENOUS
  Administered 2018-12-26: 20 ug via INTRAVENOUS
  Administered 2018-12-26: 80 ug via INTRAVENOUS

## 2018-12-26 MED ORDER — TRANEXAMIC ACID 1000 MG/10ML IV SOLN
2000.0000 mg | INTRAVENOUS | Status: DC
  Filled 2018-12-26: qty 20

## 2018-12-26 MED ORDER — SORBITOL 70 % SOLN: 30.0000 mL | Freq: Every day | Status: DC | PRN

## 2018-12-26 MED ORDER — 0.9 % SODIUM CHLORIDE (POUR BTL) OPTIME
TOPICAL | Status: DC | PRN
  Administered 2018-12-26: 1000 mL

## 2018-12-26 MED ORDER — METHOCARBAMOL 500 MG PO TABS: 500.0000 mg | ORAL_TABLET | Freq: Four times a day (QID) | ORAL | Status: DC | PRN

## 2018-12-26 MED ORDER — LIDOCAINE 2% (20 MG/ML) 5 ML SYRINGE
INTRAMUSCULAR | Status: DC | PRN
  Administered 2018-12-26: 20 mg via INTRAVENOUS

## 2018-12-26 MED ORDER — METHOCARBAMOL 1000 MG/10ML IJ SOLN
500.0000 mg | Freq: Four times a day (QID) | INTRAVENOUS | Status: DC | PRN
  Filled 2018-12-26: qty 5

## 2018-12-26 MED ORDER — DOCUSATE SODIUM 100 MG PO CAPS
100.0000 mg | ORAL_CAPSULE | Freq: Two times a day (BID) | ORAL | Status: DC
  Administered 2018-12-26 – 2018-12-28 (×4): 100 mg via ORAL
  Filled 2018-12-26 (×5): qty 1

## 2018-12-26 MED ORDER — LACTATED RINGERS IV SOLN
INTRAVENOUS | Status: DC
  Administered 2018-12-26: 15:00:00 via INTRAVENOUS

## 2018-12-26 MED ORDER — HYDROCODONE-ACETAMINOPHEN 5-325 MG PO TABS: 1.0000 | ORAL_TABLET | ORAL | Status: DC | PRN

## 2018-12-26 MED ORDER — ROCURONIUM BROMIDE 50 MG/5ML IV SOSY
PREFILLED_SYRINGE | INTRAVENOUS | Status: DC | PRN
  Administered 2018-12-26: 30 mg via INTRAVENOUS

## 2018-12-26 MED ORDER — DEXAMETHASONE SODIUM PHOSPHATE 10 MG/ML IJ SOLN
INTRAMUSCULAR | Status: DC | PRN
  Administered 2018-12-26: 10 mg via INTRAVENOUS

## 2018-12-26 MED ORDER — TRANEXAMIC ACID-NACL 1000-0.7 MG/100ML-% IV SOLN
1000.0000 mg | INTRAVENOUS | Status: AC
  Administered 2018-12-26: 1000 mg via INTRAVENOUS
  Filled 2018-12-26: qty 100

## 2018-12-26 MED ORDER — ONDANSETRON HCL 4 MG/2ML IJ SOLN
INTRAMUSCULAR | Status: AC
  Filled 2018-12-26: qty 2

## 2018-12-26 MED ORDER — LIDOCAINE 2% (20 MG/ML) 5 ML SYRINGE
INTRAMUSCULAR | Status: AC
  Filled 2018-12-26: qty 5

## 2018-12-26 MED ORDER — MORPHINE SULFATE (PF) 2 MG/ML IV SOLN: 1.0000 mg | INTRAVENOUS | Status: DC | PRN

## 2018-12-26 MED ORDER — HYDROCODONE-ACETAMINOPHEN 7.5-325 MG PO TABS: 1.0000 | ORAL_TABLET | ORAL | Status: DC | PRN

## 2018-12-26 MED ORDER — CEFAZOLIN SODIUM-DEXTROSE 2-4 GM/100ML-% IV SOLN
2.0000 g | INTRAVENOUS | Status: AC
  Administered 2018-12-26: 16:00:00 2 g via INTRAVENOUS
  Filled 2018-12-26: qty 100

## 2018-12-26 MED ORDER — MENTHOL 3 MG MT LOZG: 1.0000 | LOZENGE | OROMUCOSAL | Status: DC | PRN

## 2018-12-26 MED ORDER — PHENYLEPHRINE 40 MCG/ML (10ML) SYRINGE FOR IV PUSH (FOR BLOOD PRESSURE SUPPORT)
PREFILLED_SYRINGE | INTRAVENOUS | Status: AC
  Filled 2018-12-26: qty 10

## 2018-12-26 MED ORDER — EPHEDRINE 5 MG/ML INJ
INTRAVENOUS | Status: AC
  Filled 2018-12-26: qty 10

## 2018-12-26 MED ORDER — ACETAMINOPHEN 325 MG PO TABS
325.0000 mg | ORAL_TABLET | Freq: Four times a day (QID) | ORAL | Status: DC | PRN
  Administered 2018-12-27 – 2018-12-28 (×2): 650 mg via ORAL
  Filled 2018-12-26 (×2): qty 2

## 2018-12-26 MED ORDER — POVIDONE-IODINE 10 % EX SWAB
2.0000 "application " | Freq: Once | CUTANEOUS | Status: AC
  Administered 2018-12-26: 2 via TOPICAL

## 2018-12-26 MED ORDER — OXYCODONE-ACETAMINOPHEN 5-325 MG PO TABS: 1.0000 | ORAL_TABLET | Freq: Three times a day (TID) | ORAL | 0 refills | Status: DC | PRN

## 2018-12-26 MED ORDER — SODIUM CHLORIDE 0.9 % IV SOLN
INTRAVENOUS | Status: DC
  Administered 2018-12-26: 18:00:00 via INTRAVENOUS
  Administered 2018-12-27: 75 mL/h via INTRAVENOUS

## 2018-12-26 MED ORDER — PROPOFOL 10 MG/ML IV BOLUS
INTRAVENOUS | Status: DC | PRN
  Administered 2018-12-26: 80 mg via INTRAVENOUS

## 2018-12-26 MED ORDER — SUGAMMADEX SODIUM 200 MG/2ML IV SOLN
INTRAVENOUS | Status: DC | PRN
  Administered 2018-12-26: 200 mg via INTRAVENOUS

## 2018-12-26 MED ORDER — ONDANSETRON HCL 4 MG/2ML IJ SOLN
INTRAMUSCULAR | Status: DC | PRN
  Administered 2018-12-26: 4 mg via INTRAVENOUS

## 2018-12-26 MED ORDER — SUCCINYLCHOLINE CHLORIDE 200 MG/10ML IV SOSY
PREFILLED_SYRINGE | INTRAVENOUS | Status: AC
  Filled 2018-12-26: qty 10

## 2018-12-26 MED ORDER — ONDANSETRON HCL 4 MG PO TABS: 4.0000 mg | ORAL_TABLET | Freq: Four times a day (QID) | ORAL | Status: DC | PRN

## 2018-12-26 MED ORDER — MORPHINE SULFATE (PF) 2 MG/ML IV SOLN: 0.5000 mg | INTRAVENOUS | Status: DC | PRN

## 2018-12-26 MED ORDER — ENOXAPARIN SODIUM 40 MG/0.4ML ~~LOC~~ SOLN
40.0000 mg | SUBCUTANEOUS | Status: DC
  Administered 2018-12-27 – 2018-12-29 (×3): 40 mg via SUBCUTANEOUS
  Filled 2018-12-26 (×3): qty 0.4

## 2018-12-26 MED ORDER — ALUM & MAG HYDROXIDE-SIMETH 200-200-20 MG/5ML PO SUSP: 30.0000 mL | ORAL | Status: DC | PRN

## 2018-12-26 MED ORDER — FENTANYL CITRATE (PF) 250 MCG/5ML IJ SOLN
INTRAMUSCULAR | Status: AC
  Filled 2018-12-26: qty 5

## 2018-12-26 MED ORDER — FENTANYL CITRATE (PF) 250 MCG/5ML IJ SOLN
INTRAMUSCULAR | Status: DC | PRN
  Administered 2018-12-26: 25 ug via INTRAVENOUS
  Administered 2018-12-26: 50 ug via INTRAVENOUS

## 2018-12-26 MED ORDER — CEFAZOLIN SODIUM-DEXTROSE 2-4 GM/100ML-% IV SOLN
2.0000 g | Freq: Four times a day (QID) | INTRAVENOUS | Status: AC
  Administered 2018-12-26 – 2018-12-27 (×3): 2 g via INTRAVENOUS
  Filled 2018-12-26 (×3): qty 100

## 2018-12-26 MED ORDER — POLYETHYLENE GLYCOL 3350 17 G PO PACK: 17.0000 g | PACK | Freq: Every day | ORAL | Status: DC | PRN

## 2018-12-26 MED ORDER — PROPOFOL 10 MG/ML IV BOLUS
INTRAVENOUS | Status: AC
  Filled 2018-12-26: qty 20

## 2018-12-26 SURGICAL SUPPLY — 44 items
BIT DRILL SHORT 4.0 (BIT) IMPLANT
BNDG COHESIVE 4X5 TAN NS LF (GAUZE/BANDAGES/DRESSINGS) ×3 IMPLANT
BNDG COHESIVE 6X5 TAN STRL LF (GAUZE/BANDAGES/DRESSINGS) IMPLANT
BNDG GAUZE ELAST 4 BULKY (GAUZE/BANDAGES/DRESSINGS) ×3 IMPLANT
COVER PERINEAL POST (MISCELLANEOUS) ×3 IMPLANT
COVER SURGICAL LIGHT HANDLE (MISCELLANEOUS) ×3 IMPLANT
COVER WAND RF STERILE (DRAPES) ×3 IMPLANT
DRAPE C-ARMOR (DRAPES) ×2 IMPLANT
DRAPE STERI IOBAN 125X83 (DRAPES) ×3 IMPLANT
DRILL BIT SHORT 4.0 (BIT) ×3
DRSG MEPILEX BORDER 4X4 (GAUZE/BANDAGES/DRESSINGS) ×7 IMPLANT
DRSG MEPILEX BORDER 4X8 (GAUZE/BANDAGES/DRESSINGS) ×3 IMPLANT
DRSG PAD ABDOMINAL 8X10 ST (GAUZE/BANDAGES/DRESSINGS) ×6 IMPLANT
DURAPREP 26ML APPLICATOR (WOUND CARE) ×3 IMPLANT
ELECT REM PT RETURN 9FT ADLT (ELECTROSURGICAL) ×3
ELECTRODE REM PT RTRN 9FT ADLT (ELECTROSURGICAL) ×1 IMPLANT
GLOVE BIOGEL PI IND STRL 7.0 (GLOVE) ×1 IMPLANT
GLOVE BIOGEL PI INDICATOR 7.0 (GLOVE) ×2
GLOVE ECLIPSE 7.0 STRL STRAW (GLOVE) ×3 IMPLANT
GLOVE SKINSENSE NS SZ7.5 (GLOVE) ×4
GLOVE SKINSENSE STRL SZ7.5 (GLOVE) ×2 IMPLANT
GOWN STRL REIN XL XLG (GOWN DISPOSABLE) ×3 IMPLANT
GUIDE PIN 3.2X343 (PIN) ×2
GUIDE PIN 3.2X343MM (PIN) ×6
KIT BASIN OR (CUSTOM PROCEDURE TRAY) ×3 IMPLANT
KIT TURNOVER KIT B (KITS) ×3 IMPLANT
MANIFOLD NEPTUNE II (INSTRUMENTS) ×3 IMPLANT
NAIL TRIGEN 10MMX40CM-125 LEFT (Nail) ×2 IMPLANT
NS IRRIG 1000ML POUR BTL (IV SOLUTION) ×3 IMPLANT
PACK GENERAL/GYN (CUSTOM PROCEDURE TRAY) ×3 IMPLANT
PAD ARMBOARD 7.5X6 YLW CONV (MISCELLANEOUS) ×8 IMPLANT
PAD CAST 4YDX4 CTTN HI CHSV (CAST SUPPLIES) ×2 IMPLANT
PADDING CAST COTTON 4X4 STRL (CAST SUPPLIES) ×6
PIN GUIDE 3.2X343MM (PIN) IMPLANT
SCREW LAG COMPR KIT 95/90 (Screw) ×2 IMPLANT
SCREW TRIGEN LOW PROF 5.0X47.5 (Screw) ×2 IMPLANT
STAPLER VISISTAT 35W (STAPLE) ×3 IMPLANT
SUT VIC AB 0 CT1 27 (SUTURE) ×3
SUT VIC AB 0 CT1 27XBRD ANBCTR (SUTURE) ×1 IMPLANT
SUT VIC AB 2-0 CT1 27 (SUTURE) ×6
SUT VIC AB 2-0 CT1 TAPERPNT 27 (SUTURE) ×1 IMPLANT
TOWEL GREEN STERILE FF (TOWEL DISPOSABLE) ×3 IMPLANT
TOWEL OR 17X26 10 PK STRL BLUE (TOWEL DISPOSABLE) ×3 IMPLANT
WATER STERILE IRR 1000ML POUR (IV SOLUTION) ×3 IMPLANT

## 2018-12-26 NOTE — ED Notes (Signed)
[facility] at bedside 

## 2018-12-26 NOTE — Anesthesia Postprocedure Evaluation (Signed)
Anesthesia Post Note  Patient: [name] [name]  Procedure(s) Performed: INTRAMEDULLARY (IM) NAIL INTERTROCHANTRIC (Left Hip)     Patient location during evaluation: PACU Anesthesia Type: General Level of consciousness: awake and alert, patient cooperative and oriented Pain management: pain level controlled Vital Signs Assessment: post-procedure vital signs reviewed and stable Respiratory status: spontaneous breathing, nonlabored ventilation and respiratory function stable Cardiovascular status: blood pressure returned to baseline and stable Postop Assessment: no apparent nausea or vomiting Anesthetic complications: no    Last Vitals:  Vitals:   12/26/18 1741 12/26/18 1745  BP: 133/71 (!) 149/63  Pulse: 74 66  Resp: 17 19  Temp:    SpO2: 97% 96%    Last Pain:  Vitals:   12/26/18 0748  TempSrc: Oral  PainSc:                  [name],E. Janasia Coverdale

## 2018-12-26 NOTE — Transfer of Care (Signed)
Immediate Anesthesia Transfer of Care Note  Patient: [name] [name]  Procedure(s) Performed: INTRAMEDULLARY (IM) NAIL INTERTROCHANTRIC (Left Hip)  Patient Location: PACU  Anesthesia Type:General  Level of Consciousness: drowsy, pateint uncooperative and confused  Airway & Oxygen Therapy: Patient Spontanous Breathing and Patient connected to face mask oxygen  Post-op Assessment: Report given to RN and Post -op Vital signs reviewed and stable  Post vital signs: Reviewed and stable  Last Vitals:  Vitals Value Taken Time  BP    Temp    Pulse    Resp    SpO2      Last Pain:  Vitals:   12/26/18 0748  TempSrc: Oral  PainSc:          Complications: No apparent anesthesia complications

## 2018-12-26 NOTE — Progress Notes (Signed)
PROGRESS NOTE    [name] [name]  SNK:[id number] DOB: 23-Jul-1925 DOA: 12/25/2018 PCP: [name], MD  Outpatient Specialists:   Brief Narrative:  Patient is a 85 year old Caucasian male, demented, with past medical history significant for coronary artery disease, atrial fibrillation that is permanent, diverticulosis, lower GI bleed, hyperlipidemia and hypertension.     Patient's wife and patient were walking outside the house when he lost his balance and fell onto his left side leading to left hip fracture.  Surgery is planned for later today.  Patient is unable to give any significant history due to dementing illness.  Assessment & Plan:   Principal Problem:   Closed intertrochanteric fracture of hip, left, initial encounter [facility]) Active Problems:   Permanent atrial fibrillation (HCC)   CHB (complete heart block) (HCC)   Hypercholesterolemia   HTN (hypertension)   Dementia (HCC)   CAD (coronary artery disease)  Acute left intertrochanteric hip fracture: This following a mechanical fall.   Orthopedic surgery is planned later today.   Orthopedic input is highly appreciated.   Continue to optimize pain control.   Postop management as per orthopedic team.   We will expect significantly worsening of confusion/agitation postop, considering dementing illness.    CAD s/p CABG: Chronic and stable.    Permanent atrial fibrillation: Not on anticoagulation due to history of GI bleed and falls.   Rate is controlled.   Continue to monitor closely.    Complete heart block: Status post pacemaker placement.    Hypertension: Continue to monitor closely.    Hyperlipidemia: -Continue atorvastatin  Dementia with behavioral disturbance: Expect challenging.  Postop.    DVT prophylaxis: SCDs Code Status: DNR, confirmed with wife and patient Family Communication:  Disposition Plan:  This will depend on hospital course Consults called: Orthopedics   Procedures:   Awaiting  orthopedic surgery  Antimicrobials:   None   Subjective: No history from patient  Objective: Vitals:   12/26/18 0148 12/26/18 0200 12/26/18 0256 12/26/18 0748  BP:  121/61 (!) 143/75 116/67  Pulse: 70  63 69  Resp:  (!) 21 16 18   Temp:   98.3 F (36.8 C) 98.3 F (36.8 C)  TempSrc:   Oral Oral  SpO2: 98%  97% 94%  Weight:   63.7 kg   Height:   6\' 3"  (1.905 m)     Intake/Output Summary (Last 24 hours) at 12/26/2018 1416 Last data filed at 12/26/2018 1100 Gross per 24 hour  Intake 0 ml  Output 400 ml  Net -400 ml   Filed Weights   12/26/18 0256  Weight: 63.7 kg    Examination:  General exam: Not in any significant distress Respiratory system: Clear to auscultation.  Cardiovascular system: S1 & S2. No pedal edema. Gastrointestinal system: Abdomen is nondistended, soft and nontender. No organomegaly or masses felt. Normal bowel sounds heard. Central nervous system: Seems to move all extremities.  Awake and alert.   Extremities: No leg edema.  Data Reviewed: I have personally reviewed following labs and imaging studies  CBC: Recent Labs  Lab 12/25/18 2106  WBC 9.8  HGB 11.4*  HCT 36.6*  MCV 96.1  PLT 937   Basic Metabolic Panel: Recent Labs  Lab 12/25/18 2106  NA 138  K 4.4  CL 103  CO2 27  GLUCOSE 95  BUN 24*  CREATININE 1.17  CALCIUM 9.1   GFR: Estimated Creatinine Clearance: 37.1 mL/min (by C-G formula based on SCr of 1.17 mg/dL). Liver Function Tests: Recent Labs  Lab 12/25/18 2106  AST 29  ALT 17  ALKPHOS 99  BILITOT 0.2*  PROT 7.6  ALBUMIN 3.1*   No results for input(s): LIPASE, AMYLASE in the last 168 hours. No results for input(s): AMMONIA in the last 168 hours. Coagulation Profile: No results for input(s): INR, PROTIME in the last 168 hours. Cardiac Enzymes: No results for input(s): CKTOTAL, CKMB, CKMBINDEX, TROPONINI in the last 168 hours. BNP (last 3 results) No results for input(s): PROBNP in the last 8760 hours. HbA1C:  No results for input(s): HGBA1C in the last 72 hours. CBG: No results for input(s): GLUCAP in the last 168 hours. Lipid Profile: No results for input(s): CHOL, HDL, LDLCALC, TRIG, CHOLHDL, LDLDIRECT in the last 72 hours. Thyroid Function Tests: No results for input(s): TSH, T4TOTAL, FREET4, T3FREE, THYROIDAB in the last 72 hours. Anemia Panel: No results for input(s): VITAMINB12, FOLATE, FERRITIN, TIBC, IRON, RETICCTPCT in the last 72 hours. Urine analysis:    Component Value Date/Time   COLORURINE YELLOW 11/21/2009 1617   APPEARANCEUR CLEAR 11/21/2009 1617   LABSPEC 1.017 11/21/2009 1617   PHURINE 6.0 11/21/2009 1617   GLUCOSEU NEGATIVE 11/21/2009 1617   HGBUR SMALL (A) 11/21/2009 1617   BILIRUBINUR NEGATIVE 11/21/2009 1617   KETONESUR NEGATIVE 11/21/2009 1617   PROTEINUR NEGATIVE 11/21/2009 1617   UROBILINOGEN 0.2 11/21/2009 1617   NITRITE NEGATIVE 11/21/2009 1617   LEUKOCYTESUR NEGATIVE 11/21/2009 1617   Sepsis Labs: @LABRCNTIP (procalcitonin:4,lacticidven:4)  ) Recent Results (from the past 240 hour(s))  SARS Coronavirus 2 (CEPHEID - Performed in [place] hospital lab), Hosp Order     Status: None   Collection Time: 12/25/18  9:47 PM   Specimen: Nasopharyngeal Swab  Result Value Ref Range Status   SARS Coronavirus 2 NEGATIVE NEGATIVE Final    Comment: (NOTE) If result is NEGATIVE SARS-CoV-2 target nucleic acids are NOT DETECTED. The SARS-CoV-2 RNA is generally detectable in upper and lower  respiratory specimens during the acute phase of infection. The lowest  concentration of SARS-CoV-2 viral copies this assay can detect is 250  copies / mL. A negative result does not preclude SARS-CoV-2 infection  and should not be used as the sole basis for treatment or other  patient management decisions.  A negative result may occur with  improper specimen collection / handling, submission of specimen other  than nasopharyngeal swab, presence of viral mutation(s) within the   areas targeted by this assay, and inadequate number of viral copies  (<250 copies / mL). A negative result must be combined with clinical  observations, patient history, and epidemiological information. If result is POSITIVE SARS-CoV-2 target nucleic acids are DETECTED. The SARS-CoV-2 RNA is generally detectable in upper and lower  respiratory specimens dur ing the acute phase of infection.  Positive  results are indicative of active infection with SARS-CoV-2.  Clinical  correlation with patient history and other diagnostic information is  necessary to determine patient infection status.  Positive results do  not rule out bacterial infection or co-infection with other viruses. If result is PRESUMPTIVE POSTIVE SARS-CoV-2 nucleic acids MAY BE PRESENT.   A presumptive positive result was obtained on the submitted specimen  and confirmed on repeat testing.  While 2019 novel coronavirus  (SARS-CoV-2) nucleic acids may be present in the submitted sample  additional confirmatory testing may be necessary for epidemiological  and / or clinical management purposes  to differentiate between  SARS-CoV-2 and other Sarbecovirus currently known to infect humans.  If clinically indicated additional testing with an alternate  test  methodology [phone]) is advised. The SARS-CoV-2 RNA is generally  detectable in upper and lower respiratory sp ecimens during the acute  phase of infection. The expected result is Negative. Fact Sheet for Patients:  StrictlyIdeas.no Fact Sheet for Healthcare Providers: BankingDealers.co.za This test is not yet approved or cleared by the [place] FDA and has been authorized for detection and/or diagnosis of SARS-CoV-2 by FDA under an Emergency Use Authorization (EUA).  This EUA will remain in effect (meaning this test can be used) for the duration of the COVID-19 declaration under Section 564(b)(1) of the Act, 21 U.S.C.  section 360bbb-3(b)(1), unless the authorization is terminated or revoked sooner. Performed at [facility], [place] [address] [place], [place] [address]   Surgical PCR screen     Status: None   Collection Time: 12/26/18  3:43 AM   Specimen: Nasal Mucosa; Nasal Swab  Result Value Ref Range Status   MRSA, PCR NEGATIVE NEGATIVE Final   Staphylococcus aureus NEGATIVE NEGATIVE Final    Comment: (NOTE) The Xpert SA Assay (FDA approved for NASAL specimens in patients 27 years of age and older), is one component of a comprehensive surveillance program. It is not intended to diagnose infection nor to guide or monitor treatment. Performed at [facility], [place] [address], [place], [place] [address]          Radiology Studies: Dg Chest 1 View  Result Date: 12/25/2018 CLINICAL DATA:  Fall EXAM: CHEST  1 VIEW COMPARISON:  06/05/2018 FINDINGS: Post sternotomy changes. Right-sided pacing device as before. Low lung volumes. No acute opacity or pleural effusion. No pneumothorax. Old right fifth through seventh rib fractures. IMPRESSION: No active disease. Electronically Signed   By: Donavan Foil M.D.   On: 12/25/2018 21:03   Ct Head Wo Contrast  Result Date: 12/25/2018 CLINICAL DATA:  Fall EXAM: CT HEAD WITHOUT CONTRAST TECHNIQUE: Contiguous axial images were obtained from the base of the skull through the vertex without intravenous contrast. COMPARISON:  CT brain 06/05/2018 FINDINGS: Brain: Mild motion degradation. No acute territorial infarction, hemorrhage or intracranial mass. Moderate to marked atrophy. Stable ventricle size. Mild small vessel ischemic changes of the white matter. Vascular: No hyperdense vessels. Vertebral and carotid vascular calcification. Skull: No fracture Sinuses/Orbits: Mild mucosal thickening in the ethmoid and sphenoid sinuses. Other: None IMPRESSION: 1. No CT evidence for acute intracranial abnormality. 2. Atrophy and small vessel ischemic  changes of the white matter. Electronically Signed   By: Donavan Foil M.D.   On: 12/25/2018 21:08   Dg Hips Bilat W Or Wo Pelvis 3-4 Views  Result Date: 12/25/2018 CLINICAL DATA:  Fall EXAM: DG HIP (WITH OR WITHOUT PELVIS) 3-4V BILAT COMPARISON:  None. FINDINGS: Vascular calcifications. Pubic symphysis and rami appear intact. Right hip shows no definitive fracture or dislocation. Suspected left intertrochanteric fracture. No left femoral head dislocation. IMPRESSION: 1. Acute left intertrochanteric fracture. 2. No acute osseous abnormality of the right hip Electronically Signed   By: Donavan Foil M.D.   On: 12/25/2018 21:02        Scheduled Meds: . atorvastatin  40 mg Oral QPM  . cholecalciferol  2,000 Units Oral Daily  . donepezil  5 mg Oral BID  . finasteride  5 mg Oral QPM  . sertraline  25 mg Oral Daily  . tranexamic acid (CYKLOKAPRON) topical -INTRAOP  2,000 mg Topical To OR   Continuous Infusions: .  ceFAZolin (ANCEF) IV    . tranexamic acid  LOS: 1 day    Time spent: 35 minutes.    Dana Allan, MD  [facility] Pager #: [phone] 7PM-7AM contact night coverage as above

## 2018-12-26 NOTE — ED Notes (Signed)
[facility] contacted and paperwork printed  

## 2018-12-26 NOTE — ED Notes (Signed)
Report given to [facility] 

## 2018-12-26 NOTE — Anesthesia Preprocedure Evaluation (Addendum)
Anesthesia Evaluation  Patient identified by MRN, date of birth, ID band Patient awake    Reviewed: Allergy & Precautions, NPO status , Patient's Chart, lab work & pertinent test results  Airway Mallampati: II  TM Distance: >3 FB Neck ROM: Limited    Dental no notable dental hx.    Pulmonary neg pulmonary ROS, former smoker,    Pulmonary exam normal breath sounds clear to auscultation       Cardiovascular hypertension, + CAD, + CABG and + Peripheral Vascular Disease  Normal cardiovascular exam+ dysrhythmias Atrial Fibrillation + pacemaker  Rhythm:Regular Rate:Normal  EF 50% 12/2017   Neuro/Psych Dementia negative neurological ROS     GI/Hepatic negative GI ROS, Neg liver ROS,   Endo/Other  negative endocrine ROS  Renal/GU negative Renal ROS  negative genitourinary   Musculoskeletal negative musculoskeletal ROS (+)   Abdominal   Peds negative pediatric ROS (+)  Hematology negative hematology ROS (+)   Anesthesia Other Findings   Reproductive/Obstetrics negative OB ROS                            Anesthesia Physical Anesthesia Plan  ASA: III  Anesthesia Plan: General   Post-op Pain Management:    Induction: Intravenous  PONV Risk Score and Plan: 2 and Ondansetron, Dexamethasone and Treatment may vary due to age or medical condition  Airway Management Planned: Oral ETT  Additional Equipment:   Intra-op Plan:   Post-operative Plan: Extubation in OR  Informed Consent: I have reviewed the patients History and Physical, chart, labs and discussed the procedure including the risks, benefits and alternatives for the proposed anesthesia with the patient or authorized representative who has indicated his/her understanding and acceptance.     Dental advisory given  Plan Discussed with: CRNA and Surgeon  Anesthesia Plan Comments:         Anesthesia Quick Evaluation

## 2018-12-26 NOTE — Discharge Instructions (Signed)
° ° °  1. Change dressings as needed °2. May shower but keep incisions covered and dry °3. Take lovenox to prevent blood clots °4. Take stool softeners as needed °5. Take pain meds as needed ° °

## 2018-12-26 NOTE — Progress Notes (Signed)
Patient refused insertion of foley catheter for surgical procedure per order. Stated, "I want to wait until later closer to surgery". 2nd Nurse Margreta Journey at bedside and wife [name] at bedside.

## 2018-12-26 NOTE — Anesthesia Procedure Notes (Signed)
Procedure Name: Intubation Date/Time: 12/26/2018 3:55 PM Performed by: Shirlyn Goltz, CRNA Pre-anesthesia Checklist: Patient identified, Emergency Drugs available, Suction available and Patient being monitored Patient Re-evaluated:Patient Re-evaluated prior to induction Oxygen Delivery Method: Circle system utilized Preoxygenation: Pre-oxygenation with 100% oxygen Induction Type: IV induction Ventilation: Mask ventilation without difficulty Laryngoscope Size: Mac and 4 Grade View: Grade I Tube type: Oral Tube size: 7.5 mm Number of attempts: 1 Airway Equipment and Method: Stylet Placement Confirmation: ETT inserted through vocal cords under direct vision,  positive ETCO2 and breath sounds checked- equal and bilateral Secured at: 20 cm Tube secured with: Tape Dental Injury: Teeth and Oropharynx as per pre-operative assessment

## 2018-12-26 NOTE — Op Note (Signed)
° °  Date of Surgery: 12/26/2018  INDICATIONS: Mr. Strollo is a 85 y.o.-year-old male who sustained a left hip fracture. The risks and benefits of the procedure discussed with the family prior to the procedure and all questions were answered; consent was obtained.  PREOPERATIVE DIAGNOSIS: left intertrochanteric hip fracture   POSTOPERATIVE DIAGNOSIS: Same   PROCEDURE: Treatment of intertrochanteric fracture with intramedullary implant. CPT [phone]   SURGEON: N. Eduard Roux, M.D.   ASSIST: Ciro Backer [place], [place]; necessary for the timely completion of procedure and due to complexity of procedure.  ANESTHESIA: general   IV FLUIDS AND URINE: See anesthesia record   ESTIMATED BLOOD LOSS: 200 cc  IMPLANTS: [employer] InterTAN 10 x 40, 95/90 lag screws  DRAINS: None.   COMPLICATIONS: see description of procedure.   DESCRIPTION OF PROCEDURE: The patient was brought to the operating room and placed supine on the operating table. The patient's leg had been signed prior to the procedure. The patient had the anesthesia placed by the anesthesiologist. The prep verification and incision time-outs were performed to confirm that this was the correct patient, site, side and location. The patient had an SCD on the opposite lower extremity. The patient did receive antibiotics prior to the incision and was re-dosed during the procedure as needed at indicated intervals. The patient was positioned on the fracture table with the table in traction and internal rotation to reduce the hip. The well leg was placed in a scissor position and all bony prominences were well-padded. The patient had the lower extremity prepped and draped in the standard surgical fashion. The incision was made 4 finger breadths superior to the greater trochanter. A guide pin was inserted into the tip of the greater trochanter under fluoroscopic guidance. An opening reamer was used to gain access to the femoral canal. The nail length  was measured and inserted down the femoral canal to its proper depth. The appropriate version of insertion for the lag screw was found under fluoroscopy. A pin was inserted up the femoral neck through the jig. Then, a second antirotation pin was inserted inferior to the first pin. The length of the lag screw was then measured. The lag screw was inserted as near to center-center in the head as possible. The antirotation pin was then taken out and an interdigitating compression screw was placed in its place. The leg was taken out of traction, then the interdigitating compression screw was used to compress across the fracture. Compression was visualized on serial xrays. A single distal interlocking screw was placed using the perfect circle technique.  The wound was copiously irrigated with saline and the subcutaneous layer closed with 2.0 vicryl and the skin was reapproximated with staples. The wounds were cleaned and dried a final time and a sterile dressing was placed. The hip was taken through a range of motion at the end of the case under fluoroscopic imaging to visualize the approach-withdraw phenomenon and confirm implant length in the head. The patient was then awakened from anesthesia and taken to the recovery room in stable condition. All counts were correct at the end of the case.   POSTOPERATIVE PLAN: The patient will be weight bearing as tolerated and will return in 2 weeks for staple removal and the patient will receive DVT prophylaxis based on other medications, activity level, and risk ratio of bleeding to thrombosis.   Azucena Cecil, MD [place] 4:52 PM

## 2018-12-26 NOTE — Plan of Care (Signed)
  Problem: Education: Goal: Knowledge of General Education information will improve Description: Including pain rating scale, medication(s)/side effects and non-pharmacologic comfort measures Outcome: Progressing   Problem: Pain Managment: Goal: General experience of comfort will improve Outcome: Progressing   Problem: Safety: Goal: Ability to remain free from injury will improve Outcome: Progressing   

## 2018-12-26 NOTE — Progress Notes (Signed)
Patient arrived to floor to room 5N17 @0230  via stretcher by [place] from [facility]. Pt alert and verbal. No s/s of respiratory distress noted.

## 2018-12-26 NOTE — Consult Note (Signed)
ORTHOPAEDIC CONSULTATION  REQUESTING PHYSICIAN: Bonnell Public, MD  Chief Complaint: Left intertroch fracture  HPI: [name] [name] is a 85 y.o. male who presents with left intertroch fracture s/p mechanical fall prior to arrival in the ED.  He and his wife were walking outside the house when he lost his balance and fell onto his left side.  The patient endorses severe pain in the left hip, that does not radiate, grinding in quality, worse with any movement, better with immobilization.  Denies LOC/fever/chills/nausea/vomiting.  Walks without assistive devices (walker, cane, wheelchair).  Lives with wife at home.  He is a Nutritional therapist.  Denies LOC, neck pain, abd pain.  Past Medical History:  Diagnosis Date  . Atrial fibrillation, permanent   . CAD (coronary artery disease)   . Dementia ([place])   . Diverticulosis    diverticular bleed 2011  . High cholesterol   . HTN (hypertension)    has improved over time, no rx as of 2019  . Lower GI bleed    Past Surgical History:  Procedure Laterality Date  . angiolplasty    . APPENDECTOMY    . INSERT / REPLACE / [place]    . PILONIDAL CYST EXCISION    . rotator cuff surgery  1985  . TONSILLECTOMY    . triple heart bypass     Social History   Socioeconomic History  . Marital status: Married    Spouse name: [name]  . Number of children: 6  . Years of education: college  . Highest education level: Not on file  Occupational History  . Occupation: retired  Scientific laboratory technician  . Financial resource strain: Not on file  . Food insecurity    Worry: Not on file    Inability: Not on file  . Transportation needs    Medical: Not on file    Non-medical: Not on file  Tobacco Use  . Smoking status: Former Smoker    Quit date: 07/02/1974    Years since quitting: 44.5  . Smokeless tobacco: Never Used  Substance and Sexual Activity  . Alcohol use: Not Currently    Comment: last used in dec 2019  . Drug  use: No  . Sexual activity: Not on file  Lifestyle  . Physical activity    Days per week: Not on file    Minutes per session: Not on file  . Stress: Not on file  Relationships  . Social Herbalist on phone: Not on file    Gets together: Not on file    Attends religious service: Not on file    Active member of club or organization: Not on file    Attends meetings of clubs or organizations: Not on file    Relationship status: Not on file  Other Topics Concern  . Not on file  Social History Narrative   Patient is married [name]) and lives at home with his wife.   Patient has a college education.   Patient is a retired Materials engineer.   Patient does not drink any caffeine.   Patient has six children.   Family History  Problem Relation Age of Onset  . Stroke Father   . Cervical cancer Other        siblings  . COPD Sister   . Dementia Brother    Allergies  Allergen Reactions  . Amiodarone Other (See Comments)    Had a severe  lung infection in 2003  . Antihistamines, Diphenhydramine-Type Other (See Comments)    Jittery   Prior to Admission medications   Medication Sig Start Date End Date Taking? Authorizing Provider  atorvastatin (LIPITOR) 40 MG tablet Take 40 mg by mouth every evening.    Yes [provider]  Cholecalciferol (VITAMIN D) 2000 UNITS CAPS Take 2,000 Units by mouth daily.    Yes [provider]  donepezil (ARICEPT) 5 MG tablet Take 1 tablet (5 mg total) by mouth 2 (two) times daily. 07/15/17  Yes Dohmeier, Asencion Partridge, MD  finasteride (PROSCAR) 5 MG tablet Take 5 mg by mouth every evening.    Yes [provider]  Multiple Vitamin (MULTIVITAMIN WITH MINERALS) TABS tablet Take 1 tablet by mouth daily.   Yes [provider]  sertraline (ZOLOFT) 25 MG tablet Take 25 mg by mouth daily. 07/07/18  Yes [provider]   Dg Chest 1 View  Result Date: 12/25/2018 CLINICAL DATA:  Fall EXAM: CHEST  1 VIEW  COMPARISON:  06/05/2018 FINDINGS: Post sternotomy changes. Right-sided pacing device as before. Low lung volumes. No acute opacity or pleural effusion. No pneumothorax. Old right fifth through seventh rib fractures. IMPRESSION: No active disease. Electronically Signed   By: Donavan Foil M.D.   On: 12/25/2018 21:03   Ct Head Wo Contrast  Result Date: 12/25/2018 CLINICAL DATA:  Fall EXAM: CT HEAD WITHOUT CONTRAST TECHNIQUE: Contiguous axial images were obtained from the base of the skull through the vertex without intravenous contrast. COMPARISON:  CT brain 06/05/2018 FINDINGS: Brain: Mild motion degradation. No acute territorial infarction, hemorrhage or intracranial mass. Moderate to marked atrophy. Stable ventricle size. Mild small vessel ischemic changes of the white matter. Vascular: No hyperdense vessels. Vertebral and carotid vascular calcification. Skull: No fracture Sinuses/Orbits: Mild mucosal thickening in the ethmoid and sphenoid sinuses. Other: None IMPRESSION: 1. No CT evidence for acute intracranial abnormality. 2. Atrophy and small vessel ischemic changes of the white matter. Electronically Signed   By: Donavan Foil M.D.   On: 12/25/2018 21:08   Dg Hips Bilat W Or Wo Pelvis 3-4 Views  Result Date: 12/25/2018 CLINICAL DATA:  Fall EXAM: DG HIP (WITH OR WITHOUT PELVIS) 3-4V BILAT COMPARISON:  None. FINDINGS: Vascular calcifications. Pubic symphysis and rami appear intact. Right hip shows no definitive fracture or dislocation. Suspected left intertrochanteric fracture. No left femoral head dislocation. IMPRESSION: 1. Acute left intertrochanteric fracture. 2. No acute osseous abnormality of the right hip Electronically Signed   By: Donavan Foil M.D.   On: 12/25/2018 21:02    All pertinent xrays, MRI, CT independently reviewed and interpreted  Positive ROS: All other systems have been reviewed and were otherwise negative with the exception of those mentioned in the HPI and as above.   Physical Exam: General: Alert, no acute distress Cardiovascular: No pedal edema Respiratory: No cyanosis, no use of accessory musculature GI: No organomegaly, abdomen is soft and non-tender Skin: No lesions in the area of chief complaint Neurologic: Sensation intact distally Psychiatric: Patient is competent for consent with normal mood and affect Lymphatic: No axillary or cervical lymphadenopathy  MUSCULOSKELETAL:  - severe pain with movement of the hip and extremity - skin intact - NVI distally - compartments soft  Assessment: Left intertrochanteric hip fracture  Plan: - surgical stabilization is recommended, patient and family are aware of r/b/a and wish to proceed, informed consent obtained - Medically optimized - surgery is planned for this afternoon  Thank you for the consult and the  opportunity to see Mr. [name] [name]. Eduard Roux, MD [place] 7:29 AM

## 2018-12-27 DIAGNOSIS — I10 Essential (primary) hypertension: Secondary | ICD-10-CM

## 2018-12-27 DIAGNOSIS — I4821 Permanent atrial fibrillation: Secondary | ICD-10-CM

## 2018-12-27 DIAGNOSIS — E7800 Pure hypercholesterolemia, unspecified: Secondary | ICD-10-CM

## 2018-12-27 DIAGNOSIS — F0390 Unspecified dementia without behavioral disturbance: Secondary | ICD-10-CM

## 2018-12-27 DIAGNOSIS — I2510 Atherosclerotic heart disease of native coronary artery without angina pectoris: Secondary | ICD-10-CM

## 2018-12-27 DIAGNOSIS — I442 Atrioventricular block, complete: Secondary | ICD-10-CM

## 2018-12-27 LAB — BASIC METABOLIC PANEL
Anion gap: 7 (ref 5–15)
BUN: 20 mg/dL (ref 8–23)
CO2: 26 mmol/L (ref 22–32)
Calcium: 8.5 mg/dL — ABNORMAL LOW (ref 8.9–10.3)
Chloride: 103 mmol/L (ref 98–111)
Creatinine, Ser: 1.17 mg/dL (ref 0.61–1.24)
GFR calc Af Amer: >60 mL/min (ref >60)
GFR calc non Af Amer: 54 mL/min — ABNORMAL LOW (ref >60)
Glucose, Bld: 148 mg/dL — ABNORMAL HIGH (ref 70–99)
Potassium: 4.4 mmol/L (ref 3.5–5.1)
Sodium: 136 mmol/L (ref 135–145)

## 2018-12-27 LAB — CBC
HCT: 31.2 % — ABNORMAL LOW (ref 39.0–52.0)
Hemoglobin: 9.9 g/dL — ABNORMAL LOW (ref 13.0–17.0)
MCH: 29.7 pg (ref 26.0–34.0)
MCHC: 31.7 g/dL (ref 30.0–36.0)
MCV: 93.7 fL (ref 80.0–100.0)
Platelets: 194 10*3/uL (ref 150–400)
RBC: 3.33 MIL/uL — ABNORMAL LOW (ref 4.22–5.81)
RDW: 15.7 % — ABNORMAL HIGH (ref 11.5–15.5)
WBC: 10.1 10*3/uL (ref 4.0–10.5)
nRBC: 0 % (ref 0.0–0.2)

## 2018-12-27 NOTE — Evaluation (Signed)
Physical Therapy Evaluation Patient Details Name: [name] [name] MRN: [id number] DOB: 10-21-1925 Today's Date: 12/27/2018   History of Present Illness  Pt is a 85 y.o. male admitted 12/25/18 after fall sustaining L intertrochanteric hip fx. Now s/p L femoral IM nail 12/26/18. PMH includes CAD, pacemaker, afib (not on anticoag due to h/o falls), HTN, dementia.    Clinical Impression  Pt presents with an overall decrease in functional mobility secondary to above. PTA, pt lives with wife and ambulatory without DME; pt indep with ADLs except son assists with bathing. Today, pt able to initiate transfer and gait training with RW and min-modA. Pt with baseline memory deficits, but motivated to participate and follows commands well. Feel pt would benefit from intensive CIR-level therapies to maximize functional mobility and independence prior to return home.   Spoke with wife [name]) who has been staying in patient's room to help reorient him to unfamiliar environment, she is in agreement with post-acute rehab and reports family can provide necessary assist upon return home.     Follow Up Recommendations CIR;Supervision/Assistance - 24 hour    Equipment Recommendations  (TBD)    Recommendations for Other Services       Precautions / Restrictions Precautions Precautions: Fall Restrictions Weight Bearing Restrictions: Yes LLE Weight Bearing: Weight bearing as tolerated      Mobility  Bed Mobility Overal bed mobility: Needs Assistance Bed Mobility: Supine to Sit     Supine to sit: Mod assist;HOB elevated     General bed mobility comments: ModA to assist LLE and hips to EOB, UE support for trunk elevation  Transfers Overall transfer level: Needs assistance Equipment used: Rolling walker (2 wheeled) Transfers: Sit to/from Stand Sit to Stand: Min assist         General transfer comment: MinA for trunk elevation and to steady RW as pt pulling up on device with BUEs. Poor  eccentric control into sitting due to painful LLE  Ambulation/Gait Ambulation/Gait assistance: Min guard Gait Distance (Feet): 25 Feet Assistive device: Rolling walker (2 wheeled) Gait Pattern/deviations: Step-to pattern;Decreased weight shift to left;Trunk flexed;Antalgic Gait velocity: Decreased Gait velocity interpretation: <1.31 ft/sec, indicative of household ambulator General Gait Details: Slow, antalgic steps with RW and close min guard for balance. Pt with poor short-term memory requiring frequent redirection to task  Stairs            Wheelchair Mobility    Modified Rankin (Stroke Patients Only)       Balance Overall balance assessment: Needs assistance   Sitting balance-Leahy Scale: Fair       Standing balance-Leahy Scale: Poor Standing balance comment: Reliant on UE support                             Pertinent Vitals/Pain Pain Assessment: Faces Faces Pain Scale: Hurts even more Pain Location: L hip Pain Descriptors / Indicators: Grimacing;Guarding Pain Intervention(s): Monitored during session    Home Living Family/patient expects to be discharged to:: Private residence Living Arrangements: Spouse/significant other Available Help at Discharge: Family;Available 24 hours/day Type of Home: House Home Access: Stairs to enter Entrance Stairs-Rails: None Entrance Stairs-Number of Steps: 2 Home Layout: Two level;1/2 bath on main level Home Equipment: Grab bars - tub/shower      Prior Function Level of Independence: Needs assistance   Gait / Transfers Assistance Needed: Ambulates without DME. Son present to supervise steps to get upstairs to shower 1x/wk  ADL's / [employer]  Needed: Wife reports pt indep with dressing and toileting; son assists with bathing. Wife does 3 of cooking/household tasks        Hand Dominance   Dominant Hand: Right    Extremity/Trunk Assessment   Upper Extremity Assessment Upper Extremity  Assessment: Generalized weakness    Lower Extremity Assessment Lower Extremity Assessment: Generalized weakness;LLE deficits/detail LLE Deficits / Details: s/p L femur IM nail; hip and knee < 3/5, limited by pain LLE Coordination: decreased gross motor;decreased fine motor       Communication   Communication: HOH  Cognition Arousal/Alertness: Awake/alert Behavior During Therapy: WFL for tasks assessed/performed Overall Cognitive Status: History of cognitive impairments - at baseline Area of Impairment: Orientation;Attention;Memory;Following commands;Safety/judgement;Awareness;Problem solving                 Orientation Level: Disoriented to;Time;Situation Current Attention Level: Sustained Memory: Decreased short-term memory Following Commands: Follows one step commands with increased time Safety/Judgement: Decreased awareness of deficits;Decreased awareness of safety Awareness: Intellectual Problem Solving: Requires verbal cues General Comments: Poor short-term memory, requiring frequent reorientation and redirection to task. Able to state wife's name. Very pleasant. Wife confirms pt near baseline cognition and gets more confused in unfamiliar environment      General Comments      Exercises     Assessment/Plan    PT Assessment Patient needs continued PT services  PT Problem List Decreased strength;Decreased activity tolerance;Decreased balance;Decreased range of motion;Decreased mobility;Decreased cognition;Decreased knowledge of use of DME;Decreased safety awareness;Decreased knowledge of precautions;Pain       PT Treatment Interventions DME instruction;Gait training;Stair training;Functional mobility training;Therapeutic activities;Therapeutic exercise;Balance training;Patient/family education    PT Goals (Current goals can be found in the Care Plan section)  Acute Rehab PT Goals Patient Stated Goal: Wife agreeable to rehab before pt returns home PT Goal  Formulation: With patient/family Time For Goal Achievement: 01/10/19 Potential to Achieve Goals: Good    Frequency Min 5X/week   Barriers to discharge        Co-evaluation PT/OT/SLP Co-Evaluation/Treatment: Yes Reason for Co-Treatment: Necessary to address cognition/behavior during functional activity;For patient/therapist safety;To address functional/ADL transfers PT goals addressed during session: Mobility/safety with mobility;Balance         AM-PAC PT "6 Clicks" Mobility  Outcome Measure Help needed turning from your back to your side while in a flat bed without using bedrails?: A Lot Help needed moving from lying on your back to sitting on the side of a flat bed without using bedrails?: A Lot Help needed moving to and from a bed to a chair (including a wheelchair)?: A Little Help needed standing up from a chair using your arms (e.g., wheelchair or bedside chair)?: A Little Help needed to walk in hospital room?: A Little Help needed climbing 3-5 steps with a railing? : A Lot 6 Click Score: 15    End of Session Equipment Utilized During Treatment: Gait belt Activity Tolerance: Patient tolerated treatment well Patient left: in chair;with call bell/phone within reach;with chair alarm set Nurse Communication: Mobility status PT Visit Diagnosis: Other abnormalities of gait and mobility (R26.89);Muscle weakness (generalized) (M62.81)    Time: 2694-8546 PT Time Calculation (min) (ACUTE ONLY): 24 min   Charges:   PT Evaluation $PT Eval Moderate Complexity: 1 Mod PT Treatments $Self Care/Home Management: 8-22      Mabeline Caras, PT, DPT Acute Rehabilitation Services  Pager [phone] Office [phone]  Derry Lory 12/27/2018, 2:41 PM

## 2018-12-27 NOTE — Progress Notes (Signed)
PROGRESS NOTE  [name] [name] ESP:[id number] DOB: 1925-08-11 DOA: 12/25/2018 PCP: [name], MD   LOS: 2 days   Brief Narrative / Interim history: 85 year old male with dementia, coronary artery disease, permanent A. fib, heart block status post pacemaker, lower GI bleed, hyperlipidemia, hypertension, who was admitted to the hospital on 12/25/2018 after having a ground-level fall which resulted in left hip fracture.  Orthopedic surgery consulted he was taken to the OR on 6/26 and underwent intramedullary nail  Subjective: Alert, pleasantly demented.  Repetitive questions and surprised that he had a hip fracture.  Denies any pain  Assessment & Plan: Principal Problem:   Closed intertrochanteric fracture of hip, left, initial encounter [facility]) Active Problems:   Permanent atrial fibrillation (HCC)   CHB (complete heart block) (HCC)   Hypercholesterolemia   HTN (hypertension)   Dementia ([place])   CAD (coronary artery disease)   Principal Problem Acute left intertrochanteric hip fracture -Orthopedic surgery consulted and followed patient while hospitalized.  He is status post intramedullary nail 6/26. -Continue to monitor postop, he seems to be doing well today -PT evaluation pending, suspect he will need SNF -Lovenox for DVT prophylaxis  Active Problems Dementia -Stable, wife is at bedside and he is calm and cooperative and quite pleasant -On Aricept, Zoloft  Coronary artery disease with history of CABG -No chest pain, this appears to be stable  Permanent A. fib/history of heart block/pacemaker -Heart is paced, this is stable  Hypertension -Blood pressure well controlled  Hyperlipidemia -Continue atorvastatin  BPH -Continue finasteride   Scheduled Meds:  acetaminophen  500 mg Oral Q6H   atorvastatin  40 mg Oral QPM   cholecalciferol  2,000 Units Oral Daily   docusate sodium  100 mg Oral BID   donepezil  5 mg Oral BID   enoxaparin (LOVENOX) injection  40 mg  Subcutaneous Q24H   finasteride  5 mg Oral QPM   sertraline  25 mg Oral Daily   Continuous Infusions:  sodium chloride 75 mL/hr (12/27/18 0749)   lactated ringers 10 mL/hr at 12/26/18 1513   methocarbamol (ROBAXIN) IV     PRN Meds:.acetaminophen, alum & mag hydroxide-simeth, HYDROcodone-acetaminophen, HYDROcodone-acetaminophen, magnesium citrate, menthol-cetylpyridinium **OR** phenol, methocarbamol **OR** methocarbamol (ROBAXIN) IV, morphine injection, ondansetron **OR** ondansetron (ZOFRAN) IV, polyethylene glycol, senna-docusate, sorbitol   DVT prophylaxis: Lovenox Code Status: DNR Family Communication: wife at bedside  Disposition Plan: TBD  Consultants:   Orthopedic surgery   Procedures:   Intramedullary nail intertrochanteric on the left  Antimicrobials:  None    Objective: Vitals:   12/26/18 2032 12/27/18 0024 12/27/18 0400 12/27/18 0956  BP: (!) 102/54 104/63 113/68 103/63  Pulse: 60 65 64 63  Resp: 16 12 14 17   Temp: (!) 97.4 F (36.3 C) (!) 97.4 F (36.3 C) 97.8 F (36.6 C) 97.7 F (36.5 C)  TempSrc: Oral   Oral  SpO2: 93% 95% 93% 92%  Weight:      Height:        Intake/Output Summary (Last 24 hours) at 12/27/2018 1042 Last data filed at 12/27/2018 0315 Gross per 24 hour  Intake 1747.52 ml  Output 550 ml  Net 1197.52 ml   Filed Weights   12/26/18 0256 12/26/18 1507  Weight: 63.7 kg 63.7 kg    Examination:  Constitutional: NAD Eyes: PERRL, lids and conjunctivae normal ENMT: Mucous membranes are moist.  Respiratory: clear to auscultation bilaterally, no wheezing, no crackles. Normal respiratory effort.  Cardiovascular: Regular rate and rhythm, no murmurs / rubs / gallops.  No LE edema.  Abdomen: no tenderness. Bowel sounds positive.  Musculoskeletal: no clubbing / cyanosis.  Skin: no rashes Neurologic: CN 2-12 grossly intact. Strength 5/5 in all 4.    Data Reviewed: I have independently reviewed following labs and imaging studies    CBC: Recent Labs  Lab January 13, 2019 2106 12/26/18 1859 12/27/18 0457  WBC 9.8 12.5* 10.1  HGB 11.4* 10.9* 9.9*  HCT 36.6* 34.6* 31.2*  MCV 96.1 93.8 93.7  PLT 234 218 400   Basic Metabolic Panel: Recent Labs  Lab 13-Jan-2019 2106 12/26/18 1859 12/27/18 0457  NA 138  --  136  K 4.4  --  4.4  CL 103  --  103  CO2 27  --  26  GLUCOSE 95  --  148*  BUN 24*  --  20  CREATININE 1.17 1.28* 1.17  CALCIUM 9.1  --  8.5*   GFR: Estimated Creatinine Clearance: 37.1 mL/min (by C-G formula based on SCr of 1.17 mg/dL). Liver Function Tests: Recent Labs  Lab 01/13/2019 2106  AST 29  ALT 17  ALKPHOS 99  BILITOT 0.2*  PROT 7.6  ALBUMIN 3.1*   No results for input(s): LIPASE, AMYLASE in the last 168 hours. No results for input(s): AMMONIA in the last 168 hours. Coagulation Profile: No results for input(s): INR, PROTIME in the last 168 hours. Cardiac Enzymes: No results for input(s): CKTOTAL, CKMB, CKMBINDEX, TROPONINI in the last 168 hours. BNP (last 3 results) No results for input(s): PROBNP in the last 8760 hours. HbA1C: No results for input(s): HGBA1C in the last 72 hours. CBG: No results for input(s): GLUCAP in the last 168 hours. Lipid Profile: No results for input(s): CHOL, HDL, LDLCALC, TRIG, CHOLHDL, LDLDIRECT in the last 72 hours. Thyroid Function Tests: No results for input(s): TSH, T4TOTAL, FREET4, T3FREE, THYROIDAB in the last 72 hours. Anemia Panel: No results for input(s): VITAMINB12, FOLATE, FERRITIN, TIBC, IRON, RETICCTPCT in the last 72 hours. Urine analysis:    Component Value Date/Time   COLORURINE YELLOW 11/21/2009 1617   APPEARANCEUR CLEAR 11/21/2009 1617   LABSPEC 1.017 11/21/2009 1617   PHURINE 6.0 11/21/2009 1617   GLUCOSEU NEGATIVE 11/21/2009 1617   HGBUR SMALL (A) 11/21/2009 1617   BILIRUBINUR NEGATIVE 11/21/2009 1617   KETONESUR NEGATIVE 11/21/2009 1617   PROTEINUR NEGATIVE 11/21/2009 1617   UROBILINOGEN 0.2 11/21/2009 1617   NITRITE NEGATIVE  11/21/2009 1617   LEUKOCYTESUR NEGATIVE 11/21/2009 1617   Sepsis Labs: Invalid input(s): PROCALCITONIN, LACTICIDVEN  Recent Results (from the past 240 hour(s))  SARS Coronavirus 2 (CEPHEID - Performed in [place] hospital lab), Hosp Order     Status: None   Collection Time: 01/13/2019  9:47 PM   Specimen: Nasopharyngeal Swab  Result Value Ref Range Status   SARS Coronavirus 2 NEGATIVE NEGATIVE Final    Comment: (NOTE) If result is NEGATIVE SARS-CoV-2 target nucleic acids are NOT DETECTED. The SARS-CoV-2 RNA is generally detectable in upper and lower  respiratory specimens during the acute phase of infection. The lowest  concentration of SARS-CoV-2 viral copies this assay can detect is 250  copies / mL. A negative result does not preclude SARS-CoV-2 infection  and should not be used as the sole basis for treatment or other  patient management decisions.  A negative result may occur with  improper specimen collection / handling, submission of specimen other  than nasopharyngeal swab, presence of viral mutation(s) within the  areas targeted by this assay, and inadequate number of viral copies  (<250 copies /  mL). A negative result must be combined with clinical  observations, patient history, and epidemiological information. If result is POSITIVE SARS-CoV-2 target nucleic acids are DETECTED. The SARS-CoV-2 RNA is generally detectable in upper and lower  respiratory specimens dur ing the acute phase of infection.  Positive  results are indicative of active infection with SARS-CoV-2.  Clinical  correlation with patient history and other diagnostic information is  necessary to determine patient infection status.  Positive results do  not rule out bacterial infection or co-infection with other viruses. If result is PRESUMPTIVE POSTIVE SARS-CoV-2 nucleic acids MAY BE PRESENT.   A presumptive positive result was obtained on the submitted specimen  and confirmed on repeat testing.  While  2019 novel coronavirus  (SARS-CoV-2) nucleic acids may be present in the submitted sample  additional confirmatory testing may be necessary for epidemiological  and / or clinical management purposes  to differentiate between  SARS-CoV-2 and other Sarbecovirus currently known to infect humans.  If clinically indicated additional testing with an alternate test  methodology [phone]) is advised. The SARS-CoV-2 RNA is generally  detectable in upper and lower respiratory sp ecimens during the acute  phase of infection. The expected result is Negative. Fact Sheet for Patients:  StrictlyIdeas.no Fact Sheet for Healthcare Providers: BankingDealers.co.za This test is not yet approved or cleared by the [place] FDA and has been authorized for detection and/or diagnosis of SARS-CoV-2 by FDA under an Emergency Use Authorization (EUA).  This EUA will remain in effect (meaning this test can be used) for the duration of the COVID-19 declaration under Section 564(b)(1) of the Act, 21 U.S.C. section 360bbb-3(b)(1), unless the authorization is terminated or revoked sooner. Performed at [facility], [place] [address] [place],  [address]   Surgical PCR screen     Status: None   Collection Time: 12/26/18  3:43 AM   Specimen: Nasal Mucosa; Nasal Swab  Result Value Ref Range Status   MRSA, PCR NEGATIVE NEGATIVE Final   Staphylococcus aureus NEGATIVE NEGATIVE Final    Comment: (NOTE) The Xpert SA Assay (FDA approved for NASAL specimens in patients 29 years of age and older), is one component of a comprehensive surveillance program. It is not intended to diagnose infection nor to guide or monitor treatment. Performed at [facility], [place] [address], [place],  [address]       Radiology Studies: Dg Chest 1 View  Result Date: 12/25/2018 CLINICAL DATA:  Fall EXAM: CHEST  1 VIEW COMPARISON:  06/05/2018 FINDINGS:  Post sternotomy changes. Right-sided pacing device as before. Low lung volumes. No acute opacity or pleural effusion. No pneumothorax. Old right fifth through seventh rib fractures. IMPRESSION: No active disease. Electronically Signed   By: Donavan Foil M.D.   On: 12/25/2018 21:03   Ct Head Wo Contrast  Result Date: 12/25/2018 CLINICAL DATA:  Fall EXAM: CT HEAD WITHOUT CONTRAST TECHNIQUE: Contiguous axial images were obtained from the base of the skull through the vertex without intravenous contrast. COMPARISON:  CT brain 06/05/2018 FINDINGS: Brain: Mild motion degradation. No acute territorial infarction, hemorrhage or intracranial mass. Moderate to marked atrophy. Stable ventricle size. Mild small vessel ischemic changes of the white matter. Vascular: No hyperdense vessels. Vertebral and carotid vascular calcification. Skull: No fracture Sinuses/Orbits: Mild mucosal thickening in the ethmoid and sphenoid sinuses. Other: None IMPRESSION: 1. No CT evidence for acute intracranial abnormality. 2. Atrophy and small vessel ischemic changes of the white matter. Electronically Signed   By: Maudie Mercury  Francoise Ceo M.D.   On: 12/25/2018 21:08   Dg C-arm 1-60 Min  Result Date: 12/26/2018 CLINICAL DATA:  Left intertrochanteric femur fracture ORIF. EXAM: DG C-ARM 61-120 MIN; LEFT FEMUR 2 VIEWS COMPARISON:  Bilateral hip x-rays from yesterday. FLUOROSCOPY TIME:  1 minutes, 33 seconds. C-arm fluoroscopic images were obtained intraoperatively and submitted for post operative interpretation. FINDINGS: Multiple intraoperative fluoroscopic images demonstrate interval cephalomedullary rod fixation of the left intertrochanteric femur fracture. Alignment is anatomic. IMPRESSION: Intraoperative fluoroscopic guidance for left intertrochanteric femur fracture ORIF. Electronically Signed   By: Titus Dubin M.D.   On: 12/26/2018 17:06   Dg Femur Min 2 Views Left  Result Date: 12/26/2018 CLINICAL DATA:  Left intertrochanteric femur  fracture ORIF. EXAM: DG C-ARM 61-120 MIN; LEFT FEMUR 2 VIEWS COMPARISON:  Bilateral hip x-rays from yesterday. FLUOROSCOPY TIME:  1 minutes, 33 seconds. C-arm fluoroscopic images were obtained intraoperatively and submitted for post operative interpretation. FINDINGS: Multiple intraoperative fluoroscopic images demonstrate interval cephalomedullary rod fixation of the left intertrochanteric femur fracture. Alignment is anatomic. IMPRESSION: Intraoperative fluoroscopic guidance for left intertrochanteric femur fracture ORIF. Electronically Signed   By: Titus Dubin M.D.   On: 12/26/2018 17:06   Dg Hips Bilat W Or Wo Pelvis 3-4 Views  Result Date: 12/25/2018 CLINICAL DATA:  Fall EXAM: DG HIP (WITH OR WITHOUT PELVIS) 3-4V BILAT COMPARISON:  None. FINDINGS: Vascular calcifications. Pubic symphysis and rami appear intact. Right hip shows no definitive fracture or dislocation. Suspected left intertrochanteric fracture. No left femoral head dislocation. IMPRESSION: 1. Acute left intertrochanteric fracture. 2. No acute osseous abnormality of the right hip Electronically Signed   By: Donavan Foil M.D.   On: 12/25/2018 21:02    Marzetta Board, MD, PhD [facility]  Contact via  [email or web address]  [place] P: [phone] F: [phone]

## 2018-12-27 NOTE — Progress Notes (Addendum)
Rehab Admissions Coordinator Note:  Per PT/OT recommendation, patient was screened by [name] for appropriateness for an Inpatient Acute Rehab Consult.  At this time, we are recommending Inpatient Rehab consult.  Please place IP Rehab MD consult order.   [name] 12/27/2018, 7:40 PM  I can be reached at [phone].

## 2018-12-27 NOTE — Progress Notes (Signed)
Subjective: 1 Day Post-Op Procedure(s) (LRB): INTRAMEDULLARY (IM) NAIL INTERTROCHANTRIC (Left) Patient reports pain as mild.  Follows commands well.  Likely some demitia.  Acute blood loss anemia from his fracture and surgery - tolerating well.  Objective: Vital signs in last 24 hours: Temp:  [97 F (36.1 C)-98.1 F (36.7 C)] 97.8 F (36.6 C) (06/27 0400) Pulse Rate:  [59-74] 64 (06/27 0400) Resp:  [12-19] 14 (06/27 0400) BP: (102-157)/(54-86) 113/68 (06/27 0400) SpO2:  [93 %-100 %] 93 % (06/27 0400) Weight:  [63.7 kg] 63.7 kg (06/26 1507)  Intake/Output from previous day: 06/26 0701 - 06/27 0700 In: 1747.5 [P.O.:240; I.V.:1407.5; IV Piggyback:100] Out: 750 [Urine:700; Blood:50] Intake/Output this shift: No intake/output data recorded.  Recent Labs    12/25/18 2106 12/26/18 1859 12/27/18 0457  HGB 11.4* 10.9* 9.9*   Recent Labs    12/26/18 1859 12/27/18 0457  WBC 12.5* 10.1  RBC 3.69* 3.33*  HCT 34.6* 31.2*  PLT 218 194   Recent Labs    12/25/18 2106 12/26/18 1859 12/27/18 0457  NA 138  --  136  K 4.4  --  4.4  CL 103  --  103  CO2 27  --  26  BUN 24*  --  20  CREATININE 1.17 1.28* 1.17  GLUCOSE 95  --  148*  CALCIUM 9.1  --  8.5*   No results for input(s): LABPT, INR in the last 72 hours.  Sensation intact distally Intact pulses distally Dorsiflexion/Plantar flexion intact Incision: dressing C/D/I   Assessment/Plan: 1 Day Post-Op Procedure(s) (LRB): INTRAMEDULLARY (IM) NAIL INTERTROCHANTRIC (Left) Up with therapy - WBAT left hip      Mcarthur Rossetti 12/27/2018, 8:20 AM

## 2018-12-27 NOTE — Plan of Care (Signed)
  Problem: Nutrition: Goal: Adequate nutrition will be maintained Outcome: Progressing   Problem: Pain Managment: Goal: General experience of comfort will improve Outcome: Progressing   Problem: Safety: Goal: Ability to remain free from injury will improve Outcome: Progressing Wife at the bedside.   Problem: Skin Integrity: Goal: Risk for impaired skin integrity will decrease Outcome: Progressing

## 2018-12-27 NOTE — Evaluation (Signed)
Occupational Therapy Evaluation Patient Details Name: [name] [name] MRN: [id number] DOB: 10/08/1925 Today's Date: 12/27/2018    History of Present Illness Pt is a 85 y.o. male admitted 12/25/18 after fall sustaining L intertrochanteric hip fx. Now s/p L femoral IM nail 12/26/18. PMH includes CAD, pacemaker, afib (not on anticoag due to h/o falls), HTN, dementia.   Clinical Impression   PTA, pt was living with his wife and performed ADLs with exception of assistance with bathing for safety; not using DME for mobility. Pt requiring Min A for UB ADLs, Mod-Max A for LB ADLs, and Min-Mod A for functional mobility with RW. Pt presenting with decreased balance and functional performance with pain. Despite pain, pt is motivated to participate in therapy and has good family support. Pt would benefit from further acute OT to facilitate safe dc. Recommend dc to CIR for intensive OT to optimize safety, independence with ADLs, and return safely to home and PLOF.      Follow Up Recommendations  CIR;Supervision/Assistance - 24 hour    Equipment Recommendations  Other (comment)(Defer to next venue)    Recommendations for Other Services PT consult     Precautions / Restrictions Precautions Precautions: Fall Restrictions Weight Bearing Restrictions: Yes LLE Weight Bearing: Weight bearing as tolerated      Mobility Bed Mobility Overal bed mobility: Needs Assistance Bed Mobility: Supine to Sit     Supine to sit: Mod assist;HOB elevated     General bed mobility comments: ModA to assist LLE and hips to EOB, UE support for trunk elevation  Transfers Overall transfer level: Needs assistance Equipment used: Rolling walker (2 wheeled) Transfers: Sit to/from Stand Sit to Stand: Min assist         General transfer comment: MinA for trunk elevation and to steady RW as pt pulling up on device with BUEs. Poor eccentric control into sitting due to painful LLE    Balance Overall balance  assessment: Needs assistance   Sitting balance-Leahy Scale: Fair       Standing balance-Leahy Scale: Poor Standing balance comment: Reliant on UE support                           ADL either performed or assessed with clinical judgement   ADL Overall ADL's : Needs assistance/impaired Eating/Feeding: Set up;Supervision/ safety;Sitting Eating/Feeding Details (indicate cue type and reason): Wife has been coming to assist pt with attending to meals Grooming: Sitting;Cueing for sequencing;Set up;Supervision/safety   Upper Body Bathing: Minimal assistance;Sitting   Lower Body Bathing: Moderate assistance;Sit to/from stand   Upper Body Dressing : Minimal assistance;Sitting   Lower Body Dressing: Maximal assistance;Sit to/from stand   Toilet Transfer: Minimal assistance;+2 for safety/equipment;Ambulation;RW(Simulated to recliner)           Functional mobility during ADLs: Minimal assistance;Rolling walker;+2 for safety/equipment General ADL Comments: Pt presenting with decreased balance, strength, and awareness.      Vision         Perception     Praxis      Pertinent Vitals/Pain Pain Assessment: Faces Faces Pain Scale: Hurts even more Pain Location: L hip Pain Descriptors / Indicators: Grimacing;Guarding Pain Intervention(s): Monitored during session;Limited activity within patient's tolerance;Repositioned     Hand Dominance Right   Extremity/Trunk Assessment Upper Extremity Assessment Upper Extremity Assessment: Generalized weakness   Lower Extremity Assessment Lower Extremity Assessment: Defer to PT evaluation LLE Deficits / Details: s/p L femur IM nail; hip and knee < 3/5, limited  by pain LLE Coordination: decreased gross motor;decreased fine motor   Cervical / Trunk Assessment Cervical / Trunk Assessment: Kyphotic   Communication Communication Communication: HOH   Cognition Arousal/Alertness: Awake/alert Behavior During Therapy: WFL for  tasks assessed/performed Overall Cognitive Status: History of cognitive impairments - at baseline Area of Impairment: Orientation;Attention;Memory;Following commands;Safety/judgement;Awareness;Problem solving                 Orientation Level: Disoriented to;Time;Situation Current Attention Level: Sustained Memory: Decreased short-term memory Following Commands: Follows one step commands with increased time Safety/Judgement: Decreased awareness of deficits;Decreased awareness of safety Awareness: Intellectual Problem Solving: Requires verbal cues General Comments: Poor short-term memory, requiring frequent reorientation and redirection to task. Able to state wife's name. Very pleasant. Wife confirms pt near baseline cognition and gets more confused in unfamiliar environment   General Comments  PT discussing with wife rehab options and support at home.    Exercises     Shoulder Instructions      Home Living Family/patient expects to be discharged to:: Private residence Living Arrangements: Spouse/significant other Available Help at Discharge: Family;Available 24 hours/day Type of Home: House Home Access: Stairs to enter [employer] of Steps: 2 Entrance Stairs-Rails: None Home Layout: Two level;1/2 bath on main level Alternate Level Stairs-Number of Steps: 13 Alternate Level Stairs-Rails: Left Bathroom Shower/Tub: Teacher, early years/pre: Standard     Home Equipment: Grab bars - tub/shower          Prior Functioning/Environment Level of Independence: Needs assistance  Gait / Transfers Assistance Needed: Ambulates without DME. Son present to supervise steps to get upstairs to shower 1x/wk ADL's / Homemaking Assistance Needed: Wife reports pt indep with dressing and toileting; son assists with bathing. Wife does majority of cooking/household tasks            OT Problem List: Decreased strength;Decreased range of motion;Decreased activity  tolerance;Impaired balance (sitting and/or standing);Decreased safety awareness;Decreased knowledge of use of DME or AE;Decreased knowledge of precautions;Decreased cognition;Pain      OT Treatment/Interventions: Self-care/ADL training;Therapeutic exercise;Energy conservation;DME and/or AE instruction;Therapeutic activities;Patient/family education    OT Goals(Current goals can be found in the care plan section) Acute Rehab OT Goals Patient Stated Goal: Wife agreeable to rehab before pt returns home OT Goal Formulation: With patient Time For Goal Achievement: 01/10/19 Potential to Achieve Goals: Good  OT Frequency: Min 2X/week   Barriers to D/C:            Co-evaluation PT/OT/SLP Co-Evaluation/Treatment: Yes Reason for Co-Treatment: For patient/therapist safety;To address functional/ADL transfers   OT goals addressed during session: ADL's and self-care      AM-PAC OT "6 Clicks" Daily Activity     Outcome Measure Help from another person eating meals?: A Little Help from another person taking care of personal grooming?: A Little Help from another person toileting, which includes using toliet, bedpan, or urinal?: A Little Help from another person bathing (including washing, rinsing, drying)?: A Lot Help from another person to put on and taking off regular upper body clothing?: A Little Help from another person to put on and taking off regular lower body clothing?: A Lot 6 Click Score: 16   End of Session Equipment Utilized During Treatment: Gait belt;Rolling walker Nurse Communication: Mobility status;Weight bearing status  Activity Tolerance: Patient tolerated treatment well Patient left: in chair;with call bell/phone within reach;with chair alarm set  OT Visit Diagnosis: Unsteadiness on feet (R26.81);Other abnormalities of gait and mobility (R26.89);Muscle weakness (generalized) (M62.81);Other symptoms and signs involving  cognitive function;Pain Pain - Right/Left: Left Pain  - part of body: Hip                Time: 7902-4097 OT Time Calculation (min): 19 min Charges:  OT General Charges $OT Visit: 1 Visit OT Evaluation $OT Eval Moderate Complexity: [place], OTR/L Acute Rehab Pager: [phone] Office: [place] 12/27/2018, 5:09 PM

## 2018-12-28 LAB — BASIC METABOLIC PANEL
Anion gap: 8 (ref 5–15)
BUN: 31 mg/dL — ABNORMAL HIGH (ref 8–23)
CO2: 23 mmol/L (ref 22–32)
Calcium: 8.5 mg/dL — ABNORMAL LOW (ref 8.9–10.3)
Chloride: 104 mmol/L (ref 98–111)
Creatinine, Ser: 1.24 mg/dL (ref 0.61–1.24)
GFR calc Af Amer: 59 mL/min — ABNORMAL LOW (ref >60)
GFR calc non Af Amer: 51 mL/min — ABNORMAL LOW (ref >60)
Glucose, Bld: 88 mg/dL (ref 70–99)
Potassium: 4.1 mmol/L (ref 3.5–5.1)
Sodium: 135 mmol/L (ref 135–145)

## 2018-12-28 LAB — CBC
HCT: 28.5 % — ABNORMAL LOW (ref 39.0–52.0)
Hemoglobin: 8.9 g/dL — ABNORMAL LOW (ref 13.0–17.0)
MCH: 28.9 pg (ref 26.0–34.0)
MCHC: 31.2 g/dL (ref 30.0–36.0)
MCV: 92.5 fL (ref 80.0–100.0)
Platelets: 191 10*3/uL (ref 150–400)
RBC: 3.08 MIL/uL — ABNORMAL LOW (ref 4.22–5.81)
RDW: 15.5 % (ref 11.5–15.5)
WBC: 9.1 10*3/uL (ref 4.0–10.5)
nRBC: 0 % (ref 0.0–0.2)

## 2018-12-28 MED ORDER — POLYETHYLENE GLYCOL 3350 17 G PO PACK
34.0000 g | PACK | Freq: Every day | ORAL | Status: DC
  Administered 2018-12-28: 34 g via ORAL
  Filled 2018-12-28 (×2): qty 2

## 2018-12-28 NOTE — Progress Notes (Signed)
PROGRESS NOTE  [name] [name] QBH:[id number] DOB: Feb 20, 1926 DOA: 12/25/2018 PCP: [name], MD   LOS: 3 days   Brief Narrative / Interim history: 85 year old male with dementia, coronary artery disease, permanent A. fib, heart block status post pacemaker, lower GI bleed, hyperlipidemia, hypertension, who was admitted to the hospital on 12/25/2018 after having a ground-level fall which resulted in left hip fracture.  Orthopedic surgery consulted he was taken to the OR on 6/26 and underwent intramedullary nail  Subjective: Pleasantly demented this morning, denies any hip pain.  No shortness of breath.  No abdominal pain, no nausea or vomiting.  Assessment & Plan: Principal Problem:   Closed intertrochanteric fracture of hip, left, initial encounter [facility]) Active Problems:   Permanent atrial fibrillation (HCC)   CHB (complete heart block) (HCC)   Hypercholesterolemia   HTN (hypertension)   Dementia (HCC)   CAD (coronary artery disease)   Principal Problem Acute left intertrochanteric hip fracture -Orthopedic surgery consulted and followed patient while hospitalized.  He is status post intramedullary nail 6/26. -Continue to monitor postop, he seems to be doing well today -PT recommended CIR however in discussion with the patient and his wife at bedside this morning they would prefer for him to go to SNF [facility] and have his PT done there since his daughter who is a Education officer, museum there -Continue Lovenox for DVT prophylaxis  Active Problems Acute blood loss anemia -Likely postop, continue to monitor hemoglobin, no need for transfusion unless hemoglobin less than 7  Dementia -Stable, without behavioral issues -On Aricept, Zoloft  Coronary artery disease with history of CABG -No chest pain, this appears to be stable  Permanent A. fib/history of heart block/pacemaker -Heart is paced, this is stable  Hypertension -Blood pressure well controlled  Hyperlipidemia  -Continue atorvastatin  BPH -Continue finasteride  Constipation -Increase bowel regimen today  Scheduled Meds: . atorvastatin  40 mg Oral QPM  . cholecalciferol  2,000 Units Oral Daily  . docusate sodium  100 mg Oral BID  . donepezil  5 mg Oral BID  . enoxaparin (LOVENOX) injection  40 mg Subcutaneous Q24H  . finasteride  5 mg Oral QPM  . polyethylene glycol  34 g Oral Daily  . sertraline  25 mg Oral Daily   Continuous Infusions: . sodium chloride 75 mL/hr (12/27/18 0749)  . lactated ringers 10 mL/hr at 12/26/18 1513  . methocarbamol (ROBAXIN) IV     PRN Meds:.acetaminophen, alum & mag hydroxide-simeth, HYDROcodone-acetaminophen, HYDROcodone-acetaminophen, magnesium citrate, menthol-cetylpyridinium **OR** phenol, methocarbamol **OR** methocarbamol (ROBAXIN) IV, morphine injection, ondansetron **OR** ondansetron (ZOFRAN) IV, senna-docusate, sorbitol   DVT prophylaxis: Lovenox Code Status: DNR Family Communication: wife at bedside  Disposition Plan: TBD  Consultants:   Orthopedic surgery   Procedures:   Intramedullary nail intertrochanteric on the left  Antimicrobials:  None    Objective: Vitals:   12/27/18 1834 12/27/18 2045 12/28/18 0405 12/28/18 0741  BP: 94/60 (!) 105/56 114/62 117/68  Pulse: 67 65 65 69  Resp: 17 16 14 16   Temp: 97.8 F (36.6 C) 97.8 F (36.6 C) (!) 97.5 F (36.4 C) 98.1 F (36.7 C)  TempSrc: Oral Oral Oral Oral  SpO2: 96% 95% 95% 98%  Weight:      Height:        Intake/Output Summary (Last 24 hours) at 12/28/2018 0749 Last data filed at 12/27/2018 1504 Gross per 24 hour  Intake -  Output 50 ml  Net -50 ml   Filed Weights   12/26/18 0256  12/26/18 1507  Weight: 63.7 kg 63.7 kg    Examination:  Constitutional: No distress, pleasant Eyes: No scleral icterus ENMT: mmm Respiratory: Lungs are clear bilaterally, no wheezing or crackles heard.  Normal respiratory effort Cardiovascular: Regular rate and rhythm, no new murmurs.   No peripheral edema. Abdomen: Soft, nontender, nondistended, positive bowel sounds Musculoskeletal: no clubbing / cyanosis.  Skin: No rash seen Neurologic: Equal strength   Data Reviewed: I have independently reviewed following labs and imaging studies   CBC: Recent Labs  Lab 12/25/18 2106 12/26/18 1859 12/27/18 0457 12/28/18 0411  WBC 9.8 12.5* 10.1 9.1  HGB 11.4* 10.9* 9.9* 8.9*  HCT 36.6* 34.6* 31.2* 28.5*  MCV 96.1 93.8 93.7 92.5  PLT 234 218 194 081   Basic Metabolic Panel: Recent Labs  Lab 12/25/18 2106 12/26/18 1859 12/27/18 0457 12/28/18 0411  NA 138  --  136 135  K 4.4  --  4.4 4.1  CL 103  --  103 104  CO2 27  --  26 23  GLUCOSE 95  --  148* 88  BUN 24*  --  20 31*  CREATININE 1.17 1.28* 1.17 1.24  CALCIUM 9.1  --  8.5* 8.5*   GFR: Estimated Creatinine Clearance: 35 mL/min (by C-G formula based on SCr of 1.24 mg/dL). Liver Function Tests: Recent Labs  Lab 12/25/18 2106  AST 29  ALT 17  ALKPHOS 99  BILITOT 0.2*  PROT 7.6  ALBUMIN 3.1*   No results for input(s): LIPASE, AMYLASE in the last 168 hours. No results for input(s): AMMONIA in the last 168 hours. Coagulation Profile: No results for input(s): INR, PROTIME in the last 168 hours. Cardiac Enzymes: No results for input(s): CKTOTAL, CKMB, CKMBINDEX, TROPONINI in the last 168 hours. BNP (last 3 results) No results for input(s): PROBNP in the last 8760 hours. HbA1C: No results for input(s): HGBA1C in the last 72 hours. CBG: No results for input(s): GLUCAP in the last 168 hours. Lipid Profile: No results for input(s): CHOL, HDL, LDLCALC, TRIG, CHOLHDL, LDLDIRECT in the last 72 hours. Thyroid Function Tests: No results for input(s): TSH, T4TOTAL, FREET4, T3FREE, THYROIDAB in the last 72 hours. Anemia Panel: No results for input(s): VITAMINB12, FOLATE, FERRITIN, TIBC, IRON, RETICCTPCT in the last 72 hours. Urine analysis:    Component Value Date/Time   COLORURINE YELLOW 11/21/2009 1617    APPEARANCEUR CLEAR 11/21/2009 1617   LABSPEC 1.017 11/21/2009 1617   PHURINE 6.0 11/21/2009 1617   GLUCOSEU NEGATIVE 11/21/2009 1617   HGBUR SMALL (A) 11/21/2009 1617   BILIRUBINUR NEGATIVE 11/21/2009 1617   KETONESUR NEGATIVE 11/21/2009 1617   PROTEINUR NEGATIVE 11/21/2009 1617   UROBILINOGEN 0.2 11/21/2009 1617   NITRITE NEGATIVE 11/21/2009 1617   LEUKOCYTESUR NEGATIVE 11/21/2009 1617   Sepsis Labs: Invalid input(s): PROCALCITONIN, LACTICIDVEN  Recent Results (from the past 240 hour(s))  SARS Coronavirus 2 (CEPHEID - Performed in [place] hospital lab), Hosp Order     Status: None   Collection Time: 12/25/18  9:47 PM   Specimen: Nasopharyngeal Swab  Result Value Ref Range Status   SARS Coronavirus 2 NEGATIVE NEGATIVE Final    Comment: (NOTE) If result is NEGATIVE SARS-CoV-2 target nucleic acids are NOT DETECTED. The SARS-CoV-2 RNA is generally detectable in upper and lower  respiratory specimens during the acute phase of infection. The lowest  concentration of SARS-CoV-2 viral copies this assay can detect is 250  copies / mL. A negative result does not preclude SARS-CoV-2 infection  and should not be  used as the sole basis for treatment or other  patient management decisions.  A negative result may occur with  improper specimen collection / handling, submission of specimen other  than nasopharyngeal swab, presence of viral mutation(s) within the  areas targeted by this assay, and inadequate number of viral copies  (<250 copies / mL). A negative result must be combined with clinical  observations, patient history, and epidemiological information. If result is POSITIVE SARS-CoV-2 target nucleic acids are DETECTED. The SARS-CoV-2 RNA is generally detectable in upper and lower  respiratory specimens dur ing the acute phase of infection.  Positive  results are indicative of active infection with SARS-CoV-2.  Clinical  correlation with patient history and other diagnostic  information is  necessary to determine patient infection status.  Positive results do  not rule out bacterial infection or co-infection with other viruses. If result is PRESUMPTIVE POSTIVE SARS-CoV-2 nucleic acids MAY BE PRESENT.   A presumptive positive result was obtained on the submitted specimen  and confirmed on repeat testing.  While 2019 novel coronavirus  (SARS-CoV-2) nucleic acids may be present in the submitted sample  additional confirmatory testing may be necessary for epidemiological  and / or clinical management purposes  to differentiate between  SARS-CoV-2 and other Sarbecovirus currently known to infect humans.  If clinically indicated additional testing with an alternate test  methodology [phone]) is advised. The SARS-CoV-2 RNA is generally  detectable in upper and lower respiratory sp ecimens during the acute  phase of infection. The expected result is Negative. Fact Sheet for Patients:  StrictlyIdeas.no Fact Sheet for Healthcare Providers: BankingDealers.co.za This test is not yet approved or cleared by the [place] FDA and has been authorized for detection and/or diagnosis of SARS-CoV-2 by FDA under an Emergency Use Authorization (EUA).  This EUA will remain in effect (meaning this test can be used) for the duration of the COVID-19 declaration under Section 564(b)(1) of the Act, 21 U.S.C. section 360bbb-3(b)(1), unless the authorization is terminated or revoked sooner. Performed at [facility], [place] [address], [place], [place] [address]   Surgical PCR screen     Status: None   Collection Time: 12/26/18  3:43 AM   Specimen: Nasal Mucosa; Nasal Swab  Result Value Ref Range Status   MRSA, PCR NEGATIVE NEGATIVE Final   Staphylococcus aureus NEGATIVE NEGATIVE Final    Comment: (NOTE) The Xpert SA Assay (FDA approved for NASAL specimens in patients 12 years of age and older), is one component  of a comprehensive surveillance program. It is not intended to diagnose infection nor to guide or monitor treatment. Performed at [facility], [place] [address], [place], [place] [address]       Radiology Studies: Dg C-arm 1-60 Min  Result Date: 12/26/2018 CLINICAL DATA:  Left intertrochanteric femur fracture ORIF. EXAM: DG C-ARM 61-120 MIN; LEFT FEMUR 2 VIEWS COMPARISON:  Bilateral hip x-rays from yesterday. FLUOROSCOPY TIME:  1 minutes, 33 seconds. C-arm fluoroscopic images were obtained intraoperatively and submitted for post operative interpretation. FINDINGS: Multiple intraoperative fluoroscopic images demonstrate interval cephalomedullary rod fixation of the left intertrochanteric femur fracture. Alignment is anatomic. IMPRESSION: Intraoperative fluoroscopic guidance for left intertrochanteric femur fracture ORIF. Electronically Signed   By: Titus Dubin M.D.   On: 12/26/2018 17:06   Dg Femur Min 2 Views Left  Result Date: 12/26/2018 CLINICAL DATA:  Left intertrochanteric femur fracture ORIF. EXAM: DG C-ARM 61-120 MIN; LEFT FEMUR 2 VIEWS COMPARISON:  Bilateral hip x-rays from yesterday. FLUOROSCOPY  TIME:  1 minutes, 33 seconds. C-arm fluoroscopic images were obtained intraoperatively and submitted for post operative interpretation. FINDINGS: Multiple intraoperative fluoroscopic images demonstrate interval cephalomedullary rod fixation of the left intertrochanteric femur fracture. Alignment is anatomic. IMPRESSION: Intraoperative fluoroscopic guidance for left intertrochanteric femur fracture ORIF. Electronically Signed   By: Titus Dubin M.D.   On: 12/26/2018 17:06    Marzetta Board, MD, PhD [facility]  Contact via  [email or web address]  [place] P: [phone] F: [phone]

## 2018-12-28 NOTE — Plan of Care (Signed)
  Problem: Education: Goal: Knowledge of General Education information will improve Description Including pain rating scale, medication(s)/side effects and non-pharmacologic comfort measures Outcome: Progressing   

## 2018-12-28 NOTE — Plan of Care (Signed)
  Problem: Nutrition: Goal: Adequate nutrition will be maintained Outcome: Progressing   Problem: Coping: Goal: Level of anxiety will decrease Outcome: Progressing   Problem: Elimination: Goal: Will not experience complications related to bowel motility Outcome: Progressing Goal: Will not experience complications related to urinary retention Outcome: Progressing   Problem: Pain Managment: Goal: General experience of comfort will improve Outcome: Progressing   Problem: Safety: Goal: Ability to remain free from injury will improve Outcome: Progressing Wife at the bedside.   Problem: Skin Integrity: Goal: Risk for impaired skin integrity will decrease Outcome: Progressing

## 2018-12-29 ENCOUNTER — Other Ambulatory Visit: Payer: Self-pay | Admitting: Internal Medicine

## 2018-12-29 ENCOUNTER — Encounter (HOSPITAL_COMMUNITY): Payer: Self-pay | Admitting: Orthopaedic Surgery

## 2018-12-29 DIAGNOSIS — G301 Alzheimer's disease with late onset: Secondary | ICD-10-CM | POA: Diagnosis not present

## 2018-12-29 DIAGNOSIS — R531 Weakness: Secondary | ICD-10-CM | POA: Diagnosis not present

## 2018-12-29 DIAGNOSIS — R2681 Unsteadiness on feet: Secondary | ICD-10-CM | POA: Diagnosis not present

## 2018-12-29 DIAGNOSIS — Z950 Presence of cardiac pacemaker: Secondary | ICD-10-CM | POA: Diagnosis not present

## 2018-12-29 DIAGNOSIS — S72142A Displaced intertrochanteric fracture of left femur, initial encounter for closed fracture: Secondary | ICD-10-CM | POA: Diagnosis not present

## 2018-12-29 DIAGNOSIS — K5790 Diverticulosis of intestine, part unspecified, without perforation or abscess without bleeding: Secondary | ICD-10-CM | POA: Diagnosis not present

## 2018-12-29 DIAGNOSIS — M2550 Pain in unspecified joint: Secondary | ICD-10-CM | POA: Diagnosis not present

## 2018-12-29 DIAGNOSIS — I4821 Permanent atrial fibrillation: Secondary | ICD-10-CM | POA: Diagnosis not present

## 2018-12-29 DIAGNOSIS — B379 Candidiasis, unspecified: Secondary | ICD-10-CM | POA: Diagnosis not present

## 2018-12-29 DIAGNOSIS — M6281 Muscle weakness (generalized): Secondary | ICD-10-CM | POA: Diagnosis not present

## 2018-12-29 DIAGNOSIS — S72142S Displaced intertrochanteric fracture of left femur, sequela: Secondary | ICD-10-CM | POA: Diagnosis not present

## 2018-12-29 DIAGNOSIS — N400 Enlarged prostate without lower urinary tract symptoms: Secondary | ICD-10-CM | POA: Diagnosis not present

## 2018-12-29 DIAGNOSIS — Z7401 Bed confinement status: Secondary | ICD-10-CM | POA: Diagnosis not present

## 2018-12-29 DIAGNOSIS — D62 Acute posthemorrhagic anemia: Secondary | ICD-10-CM | POA: Diagnosis not present

## 2018-12-29 DIAGNOSIS — I442 Atrioventricular block, complete: Secondary | ICD-10-CM | POA: Diagnosis not present

## 2018-12-29 DIAGNOSIS — I2510 Atherosclerotic heart disease of native coronary artery without angina pectoris: Secondary | ICD-10-CM | POA: Diagnosis not present

## 2018-12-29 DIAGNOSIS — I482 Chronic atrial fibrillation, unspecified: Secondary | ICD-10-CM | POA: Diagnosis not present

## 2018-12-29 DIAGNOSIS — M25552 Pain in left hip: Secondary | ICD-10-CM | POA: Diagnosis present

## 2018-12-29 DIAGNOSIS — S72002A Fracture of unspecified part of neck of left femur, initial encounter for closed fracture: Secondary | ICD-10-CM | POA: Diagnosis not present

## 2018-12-29 DIAGNOSIS — F0390 Unspecified dementia without behavioral disturbance: Secondary | ICD-10-CM | POA: Diagnosis not present

## 2018-12-29 DIAGNOSIS — Z7389 Other problems related to life management difficulty: Secondary | ICD-10-CM | POA: Diagnosis not present

## 2018-12-29 DIAGNOSIS — E7800 Pure hypercholesterolemia, unspecified: Secondary | ICD-10-CM | POA: Diagnosis not present

## 2018-12-29 DIAGNOSIS — R3915 Urgency of urination: Secondary | ICD-10-CM | POA: Diagnosis not present

## 2018-12-29 DIAGNOSIS — Z9181 History of falling: Secondary | ICD-10-CM | POA: Diagnosis not present

## 2018-12-29 DIAGNOSIS — R29898 Other symptoms and signs involving the musculoskeletal system: Secondary | ICD-10-CM | POA: Diagnosis not present

## 2018-12-29 DIAGNOSIS — S72142D Displaced intertrochanteric fracture of left femur, subsequent encounter for closed fracture with routine healing: Secondary | ICD-10-CM | POA: Diagnosis not present

## 2018-12-29 DIAGNOSIS — Z20828 Contact with and (suspected) exposure to other viral communicable diseases: Secondary | ICD-10-CM | POA: Diagnosis not present

## 2018-12-29 DIAGNOSIS — I714 Abdominal aortic aneurysm, without rupture: Secondary | ICD-10-CM | POA: Diagnosis not present

## 2018-12-29 DIAGNOSIS — I10 Essential (primary) hypertension: Secondary | ICD-10-CM | POA: Diagnosis not present

## 2018-12-29 DIAGNOSIS — N3946 Mixed incontinence: Secondary | ICD-10-CM | POA: Diagnosis not present

## 2018-12-29 DIAGNOSIS — F0281 Dementia in other diseases classified elsewhere with behavioral disturbance: Secondary | ICD-10-CM | POA: Diagnosis not present

## 2018-12-29 LAB — CBC
HCT: 29.2 % — ABNORMAL LOW (ref 39.0–52.0)
Hemoglobin: 9.4 g/dL — ABNORMAL LOW (ref 13.0–17.0)
MCH: 29.5 pg (ref 26.0–34.0)
MCHC: 32.2 g/dL (ref 30.0–36.0)
MCV: 91.5 fL (ref 80.0–100.0)
Platelets: 213 10*3/uL (ref 150–400)
RBC: 3.19 MIL/uL — ABNORMAL LOW (ref 4.22–5.81)
RDW: 15.6 % — ABNORMAL HIGH (ref 11.5–15.5)
WBC: 8.8 10*3/uL (ref 4.0–10.5)
nRBC: 0 % (ref 0.0–0.2)

## 2018-12-29 LAB — BASIC METABOLIC PANEL
Anion gap: 10 (ref 5–15)
BUN: 30 mg/dL — ABNORMAL HIGH (ref 8–23)
CO2: 23 mmol/L (ref 22–32)
Calcium: 8.6 mg/dL — ABNORMAL LOW (ref 8.9–10.3)
Chloride: 104 mmol/L (ref 98–111)
Creatinine, Ser: 1.12 mg/dL (ref 0.61–1.24)
GFR calc Af Amer: >60 mL/min (ref >60)
GFR calc non Af Amer: 57 mL/min — ABNORMAL LOW (ref >60)
Glucose, Bld: 97 mg/dL (ref 70–99)
Potassium: 4.3 mmol/L (ref 3.5–5.1)
Sodium: 137 mmol/L (ref 135–145)

## 2018-12-29 MED ORDER — OXYCODONE-ACETAMINOPHEN 5-325 MG PO TABS: 1.0000 | ORAL_TABLET | Freq: Four times a day (QID) | ORAL | 0 refills | Status: DC | PRN

## 2018-12-29 NOTE — TOC Transition Note (Signed)
Transition of Care [facility]) - CM/SW Discharge Note   Patient Details  Name: [name] [name] MRN: [id number] Date of Birth: July 16, 1925  Transition of Care [facility]) CM/SW Contact:  Alberteen Sam, LCSW Phone Number: 12/29/2018, 11:26 AM   Clinical Narrative:     Patient will DC to: [facility] Anticipated DC date: 12/29/2018 Family notified: [name] Transport [id number]  Per MD patient ready for DC to [facility] . RN, patient, patient's family, and facility notified of DC. Discharge Summary sent to facility. RN given number for report  [phone] ask for patient's nurse and you will be transferred. DC packet on chart. Ambulance transport requested for patient.  CSW signing off.  [place], [place]   Final next level of care: Skilled Nursing Facility Barriers to Discharge: No Barriers Identified   Patient Goals and CMS Choice Patient states their goals for this hospitalization and ongoing recovery are:: family would like patient to go to short term rehab after hospital CMS Medicare.gov Compare Post Acute Care list provided to:: Patient Represenative (must comment)([name] (spouse)) Choice offered to / list presented to : Spouse([name])  Discharge Placement PASRR number recieved: 12/29/18            Patient chooses bed at: [facility] Patient to be transferred to facility by: [place] Name of family member notified: [name] Patient and family notified of of transfer: 12/29/18  Discharge Plan and Services     Post Acute Care Choice: [place]                               Social Determinants of Health (SDOH) Interventions     Readmission Risk Interventions No flowsheet data found.

## 2018-12-29 NOTE — NC FL2 (Signed)
[place] LEVEL OF CARE SCREENING TOOL     IDENTIFICATION  Patient Name: [name] [name] Birthdate: 30-Jul-1925 Sex: male Admission Date (Current Location): 12/25/2018  [facility] and [place] Number:  Herbalist and Address:  [facility], [place] [address] [place], [place] [address]      Provider Number: [id number]  Attending Physician Name and Address:  Caren Griffins, MD  Relative Name and Phone Number:  Broadus John [phone]    Current Level of Care: Hospital Recommended Level of Care: [facility] Prior Approval Number:    Date Approved/Denied:   PASRR Number: [id number]  Discharge Plan: SNF    Current Diagnoses: Patient Active Problem List   Diagnosis Date Noted  . Closed displaced intertrochanteric fracture of left femur ([place]) 12/25/2018  . Closed intertrochanteric fracture of hip, left, initial encounter ([place]) 12/25/2018  . Pain due to onychomycosis of toenails of both feet 12/12/2018  . Acute blood loss anemia 06/08/2018  . GI bleed 06/05/2018  . HTN (hypertension)   . High cholesterol   . Diverticulosis   . Dementia ([place])   . CAD (coronary artery disease)   . Lower GI bleed   . AAA (abdominal aortic aneurysm) without rupture ([place]) 01/09/2018  . Ascending aortic aneurysm ([place]) 01/09/2018  . Heart block AV second degree 07/15/2017  . Dementia arising in the senium and presenium ([place]) 07/15/2017  . Status post three vessel coronary artery bypass 07/15/2017  . Left ventricular systolic dysfunction without heart failure 05/11/2017  . Hypercholesterolemia 05/11/2017  . Long term current use of anticoagulant 08/02/2016  . CHB (complete heart block) ([place]) 06/07/2015  . Alzheimer's disease ([place]) 01/26/2015  . Dementia with behavioral disturbance ([place]) 07/28/2013  . Pacemaker 01/26/2013  . Permanent atrial fibrillation ([place]) 01/26/2013  . Second degree AV block, Mobitz type II 01/26/2013  . Coronary  artery disease involving native coronary artery of native heart without angina pectoris 01/26/2013  . Dyspnea 01/26/2013    Orientation RESPIRATION BLADDER Height & Weight     Self  Normal Continent Weight: 140 lb 6.9 oz (63.7 kg) Height:  6\' 3"  (190.5 cm)  BEHAVIORAL SYMPTOMS/MOOD NEUROLOGICAL BOWEL NUTRITION STATUS      Continent Diet(see discharge summary)  AMBULATORY STATUS COMMUNICATION OF NEEDS Skin   Limited Assist(rolling walker) Verbally Skin abrasions(abrasion left hip, left hip closed surgical incision)                       Personal Care Assistance Level of Assistance  Bathing, Feeding, Dressing, Total care Bathing Assistance: Limited assistance Feeding assistance: Limited assistance Dressing Assistance: Limited assistance Total Care Assistance: Limited assistance   Functional Limitations Info  Sight, Hearing, Speech Sight Info: Adequate Hearing Info: Adequate Speech Info: Adequate    SPECIAL CARE FACTORS FREQUENCY  PT (By licensed PT), OT (By licensed OT)     PT Frequency: min 5x weekly OT Frequency: min 5x weekly            Contractures Contractures Info: Not present    Additional Factors Info  Code Status, Allergies Code Status Info: DNR Allergies Info: Amiodarone, Antihistamines, Diphenhydramine-type           Current Medications (12/29/2018):  This is the current hospital active medication list Current Facility-Administered Medications  Medication Dose Route Frequency Provider Last Rate Last Dose  . 0.9 %  sodium chloride infusion   Intravenous Continuous Leandrew Koyanagi, MD 75 mL/hr at 12/27/18 0749 75  mL/hr at 12/27/18 0749  . acetaminophen (TYLENOL) tablet 325-650 mg  325-650 mg Oral Q6H PRN Leandrew Koyanagi, MD   650 mg at 12/28/18 1203  . alum & mag hydroxide-simeth (MAALOX/MYLANTA) 200-200-20 MG/5ML suspension 30 mL  30 mL Oral Q4H PRN Leandrew Koyanagi, MD      . atorvastatin (LIPITOR) tablet 40 mg  40 mg Oral QPM Leandrew Koyanagi, MD   40 mg at  12/28/18 1650  . cholecalciferol (VITAMIN D3) tablet 2,000 Units  2,000 Units Oral Daily Leandrew Koyanagi, MD   2,000 Units at 12/29/18 0850  . docusate sodium (COLACE) capsule 100 mg  100 mg Oral BID Leandrew Koyanagi, MD   100 mg at 12/28/18 0948  . donepezil (ARICEPT) tablet 5 mg  5 mg Oral BID Leandrew Koyanagi, MD   5 mg at 12/29/18 0849  . enoxaparin (LOVENOX) injection 40 mg  40 mg Subcutaneous Q24H Leandrew Koyanagi, MD   40 mg at 12/28/18 1203  . finasteride (PROSCAR) tablet 5 mg  5 mg Oral QPM Leandrew Koyanagi, MD   5 mg at 12/28/18 1650  . HYDROcodone-acetaminophen (NORCO) 7.5-325 MG per tablet 1-2 tablet  1-2 tablet Oral Q4H PRN Leandrew Koyanagi, MD      . HYDROcodone-acetaminophen (NORCO/VICODIN) 5-325 MG per tablet 1-2 tablet  1-2 tablet Oral Q4H PRN Leandrew Koyanagi, MD      . lactated ringers infusion   Intravenous Continuous Leandrew Koyanagi, MD 10 mL/hr at 12/26/18 1513    . magnesium citrate solution 1 Bottle  1 Bottle Oral Once PRN Leandrew Koyanagi, MD      . menthol-cetylpyridinium (CEPACOL) lozenge 3 mg  1 lozenge Oral PRN Leandrew Koyanagi, MD       Or  . phenol (CHLORASEPTIC) mouth spray 1 spray  1 spray Mouth/Throat PRN Leandrew Koyanagi, MD      . methocarbamol (ROBAXIN) tablet 500 mg  500 mg Oral Q6H PRN Leandrew Koyanagi, MD       Or  . methocarbamol (ROBAXIN) 500 mg in dextrose 5 % 50 mL IVPB  500 mg Intravenous Q6H PRN Leandrew Koyanagi, MD      . morphine 2 MG/ML injection 0.5 mg  0.5 mg Intravenous Q2H PRN Dana Allan I, MD      . ondansetron (ZOFRAN) tablet 4 mg  4 mg Oral Q6H PRN Leandrew Koyanagi, MD       Or  . ondansetron (ZOFRAN) injection 4 mg  4 mg Intravenous Q6H PRN Leandrew Koyanagi, MD      . polyethylene glycol (MIRALAX / GLYCOLAX) packet 34 g  34 g Oral Daily Caren Griffins, MD   34 g at 12/28/18 0951  . senna-docusate (Senokot-S) tablet 1 tablet  1 tablet Oral QHS PRN Leandrew Koyanagi, MD      . sertraline (ZOLOFT) tablet 25 mg  25 mg Oral Daily Leandrew Koyanagi, MD   25 mg at 12/29/18 0850  .  sorbitol 70 % solution 30 mL  30 mL Oral Daily PRN Leandrew Koyanagi, MD         Discharge Medications: Please see discharge summary for a list of discharge medications.  Relevant Imaging Results:  Relevant Lab Results:   Additional Information SSN: [id number]  Alberteen Sam, LCSW

## 2018-12-29 NOTE — Discharge Summary (Signed)
Physician Discharge Summary  [name] [name] CNO:[id number] DOB: [birth year] DOA: 12/25/2018  PCP: [name], MD  Admit date: 12/25/2018 Discharge date: 12/29/2018  Admitted From: home Disposition:  SNF - [facility]  Recommendations for Outpatient Follow-up:  1. Follow up with PCP in 1-2 weeks 2. Follow up with Dr. Erlinda Hong orthopedic surgery in 2 weeks   Home Health: none Equipment/Devices: none  Discharge Condition: stable CODE STATUS: DNR Diet recommendation: regular  HPI: Per admitting MD, [name] [name] is a 85 y.o. male with medical history significant for CAD status post CABG, CHB status post PPM, permanent atrial fibrillation not on anticoagulation due to history of GI bleed and falls, hypertension, hyperlipidemia, dementia, ascending aortic aneurysm, and abdominal aortic aneurysm who presents to the ED for evaluation of left hip pain after a fall.  Patient is unable to provide history due to dementia with memory deficits therefore entirety of history is obtained from wife at bedside, EDP, and chart review. Wife states that patient and she were walking out of the house towards a car when he was turning around lost balance and fell onto his left side.  He had pain in his left hip after the fall.  He did not lose consciousness.  EMS were called and he was brought to the ED for further evaluation.  ED Course: In the ED initial vitals showed BP 124/67, pulse 70, RR 16, temp 98.7 Fahrenheit, SPO2 95% on room air. Labs are notable for WBC 9.8, hemoglobin 11.4, platelets 234,000, potassium 4.4, BUN 24, creatinine 1.17.  SARS-CoV-2 test was negative. CT head without contrast was negative for acute intracranial abnormality, atrophy and small vessel ischemic changes of the white matter were noted. Portable chest x-ray showed a right-sided pacemaker in place with prior sternotomy changes.  No focal consolidation, effusion, or edema was seen. Bilateral hip x-ray showed acute left intertrochanteric  fracture.  On-call orthopedist, Dr. Erlinda Hong, was consulted by EDP and recommended medical admission at [facility] for likely surgical intervention tomorrow.  The hospitalist service was consulted to admit.  Hospital Course: Principal Problem Acute left intertrochanteric hip fracture -Orthopedic surgery consulted and followed patient while hospitalized.  He is status post intramedullary nail 6/26.  Postoperatively he recovered well, worked with physical therapy, initial recommendations were for inpatient rehab however per patient and family's preference would [facility].  His pain is controlled, and he will be placed on Lovenox for DVT prophylaxis.  He is to follow-up with orthopedic surgery as an outpatient in about 2 weeks.  Active Problems Acute blood loss anemia -expected, postop, hemoglobin dropped to 8.9 but recovering on its own and is 9.4 on the day of discharge.  Continue to monitor CBC periodically at SNF Dementia -Stable, without behavioral issues, continue home medications Coronary artery disease with history of CABG -No chest pain, this appears to be stable Permanent A. fib/history of heart block/pacemaker -Heart is paced, this is stable.  Has problems with GI bleeds as well as falls and not on anticoagulation.  Currently on Lovenox for prophylaxis Hypertension -continue home medications Hyperlipidemia -Continue atorvastatin BPH -Continue finasteride Constipation -postop, likely due to pain medications, resolved.  Continue bowel regimen at SNF  Discharge Diagnoses:  Principal Problem:   Closed intertrochanteric fracture of hip, left, initial encounter [facility]) Active Problems:   Permanent atrial fibrillation (HCC)   CHB (complete heart block) (HCC)   Hypercholesterolemia   HTN (hypertension)   Dementia ([place])   CAD (coronary  artery disease)   Discharge Instructions  Discharge Instructions    Weight bearing as tolerated   Complete by: As  directed      Allergies as of 12/29/2018      Reactions   Amiodarone Other (See Comments)   Had a severe lung infection in 2003   Antihistamines, Diphenhydramine-type Other (See Comments)   Jittery      Medication List    TAKE these medications   atorvastatin 40 MG tablet Commonly known as: LIPITOR Take 40 mg by mouth every evening.   donepezil 5 MG tablet Commonly known as: ARICEPT Take 1 tablet (5 mg total) by mouth 2 (two) times daily.   enoxaparin 40 MG/0.4ML injection Commonly known as: LOVENOX Inject 0.4 mLs (40 mg total) into the skin daily.   finasteride 5 MG tablet Commonly known as: PROSCAR Take 5 mg by mouth every evening.   multivitamin with minerals Tabs tablet Take 1 tablet by mouth daily.   oxyCODONE-acetaminophen 5-325 MG tablet Commonly known as: Percocet Take 1-2 tablets by mouth every 8 (eight) hours as needed for severe pain.   sertraline 25 MG tablet Commonly known as: ZOLOFT Take 25 mg by mouth daily.   Vitamin D 50 MCG (2000 UT) Caps Take 2,000 Units by mouth daily.            Discharge Care Instructions  (From admission, onward)         Start     Ordered   12/26/18 0000  Weight bearing as tolerated     12/26/18 1655         Follow-up Information    Leandrew Koyanagi, MD In 2 weeks.   Specialty: Orthopedic Surgery Why: For suture removal, For wound re-check Contact information: [place] [id number] [phone]           Consultations:  orthopedic surgery   Procedures/Studies: Dg Chest 1 View  Result Date: 12/25/2018 CLINICAL DATA:  Fall EXAM: CHEST  1 VIEW COMPARISON:  06/05/2018 FINDINGS: Post sternotomy changes. Right-sided pacing device as before. Low lung volumes. No acute opacity or pleural effusion. No pneumothorax. Old right fifth through seventh rib fractures. IMPRESSION: No active disease. Electronically Signed   By: Donavan Foil M.D.   On: 12/25/2018 21:03   Ct Head Wo  Contrast  Result Date: 12/25/2018 CLINICAL DATA:  Fall EXAM: CT HEAD WITHOUT CONTRAST TECHNIQUE: Contiguous axial images were obtained from the base of the skull through the vertex without intravenous contrast. COMPARISON:  CT brain 06/05/2018 FINDINGS: Brain: Mild motion degradation. No acute territorial infarction, hemorrhage or intracranial mass. Moderate to marked atrophy. Stable ventricle size. Mild small vessel ischemic changes of the white matter. Vascular: No hyperdense vessels. Vertebral and carotid vascular calcification. Skull: No fracture Sinuses/Orbits: Mild mucosal thickening in the ethmoid and sphenoid sinuses. Other: None IMPRESSION: 1. No CT evidence for acute intracranial abnormality. 2. Atrophy and small vessel ischemic changes of the white matter. Electronically Signed   By: Donavan Foil M.D.   On: 12/25/2018 21:08   Dg C-arm 1-60 Min  Result Date: 12/26/2018 CLINICAL DATA:  Left intertrochanteric femur fracture ORIF. EXAM: DG C-ARM 61-120 MIN; LEFT FEMUR 2 VIEWS COMPARISON:  Bilateral hip x-rays from yesterday. FLUOROSCOPY TIME:  1 minutes, 33 seconds. C-arm fluoroscopic images were obtained intraoperatively and submitted for post operative interpretation. FINDINGS: Multiple intraoperative fluoroscopic images demonstrate interval cephalomedullary rod fixation of the left intertrochanteric femur fracture. Alignment is anatomic. IMPRESSION: Intraoperative fluoroscopic guidance for left intertrochanteric  femur fracture ORIF. Electronically Signed   By: Titus Dubin M.D.   On: 12/26/2018 17:06   Dg Femur Min 2 Views Left  Result Date: 12/26/2018 CLINICAL DATA:  Left intertrochanteric femur fracture ORIF. EXAM: DG C-ARM 61-120 MIN; LEFT FEMUR 2 VIEWS COMPARISON:  Bilateral hip x-rays from yesterday. FLUOROSCOPY TIME:  1 minutes, 33 seconds. C-arm fluoroscopic images were obtained intraoperatively and submitted for post operative interpretation. FINDINGS: Multiple intraoperative  fluoroscopic images demonstrate interval cephalomedullary rod fixation of the left intertrochanteric femur fracture. Alignment is anatomic. IMPRESSION: Intraoperative fluoroscopic guidance for left intertrochanteric femur fracture ORIF. Electronically Signed   By: Titus Dubin M.D.   On: 12/26/2018 17:06   Dg Hips Bilat W Or Wo Pelvis 3-4 Views  Result Date: 12/25/2018 CLINICAL DATA:  Fall EXAM: DG HIP (WITH OR WITHOUT PELVIS) 3-4V BILAT COMPARISON:  None. FINDINGS: Vascular calcifications. Pubic symphysis and rami appear intact. Right hip shows no definitive fracture or dislocation. Suspected left intertrochanteric fracture. No left femoral head dislocation. IMPRESSION: 1. Acute left intertrochanteric fracture. 2. No acute osseous abnormality of the right hip Electronically Signed   By: Donavan Foil M.D.   On: 12/25/2018 21:02      Subjective: -Pleasantly confused, no complaints, denies pain, denies any shortness of breath  Discharge Exam: BP (!) 125/54    Pulse 60    Temp 99.4 F (37.4 C) (Oral)    Resp 16    Ht 6\' 3"  (1.905 m)    Wt 63.7 kg    SpO2 95%    BMI 17.55 kg/m   General: Pt is alert, awake, not in acute distress Cardiovascular: RRR, Respiratory: CTA bilaterally, no wheezing, no rhonchi Abdominal: Soft, NT, ND, bowel sounds + Extremities: no edema, no cyanosis    The results of significant diagnostics from this hospitalization (including imaging, microbiology, ancillary and laboratory) are listed below for reference.     Microbiology: Recent Results (from the past 240 hour(s))  SARS Coronavirus 2 (CEPHEID - Performed in [place] hospital lab), Hosp Order     Status: None   Collection Time: 12/25/18  9:47 PM   Specimen: Nasopharyngeal Swab  Result Value Ref Range Status   SARS Coronavirus 2 NEGATIVE NEGATIVE Final    Comment: (NOTE) If result is NEGATIVE SARS-CoV-2 target nucleic acids are NOT DETECTED. The SARS-CoV-2 RNA is generally detectable in upper and  lower  respiratory specimens during the acute phase of infection. The lowest  concentration of SARS-CoV-2 viral copies this assay can detect is 250  copies / mL. A negative result does not preclude SARS-CoV-2 infection  and should not be used as the sole basis for treatment or other  patient management decisions.  A negative result may occur with  improper specimen collection / handling, submission of specimen other  than nasopharyngeal swab, presence of viral mutation(s) within the  areas targeted by this assay, and inadequate number of viral copies  (<250 copies / mL). A negative result must be combined with clinical  observations, patient history, and epidemiological information. If result is POSITIVE SARS-CoV-2 target nucleic acids are DETECTED. The SARS-CoV-2 RNA is generally detectable in upper and lower  respiratory specimens dur ing the acute phase of infection.  Positive  results are indicative of active infection with SARS-CoV-2.  Clinical  correlation with patient history and other diagnostic information is  necessary to determine patient infection status.  Positive results do  not rule out bacterial infection or co-infection with other viruses. If result is PRESUMPTIVE  POSTIVE SARS-CoV-2 nucleic acids MAY BE PRESENT.   A presumptive positive result was obtained on the submitted specimen  and confirmed on repeat testing.  While 2019 novel coronavirus  (SARS-CoV-2) nucleic acids may be present in the submitted sample  additional confirmatory testing may be necessary for epidemiological  and / or clinical management purposes  to differentiate between  SARS-CoV-2 and other Sarbecovirus currently known to infect humans.  If clinically indicated additional testing with an alternate test  methodology [phone]) is advised. The SARS-CoV-2 RNA is generally  detectable in upper and lower respiratory sp ecimens during the acute  phase of infection. The expected result is  Negative. Fact Sheet for Patients:  StrictlyIdeas.no Fact Sheet for Healthcare Providers: BankingDealers.co.za This test is not yet approved or cleared by the [place] FDA and has been authorized for detection and/or diagnosis of SARS-CoV-2 by FDA under an Emergency Use Authorization (EUA).  This EUA will remain in effect (meaning this test can be used) for the duration of the COVID-19 declaration under Section 564(b)(1) of the Act, 21 U.S.C. section 360bbb-3(b)(1), unless the authorization is terminated or revoked sooner. Performed at [facility], [place] [address] [place], [place] [address]   Surgical PCR screen     Status: None   Collection Time: 12/26/18  3:43 AM   Specimen: Nasal Mucosa; Nasal Swab  Result Value Ref Range Status   MRSA, PCR NEGATIVE NEGATIVE Final   Staphylococcus aureus NEGATIVE NEGATIVE Final    Comment: (NOTE) The Xpert SA Assay (FDA approved for NASAL specimens in patients 14 years of age and older), is one component of a comprehensive surveillance program. It is not intended to diagnose infection nor to guide or monitor treatment. Performed at [facility], [place] [address] [place], [place] [address]      Labs: BNP (last 3 results) No results for input(s): BNP in the last 8760 hours. Basic Metabolic Panel: Recent Labs  Lab 12/25/18 2106 12/26/18 1859 12/27/18 0457 12/28/18 0411 12/29/18 0434  NA 138  --  136 135 137  K 4.4  --  4.4 4.1 4.3  CL 103  --  103 104 104  CO2 27  --  26 23 23   GLUCOSE 95  --  148* 88 97  BUN 24*  --  20 31* 30*  CREATININE 1.17 1.28* 1.17 1.24 1.12  CALCIUM 9.1  --  8.5* 8.5* 8.6*   Liver Function Tests: Recent Labs  Lab 12/25/18 2106  AST 29  ALT 17  ALKPHOS 99  BILITOT 0.2*  PROT 7.6  ALBUMIN 3.1*   No results for input(s): LIPASE, AMYLASE in the last 168 hours. No results for input(s): AMMONIA in the last 168  hours. CBC: Recent Labs  Lab 12/25/18 2106 12/26/18 1859 12/27/18 0457 12/28/18 0411 12/29/18 0434  WBC 9.8 12.5* 10.1 9.1 8.8  HGB 11.4* 10.9* 9.9* 8.9* 9.4*  HCT 36.6* 34.6* 31.2* 28.5* 29.2*  MCV 96.1 93.8 93.7 92.5 91.5  PLT 234 218 194 191 213   Cardiac Enzymes: No results for input(s): CKTOTAL, CKMB, CKMBINDEX, TROPONINI in the last 168 hours. BNP: Invalid input(s): POCBNP CBG: No results for input(s): GLUCAP in the last 168 hours. D-Dimer No results for input(s): DDIMER in the last 72 hours. Hgb A1c No results for input(s): HGBA1C in the last 72 hours. Lipid Profile No results for input(s): CHOL, HDL, LDLCALC, TRIG, CHOLHDL, LDLDIRECT in the last 72 hours. Thyroid function studies No results for input(s): TSH, T4TOTAL,  T3FREE, THYROIDAB in the last 72 hours.  Invalid input(s): FREET3 Anemia work up No results for input(s): VITAMINB12, FOLATE, FERRITIN, TIBC, IRON, RETICCTPCT in the last 72 hours. Urinalysis    Component Value Date/Time   COLORURINE YELLOW 11/21/2009 1617   APPEARANCEUR CLEAR 11/21/2009 1617   LABSPEC 1.017 11/21/2009 1617   PHURINE 6.0 11/21/2009 1617   GLUCOSEU NEGATIVE 11/21/2009 1617   HGBUR SMALL (A) 11/21/2009 1617   BILIRUBINUR NEGATIVE 11/21/2009 1617   KETONESUR NEGATIVE 11/21/2009 1617   PROTEINUR NEGATIVE 11/21/2009 1617   UROBILINOGEN 0.2 11/21/2009 1617   NITRITE NEGATIVE 11/21/2009 1617   LEUKOCYTESUR NEGATIVE 11/21/2009 1617   Sepsis Labs Invalid input(s): PROCALCITONIN,  WBC,  LACTICIDVEN  FURTHER DISCHARGE INSTRUCTIONS:   Get Medicines reviewed and adjusted: Please take all your medications with you for your next visit with your Primary MD   Laboratory/radiological data: Please request your Primary MD to go over all hospital tests and procedure/radiological results at the follow up, please ask your Primary MD to get all Hospital records sent to his/her office.   In some cases, they will be blood work, cultures and  biopsy results pending at the time of your discharge. Please request that your primary care M.D. goes through all the records of your hospital data and follows up on these results.   Also Note the following: If you experience worsening of your admission symptoms, develop shortness of breath, life threatening emergency, suicidal or homicidal thoughts you must seek medical attention immediately by calling 911 or calling your MD immediately  if symptoms less severe.   You must read complete instructions/literature along with all the possible adverse reactions/side effects for all the Medicines you take and that have been prescribed to you. Take any new Medicines after you have completely understood and accpet all the possible adverse reactions/side effects.    Do not drive when taking Pain medications or sleeping medications (Benzodaizepines)   Do not take more than prescribed Pain, Sleep and Anxiety Medications. It is not advisable to combine anxiety,sleep and pain medications without talking with your primary care practitioner   Special Instructions: If you have smoked or chewed Tobacco  in the last 2 yrs please stop smoking, stop any regular Alcohol  and or any Recreational drug use.   Wear Seat belts while driving.   Please note: You were cared for by a hospitalist during your hospital stay. Once you are discharged, your primary care physician will handle any further medical issues. Please note that NO REFILLS for any discharge medications will be authorized once you are discharged, as it is imperative that you return to your primary care physician (or establish a relationship with a primary care physician if you do not have one) for your post hospital discharge needs so that they can reassess your need for medications and monitor your lab values.  Time coordinating discharge: 35 minutes  SIGNED:  Marzetta Board, MD, PhD 12/29/2018, 10:57 AM

## 2018-12-29 NOTE — Plan of Care (Signed)
  Problem: Clinical Measurements: Goal: Ability to maintain clinical measurements within normal limits will improve Outcome: Progressing   Problem: Activity: Goal: Risk for activity intolerance will decrease Outcome: Progressing   Problem: Nutrition: Goal: Adequate nutrition will be maintained Outcome: Progressing   Problem: Elimination: Goal: Will not experience complications related to bowel motility Outcome: Progressing   Problem: Pain Managment: Goal: General experience of comfort will improve Outcome: Progressing   Problem: Skin Integrity: Goal: Risk for impaired skin integrity will decrease Outcome: Progressing

## 2018-12-29 NOTE — Progress Notes (Addendum)
Physical Therapy Treatment Patient Details Name: [name] [name] MRN: [id number] DOB: 1925/12/17 Today's Date: 12/29/2018    History of Present Illness Pt is a 85 y.o. male admitted 12/25/18 after fall sustaining L intertrochanteric hip fx. Now s/p L femoral IM nail 12/26/18. PMH includes CAD, pacemaker, afib (not on anticoag due to h/o falls), HTN, dementia.    PT Comments    Pt supine in bed upon arrival with wife present for initial portion of session. Per pt's wife, Pt will be going to SNF after d/c.  Pt seen for mobility progression.  Required mod A for bed mobility, min A for transfers (with elevated bed) and min A for short gait; as well as increased time.  Pt requires frequent cues for task but is easily redirected. Pt will benefit from continued skilled PT intervention to maximize functional mobility and safety.     Follow Up Recommendations  Supervision/Assistance - 24 hour;SNF(pt's family request for [facility], decline CIR)     Equipment Recommendations  (TBD)    Recommendations for Other Services       Precautions / Restrictions Precautions Precautions: Fall Restrictions Weight Bearing Restrictions: No LLE Weight Bearing: Weight bearing as tolerated    Mobility  Bed Mobility Overal bed mobility: Needs Assistance Bed Mobility: Supine to Sit;Sit to Supine     Supine to sit: Mod assist;HOB elevated Sit to supine: Mod assist(physical assist for LLE into bed)   General bed mobility comments: ModA to assist LLE and hips to EOB, cues for UE support for trunk elevation; required increased time and repeated cues for posture at EOB (tends to lean back into Rt elbow). Pt able to side and posterior scoot well in the bed with cues on direction.   Transfers Overall transfer level: Needs assistance Equipment used: Rolling walker (2 wheeled) Transfers: Sit to/from Stand Sit to Stand: Min assist;Mod assist(physical assist to power up; bed elevated)         General  transfer comment: MinA for trunk elevation and to steady; tactile cues for safe hand placement with RW. Poor eccentric control into sitting due to painful LLE  Ambulation/Gait Ambulation/Gait assistance: Min assist(assist with RW and to steady) Gait Distance (Feet): 3 Feet(side stepping to [facility]) Assistive device: Rolling walker (2 wheeled) Gait Pattern/deviations: Step-to pattern;Decreased weight shift to left;Trunk flexed;Antalgic;Shuffle Gait velocity: Decreased   General Gait Details: Slow, antalgic steps with RW and min A for balance and to progress RW.  Pt pivots Rt foot instead of lifting it to progress; cues for sequence.    Stairs             Wheelchair Mobility    Modified Rankin (Stroke Patients Only)       Balance Overall balance assessment: Needs assistance   Sitting balance-Leahy Scale: Fair       Standing balance-Leahy Scale: Poor Standing balance comment: Reliant on UE support                            Cognition Arousal/Alertness: Awake/alert Behavior During Therapy: WFL for tasks assessed/performed Overall Cognitive Status: History of cognitive impairments - at baseline Area of Impairment: Orientation;Attention;Memory;Following commands;Safety/judgement;Awareness;Problem solving                 Orientation Level: Disoriented to;Time;Situation Current Attention Level: Sustained Memory: Decreased short-term memory Following Commands: Follows one step commands with increased time Safety/Judgement: Decreased awareness of deficits;Decreased awareness of safety Awareness: Intellectual Problem Solving: Requires verbal  cues General Comments: Poor short-term memory, requiring frequent reorientation and redirection to task. Able to state wife's name. Very pleasant. Wife confirms pt near baseline cognition and gets more confused in unfamiliar environment      Exercises      General Comments General comments (skin integrity, edema,  etc.): Pt with poor short-term memory requiring frequent redirection to task      Pertinent Vitals/Pain Pain Assessment: Faces Faces Pain Scale: Hurts little more Pain Location: L hip Pain Descriptors / Indicators: Grimacing;Guarding Pain Intervention(s): Monitored during session;Limited activity within patient's tolerance;Repositioned    Home Living                      Prior Function            PT Goals (current goals can now be found in the care plan section) Acute Rehab PT Goals Patient Stated Goal: Wife agreeable to rehab before pt returns home PT Goal Formulation: With patient/family Time For Goal Achievement: 01/10/19 Potential to Achieve Goals: Good    Frequency    Min 5X/week      PT Plan Discharge plan needs to be updated    Co-evaluation PT/OT/SLP Co-Evaluation/Treatment: Yes   PT goals addressed during session: Mobility/safety with mobility        AM-PAC PT "6 Clicks" Mobility   Outcome Measure  Help needed turning from your back to your side while in a flat bed without using bedrails?: A Lot Help needed moving from lying on your back to sitting on the side of a flat bed without using bedrails?: A Lot Help needed moving to and from a bed to a chair (including a wheelchair)?: A Little Help needed standing up from a chair using your arms (e.g., wheelchair or bedside chair)?: A Little Help needed to walk in hospital room?: A Little Help needed climbing 3-5 steps with a railing? : A Lot 6 Click Score: 15    End of Session Equipment Utilized During Treatment: Gait belt Activity Tolerance: Patient tolerated treatment well Patient left: with call bell/phone within reach;in bed;with bed alarm set Nurse Communication: Mobility status PT Visit Diagnosis: Other abnormalities of gait and mobility (R26.89);Muscle weakness (generalized) (M62.81)     Time: 0254-2706 PT Time Calculation (min) (ACUTE ONLY): 23 min  Charges:  $Therapeutic Activity:  23-37 mins                    Kerin Perna, PTA Acute Rehab [phone]    Shelbie Hutching 12/29/2018, 8:49 AM

## 2018-12-29 NOTE — TOC Initial Note (Signed)
Transition of Care [facility]) - Initial/Assessment Note    Patient Details  Name: [name] [name] MRN: [id number] Date of Birth: 1926-05-14  Transition of Care [facility]) CM/SW Contact:    Alberteen Sam, [place] Phone Number: [phone] 12/29/2018, 9:54 AM  Clinical Narrative:                  CSW spoke with patient's wife [name] due to patient's dementia. CSW informed [name] of PT recommendations of patient going a [facility] for short term rehab at discharge. [name] reports she is in agreement and would like patient to go to [facility] in which she has been in contact with social worker there Joellen Jersey who can accept patient today. CSW has faxed clinicals to [facility] and lvm with Joellen Jersey, awaiting call back to confirm they can accept patient today. [name] reports no further questions or concerns at this time.   Expected Discharge Plan: Skilled Nursing Facility Barriers to Discharge: Continued Medical Work up   Patient Goals and CMS Choice Patient states their goals for this hospitalization and ongoing recovery are:: family would like patient to go to short term rehab after hospital CMS Medicare.gov Compare Post Acute Care list provided to:: Patient Represenative (must comment)([name] (spouse)) Choice offered to / list presented to : Spouse([name])  Expected Discharge Plan and Services Expected Discharge Plan: [place] Acute Care Choice: [place] Living arrangements for the past 2 months: Single Family Home                                      Prior Living Arrangements/Services Living arrangements for the past 2 months: Single Family Home Lives with:: Spouse Patient language and need for interpreter reviewed:: Yes Do you feel safe going back to the place where you live?: No   needs short term rehab before returning home  Need for Family Participation in Patient Care: Yes (Comment) Care giver  support system in place?: Yes (comment)   Criminal Activity/Legal Involvement Pertinent to Current Situation/Hospitalization: No - Comment as needed  Activities of Daily Living Home Assistive Devices/Equipment: None ADL Screening (condition at time of admission) Patient's cognitive ability adequate to safely complete daily activities?: Yes Is the patient deaf or have difficulty hearing?: No Does the patient have difficulty seeing, even when wearing glasses/contacts?: No Does the patient have difficulty concentrating, remembering, or making decisions?: Yes Patient able to express need for assistance with ADLs?: Yes Does the patient have difficulty dressing or bathing?: Yes Independently performs ADLs?: Yes (appropriate for developmental age) Does the patient have difficulty walking or climbing stairs?: Yes Weakness of Legs: Left Weakness of Arms/Hands: None  Permission Sought/Granted Permission sought to share information with : Case Manager, Family Supports, Chartered certified accountant granted to share information with : Yes, Verbal Permission Granted  Share Information with NAME: [name]  Permission granted to share info w AGENCY: SNFs  Permission granted to share info w Relationship: spouse  Permission granted to share info w Contact Information: [phone]  Emotional Assessment Appearance:: Other (Comment Required(unable to assess) Attitude/Demeanor/Rapport: Unable to Assess Affect (typically observed): Unable to Assess Orientation: : Oriented to Self(dementia) Alcohol / Substance Use: Not Applicable Psych Involvement: No (comment)  Admission diagnosis:  Closed left hip fracture, initial encounter [facility]) [S72.002A] Patient Active Problem List   Diagnosis Date Noted  . Closed displaced intertrochanteric fracture of  left femur ([place]) 12/25/2018  . Closed intertrochanteric fracture of hip, left, initial encounter ([place]) 12/25/2018  . Pain due to onychomycosis of  toenails of both feet 12/12/2018  . Acute blood loss anemia 06/08/2018  . GI bleed 06/05/2018  . HTN (hypertension)   . High cholesterol   . Diverticulosis   . Dementia ([place])   . CAD (coronary artery disease)   . Lower GI bleed   . AAA (abdominal aortic aneurysm) without rupture ([place]) 01/09/2018  . Ascending aortic aneurysm ([place]) 01/09/2018  . Heart block AV second degree 07/15/2017  . Dementia arising in the senium and presenium ([place]) 07/15/2017  . Status post three vessel coronary artery bypass 07/15/2017  . Left ventricular systolic dysfunction without heart failure 05/11/2017  . Hypercholesterolemia 05/11/2017  . Long term current use of anticoagulant 08/02/2016  . CHB (complete heart block) ([place]) 06/07/2015  . Alzheimer's disease ([place]) 01/26/2015  . Dementia with behavioral disturbance ([place]) 07/28/2013  . Pacemaker 01/26/2013  . Permanent atrial fibrillation ([place]) 01/26/2013  . Second degree AV block, Mobitz type II 01/26/2013  . Coronary artery disease involving native coronary artery of native heart without angina pectoris 01/26/2013  . Dyspnea 01/26/2013   PCP:  [name], MD Pharmacy:   [facility] [place] [address] [place] [address] Phone: [phone] Fax: [phone]     Social Determinants of Health (SDOH) Interventions    Readmission Risk Interventions No flowsheet data found.

## 2018-12-29 NOTE — Progress Notes (Signed)
Inpatient Rehabilitation Admissions Coordinator  I met with patient and his wife at bedside and she confirms that they prefer SNF at [facility] . We will sign off at this time.  Danne Baxter, RN, MSN Rehab Admissions Coordinator [phone] 12/29/2018 10:20 AM

## 2018-12-29 NOTE — Progress Notes (Signed)
Attempted to give report x2, no answer, left callback number to voicemail.

## 2018-12-29 NOTE — Progress Notes (Signed)
Gave report to nurse [place], all questions and concerns addressed, Pt not in distress, discharged to facility via PTAR.

## 2018-12-30 ENCOUNTER — Non-Acute Institutional Stay (SKILLED_NURSING_FACILITY): Payer: Medicare Other | Admitting: Internal Medicine

## 2018-12-30 ENCOUNTER — Encounter: Payer: Self-pay | Admitting: Internal Medicine

## 2018-12-30 DIAGNOSIS — D62 Acute posthemorrhagic anemia: Secondary | ICD-10-CM

## 2018-12-30 DIAGNOSIS — I4821 Permanent atrial fibrillation: Secondary | ICD-10-CM | POA: Diagnosis not present

## 2018-12-30 DIAGNOSIS — E7800 Pure hypercholesterolemia, unspecified: Secondary | ICD-10-CM

## 2018-12-30 DIAGNOSIS — S72142S Displaced intertrochanteric fracture of left femur, sequela: Secondary | ICD-10-CM

## 2018-12-30 NOTE — Progress Notes (Signed)
Provider:Dorene Bruni Rene Kocher, MD   Location:  [facility] Room Number: [phone] Place of Service:  SNF (31)  PCP: [name], MD Patient Care Team: [name], MD as PCP - General (Internal Medicine) Croitoru, Dani Gobble, MD as PCP - Cardiology (Cardiology) Virgie Dad, MD as Consulting Physician (Internal Medicine)  Extended Emergency Contact Information Primary Emergency Contact: Marnette Burgess Address: [address]          [place] [address] [place] Phone: [phone] Mobile Phone: [phone] Relation: Spouse Secondary Emergency Contact: Wildon, [place] Mobile Phone: [phone] Relation: Son  Code Status:DNR Goals of Care: Advanced Directive information Advanced Directives 12/30/2018  Does Patient Have a Medical Advance Directive? Yes  Type of Advance Directive Out of facility DNR (pink MOST or yellow form)  Does patient want to make changes to medical advance directive? No - Patient declined  Copy of [place] in Chart? -  Would patient like information on creating a medical advance directive? No - Patient declined  Pre-existing out of facility DNR order (yellow form or pink MOST form) Yellow form placed in chart (order not valid for inpatient use)      Chief Complaint  Patient presents with   New Admit To SNF    new admit to facility     HPI: Patient is a 85 y.o. male seen today for admission to SNF for therapy . He was in the hospital from 6 /25-6 /29 for left intertrochanteric hip fracture  Patient has a history of CAD s/p CABG, CHB s/p PPM, PAF not on any coagulation due to history of GI bleeds and fall, hypertension, hyperlipidemia, history of abdominal aortic aneurysm, cognitive impairment Patient fell when he was walking out with his wife.  Did not lose any consciousness Found to have acute left intertrochanteric fracture.  Had intramedullary nail on 6/26. Postop course was complicated  with anemia.  Otherwise patient was stable to be discharged for therapy to SNF.  He is WBAT.  And is on Lovenox for DVT prophylaxis Patient did not have any acute complaints today.  His pain seems to be controlled.  He denies any chest pain, shortness of breath, coughing.  His bowels are moving and he is eating well  Patient lives with his wife and was using cane and was independent in his ADLs  Past Medical History:  Diagnosis Date   Atrial fibrillation, permanent    CAD (coronary artery disease)    Dementia ([place])    Diverticulosis    diverticular bleed 2011   High cholesterol    HTN (hypertension)    has improved over time, no rx as of 2019   Lower GI bleed    Past Surgical History:  Procedure Laterality Date   angiolplasty     APPENDECTOMY     INSERT / REPLACE / REMOVE PACEMAKER     INTRAMEDULLARY (IM) NAIL INTERTROCHANTERIC Left 12/26/2018   Procedure: INTRAMEDULLARY (IM) NAIL INTERTROCHANTRIC;  Surgeon: Leandrew Koyanagi, MD;  Location: [facility];  Service: Orthopedics;  Laterality: Left;   KNEE SURGERY     PILONIDAL CYST EXCISION     rotator cuff surgery  1985   TONSILLECTOMY     triple heart bypass      reports that he quit smoking about 44 years ago. He has never used smokeless tobacco. He reports previous alcohol use. He reports that he does not use drugs. Social History   Socioeconomic History   Marital status: Married  Spouse name: [name]   Number of children: 6   Years of education: college   Highest education level: Not on file  Occupational History   Occupation: retired  Scientist, product/process development strain: Not on file   Food insecurity    Worry: Not on file    Inability: Not on file   Transportation needs    Medical: Not on file    Non-medical: Not on file  Tobacco Use   Smoking status: Former Smoker    Quit date: 07/02/1974    Years since quitting: 44.5   Smokeless tobacco: Never Used  Substance and Sexual Activity    Alcohol use: Not Currently    Comment: last used in dec 2019   Drug use: No   Sexual activity: Not on file  Lifestyle   Physical activity    Days per week: Not on file    Minutes per session: Not on file   Stress: Not on file  Relationships   Social connections    Talks on phone: Not on file    Gets together: Not on file    Attends religious service: Not on file    Active member of club or organization: Not on file    Attends meetings of clubs or organizations: Not on file    Relationship status: Not on file   Intimate partner violence    Fear of current or ex partner: Not on file    Emotionally abused: Not on file    Physically abused: Not on file    Forced sexual activity: Not on file  Other Topics Concern   Not on file  Social History Narrative   Patient is married [name]) and lives at home with his wife.   Patient has a college education.   Patient is a retired Materials engineer.   Patient does not drink any caffeine.   Patient has six children.    Functional Status Survey:    Family History  Problem Relation Age of Onset   Stroke Father    Cervical cancer Other        siblings   COPD Sister    Dementia Brother     Health Maintenance  Topic Date Due   TETANUS/TDAP  08/22/1946   PNA vac Low Risk Adult (1 of 2 - PCV13) 08/22/1992   INFLUENZA VACCINE  01/31/2019    Allergies  Allergen Reactions   Amiodarone Other (See Comments)    Had a severe lung infection in 2003   Antihistamines, Diphenhydramine-Type Other (See Comments)    Jittery    Outpatient Encounter Medications as of 12/30/2018  Medication Sig   atorvastatin (LIPITOR) 40 MG tablet Take 40 mg by mouth every evening.    Cholecalciferol (VITAMIN D) 2000 UNITS CAPS Take 2,000 Units by mouth daily.    donepezil (ARICEPT) 5 MG tablet Take 1 tablet (5 mg total) by mouth 2 (two) times daily.   enoxaparin (LOVENOX) 40 MG/0.4ML injection Inject 0.4 mLs (40 mg total) into  the skin daily.   ferrous sulfate 325 (65 FE) MG EC tablet Take 325 mg by mouth daily with breakfast. Once a day on Mon,Wed,Fri   finasteride (PROSCAR) 5 MG tablet Take 5 mg by mouth every evening.    Multiple Vitamin (MULTIVITAMIN WITH MINERALS) TABS tablet Take 1 tablet by mouth daily.   oxyCODONE-acetaminophen (PERCOCET) 5-325 MG tablet Take 1 tablet by mouth every 6 (six) hours as needed for severe pain.   sertraline (ZOLOFT)  25 MG tablet Take 25 mg by mouth daily.   No facility-administered encounter medications on file as of 12/30/2018.     Review of Systems  Constitutional: Negative.   HENT: Negative.   Respiratory: Negative.   Cardiovascular: Negative.   Gastrointestinal: Negative.   Genitourinary: Negative.   Musculoskeletal: Positive for gait problem.  Skin: Negative.   Neurological: Positive for weakness.  Psychiatric/Behavioral: Positive for confusion.    Vitals:   12/30/18 0840  BP: 132/74  Pulse: 72  Resp: 20  Temp: 98.2 F (36.8 C)  SpO2: 92%   There is no height or weight on file to calculate BMI. Physical Exam Vitals signs reviewed.  HENT:     Head: Normocephalic.     Nose: Nose normal.     Mouth/Throat:     Mouth: Mucous membranes are moist.     Pharynx: Oropharynx is clear.  Eyes:     Pupils: Pupils are equal, round, and reactive to light.  Neck:     Musculoskeletal: Neck supple.  Cardiovascular:     Rate and Rhythm: Normal rate and regular rhythm.     Pulses: Normal pulses.  Pulmonary:     Effort: Pulmonary effort is normal. No respiratory distress.     Breath sounds: Normal breath sounds. No wheezing or rales.  Abdominal:     General: Abdomen is flat. Bowel sounds are normal.     Palpations: Abdomen is soft.  Musculoskeletal:        General: No swelling.  Skin:    General: Skin is warm and dry.  Neurological:     General: No focal deficit present.     Mental Status: He is alert.     Comments: Was not oriented to time Did not know  his age or Date of year   Psychiatric:        Mood and Affect: Mood normal.        Thought Content: Thought content normal.        Judgment: Judgment normal.     Labs reviewed: Basic Metabolic Panel: Recent Labs    12/27/18 0457 12/28/18 0411 12/29/18 0434  NA 136 135 137  K 4.4 4.1 4.3  CL 103 104 104  CO2 26 23 23   GLUCOSE 148* 88 97  BUN 20 31* 30*  CREATININE 1.17 1.24 1.12  CALCIUM 8.5* 8.5* 8.6*   Liver Function Tests: Recent Labs    06/05/18 0339 06/08/18 0012 12/25/18 2106  AST 30 44* 29  ALT 15 20 17   ALKPHOS 94 70 99  BILITOT 0.6 0.6 0.2*  PROT 6.9 5.6* 7.6  ALBUMIN 2.6* 2.3* 3.1*   No results for input(s): LIPASE, AMYLASE in the last 8760 hours. No results for input(s): AMMONIA in the last 8760 hours. CBC: Recent Labs    06/05/18 0339  06/08/18 0012 06/08/18 0848  12/27/18 0457 12/28/18 0411 12/29/18 0434  WBC 12.8*   < > 8.4 4.8   < > 10.1 9.1 8.8  NEUTROABS 8.2*  --  6.3 2.8  --   --   --   --   HGB 10.6*   < > 7.8* 8.0*   < > 9.9* 8.9* 9.4*  HCT 34.7*   < > 25.0* 25.3*   < > 31.2* 28.5* 29.2*  MCV 102.1*   < > 100.0 98.4   < > 93.7 92.5 91.5  PLT 216   < > 183 150   < > 194 191 213   < > =  values in this interval not displayed.   Cardiac Enzymes: No results for input(s): CKTOTAL, CKMB, CKMBINDEX, TROPONINI in the last 8760 hours. BNP: Invalid input(s): POCBNP Lab Results  Component Value Date   HGBA1C  12/13/2008    5.5 (NOTE) The ADA recommends the following therapeutic goal for glycemic control related to Hgb A1c measurement: Goal of therapy: <6.5 Hgb A1c  Reference: American Diabetes Association: Clinical Practice Recommendations 2010, Diabetes Care, 2010, 33: (Suppl  1).   No results found for: TSH No results found for: VITAMINB12 No results found for: FOLATE No results found for: IRON, TIBC, FERRITIN  Imaging and Procedures obtained prior to SNF admission: Dg Chest 1 View  Result Date: 12/25/2018 CLINICAL DATA:  Fall EXAM:  CHEST  1 VIEW COMPARISON:  06/05/2018 FINDINGS: Post sternotomy changes. Right-sided pacing device as before. Low lung volumes. No acute opacity or pleural effusion. No pneumothorax. Old right fifth through seventh rib fractures. IMPRESSION: No active disease. Electronically Signed   By: Donavan Foil M.D.   On: 12/25/2018 21:03   Ct Head Wo Contrast  Result Date: 12/25/2018 CLINICAL DATA:  Fall EXAM: CT HEAD WITHOUT CONTRAST TECHNIQUE: Contiguous axial images were obtained from the base of the skull through the vertex without intravenous contrast. COMPARISON:  CT brain 06/05/2018 FINDINGS: Brain: Mild motion degradation. No acute territorial infarction, hemorrhage or intracranial mass. Moderate to marked atrophy. Stable ventricle size. Mild small vessel ischemic changes of the white matter. Vascular: No hyperdense vessels. Vertebral and carotid vascular calcification. Skull: No fracture Sinuses/Orbits: Mild mucosal thickening in the ethmoid and sphenoid sinuses. Other: None IMPRESSION: 1. No CT evidence for acute intracranial abnormality. 2. Atrophy and small vessel ischemic changes of the white matter. Electronically Signed   By: Donavan Foil M.D.   On: 12/25/2018 21:08   Dg C-arm 1-60 Min  Result Date: 12/26/2018 CLINICAL DATA:  Left intertrochanteric femur fracture ORIF. EXAM: DG C-ARM 61-120 MIN; LEFT FEMUR 2 VIEWS COMPARISON:  Bilateral hip x-rays from yesterday. FLUOROSCOPY TIME:  1 minutes, 33 seconds. C-arm fluoroscopic images were obtained intraoperatively and submitted for post operative interpretation. FINDINGS: Multiple intraoperative fluoroscopic images demonstrate interval cephalomedullary rod fixation of the left intertrochanteric femur fracture. Alignment is anatomic. IMPRESSION: Intraoperative fluoroscopic guidance for left intertrochanteric femur fracture ORIF. Electronically Signed   By: Titus Dubin M.D.   On: 12/26/2018 17:06   Dg Femur Min 2 Views Left  Result Date:  12/26/2018 CLINICAL DATA:  Left intertrochanteric femur fracture ORIF. EXAM: DG C-ARM 61-120 MIN; LEFT FEMUR 2 VIEWS COMPARISON:  Bilateral hip x-rays from yesterday. FLUOROSCOPY TIME:  1 minutes, 33 seconds. C-arm fluoroscopic images were obtained intraoperatively and submitted for post operative interpretation. FINDINGS: Multiple intraoperative fluoroscopic images demonstrate interval cephalomedullary rod fixation of the left intertrochanteric femur fracture. Alignment is anatomic. IMPRESSION: Intraoperative fluoroscopic guidance for left intertrochanteric femur fracture ORIF. Electronically Signed   By: Titus Dubin M.D.   On: 12/26/2018 17:06   Dg Hips Bilat W Or Wo Pelvis 3-4 Views  Result Date: 12/25/2018 CLINICAL DATA:  Fall EXAM: DG HIP (WITH OR WITHOUT PELVIS) 3-4V BILAT COMPARISON:  None. FINDINGS: Vascular calcifications. Pubic symphysis and rami appear intact. Right hip shows no definitive fracture or dislocation. Suspected left intertrochanteric fracture. No left femoral head dislocation. IMPRESSION: 1. Acute left intertrochanteric fracture. 2. No acute osseous abnormality of the right hip Electronically Signed   By: Donavan Foil M.D.   On: 12/25/2018 21:02    Assessment/Plan Permanent atrial fibrillation ([place]) - Plan:  Rate control No On Any anticoagulation  S/P ORIF for Left Hip Fracture WBAT On Lovenox for 2 weeks Has Appointment with Ortho Pain seems controlled   Hyperlipidemia- Plan:  On Lipitor  Acute blood loss anemia - Plan:  Started on Iron Repeat CBC Dementia Continue on Aricept Depression On Zoloft   Family/ staff Communication:   Labs/tests ordered: CBC and BMP in 1 week  Total time spent in this patient care encounter was  45_  minutes; greater than 50% of the visit spent counseling patient and staff, reviewing records , Labs and coordinating care for problems addressed at this encounter.

## 2019-01-01 ENCOUNTER — Encounter: Payer: Self-pay | Admitting: Nurse Practitioner

## 2019-01-01 ENCOUNTER — Non-Acute Institutional Stay (SKILLED_NURSING_FACILITY): Payer: Medicare Other | Admitting: Nurse Practitioner

## 2019-01-01 DIAGNOSIS — S72142S Displaced intertrochanteric fracture of left femur, sequela: Secondary | ICD-10-CM | POA: Diagnosis not present

## 2019-01-01 DIAGNOSIS — D62 Acute posthemorrhagic anemia: Secondary | ICD-10-CM | POA: Diagnosis not present

## 2019-01-01 DIAGNOSIS — F0281 Dementia in other diseases classified elsewhere with behavioral disturbance: Secondary | ICD-10-CM | POA: Diagnosis not present

## 2019-01-01 DIAGNOSIS — B379 Candidiasis, unspecified: Secondary | ICD-10-CM | POA: Diagnosis not present

## 2019-01-01 DIAGNOSIS — R3915 Urgency of urination: Secondary | ICD-10-CM | POA: Diagnosis not present

## 2019-01-01 DIAGNOSIS — G301 Alzheimer's disease with late onset: Secondary | ICD-10-CM | POA: Diagnosis not present

## 2019-01-01 NOTE — Progress Notes (Signed)
Location:   [facility] Room Number: 174 Place of Service:  SNF (31) Provider: Lennie Odor Mast NP  [name], MD  Patient Care Team: [name], MD as PCP - General (Internal Medicine) [name], MD as PCP - Cardiology (Cardiology) Virgie Dad, MD as Consulting Physician (Internal Medicine)  Extended Emergency Contact Information Primary Emergency Contact: Marnette Burgess Address: [place] [address] [address] Phone: [phone] Mobile Phone: [phone] Relation: Spouse Secondary Emergency Contact: Anthem, [place] Mobile Phone: [phone] Relation: Son  Code Status: DNR Goals of care: Advanced Directive information Advanced Directives 01/01/2019  Does Patient Have a Medical Advance Directive? Yes  Type of Advance Directive Out of facility DNR (pink MOST or yellow form)  Does patient want to make changes to medical advance directive? No - Patient declined  Copy of [place] in Chart? -  Would patient like information on creating a medical advance directive? No - Patient declined  Pre-existing out of facility DNR order (yellow form or pink MOST form) Yellow form placed in chart (order not valid for inpatient use)     Chief Complaint  Patient presents with  . Acute Visit    urinary frequency    HPI:  Pt is a 85 y.o. male seen today for an acute visit for reported urinary urgency, frequency, observed dark/?hematuria on adult brief. He denied dysuria, abd pain, CVA tenderness, nausea, vomiting, constipation or diarrhea. He is afebrile. HPI was provided with assistance of staff. Hx urinary frequency, on Finasteride 5mg  qd. HPI was provided with assistance of staff, on Donepezil 5mg  bid for memory. Post Op left hip ORIF 12/26/18, on Lovenox 40mg  qd, blood lost anemia, on Fe 325mg  qd, last Hgb 9.4 12/29/18.    Past Medical History:  Diagnosis Date  . Atrial fibrillation, permanent   .  CAD (coronary artery disease)   . Dementia ([place])   . Diverticulosis    diverticular bleed 2011  . High cholesterol   . HTN (hypertension)    has improved over time, no rx as of 2019  . Lower GI bleed    Past Surgical History:  Procedure Laterality Date  . angiolplasty    . APPENDECTOMY    . INSERT / REPLACE / REMOVE PACEMAKER    . INTRAMEDULLARY (IM) NAIL INTERTROCHANTERIC Left 12/26/2018   Procedure: INTRAMEDULLARY (IM) NAIL INTERTROCHANTRIC;  Surgeon: Leandrew Koyanagi, MD;  Location: [facility];  Service: Orthopedics;  Laterality: Left;  . KNEE SURGERY    . PILONIDAL CYST EXCISION    . rotator cuff surgery  1985  . TONSILLECTOMY    . triple heart bypass      Allergies  Allergen Reactions  . Amiodarone Other (See Comments)    Had a severe lung infection in 2003  . Antihistamines, Diphenhydramine-Type Other (See Comments)    Jittery    Allergies as of 01/01/2019      Reactions   Amiodarone Other (See Comments)   Had a severe lung infection in 2003   Antihistamines, Diphenhydramine-type Other (See Comments)   Jittery      Medication List       Accurate as of January 01, 2019  3:27 PM. If you have any questions, ask your nurse or doctor.        AMINO ACIDS-PROTEIN HYDROLYS PO Take 30 mLs by mouth daily.   atorvastatin 40 MG tablet Commonly known as: LIPITOR Take 40 mg  by mouth every evening.   donepezil 5 MG tablet Commonly known as: ARICEPT Take 1 tablet (5 mg total) by mouth 2 (two) times daily.   enoxaparin 40 MG/0.4ML injection Commonly known as: LOVENOX Inject 0.4 mLs (40 mg total) into the skin daily.   ferrous sulfate 325 (65 FE) MG EC tablet Take 325 mg by mouth daily with breakfast. Once a day on Mon,Wed,Fri   finasteride 5 MG tablet Commonly known as: PROSCAR Take 5 mg by mouth every evening.   multivitamin with minerals Tabs tablet Take 1 tablet by mouth daily.   oxyCODONE-acetaminophen 5-325 MG tablet Commonly known as: Percocet Take 1 tablet by  mouth every 6 (six) hours as needed for severe pain.   sertraline 25 MG tablet Commonly known as: ZOLOFT Take 25 mg by mouth daily.   Vitamin D 50 MCG (2000 UT) Caps Take 2,000 Units by mouth at bedtime.      ROS was provided with assistance of staff.  Review of Systems  Constitutional: Negative for activity change, appetite change, chills, diaphoresis, fatigue and fever.  HENT: Positive for hearing loss. Negative for congestion and voice change.   Respiratory: Negative for cough, shortness of breath and wheezing.   Cardiovascular: Negative for chest pain, palpitations and leg swelling.  Gastrointestinal: Negative for abdominal distention, abdominal pain, constipation, diarrhea, nausea and vomiting.  Genitourinary: Positive for frequency, hematuria and urgency. Negative for difficulty urinating and dysuria.       Dark urine on adult brief, ?hematuria.   Musculoskeletal: Positive for arthralgias and gait problem.       Left hip pain  Skin: Positive for rash.       Redness in perineal area. Surgical incision left hip, lateral left hip, lateral above the left knee-intact, no s/s of peri wound cellulitis.   Neurological: Negative for dizziness, facial asymmetry, speech difficulty, weakness and headaches.       Dementia  Psychiatric/Behavioral: Negative for agitation, behavioral problems, hallucinations and sleep disturbance. The patient is not nervous/anxious.     Immunization History  Administered Date(s) Administered  . Influenza-Unspecified 03/27/2016, 03/19/2018  . Pneumococcal Conjugate-13 02/24/2014  . Pneumococcal Polysaccharide-23 04/25/2018  . Td 05/27/2012   Pertinent  Health Maintenance Due  Topic Date Due  . INFLUENZA VACCINE  01/31/2019  . PNA vac Low Risk Adult  Completed   Fall Risk  07/16/2018  Falls in the past year? 0   Functional Status Survey:    Vitals:   01/01/19 1023  BP: 110/82  Pulse: 86  Resp: 20  Temp: 98 F (36.7 C)  SpO2: 93%  Weight: 140  lb 12.8 oz (63.9 kg)   Body mass index is 17.6 kg/m. Physical Exam Constitutional:      General: He is not in acute distress.    Appearance: Normal appearance. He is normal weight. He is not ill-appearing, toxic-appearing or diaphoretic.  HENT:     Head: Normocephalic and atraumatic.     Nose: Nose normal. No congestion or rhinorrhea.     Mouth/Throat:     Mouth: Mucous membranes are moist.  Eyes:     Extraocular Movements: Extraocular movements intact.     Conjunctiva/sclera: Conjunctivae normal.     Pupils: Pupils are equal, round, and reactive to light.  Neck:     Musculoskeletal: Normal range of motion and neck supple.  Cardiovascular:     Rate and Rhythm: Normal rate and regular rhythm.     Heart sounds: No murmur.  Pulmonary:  Effort: Pulmonary effort is normal.     Breath sounds: No wheezing, rhonchi or rales.  Chest:     Chest wall: No tenderness.  Abdominal:     General: Bowel sounds are normal. There is no distension.     Palpations: Abdomen is soft.     Tenderness: There is no abdominal tenderness. There is no right CVA tenderness, left CVA tenderness, guarding or rebound.  Musculoskeletal:     Right lower leg: No edema.     Left lower leg: No edema.  Skin:    General: Skin is warm and dry.     Findings: Rash present.     Comments: Redness in perineal area. Surgical incision left hip, lateral left hip, lateral above the left knee-intact, no s/s of peri wound cellulitis.    Neurological:     General: No focal deficit present.     Mental Status: He is alert. Mental status is at baseline.     Cranial Nerves: No cranial nerve deficit.     Motor: No weakness.     Coordination: Coordination normal.     Gait: Gait abnormal.     Comments: Oriented to person and place.   Psychiatric:        Mood and Affect: Mood normal.        Behavior: Behavior normal.        Thought Content: Thought content normal.     Labs reviewed: Recent Labs    12/27/18 0457  12/28/18 0411 12/29/18 0434  NA 136 135 137  K 4.4 4.1 4.3  CL 103 104 104  CO2 26 23 23   GLUCOSE 148* 88 97  BUN 20 31* 30*  CREATININE 1.17 1.24 1.12  CALCIUM 8.5* 8.5* 8.6*   Recent Labs    06/05/18 0339 06/08/18 0012 12/25/18 2106  AST 30 44* 29  ALT 15 20 17   ALKPHOS 94 70 99  BILITOT 0.6 0.6 0.2*  PROT 6.9 5.6* 7.6  ALBUMIN 2.6* 2.3* 3.1*   Recent Labs    06/05/18 0339  06/08/18 0012 06/08/18 0848  12/27/18 0457 12/28/18 0411 12/29/18 0434  WBC 12.8*   < > 8.4 4.8   < > 10.1 9.1 8.8  NEUTROABS 8.2*  --  6.3 2.8  --   --   --   --   HGB 10.6*   < > 7.8* 8.0*   < > 9.9* 8.9* 9.4*  HCT 34.7*   < > 25.0* 25.3*   < > 31.2* 28.5* 29.2*  MCV 102.1*   < > 100.0 98.4   < > 93.7 92.5 91.5  PLT 216   < > 183 150   < > 194 191 213   < > = values in this interval not displayed.   No results found for: TSH Lab Results  Component Value Date   HGBA1C  12/13/2008    5.5 (NOTE) The ADA recommends the following therapeutic goal for glycemic control related to Hgb A1c measurement: Goal of therapy: <6.5 Hgb A1c  Reference: American Diabetes Association: Clinical Practice Recommendations 2010, Diabetes Care, 2010, 33: (Suppl  1).   No results found for: CHOL, HDL, LDLCALC, LDLDIRECT, TRIG, CHOLHDL  Significant Diagnostic Results in last 30 days:  Dg Chest 1 View  Result Date: 12/25/2018 CLINICAL DATA:  Fall EXAM: CHEST  1 VIEW COMPARISON:  06/05/2018 FINDINGS: Post sternotomy changes. Right-sided pacing device as before. Low lung volumes. No acute opacity or pleural effusion. No pneumothorax. Old right fifth through seventh  rib fractures. IMPRESSION: No active disease. Electronically Signed   By: Donavan Foil M.D.   On: 12/25/2018 21:03   Ct Head Wo Contrast  Result Date: 12/25/2018 CLINICAL DATA:  Fall EXAM: CT HEAD WITHOUT CONTRAST TECHNIQUE: Contiguous axial images were obtained from the base of the skull through the vertex without intravenous contrast. COMPARISON:  CT  brain 06/05/2018 FINDINGS: Brain: Mild motion degradation. No acute territorial infarction, hemorrhage or intracranial mass. Moderate to marked atrophy. Stable ventricle size. Mild small vessel ischemic changes of the white matter. Vascular: No hyperdense vessels. Vertebral and carotid vascular calcification. Skull: No fracture Sinuses/Orbits: Mild mucosal thickening in the ethmoid and sphenoid sinuses. Other: None IMPRESSION: 1. No CT evidence for acute intracranial abnormality. 2. Atrophy and small vessel ischemic changes of the white matter. Electronically Signed   By: Donavan Foil M.D.   On: 12/25/2018 21:08   Dg C-arm 1-60 Min  Result Date: 12/26/2018 CLINICAL DATA:  Left intertrochanteric femur fracture ORIF. EXAM: DG C-ARM 61-120 MIN; LEFT FEMUR 2 VIEWS COMPARISON:  Bilateral hip x-rays from yesterday. FLUOROSCOPY TIME:  1 minutes, 33 seconds. C-arm fluoroscopic images were obtained intraoperatively and submitted for post operative interpretation. FINDINGS: Multiple intraoperative fluoroscopic images demonstrate interval cephalomedullary rod fixation of the left intertrochanteric femur fracture. Alignment is anatomic. IMPRESSION: Intraoperative fluoroscopic guidance for left intertrochanteric femur fracture ORIF. Electronically Signed   By: Titus Dubin M.D.   On: 12/26/2018 17:06   Dg Femur Min 2 Views Left  Result Date: 12/26/2018 CLINICAL DATA:  Left intertrochanteric femur fracture ORIF. EXAM: DG C-ARM 61-120 MIN; LEFT FEMUR 2 VIEWS COMPARISON:  Bilateral hip x-rays from yesterday. FLUOROSCOPY TIME:  1 minutes, 33 seconds. C-arm fluoroscopic images were obtained intraoperatively and submitted for post operative interpretation. FINDINGS: Multiple intraoperative fluoroscopic images demonstrate interval cephalomedullary rod fixation of the left intertrochanteric femur fracture. Alignment is anatomic. IMPRESSION: Intraoperative fluoroscopic guidance for left intertrochanteric femur fracture ORIF.  Electronically Signed   By: Titus Dubin M.D.   On: 12/26/2018 17:06   Dg Hips Bilat W Or Wo Pelvis 3-4 Views  Result Date: 12/25/2018 CLINICAL DATA:  Fall EXAM: DG HIP (WITH OR WITHOUT PELVIS) 3-4V BILAT COMPARISON:  None. FINDINGS: Vascular calcifications. Pubic symphysis and rami appear intact. Right hip shows no definitive fracture or dislocation. Suspected left intertrochanteric fracture. No left femoral head dislocation. IMPRESSION: 1. Acute left intertrochanteric fracture. 2. No acute osseous abnormality of the right hip Electronically Signed   By: Donavan Foil M.D.   On: 12/25/2018 21:02    Assessment/Plan: Urinary urgency Will obtain UA C/S, CBC/diff to r/o UTI. Continue Finasteride 5mg  qd  Candidiasis Perineal area: apply Nystatin powder bid to affected area x 10 days, assistant the patient with personal hygiene and incontinent care.   Acute blood loss anemia Stable, last Hgb 9.4 12/29/18, continue Fe 325mg  qd.   Alzheimer's disease ([place]) Continue Donepezil 5mg  bid, SNF [facility] for rehab.   Closed displaced intertrochanteric fracture of left femur ([facility]) S/p L hip ORIF, continue Lovenox 40mg  qd.     Family/ staff Communication: plan of care reviewed with the patient, the patient's daughter, and charge nurse.   Labs/tests ordered:  UA C/S, CBC/diff  Time spend 25 minutes.

## 2019-01-01 NOTE — Assessment & Plan Note (Signed)
Stable, last Hgb 9.4 12/29/18, continue Fe 325mg  qd.

## 2019-01-01 NOTE — Assessment & Plan Note (Addendum)
Will obtain UA C/S, CBC/diff to r/o UTI. Continue Finasteride 5mg  qd

## 2019-01-01 NOTE — Assessment & Plan Note (Signed)
S/p L hip ORIF, continue Lovenox 40mg  qd.

## 2019-01-01 NOTE — Assessment & Plan Note (Signed)
Perineal area: apply Nystatin powder bid to affected area x 10 days, assistant the patient with personal hygiene and incontinent care.

## 2019-01-01 NOTE — Assessment & Plan Note (Signed)
Continue Donepezil 5mg  bid, SNF [facility] for rehab.

## 2019-01-07 LAB — BASIC METABOLIC PANEL
BUN: 23 — AB (ref 4–21)
Creatinine: 1 (ref 0.6–1.3)
Glucose: 89
Potassium: 4.7 (ref 3.4–5.3)
Sodium: 136 — AB (ref 137–147)

## 2019-01-07 LAB — CBC AND DIFFERENTIAL
HCT: 27 — AB (ref 41–53)
Hemoglobin: 8.8 — AB (ref 13.5–17.5)
Platelets: 346 (ref 150–399)

## 2019-01-09 ENCOUNTER — Other Ambulatory Visit: Payer: Self-pay

## 2019-01-09 ENCOUNTER — Ambulatory Visit (INDEPENDENT_AMBULATORY_CARE_PROVIDER_SITE_OTHER): Payer: Medicare Other | Admitting: Orthopaedic Surgery

## 2019-01-09 ENCOUNTER — Ambulatory Visit (INDEPENDENT_AMBULATORY_CARE_PROVIDER_SITE_OTHER): Payer: Medicare Other

## 2019-01-09 DIAGNOSIS — M25552 Pain in left hip: Secondary | ICD-10-CM

## 2019-01-09 NOTE — Progress Notes (Signed)
Post-Op Visit Note   Patient: [name] [name]           Date of Birth: 05/26/1926           MRN: [id number] Visit Date: 01/09/2019 PCP: [name], MD   Assessment & Plan:  Chief Complaint:  Chief Complaint  Patient presents with   Left Leg - Routine Post Op    12/26/2018 IM Nail Left Intertroch   Visit Diagnoses:  1. Pain in left hip     Plan: [name] [name] is a 85 year old gentleman who is two-week status post fixation of intertrochanteric fracture.  He is overall doing well reports no pain.  His surgical incisions have all healed without any evidence of infection.  His x-rays demonstrate stable alignment of fracture with good fixation.  Today we remove the staples and place Steri-Strips.  He will continue with physical therapy for strengthening and rehab.  We will transition him to aspirin 81 mg twice daily for another month.  Questions encouraged and answered.  All this was discussed with his wife.  We will see him back in 4 weeks with two-view x-rays of the left hip.  Follow-Up Instructions: Return in about 4 weeks (around 02/06/2019).   Orders:  Orders Placed This Encounter  Procedures   XR FEMUR MIN 2 VIEWS LEFT   No orders of the defined types were placed in this encounter.   Imaging: Xr Femur Min 2 Views Left  Result Date: 01/09/2019 Stable intramedullary fixation of left intertrochanteric fracture   PMFS History: Patient Active Problem List   Diagnosis Date Noted   Urinary urgency 01/01/2019   Candidiasis 01/01/2019   Closed displaced intertrochanteric fracture of left femur ([place]) 12/25/2018   Closed intertrochanteric fracture of hip, left, initial encounter ([place]) 12/25/2018   Pain due to onychomycosis of toenails of both feet 12/12/2018   Acute blood loss anemia 06/08/2018   GI bleed 06/05/2018   HTN (hypertension)    High cholesterol    Diverticulosis    CAD (coronary artery disease)    Lower GI bleed    AAA (abdominal aortic aneurysm)  without rupture ([place]) 01/09/2018   Ascending aortic aneurysm (HCC) 01/09/2018   Heart block AV second degree 07/15/2017   Status post three vessel coronary artery bypass 07/15/2017   Left ventricular systolic dysfunction without heart failure 05/11/2017   Hypercholesterolemia 05/11/2017   CHB (complete heart block) ([place]) 06/07/2015   Alzheimer's disease ([place]) 01/26/2015   Pacemaker 01/26/2013   Permanent atrial fibrillation ([place]) 01/26/2013   Second degree AV block, Mobitz type II 01/26/2013   Coronary artery disease involving native coronary artery of native heart without angina pectoris 01/26/2013   Dyspnea 01/26/2013   Past Medical History:  Diagnosis Date   Atrial fibrillation, permanent    CAD (coronary artery disease)    Dementia ([place])    Diverticulosis    diverticular bleed 2011   High cholesterol    HTN (hypertension)    has improved over time, no rx as of 2019   Lower GI bleed     Family History  Problem Relation Age of Onset   Stroke Father    Cervical cancer Other        siblings   COPD Sister    Dementia Brother     Past Surgical History:  Procedure Laterality Date   angiolplasty     APPENDECTOMY     INSERT / REPLACE / REMOVE PACEMAKER     INTRAMEDULLARY (IM) NAIL INTERTROCHANTERIC Left 12/26/2018  Procedure: INTRAMEDULLARY (IM) NAIL INTERTROCHANTRIC;  Surgeon: Leandrew Koyanagi, MD;  Location: [facility];  Service: Orthopedics;  Laterality: Left;   KNEE SURGERY     PILONIDAL CYST EXCISION     rotator cuff surgery  1985   TONSILLECTOMY     triple heart bypass     Social History   Occupational History   Occupation: retired  Tobacco Use   Smoking status: Former Smoker    Quit date: 07/02/1974    Years since quitting: 44.5   Smokeless tobacco: Never Used  Substance and Sexual Activity   Alcohol use: Not Currently    Comment: last used in dec 2019   Drug use: No   Sexual activity: Not on file

## 2019-01-19 ENCOUNTER — Encounter: Payer: Self-pay | Admitting: Internal Medicine

## 2019-01-19 ENCOUNTER — Non-Acute Institutional Stay (SKILLED_NURSING_FACILITY): Payer: Medicare Other | Admitting: Internal Medicine

## 2019-01-19 DIAGNOSIS — F0390 Unspecified dementia without behavioral disturbance: Secondary | ICD-10-CM

## 2019-01-19 DIAGNOSIS — S72142S Displaced intertrochanteric fracture of left femur, sequela: Secondary | ICD-10-CM

## 2019-01-19 DIAGNOSIS — E7800 Pure hypercholesterolemia, unspecified: Secondary | ICD-10-CM | POA: Diagnosis not present

## 2019-01-19 DIAGNOSIS — I10 Essential (primary) hypertension: Secondary | ICD-10-CM

## 2019-01-19 DIAGNOSIS — I4821 Permanent atrial fibrillation: Secondary | ICD-10-CM

## 2019-01-19 NOTE — Progress Notes (Addendum)
Location:  [facility] Room Number: 540 Place of Service:  SNF (32)  Provider: Meredith Staggers L,MD   PCP: [name], MD Patient Care Team: [name], MD as PCP - General (Internal Medicine) Croitoru, Dani Gobble, MD as PCP - Cardiology (Cardiology) Virgie Dad, MD as Consulting Physician (Internal Medicine)  Extended Emergency Contact Information Primary Emergency Contact: Marnette Burgess Address: [address]          [place] [address] Johnnette Litter of [place] Phone: [phone] Mobile Phone: [phone] Relation: Spouse Secondary Emergency Contact: Gleason, Ardoin Mobile Phone: [phone] Relation: Son  Code Status: DNR Goals of care:  Advanced Directive information Advanced Directives 01/19/2019  Does Patient Have a Medical Advance Directive? No  Type of Advance Directive Out of facility DNR (pink MOST or yellow form)  Does patient want to make changes to medical advance directive? No - Patient declined  Copy of [place] in Chart? -  Would patient like information on creating a medical advance directive? No - Patient declined  Pre-existing out of facility DNR order (yellow form or pink MOST form) Yellow form placed in chart (order not valid for inpatient use)     Allergies  Allergen Reactions  . Amiodarone Other (See Comments)    Had a severe lung infection in 2003  . Antihistamines, Diphenhydramine-Type Other (See Comments)    Jittery    Chief Complaint  Patient presents with  . Discharge Note    Discharge from facility     HPI:  85 y.o. male  Seen today for Discharge from the Facility   He was in the hospital from 6 /25-6 /29 for left intertrochanteric hip fracture  Patient has a history of CAD s/p CABG, CHB s/p PPM, PAF not on any coagulation due to history of GI bleeds and fall, hypertension, hyperlipidemia, history of abdominal aortic aneurysm, cognitive impairment Patient fell when he was  walking out with his wife.  Did not lose any consciousness Found to have acute left intertrochanteric fracture.  Had intramedullary nail on 6/26. Postop course was complicated with anemia.  Otherwise patient was stable to be discharged for therapy to SNF.  Patient did well with therapy.  He was WBAT.  His pain was controlled with Tylenol. He is already walking with a walker and mild assist.  Continues to have some unsteady gait and stays at risk of falls due to his cognition.  Patient is planning to go back with his wife. Today patient denied any pain.  He denies any shortness of breath or coughing.  No fever   Past Medical History:  Diagnosis Date  . Atrial fibrillation, permanent   . CAD (coronary artery disease)   . Dementia ([place])   . Diverticulosis    diverticular bleed 2011  . High cholesterol   . HTN (hypertension)    has improved over time, no rx as of 2019  . Lower GI bleed     Past Surgical History:  Procedure Laterality Date  . angiolplasty    . APPENDECTOMY    . INSERT / REPLACE / REMOVE PACEMAKER    . INTRAMEDULLARY (IM) NAIL INTERTROCHANTERIC Left 12/26/2018   Procedure: INTRAMEDULLARY (IM) NAIL INTERTROCHANTRIC;  Surgeon: Leandrew Koyanagi, MD;  Location: [facility];  Service: Orthopedics;  Laterality: Left;  . KNEE SURGERY    . PILONIDAL CYST EXCISION    . rotator cuff surgery  1985  . TONSILLECTOMY    . triple heart bypass  reports that he quit smoking about 44 years ago. He has never used smokeless tobacco. He reports previous alcohol use. He reports that he does not use drugs. Social History   Socioeconomic History  . Marital status: Married    Spouse name: [name]  . Number of children: 6  . Years of education: college  . Highest education level: Not on file  Occupational History  . Occupation: retired  Scientific laboratory technician  . Financial resource strain: Not on file  . Food insecurity    Worry: Not on file    Inability: Not on file  . Transportation needs     Medical: Not on file    Non-medical: Not on file  Tobacco Use  . Smoking status: Former Smoker    Quit date: 07/02/1974    Years since quitting: 44.5  . Smokeless tobacco: Never Used  Substance and Sexual Activity  . Alcohol use: Not Currently    Comment: last used in dec 2019  . Drug use: No  . Sexual activity: Not on file  Lifestyle  . Physical activity    Days per week: Not on file    Minutes per session: Not on file  . Stress: Not on file  Relationships  . Social Herbalist on phone: Not on file    Gets together: Not on file    Attends religious service: Not on file    Active member of club or organization: Not on file    Attends meetings of clubs or organizations: Not on file    Relationship status: Not on file  . Intimate partner violence    Fear of current or ex partner: Not on file    Emotionally abused: Not on file    Physically abused: Not on file    Forced sexual activity: Not on file  Other Topics Concern  . Not on file  Social History Narrative   Patient is married [name]) and lives at home with his wife.   Patient has a college education.   Patient is a retired Materials engineer.   Patient does not drink any caffeine.   Patient has six children.   Functional Status Survey:    Allergies  Allergen Reactions  . Amiodarone Other (See Comments)    Had a severe lung infection in 2003  . Antihistamines, Diphenhydramine-Type Other (See Comments)    Jittery    Pertinent  Health Maintenance Due  Topic Date Due  . INFLUENZA VACCINE  01/31/2019  . PNA vac Low Risk Adult  Completed    Medications: Outpatient Encounter Medications as of 01/19/2019  Medication Sig  . AMINO ACIDS-PROTEIN HYDROLYS PO Take 30 mLs by mouth daily.  [name] aspirin EC 81 MG tablet Take 81 mg by mouth daily.  [name] atorvastatin (LIPITOR) 40 MG tablet Take 40 mg by mouth every evening.   . Cholecalciferol (VITAMIN D) 2000 UNITS CAPS Take 2,000 Units by mouth at bedtime.    . donepezil (ARICEPT) 5 MG tablet Take 1 tablet (5 mg total) by mouth 2 (two) times daily.  . ferrous sulfate 325 (65 FE) MG EC tablet Take 325 mg by mouth daily with breakfast. Once a day on Mon,Wed,Fri  . finasteride (PROSCAR) 5 MG tablet Take 5 mg by mouth every evening.   . Multiple Vitamin (MULTIVITAMIN WITH MINERALS) TABS tablet Take 1 tablet by mouth daily.  [name] oxyCODONE-acetaminophen (PERCOCET) 5-325 MG tablet Take 1 tablet by mouth every 6 (six) hours as needed for  severe pain.  [name] sertraline (ZOLOFT) 25 MG tablet Take 25 mg by mouth daily.  . [DISCONTINUED] enoxaparin (LOVENOX) 40 MG/0.4ML injection Inject 0.4 mLs (40 mg total) into the skin daily.   No facility-administered encounter medications on file as of 01/19/2019.     Review of Systems  Vitals:   01/19/19 1057  BP: 120/68  Pulse: 63  Resp: 20  Temp: 98.6 F (37 C)  SpO2: 96%  Weight: 139 lb 12.8 oz (63.4 kg)  Height: 6\' 3"  (1.905 m)   Body mass index is 17.47 kg/m. Physical Exam  Constitutional: . Well-developed and well-nourished.  HENT:  Head: Normocephalic.  Mouth/Throat: Oropharynx is clear and moist.  Eyes: Pupils are equal, round, and reactive to light.  Neck: Neck supple.  Cardiovascular: Normal rate and normal heart sounds.  No murmur heard. Pulmonary/Chest: Effort normal and breath sounds normal. No respiratory distress. No wheezes. She has no rales.  Abdominal: Soft. Bowel sounds are normal. No distension. There is no tenderness. There is no rebound.  Musculoskeletal: No edema. Incision healed Lymphadenopathy: none Neurological: .  Was not oriented to time Did not know his age or Date of year No Deficits Skin: Skin is warm and dry.  Psychiatric: Normal mood and affect. Behavior is normal. Thought content normal.    Labs reviewed: Basic Metabolic Panel: Recent Labs    12/27/18 0457 12/28/18 0411 12/29/18 0434 01/07/19  NA 136 135 137 136*  K 4.4 4.1 4.3 4.7  CL 103 104 104  --   CO2 26  23 23   --   GLUCOSE 148* 88 97  --   BUN 20 31* 30* 23*  CREATININE 1.17 1.24 1.12 1.0  CALCIUM 8.5* 8.5* 8.6*  --    Liver Function Tests: Recent Labs    06/05/18 0339 06/08/18 0012 12/25/18 2106  AST 30 44* 29  ALT 15 20 17   ALKPHOS 94 70 99  BILITOT 0.6 0.6 0.2*  PROT 6.9 5.6* 7.6  ALBUMIN 2.6* 2.3* 3.1*   No results for input(s): LIPASE, AMYLASE in the last 8760 hours. No results for input(s): AMMONIA in the last 8760 hours. CBC: Recent Labs    06/05/18 0339  06/08/18 0012 06/08/18 0848  12/27/18 0457 12/28/18 0411 12/29/18 0434 01/07/19  WBC 12.8*   < > 8.4 4.8   < > 10.1 9.1 8.8  --   NEUTROABS 8.2*  --  6.3 2.8  --   --   --   --   --   HGB 10.6*   < > 7.8* 8.0*   < > 9.9* 8.9* 9.4* 8.8*  HCT 34.7*   < > 25.0* 25.3*   < > 31.2* 28.5* 29.2* 27*  MCV 102.1*   < > 100.0 98.4   < > 93.7 92.5 91.5  --   PLT 216   < > 183 150   < > 194 191 213 346   < > = values in this interval not displayed.   Cardiac Enzymes: No results for input(s): CKTOTAL, CKMB, CKMBINDEX, TROPONINI in the last 8760 hours. BNP: Invalid input(s): POCBNP CBG: No results for input(s): GLUCAP in the last 8760 hours.  Procedures and Imaging Studies During Stay: Dg Chest 1 View  Result Date: 12/25/2018 CLINICAL DATA:  Fall EXAM: CHEST  1 VIEW COMPARISON:  06/05/2018 FINDINGS: Post sternotomy changes. Right-sided pacing device as before. Low lung volumes. No acute opacity or pleural effusion. No pneumothorax. Old right fifth through seventh rib fractures. IMPRESSION: No active  disease. Electronically Signed   By: Donavan Foil M.D.   On: 12/25/2018 21:03   Ct Head Wo Contrast  Result Date: 12/25/2018 CLINICAL DATA:  Fall EXAM: CT HEAD WITHOUT CONTRAST TECHNIQUE: Contiguous axial images were obtained from the base of the skull through the vertex without intravenous contrast. COMPARISON:  CT brain 06/05/2018 FINDINGS: Brain: Mild motion degradation. No acute territorial infarction, hemorrhage or  intracranial mass. Moderate to marked atrophy. Stable ventricle size. Mild small vessel ischemic changes of the white matter. Vascular: No hyperdense vessels. Vertebral and carotid vascular calcification. Skull: No fracture Sinuses/Orbits: Mild mucosal thickening in the ethmoid and sphenoid sinuses. Other: None IMPRESSION: 1. No CT evidence for acute intracranial abnormality. 2. Atrophy and small vessel ischemic changes of the white matter. Electronically Signed   By: Donavan Foil M.D.   On: 12/25/2018 21:08   Dg C-arm 1-60 Min  Result Date: 12/26/2018 CLINICAL DATA:  Left intertrochanteric femur fracture ORIF. EXAM: DG C-ARM 61-120 MIN; LEFT FEMUR 2 VIEWS COMPARISON:  Bilateral hip x-rays from yesterday. FLUOROSCOPY TIME:  1 minutes, 33 seconds. C-arm fluoroscopic images were obtained intraoperatively and submitted for post operative interpretation. FINDINGS: Multiple intraoperative fluoroscopic images demonstrate interval cephalomedullary rod fixation of the left intertrochanteric femur fracture. Alignment is anatomic. IMPRESSION: Intraoperative fluoroscopic guidance for left intertrochanteric femur fracture ORIF. Electronically Signed   By: Titus Dubin M.D.   On: 12/26/2018 17:06   Dg Femur Min 2 Views Left  Result Date: 12/26/2018 CLINICAL DATA:  Left intertrochanteric femur fracture ORIF. EXAM: DG C-ARM 61-120 MIN; LEFT FEMUR 2 VIEWS COMPARISON:  Bilateral hip x-rays from yesterday. FLUOROSCOPY TIME:  1 minutes, 33 seconds. C-arm fluoroscopic images were obtained intraoperatively and submitted for post operative interpretation. FINDINGS: Multiple intraoperative fluoroscopic images demonstrate interval cephalomedullary rod fixation of the left intertrochanteric femur fracture. Alignment is anatomic. IMPRESSION: Intraoperative fluoroscopic guidance for left intertrochanteric femur fracture ORIF. Electronically Signed   By: Titus Dubin M.D.   On: 12/26/2018 17:06   Dg Hips Bilat W Or Wo Pelvis  3-4 Views  Result Date: 12/25/2018 CLINICAL DATA:  Fall EXAM: DG HIP (WITH OR WITHOUT PELVIS) 3-4V BILAT COMPARISON:  None. FINDINGS: Vascular calcifications. Pubic symphysis and rami appear intact. Right hip shows no definitive fracture or dislocation. Suspected left intertrochanteric fracture. No left femoral head dislocation. IMPRESSION: 1. Acute left intertrochanteric fracture. 2. No acute osseous abnormality of the right hip Electronically Signed   By: Donavan Foil M.D.   On: 12/25/2018 21:02   Xr Femur Min 2 Views Left  Result Date: 01/09/2019 Stable intramedullary fixation of left intertrochanteric fracture   Assessment/Plan:    Permanent atrial fibrillation ([place]) - Plan:  Not on any Anticoagulation due to Falls Rate is controlled S/P ORIF for Left Hip Fracture Walking with walker but stays very Unsteady Due to his Cognition Continue Home THerapy Pain Control Follow with Ortho  Hyperlipidemia- Plan:  Continue Lipitor  Acute blood loss anemia - Plan Continue [place] Check CBC before discharge:  Dementia Continue on Aricept Has supportive Wife  Depression On Zoloft  Patient is being discharged with the following home health services:  Physical Therapy  Patient is being discharged with the following durable medical equipment:  Wheelchair for Gait instability and Hip Fracture .Unable to perform ADLS effectively without the Wheelchair And his wife is Planning to help him  Patient has been advised to f/u with their PCP in 1-2 weeks to for a transitions of care visit.  Social services at their facility was  responsible for arranging this appointment.  Pt was provided with adequate prescriptions of noncontrolled medications to reach the scheduled appointment .  For controlled substances, a limited supply was provided as appropriate for the individual patient.  If the pt normally receives these medications from a pain clinic or has a contract with another physician, these  medications should be received from that clinic or physician only).    Future labs/tests needed:    Total time spent in this patient care encounter was  25_  minutes; greater than 50% of the visit spent counseling patient and staff, reviewing records , Labs and coordinating care for problems addressed at this encounter.

## 2019-01-20 LAB — CBC AND DIFFERENTIAL
HCT: 29 — AB (ref 41–53)
Hemoglobin: 9.1 — AB (ref 13.5–17.5)
Platelets: 250 (ref 150–399)
WBC: 5.8

## 2019-01-20 LAB — CBC: RBC: 3.04 — AB (ref 3.87–5.11)

## 2019-01-22 DIAGNOSIS — Z9181 History of falling: Secondary | ICD-10-CM | POA: Diagnosis not present

## 2019-01-22 DIAGNOSIS — I10 Essential (primary) hypertension: Secondary | ICD-10-CM | POA: Diagnosis not present

## 2019-01-22 DIAGNOSIS — R3915 Urgency of urination: Secondary | ICD-10-CM | POA: Diagnosis not present

## 2019-01-22 DIAGNOSIS — Z7982 Long term (current) use of aspirin: Secondary | ICD-10-CM | POA: Diagnosis not present

## 2019-01-22 DIAGNOSIS — I442 Atrioventricular block, complete: Secondary | ICD-10-CM | POA: Diagnosis not present

## 2019-01-22 DIAGNOSIS — I714 Abdominal aortic aneurysm, without rupture: Secondary | ICD-10-CM | POA: Diagnosis not present

## 2019-01-22 DIAGNOSIS — E7800 Pure hypercholesterolemia, unspecified: Secondary | ICD-10-CM | POA: Diagnosis not present

## 2019-01-22 DIAGNOSIS — I712 Thoracic aortic aneurysm, without rupture: Secondary | ICD-10-CM | POA: Diagnosis not present

## 2019-01-22 DIAGNOSIS — G309 Alzheimer's disease, unspecified: Secondary | ICD-10-CM | POA: Diagnosis not present

## 2019-01-22 DIAGNOSIS — I2510 Atherosclerotic heart disease of native coronary artery without angina pectoris: Secondary | ICD-10-CM | POA: Diagnosis not present

## 2019-01-22 DIAGNOSIS — S72142D Displaced intertrochanteric fracture of left femur, subsequent encounter for closed fracture with routine healing: Secondary | ICD-10-CM | POA: Diagnosis not present

## 2019-01-22 DIAGNOSIS — R350 Frequency of micturition: Secondary | ICD-10-CM | POA: Diagnosis not present

## 2019-01-22 DIAGNOSIS — I4821 Permanent atrial fibrillation: Secondary | ICD-10-CM | POA: Diagnosis not present

## 2019-01-22 DIAGNOSIS — W19XXXD Unspecified fall, subsequent encounter: Secondary | ICD-10-CM | POA: Diagnosis not present

## 2019-01-22 DIAGNOSIS — Z951 Presence of aortocoronary bypass graft: Secondary | ICD-10-CM | POA: Diagnosis not present

## 2019-01-22 DIAGNOSIS — F0280 Dementia in other diseases classified elsewhere without behavioral disturbance: Secondary | ICD-10-CM | POA: Diagnosis not present

## 2019-01-22 DIAGNOSIS — Z950 Presence of cardiac pacemaker: Secondary | ICD-10-CM | POA: Diagnosis not present

## 2019-01-22 DIAGNOSIS — N401 Enlarged prostate with lower urinary tract symptoms: Secondary | ICD-10-CM | POA: Diagnosis not present

## 2019-01-26 DIAGNOSIS — L820 Inflamed seborrheic keratosis: Secondary | ICD-10-CM | POA: Diagnosis not present

## 2019-01-26 DIAGNOSIS — L821 Other seborrheic keratosis: Secondary | ICD-10-CM | POA: Diagnosis not present

## 2019-01-27 DIAGNOSIS — I4819 Other persistent atrial fibrillation: Secondary | ICD-10-CM | POA: Diagnosis not present

## 2019-01-27 DIAGNOSIS — G309 Alzheimer's disease, unspecified: Secondary | ICD-10-CM | POA: Diagnosis not present

## 2019-01-27 DIAGNOSIS — D649 Anemia, unspecified: Secondary | ICD-10-CM | POA: Diagnosis not present

## 2019-01-27 DIAGNOSIS — I4821 Permanent atrial fibrillation: Secondary | ICD-10-CM | POA: Diagnosis not present

## 2019-01-27 DIAGNOSIS — I10 Essential (primary) hypertension: Secondary | ICD-10-CM | POA: Diagnosis not present

## 2019-01-27 DIAGNOSIS — I2510 Atherosclerotic heart disease of native coronary artery without angina pectoris: Secondary | ICD-10-CM | POA: Diagnosis not present

## 2019-01-27 DIAGNOSIS — S72142D Displaced intertrochanteric fracture of left femur, subsequent encounter for closed fracture with routine healing: Secondary | ICD-10-CM | POA: Diagnosis not present

## 2019-01-27 DIAGNOSIS — G301 Alzheimer's disease with late onset: Secondary | ICD-10-CM | POA: Diagnosis not present

## 2019-01-27 DIAGNOSIS — I442 Atrioventricular block, complete: Secondary | ICD-10-CM | POA: Diagnosis not present

## 2019-01-28 DIAGNOSIS — S72142D Displaced intertrochanteric fracture of left femur, subsequent encounter for closed fracture with routine healing: Secondary | ICD-10-CM | POA: Diagnosis not present

## 2019-01-28 DIAGNOSIS — I2510 Atherosclerotic heart disease of native coronary artery without angina pectoris: Secondary | ICD-10-CM | POA: Diagnosis not present

## 2019-01-28 DIAGNOSIS — I442 Atrioventricular block, complete: Secondary | ICD-10-CM | POA: Diagnosis not present

## 2019-01-28 DIAGNOSIS — G309 Alzheimer's disease, unspecified: Secondary | ICD-10-CM | POA: Diagnosis not present

## 2019-01-28 DIAGNOSIS — I4821 Permanent atrial fibrillation: Secondary | ICD-10-CM | POA: Diagnosis not present

## 2019-01-28 DIAGNOSIS — I10 Essential (primary) hypertension: Secondary | ICD-10-CM | POA: Diagnosis not present

## 2019-01-30 DIAGNOSIS — I4821 Permanent atrial fibrillation: Secondary | ICD-10-CM | POA: Diagnosis not present

## 2019-01-30 DIAGNOSIS — S72142D Displaced intertrochanteric fracture of left femur, subsequent encounter for closed fracture with routine healing: Secondary | ICD-10-CM | POA: Diagnosis not present

## 2019-01-30 DIAGNOSIS — I2510 Atherosclerotic heart disease of native coronary artery without angina pectoris: Secondary | ICD-10-CM | POA: Diagnosis not present

## 2019-01-30 DIAGNOSIS — I442 Atrioventricular block, complete: Secondary | ICD-10-CM | POA: Diagnosis not present

## 2019-01-30 DIAGNOSIS — I10 Essential (primary) hypertension: Secondary | ICD-10-CM | POA: Diagnosis not present

## 2019-01-30 DIAGNOSIS — G309 Alzheimer's disease, unspecified: Secondary | ICD-10-CM | POA: Diagnosis not present

## 2019-02-02 ENCOUNTER — Other Ambulatory Visit: Payer: Self-pay

## 2019-02-02 ENCOUNTER — Encounter: Payer: Self-pay | Admitting: Cardiovascular Disease

## 2019-02-02 ENCOUNTER — Ambulatory Visit (INDEPENDENT_AMBULATORY_CARE_PROVIDER_SITE_OTHER): Payer: Medicare Other | Admitting: Cardiovascular Disease

## 2019-02-02 VITALS — BP 99/56 | HR 62 | Temp 97.1°F | Ht 75.0 in | Wt 143.0 lb

## 2019-02-02 DIAGNOSIS — Z950 Presence of cardiac pacemaker: Secondary | ICD-10-CM

## 2019-02-02 DIAGNOSIS — I442 Atrioventricular block, complete: Secondary | ICD-10-CM | POA: Diagnosis not present

## 2019-02-02 DIAGNOSIS — I4821 Permanent atrial fibrillation: Secondary | ICD-10-CM

## 2019-02-02 DIAGNOSIS — I7121 Aneurysm of the ascending aorta, without rupture: Secondary | ICD-10-CM

## 2019-02-02 DIAGNOSIS — I2510 Atherosclerotic heart disease of native coronary artery without angina pectoris: Secondary | ICD-10-CM | POA: Diagnosis not present

## 2019-02-02 DIAGNOSIS — I519 Heart disease, unspecified: Secondary | ICD-10-CM | POA: Diagnosis not present

## 2019-02-02 DIAGNOSIS — I712 Thoracic aortic aneurysm, without rupture: Secondary | ICD-10-CM | POA: Diagnosis not present

## 2019-02-02 DIAGNOSIS — I10 Essential (primary) hypertension: Secondary | ICD-10-CM | POA: Diagnosis not present

## 2019-02-02 DIAGNOSIS — I25810 Atherosclerosis of coronary artery bypass graft(s) without angina pectoris: Secondary | ICD-10-CM | POA: Diagnosis not present

## 2019-02-02 DIAGNOSIS — I714 Abdominal aortic aneurysm, without rupture, unspecified: Secondary | ICD-10-CM

## 2019-02-02 DIAGNOSIS — S72142D Displaced intertrochanteric fracture of left femur, subsequent encounter for closed fracture with routine healing: Secondary | ICD-10-CM | POA: Diagnosis not present

## 2019-02-02 DIAGNOSIS — G309 Alzheimer's disease, unspecified: Secondary | ICD-10-CM | POA: Diagnosis not present

## 2019-02-02 NOTE — Patient Instructions (Signed)

## 2019-02-02 NOTE — Progress Notes (Signed)
Cardiology Office Note    Date:  02/02/2019   ID:  [name] [name], DOB [birth year], MRN [id number]  PCP:  [name], MD  Cardiologist:   [name], MD   Chief Complaint  Patient presents with   Follow-up  Pacemaker check, CAD  History of Present Illness:  [name] [name] is a 85 y.o. male with complete heart block (pacemaker dependent), permanent atrial fibrillation, CAD s/p CABG, Alzheimer's dementia, small ascending thoracic aortic aneurysm (4 cm), small abdominal aortic aneurysm (2.8 cm), hypertension and hyperlipidemia here for follow-up on his pacemaker (2009, [employer] Zephyr XL DR, programmed VVIR).  After 2 episodes of severe lower GI bleeding in December and worsening gait stability we decided to stop his anticoagulation.  Last month he suffered a mechanical fall and had a left hip fracture.  He was operated at [facility] and then spent a couple of weeks at friend's home for rehab.  He has been home for about 10 days and is still receiving physical therapy and Occupational Therapy.  His memory is clearly continuing to deteriorate.  He has permanent atrial fibrillation and his [place] dual-chamber pacemaker is programmed VVIR.  The current generator was implanted in 2009 and still has 3-3 0.75 years of longevity.  The leads were implanted in 2001 and are still functioning at normal parameters.  Device interrogation shows 98 % ventricular pacing.  Underlying rhythm is atrial fibrillation with slow ventricular response with a rate of 35-45 bpm. However, on some of his previous checks he has not had any underlying escape rhythm.  Echo performed in July 2019 shows borderline LV systolic function with EF of 50-55%, aortic valve sclerosis without stenosis.  He had bypass surgery in 2010 [name], LIMA to LAD, SVG to distal circumflex, SVG to distal RCA) and has not had any signs or symptoms of coronary disease since that time.  He had a surgical maze  ablation and ligation of left atrial appendage at that time. His nuclear stress test in August 2014 showed a dense inferolateral scar. Left ventricular ejection fraction was around 50%.   Past Medical History:  Diagnosis Date   Atrial fibrillation, permanent    CAD (coronary artery disease)    Dementia ([place])    Diverticulosis    diverticular bleed 2011   High cholesterol    HTN (hypertension)    has improved over time, no rx as of 2019   Lower GI bleed     Past Surgical History:  Procedure Laterality Date   angiolplasty     APPENDECTOMY     INSERT / REPLACE / REMOVE PACEMAKER     INTRAMEDULLARY (IM) NAIL INTERTROCHANTERIC Left 12/26/2018   Procedure: INTRAMEDULLARY (IM) NAIL INTERTROCHANTRIC;  Surgeon: Leandrew Koyanagi, MD;  Location: [facility];  Service: Orthopedics;  Laterality: Left;   KNEE SURGERY     PILONIDAL CYST EXCISION     rotator cuff surgery  1985   TONSILLECTOMY     triple heart bypass      Current Medications: Outpatient Medications Prior to Visit  Medication Sig Dispense Refill   AMINO ACIDS-PROTEIN HYDROLYS PO Take 30 mLs by mouth daily.     aspirin EC 81 MG tablet Take 81 mg by mouth daily.     atorvastatin (LIPITOR) 40 MG tablet Take 40 mg by mouth every evening.      Cholecalciferol (VITAMIN D) 2000 UNITS CAPS Take 2,000 Units by mouth at bedtime.  donepezil (ARICEPT) 5 MG tablet Take 1 tablet (5 mg total) by mouth 2 (two) times daily. 180 tablet 3   finasteride (PROSCAR) 5 MG tablet Take 5 mg by mouth every evening.      Multiple Vitamin (MULTIVITAMIN WITH MINERALS) TABS tablet Take 1 tablet by mouth daily.     sertraline (ZOLOFT) 25 MG tablet Take 25 mg by mouth daily.     ferrous sulfate 325 (65 FE) MG EC tablet Take 325 mg by mouth daily with breakfast. Once a day on Mon,Wed,Fri     oxyCODONE-acetaminophen (PERCOCET) 5-325 MG tablet Take 1 tablet by mouth every 6 (six) hours as needed for severe pain. (Patient not taking: Reported  on 02/02/2019) 30 tablet 0   No facility-administered medications prior to visit.      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 file   Food insecurity    Worry: Not on file    Inability: Not on file   Transportation needs    Medical: Not on file    Non-medical: Not on file  Tobacco Use   Smoking status: Former Smoker    Quit date: 07/02/1974    Years since quitting: 44.6   Smokeless tobacco: Never Used  Substance and Sexual Activity   Alcohol use: Not Currently    Comment: last used in dec 2019   Drug use: No   Sexual activity: Not on file  Lifestyle   Physical activity    Days per week: Not on file    Minutes per session: Not on file   Stress: Not on file  Relationships   Social connections    Talks on phone: Not on file    Gets together: Not on file    Attends religious service: Not on file    Active member of club or organization: Not on file    Attends meetings of clubs or organizations: Not on file    Relationship status: Not on file  Other Topics Concern   Not on file  Social History Narrative   Patient is married [name]) and lives at home with his wife.   Patient has a college education.   Patient is a retired Materials engineer.   Patient does not drink any caffeine.   Patient has six children.     Family History:  The patient's family history includes COPD in his sister; Cervical cancer in an other family member; Dementia in his brother; Stroke in his father.   ROS:   Please see the history of present illness.    ROS all other systems are reviewed and are negative.  Some limitations due to memory problems.   PHYSICAL  EXAM:   VS:  BP (!) 99/56    Pulse 62    Temp (!) 97.1 F (36.2 C)    Ht 6\' 3"  (1.905 m)    Wt 143 lb (64.9 kg)    SpO2 96%    BMI 17.87 kg/m      General: Alert, oriented x3, no distress, very lean, borderline cachectic Head: no evidence of trauma, PERRL, EOMI, no exophtalmos or lid lag, no myxedema, no xanthelasma; normal ears, nose and oropharynx  Neck: normal jugular venous pulsations and no hepatojugular reflux; brisk carotid pulses without delay and no carotid bruits Chest: clear to auscultation, no signs of consolidation by percussion or palpation, normal fremitus, symmetrical and full respiratory excursions Cardiovascular: normal position and quality of the apical impulse, regular rhythm, normal first and second heart sounds, 2/6 musical early to mid peaking systolic ejection murmur heard throughout the precordium, no diastolic murmurs, rubs or gallops.  Healthy but prominent pacemaker underneath the left subclavian area Abdomen: no tenderness or distention, no masses by palpation, no abnormal pulsatility or arterial bruits, normal bowel sounds, no hepatosplenomegaly Extremities: no clubbing, cyanosis or edema; 2+ radial, ulnar and brachial pulses bilaterally; 2+ right femoral, posterior tibial and dorsalis pedis pulses; 2+ left femoral, posterior tibial and dorsalis pedis pulses; no subclavian or femoral bruits Neurological: grossly nonfocal Psych: Normal mood and affect     Wt Readings from Last 3 Encounters:  02/02/19 143 lb (64.9 kg)  01/19/19 139 lb 12.8 oz (63.4 kg)  01/01/19 140 lb 12.8 oz (63.9 kg)      Studies/Labs Reviewed:   EKG:  EKG is not ordered today.  Intracardiac electrogram shows atrial fibrillation and 100% ventricular paced rhythm  BMET    Component Value Date/Time   NA 136 (A) 01/07/2019   K 4.7 01/07/2019   CL 104 12/29/2018 0434   CO2 23 12/29/2018 0434   GLUCOSE 97 12/29/2018 0434   BUN 23 (A) 01/07/2019   CREATININE 1.0 01/07/2019   CREATININE  1.12 12/29/2018 0434   CALCIUM 8.6 (L) 12/29/2018 0434   GFRNONAA 57 (L) 12/29/2018 0434   GFRAA >60 12/29/2018 0434   Lipid profile July 2019  total cholesterol 139, triglycerides 141, HDL 36, LDL 75  ASSESSMENT:    1. CHB (complete heart block) (HCC)   2. Permanent atrial fibrillation ([place])   3. Coronary artery disease involving coronary bypass graft of native heart without angina pectoris   4. Left ventricular systolic dysfunction without heart failure   5. Pacemaker   6. Ascending aortic aneurysm ([place])   7. AAA (abdominal aortic aneurysm) without rupture (HCC)      PLAN:  In order of problems listed above:  1. 2nd/3rd deg AV block: He has atrial fibrillation with slow ventricular response and sometimes complete heart block.  Today he does have an underlying rhythm at about 40 bpm.  For safety he should be considered pacemaker dependent. 2. AFib: Permanent arrhythmia, slow ventricular response, not requiring medications.  Anticoagulation stopped due to gait instability and at least 2 serious episodes of lower GI bleeding.  CHADSVasc 4 (age 20, CAD, LV dysf). He does have a history of appendage ligation, so embolic risk is lower. 3. CAD s/p CABG: No angina/asymptomatic.  Most recent LDL cholesterol was 75 which I think is close enough to target range 4. Borderline LV dysfunction: He has never had overt heart failure and does not need diuretics.  We are not using beta-blockers or R AAS inhibitors to avoid polypharmacy in this elderly gentleman with cognitive decline and a tendency to fall. 5. PM: Normal device function.  His pacemaker is not amenable to remote monitoring.  We will bring him back to the office in 6 months. 6. Ascending aortic aneurysm and abdominal aortic aneurysm: These have been stable in size and minor abnormalities (4 cm and 2.8 cm respectively).  In view of his advanced age (and since he would not to be a good surgical candidate) we have decided not to monitor these  on a routine basis.   Medication Adjustments/Labs and Tests Ordered: Current medicines are reviewed at length with the patient today.  Concerns regarding medicines are outlined above.  Medication changes, Labs and Tests ordered today are listed in the Patient Instructions below. Patient Instructions  Medication Instructions:  Your physician recommends that you continue on your current medications as directed. Please refer to the Current Medication list given to you today.  If you need a refill on your cardiac medications before your next appointment, please call your pharmacy.   Lab work: None ordered If you have labs (blood work) drawn today and your tests are completely normal, you will receive your results only by: [place] (if you have MyChart) OR A paper copy in the mail If you have any lab test that is abnormal or we need to change your treatment, we will call you to review the results.  Testing/Procedures: None ordered  Follow-Up: At [facility], you and your health needs are our priority.  As part of our continuing mission to provide you with exceptional heart care, we have created designated Provider Care Teams.  These Care Teams include your primary Cardiologist (physician) and Advanced Practice Providers (APPs -  Physician Assistants and Nurse Practitioners) who all work together to provide you with the care you need, when you need it. You will need a follow up appointment in 6 months.  Please call our office 2 months in advance to schedule this appointment.  You may see [name], MD or one of the following Advanced Practice Providers on your designated Care Team: Almyra Deforest, PA-C Fabian Sharp, [place]          Signed, [name], MD  02/02/2019 3:08 PM    [address], [place], [place]  [address] Phone: [phone]; Fax: [phone]

## 2019-02-03 DIAGNOSIS — I2510 Atherosclerotic heart disease of native coronary artery without angina pectoris: Secondary | ICD-10-CM | POA: Diagnosis not present

## 2019-02-03 DIAGNOSIS — I4821 Permanent atrial fibrillation: Secondary | ICD-10-CM | POA: Diagnosis not present

## 2019-02-03 DIAGNOSIS — I10 Essential (primary) hypertension: Secondary | ICD-10-CM | POA: Diagnosis not present

## 2019-02-03 DIAGNOSIS — S72142D Displaced intertrochanteric fracture of left femur, subsequent encounter for closed fracture with routine healing: Secondary | ICD-10-CM | POA: Diagnosis not present

## 2019-02-03 DIAGNOSIS — G309 Alzheimer's disease, unspecified: Secondary | ICD-10-CM | POA: Diagnosis not present

## 2019-02-03 DIAGNOSIS — I442 Atrioventricular block, complete: Secondary | ICD-10-CM | POA: Diagnosis not present

## 2019-02-04 DIAGNOSIS — I442 Atrioventricular block, complete: Secondary | ICD-10-CM | POA: Diagnosis not present

## 2019-02-04 DIAGNOSIS — S72142D Displaced intertrochanteric fracture of left femur, subsequent encounter for closed fracture with routine healing: Secondary | ICD-10-CM | POA: Diagnosis not present

## 2019-02-04 DIAGNOSIS — G309 Alzheimer's disease, unspecified: Secondary | ICD-10-CM | POA: Diagnosis not present

## 2019-02-04 DIAGNOSIS — I10 Essential (primary) hypertension: Secondary | ICD-10-CM | POA: Diagnosis not present

## 2019-02-04 DIAGNOSIS — I2510 Atherosclerotic heart disease of native coronary artery without angina pectoris: Secondary | ICD-10-CM | POA: Diagnosis not present

## 2019-02-04 DIAGNOSIS — I4821 Permanent atrial fibrillation: Secondary | ICD-10-CM | POA: Diagnosis not present

## 2019-02-05 ENCOUNTER — Ambulatory Visit (INDEPENDENT_AMBULATORY_CARE_PROVIDER_SITE_OTHER): Payer: Medicare Other

## 2019-02-05 ENCOUNTER — Encounter: Payer: Self-pay | Admitting: Orthopaedic Surgery

## 2019-02-05 ENCOUNTER — Ambulatory Visit (INDEPENDENT_AMBULATORY_CARE_PROVIDER_SITE_OTHER): Payer: Medicare Other | Admitting: Orthopaedic Surgery

## 2019-02-05 DIAGNOSIS — M25552 Pain in left hip: Secondary | ICD-10-CM

## 2019-02-05 NOTE — Progress Notes (Signed)
Post-Op Visit Note   Patient: [name] [name]           Date of Birth: 11-Dec-1925           MRN: [id number] Visit Date: 02/05/2019 PCP: [name], MD   Assessment & Plan:  Chief Complaint:  Chief Complaint  Patient presents with  . Left Hip - Follow-up   Visit Diagnoses:  1. Pain in left hip     Plan: Patient is a pleasant 85 year old gentleman who presents our clinic today for follow-up of his left hip.  He is approximately 6 weeks status post left hip intramedullary nail from an intertrochanteric femur fracture, date of surgery 12/26/2018.  He has been doing well.  He is here with his wife.  He has been home for the past 2 weeks getting home health physical therapy.  He is ambulating with a walker.  Examination of his left hip shows a fully healed surgical incision.  He is able to slightly straight leg raise.  Calves are soft nontender.  He is neurovascular intact distally.  X-rays demonstrate stable alignment of the fracture and hardware without interval change.  At this point, he will continue with physical therapy.  He will follow-up with [place] in 6 weeks time for repeat evaluation and 2 view x-rays of the left hip.  Call with concerns or questions the meantime.  Follow-Up Instructions: Return in about 6 weeks (around 03/19/2019).   Orders:  Orders Placed This Encounter  Procedures  . XR HIP UNILAT W OR W/O PELVIS 2-3 VIEWS LEFT   No orders of the defined types were placed in this encounter.   Imaging: Xr Hip Unilat W Or W/o Pelvis 2-3 Views Left  Result Date: 02/05/2019 X-rays demonstrate stable alignment of the fracture without interval change   PMFS History: Patient Active Problem List   Diagnosis Date Noted  . Urinary urgency 01/01/2019  . Candidiasis 01/01/2019  . Closed displaced intertrochanteric fracture of left femur ([place]) 12/25/2018  . Closed intertrochanteric fracture of hip, left, initial encounter ([place]) 12/25/2018  . Pain due to onychomycosis of toenails  of both feet 12/12/2018  . Acute blood loss anemia 06/08/2018  . GI bleed 06/05/2018  . HTN (hypertension)   . High cholesterol   . Diverticulosis   . CAD (coronary artery disease)   . Lower GI bleed   . AAA (abdominal aortic aneurysm) without rupture ([place]) 01/09/2018  . Ascending aortic aneurysm ([place]) 01/09/2018  . Heart block AV second degree 07/15/2017  . Status post three vessel coronary artery bypass 07/15/2017  . Left ventricular systolic dysfunction without heart failure 05/11/2017  . Hypercholesterolemia 05/11/2017  . CHB (complete heart block) ([place]) 06/07/2015  . Alzheimer's disease ([place]) 01/26/2015  . Pacemaker 01/26/2013  . Permanent atrial fibrillation ([place]) 01/26/2013  . Second degree AV block, Mobitz type II 01/26/2013  . Coronary artery disease involving native coronary artery of native heart without angina pectoris 01/26/2013  . Dyspnea 01/26/2013   Past Medical History:  Diagnosis Date  . Atrial fibrillation, permanent   . CAD (coronary artery disease)   . Dementia ([place])   . Diverticulosis    diverticular bleed 2011  . High cholesterol   . HTN (hypertension)    has improved over time, no rx as of 2019  . Lower GI bleed     Family History  Problem Relation Age of Onset  . Stroke Father   . Cervical cancer Other        siblings  .  COPD Sister   . Dementia Brother     Past Surgical History:  Procedure Laterality Date  . angiolplasty    . APPENDECTOMY    . INSERT / REPLACE / REMOVE PACEMAKER    . INTRAMEDULLARY (IM) NAIL INTERTROCHANTERIC Left 12/26/2018   Procedure: INTRAMEDULLARY (IM) NAIL INTERTROCHANTRIC;  Surgeon: Leandrew Koyanagi, MD;  Location: [facility];  Service: Orthopedics;  Laterality: Left;  . KNEE SURGERY    . PILONIDAL CYST EXCISION    . rotator cuff surgery  1985  . TONSILLECTOMY    . triple heart bypass     Social History   Occupational History  . Occupation: retired  Tobacco Use  . Smoking status: Former Smoker    Quit date: 07/02/1974     Years since quitting: 44.6  . Smokeless tobacco: Never Used  Substance and Sexual Activity  . Alcohol use: Not Currently    Comment: last used in dec 2019  . Drug use: No  . Sexual activity: Not on file

## 2019-02-06 DIAGNOSIS — I442 Atrioventricular block, complete: Secondary | ICD-10-CM | POA: Diagnosis not present

## 2019-02-06 DIAGNOSIS — G309 Alzheimer's disease, unspecified: Secondary | ICD-10-CM | POA: Diagnosis not present

## 2019-02-06 DIAGNOSIS — I4821 Permanent atrial fibrillation: Secondary | ICD-10-CM | POA: Diagnosis not present

## 2019-02-06 DIAGNOSIS — I2510 Atherosclerotic heart disease of native coronary artery without angina pectoris: Secondary | ICD-10-CM | POA: Diagnosis not present

## 2019-02-06 DIAGNOSIS — I10 Essential (primary) hypertension: Secondary | ICD-10-CM | POA: Diagnosis not present

## 2019-02-06 DIAGNOSIS — S72142D Displaced intertrochanteric fracture of left femur, subsequent encounter for closed fracture with routine healing: Secondary | ICD-10-CM | POA: Diagnosis not present

## 2019-02-09 DIAGNOSIS — I4821 Permanent atrial fibrillation: Secondary | ICD-10-CM | POA: Diagnosis not present

## 2019-02-09 DIAGNOSIS — I10 Essential (primary) hypertension: Secondary | ICD-10-CM | POA: Diagnosis not present

## 2019-02-09 DIAGNOSIS — I442 Atrioventricular block, complete: Secondary | ICD-10-CM | POA: Diagnosis not present

## 2019-02-09 DIAGNOSIS — G309 Alzheimer's disease, unspecified: Secondary | ICD-10-CM | POA: Diagnosis not present

## 2019-02-09 DIAGNOSIS — I2510 Atherosclerotic heart disease of native coronary artery without angina pectoris: Secondary | ICD-10-CM | POA: Diagnosis not present

## 2019-02-09 DIAGNOSIS — S72142D Displaced intertrochanteric fracture of left femur, subsequent encounter for closed fracture with routine healing: Secondary | ICD-10-CM | POA: Diagnosis not present

## 2019-02-11 DIAGNOSIS — I4821 Permanent atrial fibrillation: Secondary | ICD-10-CM | POA: Diagnosis not present

## 2019-02-11 DIAGNOSIS — G309 Alzheimer's disease, unspecified: Secondary | ICD-10-CM | POA: Diagnosis not present

## 2019-02-11 DIAGNOSIS — I442 Atrioventricular block, complete: Secondary | ICD-10-CM | POA: Diagnosis not present

## 2019-02-11 DIAGNOSIS — I2510 Atherosclerotic heart disease of native coronary artery without angina pectoris: Secondary | ICD-10-CM | POA: Diagnosis not present

## 2019-02-11 DIAGNOSIS — S72142D Displaced intertrochanteric fracture of left femur, subsequent encounter for closed fracture with routine healing: Secondary | ICD-10-CM | POA: Diagnosis not present

## 2019-02-11 DIAGNOSIS — I10 Essential (primary) hypertension: Secondary | ICD-10-CM | POA: Diagnosis not present

## 2019-02-12 DIAGNOSIS — I2510 Atherosclerotic heart disease of native coronary artery without angina pectoris: Secondary | ICD-10-CM | POA: Diagnosis not present

## 2019-02-12 DIAGNOSIS — S72142D Displaced intertrochanteric fracture of left femur, subsequent encounter for closed fracture with routine healing: Secondary | ICD-10-CM | POA: Diagnosis not present

## 2019-02-12 DIAGNOSIS — I10 Essential (primary) hypertension: Secondary | ICD-10-CM | POA: Diagnosis not present

## 2019-02-12 DIAGNOSIS — G309 Alzheimer's disease, unspecified: Secondary | ICD-10-CM | POA: Diagnosis not present

## 2019-02-12 DIAGNOSIS — I4821 Permanent atrial fibrillation: Secondary | ICD-10-CM | POA: Diagnosis not present

## 2019-02-12 DIAGNOSIS — I442 Atrioventricular block, complete: Secondary | ICD-10-CM | POA: Diagnosis not present

## 2019-02-13 ENCOUNTER — Other Ambulatory Visit: Payer: Self-pay | Admitting: Cardiology

## 2019-02-13 DIAGNOSIS — I2510 Atherosclerotic heart disease of native coronary artery without angina pectoris: Secondary | ICD-10-CM | POA: Diagnosis not present

## 2019-02-13 DIAGNOSIS — S72142D Displaced intertrochanteric fracture of left femur, subsequent encounter for closed fracture with routine healing: Secondary | ICD-10-CM | POA: Diagnosis not present

## 2019-02-13 DIAGNOSIS — I4821 Permanent atrial fibrillation: Secondary | ICD-10-CM | POA: Diagnosis not present

## 2019-02-13 DIAGNOSIS — I10 Essential (primary) hypertension: Secondary | ICD-10-CM | POA: Diagnosis not present

## 2019-02-13 DIAGNOSIS — G309 Alzheimer's disease, unspecified: Secondary | ICD-10-CM | POA: Diagnosis not present

## 2019-02-13 DIAGNOSIS — I442 Atrioventricular block, complete: Secondary | ICD-10-CM | POA: Diagnosis not present

## 2019-02-17 DIAGNOSIS — S72142D Displaced intertrochanteric fracture of left femur, subsequent encounter for closed fracture with routine healing: Secondary | ICD-10-CM | POA: Diagnosis not present

## 2019-02-17 DIAGNOSIS — I10 Essential (primary) hypertension: Secondary | ICD-10-CM | POA: Diagnosis not present

## 2019-02-17 DIAGNOSIS — I442 Atrioventricular block, complete: Secondary | ICD-10-CM | POA: Diagnosis not present

## 2019-02-17 DIAGNOSIS — I4821 Permanent atrial fibrillation: Secondary | ICD-10-CM | POA: Diagnosis not present

## 2019-02-17 DIAGNOSIS — I2510 Atherosclerotic heart disease of native coronary artery without angina pectoris: Secondary | ICD-10-CM | POA: Diagnosis not present

## 2019-02-17 DIAGNOSIS — G309 Alzheimer's disease, unspecified: Secondary | ICD-10-CM | POA: Diagnosis not present

## 2019-02-20 DIAGNOSIS — I442 Atrioventricular block, complete: Secondary | ICD-10-CM | POA: Diagnosis not present

## 2019-02-20 DIAGNOSIS — I2510 Atherosclerotic heart disease of native coronary artery without angina pectoris: Secondary | ICD-10-CM | POA: Diagnosis not present

## 2019-02-20 DIAGNOSIS — S72142D Displaced intertrochanteric fracture of left femur, subsequent encounter for closed fracture with routine healing: Secondary | ICD-10-CM | POA: Diagnosis not present

## 2019-02-20 DIAGNOSIS — I10 Essential (primary) hypertension: Secondary | ICD-10-CM | POA: Diagnosis not present

## 2019-02-20 DIAGNOSIS — G309 Alzheimer's disease, unspecified: Secondary | ICD-10-CM | POA: Diagnosis not present

## 2019-02-20 DIAGNOSIS — I4821 Permanent atrial fibrillation: Secondary | ICD-10-CM | POA: Diagnosis not present

## 2019-02-21 DIAGNOSIS — R3915 Urgency of urination: Secondary | ICD-10-CM | POA: Diagnosis not present

## 2019-02-21 DIAGNOSIS — R350 Frequency of micturition: Secondary | ICD-10-CM | POA: Diagnosis not present

## 2019-02-21 DIAGNOSIS — G309 Alzheimer's disease, unspecified: Secondary | ICD-10-CM | POA: Diagnosis not present

## 2019-02-21 DIAGNOSIS — N401 Enlarged prostate with lower urinary tract symptoms: Secondary | ICD-10-CM | POA: Diagnosis not present

## 2019-02-21 DIAGNOSIS — E7800 Pure hypercholesterolemia, unspecified: Secondary | ICD-10-CM | POA: Diagnosis not present

## 2019-02-21 DIAGNOSIS — I442 Atrioventricular block, complete: Secondary | ICD-10-CM | POA: Diagnosis not present

## 2019-02-21 DIAGNOSIS — Z951 Presence of aortocoronary bypass graft: Secondary | ICD-10-CM | POA: Diagnosis not present

## 2019-02-21 DIAGNOSIS — I712 Thoracic aortic aneurysm, without rupture: Secondary | ICD-10-CM | POA: Diagnosis not present

## 2019-02-21 DIAGNOSIS — Z950 Presence of cardiac pacemaker: Secondary | ICD-10-CM | POA: Diagnosis not present

## 2019-02-21 DIAGNOSIS — Z9181 History of falling: Secondary | ICD-10-CM | POA: Diagnosis not present

## 2019-02-21 DIAGNOSIS — I4821 Permanent atrial fibrillation: Secondary | ICD-10-CM | POA: Diagnosis not present

## 2019-02-21 DIAGNOSIS — Z7982 Long term (current) use of aspirin: Secondary | ICD-10-CM | POA: Diagnosis not present

## 2019-02-21 DIAGNOSIS — I714 Abdominal aortic aneurysm, without rupture: Secondary | ICD-10-CM | POA: Diagnosis not present

## 2019-02-21 DIAGNOSIS — S72142D Displaced intertrochanteric fracture of left femur, subsequent encounter for closed fracture with routine healing: Secondary | ICD-10-CM | POA: Diagnosis not present

## 2019-02-21 DIAGNOSIS — W19XXXD Unspecified fall, subsequent encounter: Secondary | ICD-10-CM | POA: Diagnosis not present

## 2019-02-21 DIAGNOSIS — I2510 Atherosclerotic heart disease of native coronary artery without angina pectoris: Secondary | ICD-10-CM | POA: Diagnosis not present

## 2019-02-21 DIAGNOSIS — I10 Essential (primary) hypertension: Secondary | ICD-10-CM | POA: Diagnosis not present

## 2019-02-21 DIAGNOSIS — F0280 Dementia in other diseases classified elsewhere without behavioral disturbance: Secondary | ICD-10-CM | POA: Diagnosis not present

## 2019-02-24 DIAGNOSIS — I2510 Atherosclerotic heart disease of native coronary artery without angina pectoris: Secondary | ICD-10-CM | POA: Diagnosis not present

## 2019-02-24 DIAGNOSIS — I442 Atrioventricular block, complete: Secondary | ICD-10-CM | POA: Diagnosis not present

## 2019-02-24 DIAGNOSIS — S72142D Displaced intertrochanteric fracture of left femur, subsequent encounter for closed fracture with routine healing: Secondary | ICD-10-CM | POA: Diagnosis not present

## 2019-02-24 DIAGNOSIS — G309 Alzheimer's disease, unspecified: Secondary | ICD-10-CM | POA: Diagnosis not present

## 2019-02-24 DIAGNOSIS — I10 Essential (primary) hypertension: Secondary | ICD-10-CM | POA: Diagnosis not present

## 2019-02-24 DIAGNOSIS — I4821 Permanent atrial fibrillation: Secondary | ICD-10-CM | POA: Diagnosis not present

## 2019-02-25 DIAGNOSIS — G309 Alzheimer's disease, unspecified: Secondary | ICD-10-CM | POA: Diagnosis not present

## 2019-02-25 DIAGNOSIS — S72142D Displaced intertrochanteric fracture of left femur, subsequent encounter for closed fracture with routine healing: Secondary | ICD-10-CM | POA: Diagnosis not present

## 2019-02-25 DIAGNOSIS — I2510 Atherosclerotic heart disease of native coronary artery without angina pectoris: Secondary | ICD-10-CM | POA: Diagnosis not present

## 2019-02-25 DIAGNOSIS — I442 Atrioventricular block, complete: Secondary | ICD-10-CM | POA: Diagnosis not present

## 2019-02-25 DIAGNOSIS — I10 Essential (primary) hypertension: Secondary | ICD-10-CM | POA: Diagnosis not present

## 2019-02-25 DIAGNOSIS — I4821 Permanent atrial fibrillation: Secondary | ICD-10-CM | POA: Diagnosis not present

## 2019-02-27 DIAGNOSIS — I4821 Permanent atrial fibrillation: Secondary | ICD-10-CM | POA: Diagnosis not present

## 2019-02-27 DIAGNOSIS — I10 Essential (primary) hypertension: Secondary | ICD-10-CM | POA: Diagnosis not present

## 2019-02-27 DIAGNOSIS — I442 Atrioventricular block, complete: Secondary | ICD-10-CM | POA: Diagnosis not present

## 2019-02-27 DIAGNOSIS — G309 Alzheimer's disease, unspecified: Secondary | ICD-10-CM | POA: Diagnosis not present

## 2019-02-27 DIAGNOSIS — S72142D Displaced intertrochanteric fracture of left femur, subsequent encounter for closed fracture with routine healing: Secondary | ICD-10-CM | POA: Diagnosis not present

## 2019-02-27 DIAGNOSIS — I2510 Atherosclerotic heart disease of native coronary artery without angina pectoris: Secondary | ICD-10-CM | POA: Diagnosis not present

## 2019-03-03 DIAGNOSIS — S72142D Displaced intertrochanteric fracture of left femur, subsequent encounter for closed fracture with routine healing: Secondary | ICD-10-CM | POA: Diagnosis not present

## 2019-03-03 DIAGNOSIS — I10 Essential (primary) hypertension: Secondary | ICD-10-CM | POA: Diagnosis not present

## 2019-03-03 DIAGNOSIS — I442 Atrioventricular block, complete: Secondary | ICD-10-CM | POA: Diagnosis not present

## 2019-03-03 DIAGNOSIS — I4821 Permanent atrial fibrillation: Secondary | ICD-10-CM | POA: Diagnosis not present

## 2019-03-03 DIAGNOSIS — G309 Alzheimer's disease, unspecified: Secondary | ICD-10-CM | POA: Diagnosis not present

## 2019-03-03 DIAGNOSIS — I2510 Atherosclerotic heart disease of native coronary artery without angina pectoris: Secondary | ICD-10-CM | POA: Diagnosis not present

## 2019-03-05 DIAGNOSIS — I4821 Permanent atrial fibrillation: Secondary | ICD-10-CM | POA: Diagnosis not present

## 2019-03-05 DIAGNOSIS — S72142D Displaced intertrochanteric fracture of left femur, subsequent encounter for closed fracture with routine healing: Secondary | ICD-10-CM | POA: Diagnosis not present

## 2019-03-05 DIAGNOSIS — I10 Essential (primary) hypertension: Secondary | ICD-10-CM | POA: Diagnosis not present

## 2019-03-05 DIAGNOSIS — I442 Atrioventricular block, complete: Secondary | ICD-10-CM | POA: Diagnosis not present

## 2019-03-05 DIAGNOSIS — I2510 Atherosclerotic heart disease of native coronary artery without angina pectoris: Secondary | ICD-10-CM | POA: Diagnosis not present

## 2019-03-05 DIAGNOSIS — G309 Alzheimer's disease, unspecified: Secondary | ICD-10-CM | POA: Diagnosis not present

## 2019-03-11 DIAGNOSIS — Z85828 Personal history of other malignant neoplasm of skin: Secondary | ICD-10-CM | POA: Diagnosis not present

## 2019-03-11 DIAGNOSIS — D485 Neoplasm of uncertain behavior of skin: Secondary | ICD-10-CM | POA: Diagnosis not present

## 2019-03-11 DIAGNOSIS — L821 Other seborrheic keratosis: Secondary | ICD-10-CM | POA: Diagnosis not present

## 2019-03-11 DIAGNOSIS — L819 Disorder of pigmentation, unspecified: Secondary | ICD-10-CM | POA: Diagnosis not present

## 2019-03-11 DIAGNOSIS — C44311 Basal cell carcinoma of skin of nose: Secondary | ICD-10-CM | POA: Diagnosis not present

## 2019-03-12 DIAGNOSIS — S72142D Displaced intertrochanteric fracture of left femur, subsequent encounter for closed fracture with routine healing: Secondary | ICD-10-CM | POA: Diagnosis not present

## 2019-03-12 DIAGNOSIS — I4821 Permanent atrial fibrillation: Secondary | ICD-10-CM | POA: Diagnosis not present

## 2019-03-12 DIAGNOSIS — I442 Atrioventricular block, complete: Secondary | ICD-10-CM | POA: Diagnosis not present

## 2019-03-12 DIAGNOSIS — I2510 Atherosclerotic heart disease of native coronary artery without angina pectoris: Secondary | ICD-10-CM | POA: Diagnosis not present

## 2019-03-12 DIAGNOSIS — G309 Alzheimer's disease, unspecified: Secondary | ICD-10-CM | POA: Diagnosis not present

## 2019-03-12 DIAGNOSIS — I10 Essential (primary) hypertension: Secondary | ICD-10-CM | POA: Diagnosis not present

## 2019-03-13 ENCOUNTER — Encounter: Payer: Self-pay | Admitting: Podiatry

## 2019-03-13 ENCOUNTER — Ambulatory Visit (INDEPENDENT_AMBULATORY_CARE_PROVIDER_SITE_OTHER): Payer: Medicare Other | Admitting: Podiatry

## 2019-03-13 ENCOUNTER — Other Ambulatory Visit: Payer: Self-pay

## 2019-03-13 DIAGNOSIS — M79675 Pain in left toe(s): Secondary | ICD-10-CM | POA: Diagnosis not present

## 2019-03-13 DIAGNOSIS — B351 Tinea unguium: Secondary | ICD-10-CM | POA: Diagnosis not present

## 2019-03-13 DIAGNOSIS — M79674 Pain in right toe(s): Secondary | ICD-10-CM

## 2019-03-13 NOTE — Progress Notes (Signed)
Complaint:  Visit Type: Patient presents  to my office for  preventative foot care services. Complaint: Patient states" my nails have grown long and thick and become painful to walk and wear shoes"  The patient presents for preventative foot care services.   Podiatric Exam: Vascular: dorsalis pedis and posterior tibial pulses are palpable bilateral. Capillary return is immediate. Temperature gradient is WNL. Skin turgor WNL  Sensorium: Normal Semmes Weinstein monofilament test. Normal tactile sensation bilaterally. Nail Exam: Pt has thick disfigured discolored nails with subungual debris noted bilateral entire nail hallux through fifth toenails Ulcer Exam: There is no evidence of ulcer or pre-ulcerative changes or infection. Orthopedic Exam: Muscle tone and strength are WNL. No limitations in general ROM. No crepitus or effusions noted. Foot type and digits show no abnormalities. Bony prominences are unremarkable. Skin: No Porokeratosis. No infection or ulcers  Diagnosis:  Onychomycosis, , Pain in right toe, pain in left toes  Treatment & Plan Procedures and Treatment: Consent by patient was obtained for treatment procedures.   Debridement of mycotic and hypertrophic toenails, 1 through 5 bilateral and clearing of subungual debris. No ulceration, no infection noted.  Return Visit-Office Procedure: Patient instructed to return to the office for a follow up visit 3 months for continued evaluation and treatment.    Undra Harriman DPM 

## 2019-03-19 ENCOUNTER — Ambulatory Visit: Payer: Medicare Other | Admitting: Orthopaedic Surgery

## 2019-03-19 ENCOUNTER — Ambulatory Visit: Payer: Self-pay | Admitting: Family Medicine

## 2019-03-20 ENCOUNTER — Encounter: Payer: Self-pay | Admitting: Orthopaedic Surgery

## 2019-03-20 ENCOUNTER — Ambulatory Visit (INDEPENDENT_AMBULATORY_CARE_PROVIDER_SITE_OTHER): Payer: Medicare Other

## 2019-03-20 ENCOUNTER — Ambulatory Visit (INDEPENDENT_AMBULATORY_CARE_PROVIDER_SITE_OTHER): Payer: Medicare Other | Admitting: Physician Assistant

## 2019-03-20 VITALS — Ht 75.0 in | Wt 143.0 lb

## 2019-03-20 DIAGNOSIS — M25552 Pain in left hip: Secondary | ICD-10-CM | POA: Diagnosis not present

## 2019-03-20 DIAGNOSIS — S72142D Displaced intertrochanteric fracture of left femur, subsequent encounter for closed fracture with routine healing: Secondary | ICD-10-CM

## 2019-03-20 NOTE — Progress Notes (Signed)
Post-Op Visit Note   Patient: [name] [name]           Date of Birth: [birth year]           MRN: [id number] Visit Date: 03/20/2019 PCP: [name], MD   Assessment & Plan:  Chief Complaint:  Chief Complaint  Patient presents with  . Left Hip - Routine Post Op    12/26/2018 left Hip IM   Visit Diagnoses:  1. Closed displaced intertrochanteric fracture of left femur with routine healing, subsequent encounter     Plan: Patient is a pleasant 85 year old gentleman who presents our clinic today 84 days status post left hip intramedullary nail from an intertrochanteric femur fracture, date of surgery 12/26/2018.  He has been doing well.  He has been working at home with home health physical therapy.  He denies any pain to the left hip.  No fevers or chills.  Examination of his left hip reveals a fully healed surgical scar.  He has full range of motion.  He is neurovascular intact distally.  X-rays demonstrate healing of the fracture.  At this point, he will continue with his home health physical therapy.  He will follow-up with [place] in 6 weeks time for repeat evaluation and 2 view x-rays of the left femur.  Call with concerns or questions.  Follow-Up Instructions: Return in about 6 weeks (around 05/01/2019).   Orders:  Orders Placed This Encounter  Procedures  . XR HIP UNILAT W OR W/O PELVIS 2-3 VIEWS LEFT  . XR FEMUR MIN 2 VIEWS LEFT   No orders of the defined types were placed in this encounter.   Imaging: Xr Femur Min 2 Views Left  Result Date: 03/20/2019 X-rays demonstrate stable fixation with evidence of callus formation  Xr Hip Unilat W Or W/o Pelvis 2-3 Views Left  Result Date: 03/20/2019 X-rays demonstrate stable fixation with evidence of callus formation   PMFS History: Patient Active Problem List   Diagnosis Date Noted  . Urinary urgency 01/01/2019  . Candidiasis 01/01/2019  . Closed displaced intertrochanteric fracture of left femur ([place]) 12/25/2018  . Closed  intertrochanteric fracture of hip, left, initial encounter ([place]) 12/25/2018  . Pain due to onychomycosis of toenails of both feet 12/12/2018  . Acute blood loss anemia 06/08/2018  . GI bleed 06/05/2018  . HTN (hypertension)   . High cholesterol   . Diverticulosis   . CAD (coronary artery disease)   . Lower GI bleed   . AAA (abdominal aortic aneurysm) without rupture ([place]) 01/09/2018  . Ascending aortic aneurysm ([place]) 01/09/2018  . Heart block AV second degree 07/15/2017  . Status post three vessel coronary artery bypass 07/15/2017  . Left ventricular systolic dysfunction without heart failure 05/11/2017  . Hypercholesterolemia 05/11/2017  . CHB (complete heart block) ([place]) 06/07/2015  . Alzheimer's disease ([place]) 01/26/2015  . Pacemaker 01/26/2013  . Permanent atrial fibrillation ([place]) 01/26/2013  . Second degree AV block, Mobitz type II 01/26/2013  . Coronary artery disease involving native coronary artery of native heart without angina pectoris 01/26/2013  . Dyspnea 01/26/2013   Past Medical History:  Diagnosis Date  . Atrial fibrillation, permanent   . CAD (coronary artery disease)   . Dementia ([place])   . Diverticulosis    diverticular bleed 2011  . High cholesterol   . HTN (hypertension)    has improved over time, no rx as of 2019  . Lower GI bleed     Family History  Problem Relation Age  of Onset  . Stroke Father   . Cervical cancer Other        siblings  . COPD Sister   . Dementia Brother     Past Surgical History:  Procedure Laterality Date  . angiolplasty    . APPENDECTOMY    . INSERT / REPLACE / REMOVE PACEMAKER    . INTRAMEDULLARY (IM) NAIL INTERTROCHANTERIC Left 12/26/2018   Procedure: INTRAMEDULLARY (IM) NAIL INTERTROCHANTRIC;  Surgeon: Leandrew Koyanagi, MD;  Location: [facility];  Service: Orthopedics;  Laterality: Left;  . KNEE SURGERY    . PILONIDAL CYST EXCISION    . rotator cuff surgery  1985  . TONSILLECTOMY    . triple heart bypass     Social History    Occupational History  . Occupation: retired  Tobacco Use  . Smoking status: Former Smoker    Quit date: 07/02/1974    Years since quitting: 44.7  . Smokeless tobacco: Never Used  Substance and Sexual Activity  . Alcohol use: Not Currently    Comment: last used in dec 2019  . Drug use: No  . Sexual activity: Not on file

## 2019-03-23 DIAGNOSIS — F0280 Dementia in other diseases classified elsewhere without behavioral disturbance: Secondary | ICD-10-CM | POA: Diagnosis not present

## 2019-03-23 DIAGNOSIS — R358 Other polyuria: Secondary | ICD-10-CM | POA: Diagnosis not present

## 2019-03-23 DIAGNOSIS — N401 Enlarged prostate with lower urinary tract symptoms: Secondary | ICD-10-CM | POA: Diagnosis not present

## 2019-03-23 DIAGNOSIS — R3915 Urgency of urination: Secondary | ICD-10-CM | POA: Diagnosis not present

## 2019-03-23 DIAGNOSIS — Z950 Presence of cardiac pacemaker: Secondary | ICD-10-CM | POA: Diagnosis not present

## 2019-03-23 DIAGNOSIS — I712 Thoracic aortic aneurysm, without rupture: Secondary | ICD-10-CM | POA: Diagnosis not present

## 2019-03-23 DIAGNOSIS — S72142D Displaced intertrochanteric fracture of left femur, subsequent encounter for closed fracture with routine healing: Secondary | ICD-10-CM | POA: Diagnosis not present

## 2019-03-23 DIAGNOSIS — G309 Alzheimer's disease, unspecified: Secondary | ICD-10-CM | POA: Diagnosis not present

## 2019-03-23 DIAGNOSIS — I2510 Atherosclerotic heart disease of native coronary artery without angina pectoris: Secondary | ICD-10-CM | POA: Diagnosis not present

## 2019-03-23 DIAGNOSIS — I714 Abdominal aortic aneurysm, without rupture: Secondary | ICD-10-CM | POA: Diagnosis not present

## 2019-03-23 DIAGNOSIS — W19XXXD Unspecified fall, subsequent encounter: Secondary | ICD-10-CM | POA: Diagnosis not present

## 2019-03-23 DIAGNOSIS — I4821 Permanent atrial fibrillation: Secondary | ICD-10-CM | POA: Diagnosis not present

## 2019-03-23 DIAGNOSIS — E7800 Pure hypercholesterolemia, unspecified: Secondary | ICD-10-CM | POA: Diagnosis not present

## 2019-03-23 DIAGNOSIS — I10 Essential (primary) hypertension: Secondary | ICD-10-CM | POA: Diagnosis not present

## 2019-03-23 DIAGNOSIS — I442 Atrioventricular block, complete: Secondary | ICD-10-CM | POA: Diagnosis not present

## 2019-03-23 DIAGNOSIS — Z9181 History of falling: Secondary | ICD-10-CM | POA: Diagnosis not present

## 2019-03-23 DIAGNOSIS — Z951 Presence of aortocoronary bypass graft: Secondary | ICD-10-CM | POA: Diagnosis not present

## 2019-03-25 ENCOUNTER — Encounter: Payer: Self-pay | Admitting: Family Medicine

## 2019-03-25 ENCOUNTER — Other Ambulatory Visit: Payer: Self-pay

## 2019-03-25 ENCOUNTER — Ambulatory Visit (INDEPENDENT_AMBULATORY_CARE_PROVIDER_SITE_OTHER): Payer: Medicare Other | Admitting: Family Medicine

## 2019-03-25 DIAGNOSIS — F0390 Unspecified dementia without behavioral disturbance: Secondary | ICD-10-CM | POA: Diagnosis not present

## 2019-03-25 MED ORDER — DONEPEZIL HCL 5 MG PO TABS: 5.0000 mg | ORAL_TABLET | Freq: Two times a day (BID) | ORAL | 3 refills | Status: DC

## 2019-03-25 NOTE — Patient Instructions (Signed)
Continue Aricept 5mg  twice daily as prescribed  We will consider Namenda in the future if you wish  Follow up closely with PCP, cardiology and orthopedics  Follow up with [place] in 3 months   Memory Compensation Strategies  1. Use "WARM" strategy.  W= write it down  A= associate it  R= repeat it  M= make a mental note  2.   You can keep a Social worker.  Use a 3-ring notebook with sections for the following: calendar, important names and phone numbers,  medications, doctors' names/phone numbers, lists/reminders, and a section to journal what you did  each day.   3.    Use a calendar to write appointments down.  4.    Write yourself a schedule for the day.  This can be placed on the calendar or in a separate section of the Memory Notebook.  Keeping a  regular schedule can help memory.  5.    Use medication organizer with sections for each day or morning/evening pills.  You may need help loading it  6.    Keep a basket, or pegboard by the door.  Place items that you need to take out with you in the basket or on the pegboard.  You may also want to  include a message board for reminders.  7.    Use sticky notes.  Place sticky notes with reminders in a place where the task is performed.  For example: " turn off the  stove" placed by the stove, "lock the door" placed on the door at eye level, " take your medications" on  the bathroom mirror or by the place where you normally take your medications.  8.    Use alarms/timers.  Use while cooking to remind yourself to check on food or as a reminder to take your medicine, or as a  reminder to make a call, or as a reminder to perform another task, etc.   Memantine Tablets What is this medicine? MEMANTINE (MEM an teen) is used to treat dementia caused by Alzheimer's disease. This medicine may be used for other purposes; ask your health care provider or pharmacist if you have questions. COMMON BRAND NAME(S): Namenda What should I tell my  health care provider before I take this medicine? They need to know if you have any of these conditions:  difficulty passing urine  kidney disease  liver disease  seizures  an unusual or allergic reaction to memantine, other medicines, foods, dyes, or preservatives  pregnant or trying to get pregnant  breast-feeding How should I use this medicine? Take this medicine by mouth with a glass of water. Follow the directions on the prescription label. You may take this medicine with or without food. Take your doses at regular intervals. Do not take your medicine more often than directed. Continue to take your medicine even if you feel better. Do not stop taking except on the advice of your doctor or health care professional. Talk to your pediatrician regarding the use of this medicine in children. Special care may be needed. Overdosage: If you think you have taken too much of this medicine contact a poison control center or emergency room at once. NOTE: This medicine is only for you. Do not share this medicine with others. What if I miss a dose? If you miss a dose, take it as soon as you can. If it is almost time for your next dose, take only that dose. Do not take double or extra doses.  If you do not take your medicine for several days, contact your health care provider. Your dose may need to be changed. What may interact with this medicine?  acetazolamide  amantadine  cimetidine  dextromethorphan  dofetilide  hydrochlorothiazide  ketamine  metformin  methazolamide  quinidine  ranitidine  sodium bicarbonate  triamterene This list may not describe all possible interactions. Give your health care provider a list of all the medicines, herbs, non-prescription drugs, or dietary supplements you use. Also tell them if you smoke, drink alcohol, or use illegal drugs. Some items may interact with your medicine. What should I watch for while using this medicine? Visit your doctor  or health care professional for regular checks on your progress. Check with your doctor or health care professional if there is no improvement in your symptoms or if they get worse. You may get drowsy or dizzy. Do not drive, use machinery, or do anything that needs mental alertness until you know how this drug affects you. Do not stand or sit up quickly, especially if you are an older patient. This reduces the risk of dizzy or fainting spells. Alcohol can make you more drowsy and dizzy. Avoid alcoholic drinks. What side effects may I notice from receiving this medicine? Side effects that you should report to your doctor or health care professional as soon as possible:  allergic reactions like skin rash, itching or hives, swelling of the face, lips, or tongue  agitation or a feeling of restlessness  depressed mood  dizziness  hallucinations  redness, blistering, peeling or loosening of the skin, including inside the mouth  seizures  vomiting Side effects that usually do not require medical attention (report to your doctor or health care professional if they continue or are bothersome):  constipation  diarrhea  headache  nausea  trouble sleeping This list may not describe all possible side effects. Call your doctor for medical advice about side effects. You may report side effects to FDA at 1-800-FDA-1088. Where should I keep my medicine? Keep out of the reach of children. Store at room temperature between 15 degrees and 30 degrees C (59 degrees and 86 degrees F). Throw away any unused medicine after the expiration date. NOTE: This sheet is a summary. It may not cover all possible information. If you have questions about this medicine, talk to your doctor, pharmacist, or health care provider.  2020 Elsevier/Gold Standard (2013-04-06 14:10:42)   Dementia Caregiver Guide Dementia is a term used to describe a number of symptoms that affect memory and thinking. The most common  symptoms include:  Memory loss.  Trouble with language and communication.  Trouble concentrating.  Poor judgment.  Problems with reasoning.  Child-like behavior and language.  Extreme anxiety.  Angry outbursts.  Wandering from home or public places. Dementia usually gets worse slowly over time. In the early stages, people with dementia can stay independent and safe with some help. In later stages, they need help with daily tasks such as dressing, grooming, and using the bathroom. How to help the person with dementia cope Dementia can be frightening and confusing. Here are some tips to help the person with dementia cope with changes caused by the disease. General tips  Keep the person on track with his or her routine.  Try to identify areas where the person may need help.  Be supportive, patient, calm, and encouraging.  Gently remind the person that adjusting to changes takes time.  Help with the tasks that the person has asked  for help with.  Keep the person involved in daily tasks and decisions as much as possible.  Encourage conversation, but try not to get frustrated or harried if the person struggles to find words or does not seem to appreciate your help. Communication tips  When the person is talking or seems frustrated, make eye contact and hold the person's hand.  Ask specific questions that need yes or no answers.  Use simple words, short sentences, and a calm voice. Only give one direction at a time.  When offering choices, limit them to just 1 or 2.  Avoid correcting the person in a negative way.  If the person is struggling to find the right words, gently try to help him or her. How to recognize symptoms of stress Symptoms of stress in caregivers include:  Feeling frustrated or angry with the person with dementia.  Denying that the person has dementia or that his or her symptoms will not improve.  Feeling hopeless and unappreciated.  Difficulty  sleeping.  Difficulty concentrating.  Feeling anxious, irritable, or depressed.  Developing stress-related health problems.  Feeling like you have too little time for your own life. Follow these instructions at home:   Make sure that you and the person you are caring for: ? Get regular sleep. ? Exercise regularly. ? Eat regular, nutritious meals. ? Drink enough fluid to keep your urine clear or pale yellow. ? Take over-the-counter and prescription medicines only as told by your health care providers. ? Attend all scheduled health care appointments.  Join a support group with others who are caregivers.  Ask about respite care resources so that you can have a regular break from the stress of caregiving.  Look for signs of stress in yourself and in the person you are caring for. If you notice signs of stress, take steps to manage it.  Consider any safety risks and take steps to avoid them.  Organize medications in a pill box for each day of the week.  Create a plan to handle any legal or financial matters. Get legal or financial advice if needed.  Keep a calendar in a central location to remind the person of appointments or other activities. Tips for reducing the risk of injury  Keep floors clear of clutter. Remove rugs, magazine racks, and floor lamps.  Keep hallways well lit, especially at night.  Put a handrail and nonslip mat in the bathtub or shower.  Put childproof locks on cabinets that contain dangerous items, such as medicines, alcohol, guns, toxic cleaning items, sharp tools or utensils, matches, and lighters.  Put the locks in places where the person cannot see or reach them easily. This will help ensure that the person does not wander out of the house and get lost.  Be prepared for emergencies. Keep a list of emergency phone numbers and addresses in a convenient area.  Remove car keys and lock garage doors so that the person does not try to get in the car and  drive.  Have the person wear a bracelet that tracks locations and identifies the person as having memory problems. This should be worn at all times for safety. Where to find support: Many individuals and organizations offer support. These include:  Support groups for people with dementia and for caregivers.  Counselors or therapists.  Home health care services.  Adult day care centers. Where to find more information [employer]: [employer] Contact a health care provider if:  The person's health is rapidly getting  worse.  You are no longer able to care for the person.  Caring for the person is affecting your physical and emotional health.  The person threatens himself or herself, you, or anyone else. Summary  Dementia is a term used to describe a number of symptoms that affect memory and thinking.  Dementia usually gets worse slowly over time.  Take steps to reduce the person's risk of injury, and to plan for future care.  Caregivers need support, relief from caregiving, and time for their own lives. This information is not intended to replace advice given to you by your health care provider. Make sure you discuss any questions you have with your health care provider. Document Released: 05/22/2016 Document Revised: 05/31/2017 Document Reviewed: 05/22/2016 Elsevier Patient Education  2020 [employer].

## 2019-03-25 NOTE — Progress Notes (Signed)
PATIENT: [name] [name] DOB: 11/13/1925  REASON FOR VISIT: follow up HISTORY FROM: patient  Chief Complaint  Patient presents with   Follow-up    Room 8, with wife. Memory f/u. "no concerns, everything is the same."     HISTORY OF PRESENT ILLNESS: Today 03/25/19 [name] [name] is a 86 y.o. male here today for follow up for dementia. He continues Aricept 5mg  twice daily. He is tolerating medication well with no obvious adverse effects. Never taken Namenda. Family not certain they wish to add medications at this time. They feel that memory is stable. He does not drive. He sleeps in a bedroom on main floor. He has to take a shower upstairs. He has a shower chair. He does have assistance getting in/out but can shower independently. He is able to dress and feed himself. He has gained some weight. Appetite is better.   GI bleed in 06/2018. Anticoagulation was stopped. Cardiology follow up in 01/2019 stable with follow up planned in October. Asa 81mg  restarted. No bleeding or excessive briusing. He had a fall on 12/25/2018. He reports walking on sidewalk and lost balance. No LOC. He had IM nailing on 6/26. He was last seen by ortho on 9/18 and notes show stable fixation and neurovascularly intact. He is scheduled to return in 6 weeks. He continues to work with PT/OT. He was using a walker but recently switched to a 4 point cane. No falls.    HISTORY: (copied from [name] [name]'s note on 12/16/2018)  Chronic atrial fibrillation, status post Gi bleed, heart block, and dementia. The patient appears frail and sleepy- he was able to report stories from his youth in [place].  He has neither fainted not fallen in the last 4 month   HISTORY FROM: The patient and his wife are on the video.   RV 07-16-2018:  I have the pleasure of seeing [name] [name] today here in the presence of his spouse, he is a 85 year old gentleman and his next Thursday will be on 21 February.  [name] [name] was hospitalized after a  fall in the night of 06-05-2018,he had bled- had not passed out. Wife and son couldn't raise him up, the EMS was called. He  had a GI-bleed related to diverticulitis and was on coumadin- lost 11 pounds, anemic, pale. He was in the Ed again after 2 days admission for another admission of 5 days duration. Was very sick. He has forgotten all of it. GI MD is [name] [name].  [name] [name] is followed here on regular intervals for cognitive testing and his Mini-Mental status examination in 2018 had revealed 20 out of 30 points, today he scored 21 out of 30 points.  He is not depressed or at least does not endorse any of these depression related symptoms on a questionnaire he is not excessively fatigued but he is certainly getting frailer.  He has a very different posture now and is more stooped, his neck circumference is 16-1/4 inches and is rather slim for a gentleman of his height 5 foot 10.  His body mass index is 20.4.   HISTORY OF PRESENT ILLNESS: Today 07/09/2016 and is Mini-Mental Status Examination revealed 20 out of 30 points today the last evaluation was 25 out of 30. The patient has been stable since summer 2016 with points between 25 and 24 out of 30. [name]. [name], 85 year old male returns for follow-up. He has history of Alzheimer's dementia. He continues to be able to complete his ADLs independently,  he does not have a driver's license. He is currently on Aricept 5 mg twice a day for GI  Reasons.  His memory score is stable on Aricept alone. He gets his medications from the [place]. He does not exercise his memory with puzzles or crosswords etc. He gets no regular exercise.    Had one recent fall, no injuries resulted. His wife is here confirming his memory is more impaired since last year- just last week they went to the cinema together and watched a movie about [name] [name], as a left the cinema [name]. [name] asked what the movie was about he had forgotten!. He could also not recall the movie when his wife  explained what they had just watched- he believes he was asleep- his wife denied this.  His gait has deteriorated.  [name] [name] grew up in [place] and in [place], [place]. He has a lot of interesting stories about his education at [employer]. in [place] in the 1940's.   I have the pleasure of seeing [name] [name] today. 07-15-2016, We are talking , once gain, about his 6 years in [place] and his long family history with the [place] . His wife is concerned about his short term memory , he is not. MMSE is stable. He will be 89 next month.  No REM behavior.  Had 2 falls I the last 12 month- one while carrying a 25 pound bag of ice melt, refusing any help. One other near fall let to a visit at [facility] ED, was related to arrhythmia. They called [name]. Benn [name] and were told to come to the ED- no arrhythmia proven.   [name]  MMSE - Mini Mental State Exam 07/16/2018 07/15/2017 07/09/2016 01/04/2016 07/05/2015  Orientation to time 2 4 1 2 3   Orientation to Place 4 4 4 3 3   Registration 3 3 3 3 3   Attention/ Calculation 3 2 4 4 4   Attention/Calculation-comments word not numbers - - - -  Recall 1 1 0 1 1  Language- name 2 objects 2 2 2 2 2   Language- repeat 1 1 1 1 1   Language- follow 3 step command 3 3 3 3 3   Language- read & follow direction 1 1 1 1 1   Write a sentence 1 0 1 1 1   Copy design 0 1 0 1 1  Total score 21 22 20 22 23     HISTORY: [name]. [name] is a 85 year old male with a history of dementia. He returns today for follow-up. He is currently taking Aricept 10 mg and tolerating it well. The patient states that his memory is "good." his wife reports that she feels that it has remained the same. The patient is able to complete all ADLs independently.  The patient has a driver's licenses but no longer drives. His wife does all the driving.  He denies having to give up anything due to his memory. The wife states that the patients behavior has not been an issue.   On 01-26-15 the patient scored 25 out of 30 points and  the Mini-Mental Status Examination for the last 12 months he has been very stable on Aricept alone. [name]. Felipa Eth, his primary care physician, asked if Namenda could be added and indeed it could be. I am often combined the 2 medications Aricept and Namenda and they can be taken in the generic form or even in a combination product as one pill called Namziric. I will start [name]. Krupka on a starter pack for once a  day namenda.  He is interested in stopping protonix, and he may get by with Tumms at night. A trial of 4-6 weeks would suffice to show if he is still having reflux.    REVIEW OF SYSTEMS: Out of a complete 14 system review of symptoms, the patient complains only of the following symptoms, memory loss, and all other reviewed systems are negative.   ALLERGIES: Allergies  Allergen Reactions   Amiodarone Other (See Comments)    Had a severe lung infection in 2003   Antihistamines, Diphenhydramine-Type Other (See Comments)    Jittery    HOME MEDICATIONS: Outpatient Medications Prior to Visit  Medication Sig Dispense Refill   aspirin EC 81 MG tablet Take 81 mg by mouth daily.     atorvastatin (LIPITOR) 40 MG tablet Take 40 mg by mouth every evening.      Cholecalciferol (VITAMIN D) 2000 UNITS CAPS Take 2,000 Units by mouth at bedtime.      finasteride (PROSCAR) 5 MG tablet Take 5 mg by mouth every evening.      Multiple Vitamin (MULTIVITAMIN WITH MINERALS) TABS tablet Take 1 tablet by mouth daily.     sertraline (ZOLOFT) 25 MG tablet Take 25 mg by mouth daily.     donepezil (ARICEPT) 5 MG tablet Take 1 tablet (5 mg total) by mouth 2 (two) times daily. 180 tablet 3   AMINO ACIDS-PROTEIN HYDROLYS PO Take 30 mLs by mouth daily.     ferrous sulfate 325 (65 FE) MG EC tablet Take 325 mg by mouth daily with breakfast. Once a day on Mon,Wed,Fri     oxyCODONE-acetaminophen (PERCOCET) 5-325 MG tablet Take 1 tablet by mouth every 6 (six) hours as needed for severe pain. 30 tablet 0    No facility-administered medications prior to visit.     PAST MEDICAL HISTORY: Past Medical History:  Diagnosis Date   Atrial fibrillation, permanent    CAD (coronary artery disease)    Dementia ([place])    Diverticulosis    diverticular bleed 2011   High cholesterol    HTN (hypertension)    has improved over time, no rx as of 2019   Lower GI bleed     PAST SURGICAL HISTORY: Past Surgical History:  Procedure Laterality Date   angiolplasty     APPENDECTOMY     INSERT / REPLACE / REMOVE PACEMAKER     INTRAMEDULLARY (IM) NAIL INTERTROCHANTERIC Left 12/26/2018   Procedure: INTRAMEDULLARY (IM) NAIL INTERTROCHANTRIC;  Surgeon: Leandrew Koyanagi, MD;  Location: [facility];  Service: Orthopedics;  Laterality: Left;   KNEE SURGERY     PILONIDAL CYST EXCISION     rotator cuff surgery  1985   TONSILLECTOMY     triple heart bypass      FAMILY HISTORY: Family History  Problem Relation Age of Onset   Stroke Father    Cervical cancer Other        siblings   COPD Sister    Dementia Brother     SOCIAL HISTORY: Social History   Socioeconomic History   Marital status: Married    Spouse name: [name]   Number of children: 6   Years of education: college   Highest education level: Not on file  Occupational History   Occupation: retired  Scientist, product/process development strain: Not on file   Food insecurity    Worry: Not on file    Inability: Not on file   Transportation needs    Medical: Not on file  Non-medical: Not on file  Tobacco Use   Smoking status: Former Smoker    Quit date: 07/02/1974    Years since quitting: 44.7   Smokeless tobacco: Never Used  Substance and Sexual Activity   Alcohol use: Not Currently    Comment: last used in dec 2019   Drug use: No   Sexual activity: Not on file  Lifestyle   Physical activity    Days per week: Not on file    Minutes per session: Not on file   Stress: Not on file  Relationships   Social  connections    Talks on phone: Not on file    Gets together: Not on file    Attends religious service: Not on file    Active member of club or organization: Not on file    Attends meetings of clubs or organizations: Not on file    Relationship status: Not on file   Intimate partner violence    Fear of current or ex partner: Not on file    Emotionally abused: Not on file    Physically abused: Not on file    Forced sexual activity: Not on file  Other Topics Concern   Not on file  Social History Narrative   Patient is married [name]) and lives at home with his wife.   Patient has a college education.   Patient is a retired Materials engineer.   Patient does not drink any caffeine.   Patient has six children.    PHYSICAL EXAM  Vitals:   03/25/19 1117  BP: 110/68  Pulse: 60  Temp: 97.8 F (36.6 C)  Weight: 145 lb 9.6 oz (66 kg)  Height: 6' (1.829 m)   Body mass index is 19.75 kg/m.  Generalized: Well developed, in no acute distress  Cardiology: normal rate and rhythm, no arrythmia auscultated today, no murmur noted Neurological examination  Mentation: Alert not oriented to time, or place, some history taking. Follows all commands speech and language fluent Cranial nerve II-XII: Pupils were equal round reactive to light. Extraocular movements were full, visual field were full on confrontational test.  Motor: The motor testing reveals 4 over 5 strength of all 4 extremities. Good symmetric motor tone is noted throughout.  Sensory: Sensory testing is intact to soft touch on all 4 extremities. No evidence of extinction is noted.  Coordination: Cerebellar testing reveals good finger-nose-finger and heel-to-shin bilaterally.  Gait and station: Gait is fairly stable with 4 prong cane  DIAGNOSTIC DATA (LABS, IMAGING, TESTING) - I reviewed patient records, labs, notes, testing and imaging myself where available.  MMSE - Mini Mental State Exam 03/25/2019 07/16/2018  07/15/2017  Not completed: (No Data) - -  Orientation to time 2 2 4   Orientation to Place 3 4 4   Registration 3 3 3   Attention/ Calculation 2 3 2   Attention/Calculation-comments - word not numbers -  Recall 0 1 1  Language- name 2 objects 2 2 2   Language- repeat 1 1 1   Language- follow 3 step command 2 3 3   Language- follow 3 step command-comments he didnt place the paper on his lap or the desk - -  Language- read & follow direction 1 1 1   Write a sentence 1 1 0  Copy design 1 0 1  Total score 18 21 22      Lab Results  Component Value Date   WBC 8.8 12/29/2018   HGB 8.8 (A) 01/07/2019   HCT 27 (A) 01/07/2019  MCV 91.5 12/29/2018   PLT 346 01/07/2019      Component Value Date/Time   NA 136 (A) 01/07/2019   K 4.7 01/07/2019   CL 104 12/29/2018 0434   CO2 23 12/29/2018 0434   GLUCOSE 97 12/29/2018 0434   BUN 23 (A) 01/07/2019   CREATININE 1.0 01/07/2019   CREATININE 1.12 12/29/2018 0434   CALCIUM 8.6 (L) 12/29/2018 0434   PROT 7.6 12/25/2018 2106   ALBUMIN 3.1 (L) 12/25/2018 2106   AST 29 12/25/2018 2106   ALT 17 12/25/2018 2106   ALKPHOS 99 12/25/2018 2106   BILITOT 0.2 (L) 12/25/2018 2106   GFRNONAA 57 (L) 12/29/2018 0434   GFRAA >60 12/29/2018 0434   No results found for: CHOL, HDL, LDLCALC, LDLDIRECT, TRIG, CHOLHDL Lab Results  Component Value Date   HGBA1C  12/13/2008    5.5 (NOTE) The ADA recommends the following therapeutic goal for glycemic control related to Hgb A1c measurement: Goal of therapy: <6.5 Hgb A1c  Reference: American Diabetes Association: Clinical Practice Recommendations 2010, Diabetes Care, 2010, 33: (Suppl  1).   No results found for: VITAMINB12 No results found for: TSH    ASSESSMENT AND PLAN 85 y.o. year old male  has a past medical history of Atrial fibrillation, permanent, CAD (coronary artery disease), Dementia ([place]), Diverticulosis, High cholesterol, HTN (hypertension), and Lower GI bleed. here with     ICD-10-CM   1. Dementia  arising in the senium and presenium ([place])  F03.90 donepezil (ARICEPT) 5 MG tablet    Amitai is doing fairly weel and recovering from fall in 12/2018. Follow up with specialist have been stable. He continues to work with PT/OT. Mrs Jarmon feels memory is stable. We will continue Aricept 5mg  twice daily.  MMSE 18 of 30 today, last MMSE 21 of 30.  Information provided to Mrs Stuckey regarding Namenda. May consider in the future. He will continue close follow up with PCP, cardiology and orthopedics. We will follow up closely. 3 month follow up advised. [name] and Mrs Lutes verbalize understanding and agreement with plan.    No orders of the defined types were placed in this encounter.    Meds ordered this encounter  Medications   donepezil (ARICEPT) 5 MG tablet    Sig: Take 1 tablet (5 mg total) by mouth 2 (two) times daily.    Dispense:  180 tablet    Refill:  3    Order Specific Question:   Supervising Provider    Answer:   Melvenia Beam [id number]      I spent 15 minutes with the patient. 50% of this time was spent counseling and educating patient on plan of care and medications.    Debbora Presto, FNP-C 03/25/2019, 1:16 PM [facility] [name] [place], [place] [address] [phone]

## 2019-03-27 DIAGNOSIS — Z23 Encounter for immunization: Secondary | ICD-10-CM | POA: Diagnosis not present

## 2019-04-07 ENCOUNTER — Telehealth: Payer: Self-pay | Admitting: Orthopaedic Surgery

## 2019-04-07 DIAGNOSIS — G309 Alzheimer's disease, unspecified: Secondary | ICD-10-CM | POA: Diagnosis not present

## 2019-04-07 DIAGNOSIS — I10 Essential (primary) hypertension: Secondary | ICD-10-CM | POA: Diagnosis not present

## 2019-04-07 DIAGNOSIS — I2510 Atherosclerotic heart disease of native coronary artery without angina pectoris: Secondary | ICD-10-CM | POA: Diagnosis not present

## 2019-04-07 DIAGNOSIS — S72142D Displaced intertrochanteric fracture of left femur, subsequent encounter for closed fracture with routine healing: Secondary | ICD-10-CM | POA: Diagnosis not present

## 2019-04-07 DIAGNOSIS — I442 Atrioventricular block, complete: Secondary | ICD-10-CM | POA: Diagnosis not present

## 2019-04-07 DIAGNOSIS — I4821 Permanent atrial fibrillation: Secondary | ICD-10-CM | POA: Diagnosis not present

## 2019-04-07 NOTE — Telephone Encounter (Signed)
Princess, [facility], called to let Dr Erlinda Hong know that the patient's wife stated that he fell when they were at their other home.  No reported injuries.  He did well in therapy today.  CB#[phone].  Thank you.

## 2019-04-07 NOTE — Telephone Encounter (Signed)
See message.

## 2019-04-07 NOTE — Telephone Encounter (Signed)
ok 

## 2019-04-08 DIAGNOSIS — D62 Acute posthemorrhagic anemia: Secondary | ICD-10-CM | POA: Diagnosis not present

## 2019-04-08 DIAGNOSIS — Z79899 Other long term (current) drug therapy: Secondary | ICD-10-CM | POA: Diagnosis not present

## 2019-04-08 DIAGNOSIS — G301 Alzheimer's disease with late onset: Secondary | ICD-10-CM | POA: Diagnosis not present

## 2019-04-08 DIAGNOSIS — E7800 Pure hypercholesterolemia, unspecified: Secondary | ICD-10-CM | POA: Diagnosis not present

## 2019-04-08 DIAGNOSIS — I700 Atherosclerosis of aorta: Secondary | ICD-10-CM | POA: Diagnosis not present

## 2019-04-08 DIAGNOSIS — I10 Essential (primary) hypertension: Secondary | ICD-10-CM | POA: Diagnosis not present

## 2019-04-08 DIAGNOSIS — I4819 Other persistent atrial fibrillation: Secondary | ICD-10-CM | POA: Diagnosis not present

## 2019-04-08 DIAGNOSIS — Z23 Encounter for immunization: Secondary | ICD-10-CM | POA: Diagnosis not present

## 2019-04-10 DIAGNOSIS — I4821 Permanent atrial fibrillation: Secondary | ICD-10-CM | POA: Diagnosis not present

## 2019-04-10 DIAGNOSIS — I442 Atrioventricular block, complete: Secondary | ICD-10-CM | POA: Diagnosis not present

## 2019-04-10 DIAGNOSIS — G309 Alzheimer's disease, unspecified: Secondary | ICD-10-CM | POA: Diagnosis not present

## 2019-04-10 DIAGNOSIS — I2510 Atherosclerotic heart disease of native coronary artery without angina pectoris: Secondary | ICD-10-CM | POA: Diagnosis not present

## 2019-04-10 DIAGNOSIS — I10 Essential (primary) hypertension: Secondary | ICD-10-CM | POA: Diagnosis not present

## 2019-04-10 DIAGNOSIS — S72142D Displaced intertrochanteric fracture of left femur, subsequent encounter for closed fracture with routine healing: Secondary | ICD-10-CM | POA: Diagnosis not present

## 2019-04-14 DIAGNOSIS — G309 Alzheimer's disease, unspecified: Secondary | ICD-10-CM | POA: Diagnosis not present

## 2019-04-14 DIAGNOSIS — I442 Atrioventricular block, complete: Secondary | ICD-10-CM | POA: Diagnosis not present

## 2019-04-14 DIAGNOSIS — S72142D Displaced intertrochanteric fracture of left femur, subsequent encounter for closed fracture with routine healing: Secondary | ICD-10-CM | POA: Diagnosis not present

## 2019-04-14 DIAGNOSIS — I4821 Permanent atrial fibrillation: Secondary | ICD-10-CM | POA: Diagnosis not present

## 2019-04-14 DIAGNOSIS — I10 Essential (primary) hypertension: Secondary | ICD-10-CM | POA: Diagnosis not present

## 2019-04-14 DIAGNOSIS — I2510 Atherosclerotic heart disease of native coronary artery without angina pectoris: Secondary | ICD-10-CM | POA: Diagnosis not present

## 2019-04-16 DIAGNOSIS — I442 Atrioventricular block, complete: Secondary | ICD-10-CM | POA: Diagnosis not present

## 2019-04-16 DIAGNOSIS — S72142D Displaced intertrochanteric fracture of left femur, subsequent encounter for closed fracture with routine healing: Secondary | ICD-10-CM | POA: Diagnosis not present

## 2019-04-16 DIAGNOSIS — I2510 Atherosclerotic heart disease of native coronary artery without angina pectoris: Secondary | ICD-10-CM | POA: Diagnosis not present

## 2019-04-16 DIAGNOSIS — I4821 Permanent atrial fibrillation: Secondary | ICD-10-CM | POA: Diagnosis not present

## 2019-04-16 DIAGNOSIS — G309 Alzheimer's disease, unspecified: Secondary | ICD-10-CM | POA: Diagnosis not present

## 2019-04-16 DIAGNOSIS — I10 Essential (primary) hypertension: Secondary | ICD-10-CM | POA: Diagnosis not present

## 2019-04-21 DIAGNOSIS — G309 Alzheimer's disease, unspecified: Secondary | ICD-10-CM | POA: Diagnosis not present

## 2019-04-21 DIAGNOSIS — I4821 Permanent atrial fibrillation: Secondary | ICD-10-CM | POA: Diagnosis not present

## 2019-04-21 DIAGNOSIS — S72142D Displaced intertrochanteric fracture of left femur, subsequent encounter for closed fracture with routine healing: Secondary | ICD-10-CM | POA: Diagnosis not present

## 2019-04-21 DIAGNOSIS — I2510 Atherosclerotic heart disease of native coronary artery without angina pectoris: Secondary | ICD-10-CM | POA: Diagnosis not present

## 2019-04-21 DIAGNOSIS — I442 Atrioventricular block, complete: Secondary | ICD-10-CM | POA: Diagnosis not present

## 2019-04-21 DIAGNOSIS — I10 Essential (primary) hypertension: Secondary | ICD-10-CM | POA: Diagnosis not present

## 2019-04-22 DIAGNOSIS — I2510 Atherosclerotic heart disease of native coronary artery without angina pectoris: Secondary | ICD-10-CM | POA: Diagnosis not present

## 2019-04-22 DIAGNOSIS — E7800 Pure hypercholesterolemia, unspecified: Secondary | ICD-10-CM | POA: Diagnosis not present

## 2019-04-22 DIAGNOSIS — I442 Atrioventricular block, complete: Secondary | ICD-10-CM | POA: Diagnosis not present

## 2019-04-22 DIAGNOSIS — Z951 Presence of aortocoronary bypass graft: Secondary | ICD-10-CM | POA: Diagnosis not present

## 2019-04-22 DIAGNOSIS — Z9181 History of falling: Secondary | ICD-10-CM | POA: Diagnosis not present

## 2019-04-22 DIAGNOSIS — Z950 Presence of cardiac pacemaker: Secondary | ICD-10-CM | POA: Diagnosis not present

## 2019-04-22 DIAGNOSIS — I4821 Permanent atrial fibrillation: Secondary | ICD-10-CM | POA: Diagnosis not present

## 2019-04-22 DIAGNOSIS — G309 Alzheimer's disease, unspecified: Secondary | ICD-10-CM | POA: Diagnosis not present

## 2019-04-22 DIAGNOSIS — S72142D Displaced intertrochanteric fracture of left femur, subsequent encounter for closed fracture with routine healing: Secondary | ICD-10-CM | POA: Diagnosis not present

## 2019-04-22 DIAGNOSIS — R3915 Urgency of urination: Secondary | ICD-10-CM | POA: Diagnosis not present

## 2019-04-22 DIAGNOSIS — I712 Thoracic aortic aneurysm, without rupture: Secondary | ICD-10-CM | POA: Diagnosis not present

## 2019-04-22 DIAGNOSIS — W19XXXD Unspecified fall, subsequent encounter: Secondary | ICD-10-CM | POA: Diagnosis not present

## 2019-04-22 DIAGNOSIS — I10 Essential (primary) hypertension: Secondary | ICD-10-CM | POA: Diagnosis not present

## 2019-04-22 DIAGNOSIS — I714 Abdominal aortic aneurysm, without rupture: Secondary | ICD-10-CM | POA: Diagnosis not present

## 2019-04-22 DIAGNOSIS — N401 Enlarged prostate with lower urinary tract symptoms: Secondary | ICD-10-CM | POA: Diagnosis not present

## 2019-04-22 DIAGNOSIS — R358 Other polyuria: Secondary | ICD-10-CM | POA: Diagnosis not present

## 2019-04-22 DIAGNOSIS — F0280 Dementia in other diseases classified elsewhere without behavioral disturbance: Secondary | ICD-10-CM | POA: Diagnosis not present

## 2019-04-23 DIAGNOSIS — I10 Essential (primary) hypertension: Secondary | ICD-10-CM | POA: Diagnosis not present

## 2019-04-23 DIAGNOSIS — I442 Atrioventricular block, complete: Secondary | ICD-10-CM | POA: Diagnosis not present

## 2019-04-23 DIAGNOSIS — G309 Alzheimer's disease, unspecified: Secondary | ICD-10-CM | POA: Diagnosis not present

## 2019-04-23 DIAGNOSIS — I4821 Permanent atrial fibrillation: Secondary | ICD-10-CM | POA: Diagnosis not present

## 2019-04-23 DIAGNOSIS — I2510 Atherosclerotic heart disease of native coronary artery without angina pectoris: Secondary | ICD-10-CM | POA: Diagnosis not present

## 2019-04-23 DIAGNOSIS — S72142D Displaced intertrochanteric fracture of left femur, subsequent encounter for closed fracture with routine healing: Secondary | ICD-10-CM | POA: Diagnosis not present

## 2019-04-28 DIAGNOSIS — G309 Alzheimer's disease, unspecified: Secondary | ICD-10-CM | POA: Diagnosis not present

## 2019-04-28 DIAGNOSIS — I2510 Atherosclerotic heart disease of native coronary artery without angina pectoris: Secondary | ICD-10-CM | POA: Diagnosis not present

## 2019-04-28 DIAGNOSIS — S72142D Displaced intertrochanteric fracture of left femur, subsequent encounter for closed fracture with routine healing: Secondary | ICD-10-CM | POA: Diagnosis not present

## 2019-04-28 DIAGNOSIS — I4821 Permanent atrial fibrillation: Secondary | ICD-10-CM | POA: Diagnosis not present

## 2019-04-28 DIAGNOSIS — I442 Atrioventricular block, complete: Secondary | ICD-10-CM | POA: Diagnosis not present

## 2019-04-28 DIAGNOSIS — I10 Essential (primary) hypertension: Secondary | ICD-10-CM | POA: Diagnosis not present

## 2019-05-01 ENCOUNTER — Ambulatory Visit: Payer: Medicare Other | Admitting: Orthopaedic Surgery

## 2019-05-05 ENCOUNTER — Ambulatory Visit (INDEPENDENT_AMBULATORY_CARE_PROVIDER_SITE_OTHER): Payer: Medicare Other | Admitting: Orthopaedic Surgery

## 2019-05-05 ENCOUNTER — Other Ambulatory Visit: Payer: Self-pay

## 2019-05-05 ENCOUNTER — Ambulatory Visit (INDEPENDENT_AMBULATORY_CARE_PROVIDER_SITE_OTHER): Payer: Medicare Other

## 2019-05-05 DIAGNOSIS — S72142D Displaced intertrochanteric fracture of left femur, subsequent encounter for closed fracture with routine healing: Secondary | ICD-10-CM

## 2019-05-05 DIAGNOSIS — I25810 Atherosclerosis of coronary artery bypass graft(s) without angina pectoris: Secondary | ICD-10-CM

## 2019-05-05 NOTE — Progress Notes (Signed)
Post-Op Visit Note   Patient: [name] [name]           Date of Birth: 03-27-1926           MRN: [id number] Visit Date: 05/05/2019 PCP: [name], MD   Assessment & Plan:  Chief Complaint:  Chief Complaint  Patient presents with  . Follow-up   Visit Diagnoses:  1. Closed displaced intertrochanteric fracture of left femur with routine healing, subsequent encounter     Plan: Patient is a pleasant 85 year old gentleman who comes in today 4 months status post left hip intramedullary nail from an intertrochanteric femur fracture, date of surgery 12/26/2018.  He has been doing well.  He has been ambulating much better with the use of a cane over the past few weeks.  He is still receiving home health physical therapy.  Examination of his left hip reveals a negative logroll.  He has full range of motion.  He is neurovascularly intact distally.  At this point, he will continue to advance with activity as tolerated.  Follow-up with [place] in 2 months time for repeat evaluation and 2 view x-rays of left femur.  Call with concerns or questions.  Follow-Up Instructions: Return in about 2 months (around 07/05/2019).   Orders:  Orders Placed This Encounter  Procedures  . XR FEMUR MIN 2 VIEWS LEFT   No orders of the defined types were placed in this encounter.   Imaging: Xr Femur Min 2 Views Left  Result Date: 05/05/2019 X-rays demonstrate bony consolidation to the fracture site.     PMFS History: Patient Active Problem List   Diagnosis Date Noted  . Urinary urgency 01/01/2019  . Candidiasis 01/01/2019  . Closed displaced intertrochanteric fracture of left femur ([place]) 12/25/2018  . Closed intertrochanteric fracture of hip, left, initial encounter ([place]) 12/25/2018  . Pain due to onychomycosis of toenails of both feet 12/12/2018  . Acute blood loss anemia 06/08/2018  . GI bleed 06/05/2018  . HTN (hypertension)   . High cholesterol   . Diverticulosis   . CAD (coronary artery disease)    . Lower GI bleed   . AAA (abdominal aortic aneurysm) without rupture ([place]) 01/09/2018  . Ascending aortic aneurysm ([place]) 01/09/2018  . Heart block AV second degree 07/15/2017  . Status post three vessel coronary artery bypass 07/15/2017  . Left ventricular systolic dysfunction without heart failure 05/11/2017  . Hypercholesterolemia 05/11/2017  . CHB (complete heart block) ([place]) 06/07/2015  . Alzheimer's disease ([place]) 01/26/2015  . Pacemaker 01/26/2013  . Permanent atrial fibrillation ([place]) 01/26/2013  . Second degree AV block, Mobitz type II 01/26/2013  . Coronary artery disease involving native coronary artery of native heart without angina pectoris 01/26/2013  . Dyspnea 01/26/2013   Past Medical History:  Diagnosis Date  . Atrial fibrillation, permanent   . CAD (coronary artery disease)   . Dementia ([place])   . Diverticulosis    diverticular bleed 2011  . High cholesterol   . HTN (hypertension)    has improved over time, no rx as of 2019  . Lower GI bleed     Family History  Problem Relation Age of Onset  . Stroke Father   . Cervical cancer Other        siblings  . COPD Sister   . Dementia Brother     Past Surgical History:  Procedure Laterality Date  . angiolplasty    . APPENDECTOMY    . INSERT / REPLACE / REMOVE PACEMAKER    .  INTRAMEDULLARY (IM) NAIL INTERTROCHANTERIC Left 12/26/2018   Procedure: INTRAMEDULLARY (IM) NAIL INTERTROCHANTRIC;  Surgeon: Leandrew Koyanagi, MD;  Location: [facility];  Service: Orthopedics;  Laterality: Left;  . KNEE SURGERY    . PILONIDAL CYST EXCISION    . rotator cuff surgery  1985  . TONSILLECTOMY    . triple heart bypass     Social History   Occupational History  . Occupation: retired  Tobacco Use  . Smoking status: Former Smoker    Quit date: 07/02/1974    Years since quitting: 44.8  . Smokeless tobacco: Never Used  Substance and Sexual Activity  . Alcohol use: Not Currently    Comment: last used in dec 2019  . Drug use: No  .  Sexual activity: Not on file

## 2019-05-08 DIAGNOSIS — S72142D Displaced intertrochanteric fracture of left femur, subsequent encounter for closed fracture with routine healing: Secondary | ICD-10-CM | POA: Diagnosis not present

## 2019-05-08 DIAGNOSIS — I10 Essential (primary) hypertension: Secondary | ICD-10-CM | POA: Diagnosis not present

## 2019-05-08 DIAGNOSIS — I2510 Atherosclerotic heart disease of native coronary artery without angina pectoris: Secondary | ICD-10-CM | POA: Diagnosis not present

## 2019-05-08 DIAGNOSIS — I442 Atrioventricular block, complete: Secondary | ICD-10-CM | POA: Diagnosis not present

## 2019-05-08 DIAGNOSIS — G309 Alzheimer's disease, unspecified: Secondary | ICD-10-CM | POA: Diagnosis not present

## 2019-05-08 DIAGNOSIS — I4821 Permanent atrial fibrillation: Secondary | ICD-10-CM | POA: Diagnosis not present

## 2019-05-14 DIAGNOSIS — I10 Essential (primary) hypertension: Secondary | ICD-10-CM | POA: Diagnosis not present

## 2019-05-14 DIAGNOSIS — I2510 Atherosclerotic heart disease of native coronary artery without angina pectoris: Secondary | ICD-10-CM | POA: Diagnosis not present

## 2019-05-14 DIAGNOSIS — G309 Alzheimer's disease, unspecified: Secondary | ICD-10-CM | POA: Diagnosis not present

## 2019-05-14 DIAGNOSIS — I442 Atrioventricular block, complete: Secondary | ICD-10-CM | POA: Diagnosis not present

## 2019-05-14 DIAGNOSIS — I4821 Permanent atrial fibrillation: Secondary | ICD-10-CM | POA: Diagnosis not present

## 2019-05-14 DIAGNOSIS — S72142D Displaced intertrochanteric fracture of left femur, subsequent encounter for closed fracture with routine healing: Secondary | ICD-10-CM | POA: Diagnosis not present

## 2019-05-21 DIAGNOSIS — I442 Atrioventricular block, complete: Secondary | ICD-10-CM | POA: Diagnosis not present

## 2019-05-21 DIAGNOSIS — I10 Essential (primary) hypertension: Secondary | ICD-10-CM | POA: Diagnosis not present

## 2019-05-21 DIAGNOSIS — G309 Alzheimer's disease, unspecified: Secondary | ICD-10-CM | POA: Diagnosis not present

## 2019-05-21 DIAGNOSIS — S72142D Displaced intertrochanteric fracture of left femur, subsequent encounter for closed fracture with routine healing: Secondary | ICD-10-CM | POA: Diagnosis not present

## 2019-05-21 DIAGNOSIS — I4821 Permanent atrial fibrillation: Secondary | ICD-10-CM | POA: Diagnosis not present

## 2019-05-21 DIAGNOSIS — I2510 Atherosclerotic heart disease of native coronary artery without angina pectoris: Secondary | ICD-10-CM | POA: Diagnosis not present

## 2019-05-22 ENCOUNTER — Ambulatory Visit (INDEPENDENT_AMBULATORY_CARE_PROVIDER_SITE_OTHER): Payer: Medicare Other | Admitting: Podiatry

## 2019-05-22 ENCOUNTER — Other Ambulatory Visit: Payer: Self-pay

## 2019-05-22 ENCOUNTER — Encounter: Payer: Self-pay | Admitting: Podiatry

## 2019-05-22 DIAGNOSIS — B351 Tinea unguium: Secondary | ICD-10-CM | POA: Diagnosis not present

## 2019-05-22 DIAGNOSIS — M79675 Pain in left toe(s): Secondary | ICD-10-CM

## 2019-05-22 DIAGNOSIS — M79674 Pain in right toe(s): Secondary | ICD-10-CM

## 2019-05-22 NOTE — Progress Notes (Addendum)
Complaint:  Visit Type: Patient presents  to my office for  preventative foot care services. Complaint: Patient states" my nails have grown long and thick and become painful to walk and wear shoes"  The patient presents for preventative foot care services.   Podiatric Exam: Vascular: dorsalis pedis and posterior tibial pulses are palpable bilateral. Capillary return is immediate. Temperature gradient is WNL. Skin turgor WNL  Sensorium: Normal Semmes Weinstein monofilament test. Normal tactile sensation bilaterally. Nail Exam: Pt has thick disfigured discolored nails with subungual debris noted bilateral entire nail hallux through fifth toenails Ulcer Exam: There is no evidence of ulcer or pre-ulcerative changes or infection. Orthopedic Exam: Muscle tone and strength are WNL. No limitations in general ROM. No crepitus or effusions noted. Foot type and digits show no abnormalities. Bony prominences are unremarkable. Skin: No Porokeratosis. No infection or ulcers  Diagnosis:  Onychomycosis, , Pain in right toe, pain in left toes  Treatment & Plan Procedures and Treatment: Consent by patient was obtained for treatment procedures.   Debridement of mycotic and hypertrophic toenails, 1 through 5 bilateral and clearing of subungual debris. No ulceration, no infection noted.  Return Visit-Office Procedure: Patient instructed to return to the office for a follow up visit 10 weeks  for continued evaluation and treatment.    Gardiner Barefoot DPM

## 2019-06-03 DIAGNOSIS — C44311 Basal cell carcinoma of skin of nose: Secondary | ICD-10-CM | POA: Diagnosis not present

## 2019-06-19 ENCOUNTER — Ambulatory Visit: Payer: Medicare Other | Admitting: Podiatry

## 2019-07-06 ENCOUNTER — Ambulatory Visit (INDEPENDENT_AMBULATORY_CARE_PROVIDER_SITE_OTHER): Payer: Medicare Other | Admitting: Family Medicine

## 2019-07-06 ENCOUNTER — Encounter: Payer: Self-pay | Admitting: Family Medicine

## 2019-07-06 ENCOUNTER — Other Ambulatory Visit: Payer: Self-pay

## 2019-07-06 VITALS — BP 112/66 | HR 60 | Temp 97.3°F | Ht 71.0 in | Wt 151.4 lb

## 2019-07-06 DIAGNOSIS — G301 Alzheimer's disease with late onset: Secondary | ICD-10-CM | POA: Diagnosis not present

## 2019-07-06 DIAGNOSIS — F0280 Dementia in other diseases classified elsewhere without behavioral disturbance: Secondary | ICD-10-CM

## 2019-07-06 NOTE — Patient Instructions (Signed)
Continue Aricpet 5mg  twice daily  Continue close follow up with PCP and cardiology as directed  Regular physical and mental activity advised, well balanced diet and adequate hydration   Follow up in 6 months, sooner if needed   Dementia Caregiver Guide Dementia is a term used to describe a number of symptoms that affect memory and thinking. The most common symptoms include:  Memory loss.  Trouble with language and communication.  Trouble concentrating.  Poor judgment.  Problems with reasoning.  Child-like behavior and language.  Extreme anxiety.  Angry outbursts.  Wandering from home or public places. Dementia usually gets worse slowly over time. In the early stages, people with dementia can stay independent and safe with some help. In later stages, they need help with daily tasks such as dressing, grooming, and using the bathroom. How to help the person with dementia cope Dementia can be frightening and confusing. Here are some tips to help the person with dementia cope with changes caused by the disease. General tips  Keep the person on track with his or her routine.  Try to identify areas where the person may need help.  Be supportive, patient, calm, and encouraging.  Gently remind the person that adjusting to changes takes time.  Help with the tasks that the person has asked for help with.  Keep the person involved in daily tasks and decisions as much as possible.  Encourage conversation, but try not to get frustrated or harried if the person struggles to find words or does not seem to appreciate your help. Communication tips  When the person is talking or seems frustrated, make eye contact and hold the person's hand.  Ask specific questions that need yes or no answers.  Use simple words, short sentences, and a calm voice. Only give one direction at a time.  When offering choices, limit them to just 1 or 2.  Avoid correcting the person in a negative  way.  If the person is struggling to find the right words, gently try to help him or her. How to recognize symptoms of stress Symptoms of stress in caregivers include:  Feeling frustrated or angry with the person with dementia.  Denying that the person has dementia or that his or her symptoms will not improve.  Feeling hopeless and unappreciated.  Difficulty sleeping.  Difficulty concentrating.  Feeling anxious, irritable, or depressed.  Developing stress-related health problems.  Feeling like you have too little time for your own life. Follow these instructions at home:   Make sure that you and the person you are caring for: ? Get regular sleep. ? Exercise regularly. ? Eat regular, nutritious meals. ? Drink enough fluid to keep your urine clear or pale yellow. ? Take over-the-counter and prescription medicines only as told by your health care providers. ? Attend all scheduled health care appointments.  Join a support group with others who are caregivers.  Ask about respite care resources so that you can have a regular break from the stress of caregiving.  Look for signs of stress in yourself and in the person you are caring for. If you notice signs of stress, take steps to manage it.  Consider any safety risks and take steps to avoid them.  Organize medications in a pill box for each day of the week.  Create a plan to handle any legal or financial matters. Get legal or financial advice if needed.  Keep a calendar in a central location to remind the person of appointments or  other activities. Tips for reducing the risk of injury  Keep floors clear of clutter. Remove rugs, magazine racks, and floor lamps.  Keep hallways well lit, especially at night.  Put a handrail and nonslip mat in the bathtub or shower.  Put childproof locks on cabinets that contain dangerous items, such as medicines, alcohol, guns, toxic cleaning items, sharp tools or utensils, matches, and  lighters.  Put the locks in places where the person cannot see or reach them easily. This will help ensure that the person does not wander out of the house and get lost.  Be prepared for emergencies. Keep a list of emergency phone numbers and addresses in a convenient area.  Remove car keys and lock garage doors so that the person does not try to get in the car and drive.  Have the person wear a bracelet that tracks locations and identifies the person as having memory problems. This should be worn at all times for safety. Where to find support: Many individuals and organizations offer support. These include:  Support groups for people with dementia and for caregivers.  Counselors or therapists.  Home health care services.  Adult day care centers. Where to find more information Alzheimer's Association: [employer] Contact a health care provider if:  The person's health is rapidly getting worse.  You are no longer able to care for the person.  Caring for the person is affecting your physical and emotional health.  The person threatens himself or herself, you, or anyone else. Summary  Dementia is a term used to describe a number of symptoms that affect memory and thinking.  Dementia usually gets worse slowly over time.  Take steps to reduce the person's risk of injury, and to plan for future care.  Caregivers need support, relief from caregiving, and time for their own lives. This information is not intended to replace advice given to you by your health care provider. Make sure you discuss any questions you have with your health care provider. Document Revised: 05/31/2017 Document Reviewed: 05/22/2016 Elsevier Patient Education  2020 [employer].

## 2019-07-06 NOTE — Progress Notes (Signed)
PATIENT: [name] [name] DOB: [birth year]  REASON FOR VISIT: follow up HISTORY FROM: patient  Chief Complaint  Patient presents with  . Follow-up    RM2. with Wife. No changes. No concerns.     HISTORY OF PRESENT ILLNESS: Today 07/06/19 [name] [name] is a 86 y.o. male here today for follow up for dementia. Weight is stable, in fact, he has gained about 6 pounds since last visit with [place]. He is doing well with gait. No falls. Walks with four prong cane. He does like to walk inside home but no dedicated physical exercise. He is able to perform ADL's with minimal assistance. Wife assists with medications.  They do have assistance and support from his son.  No behavioral concerns.  He is followed by primary care regularly.  They have upcoming appointment in January.  HISTORY: (copied from my note on 03/25/2019)  [name] [name] is a 86 y.o. male here today for follow up for dementia. He continues Aricept 5mg  twice daily. He is tolerating medication well with no obvious adverse effects. Never taken Namenda. Family not certain they wish to add medications at this time. They feel that memory is stable. He does not drive. He sleeps in a bedroom on main floor. He has to take a shower upstairs. He has a shower chair. He does have assistance getting in/out but can shower independently. He is able to dress and feed himself. He has gained some weight. Appetite is better.   GI bleed in 06/2018. Anticoagulation was stopped. Cardiology follow up in 01/2019 stable with follow up planned in October. Asa 81mg  restarted. No bleeding or excessive briusing. He had a fall on 12/25/2018. He reports walking on sidewalk and lost balance. No LOC. He had IM nailing on 6/26. He was last seen by ortho on 9/18 and notes show stable fixation and neurovascularly intact. He is scheduled to return in 6 weeks. He continues to work with PT/OT. He was using a walker but recently switched to a 4 point cane. No falls.    HISTORY:  (copied from Dr Dohmeier's note on 12/16/2018)  Chronic atrial fibrillation,status post Gi bleed, heart block, and dementia. The patient appears frail and sleepy- he was able to report stories from his youth in [place].  He has neither fainted not fallen in the last 4 month   HISTORY FROM: The patient and his wife are on the video.  RV 07-16-2018:  I have the pleasure of seeing [name] [name] today here in the presence of his spouse, he is a 86 year old gentleman and his next Thursday will be on 21 February. [name] [name] was hospitalized after a fall in the night of 06-05-2018,he had bled- had not passed out. Wife and son couldn't raise him up, the EMS was called. He had a GI-bleed related to diverticulitis and was on coumadin- lost 11 pounds, anemic, pale. He was in the Ed again after 2 days admission for another admission of 5 days duration. Was very sick. He has forgotten all of it. GI MD is Dr Cristina Gong.  [name] [name] is followed here on regular intervals for cognitive testing and his Mini-Mental status examination in 2018 had revealed 20 out of 30 points, today he scored 21 out of 30 points. He is not depressed or at least does not endorse any of these depression related symptoms on a questionnaire he is not excessively fatigued but he is certainly getting frailer. He has a very different posture now and is  more stooped, his neck circumference is 16-1/4 inches and is rather slim for a gentleman of his height 5 foot 10. His body mass index is 20.4.   HISTORY OF PRESENT ILLNESS: Today 07/09/2016 and is Mini-Mental Status Examination revealed 20 out of 30 points today the last evaluation was 25 out of 30. The patient has been stable since summer 2016 with points between 25 and 24 out of 30. [name] [name], 86 year old male returns for follow-up. He has history of Alzheimer's dementia. He continues to be able to complete his ADLs independently, he does not have a driver's license. He is currently on  Aricept 5 mg twice a day for GI Reasons. His memory score is stable on Aricept alone. He gets his medications from the [place]. He does not exercise his memory with puzzles or crosswords etc. He gets no regular exercise.   Had one recent fall, no injuries resulted. His wife is here confirming his memory is more impaired since last year- just last week they went to the cinema together and watched a movie about Laqueta Linden, as a left the cinema [name] [name] asked what the movie was about he had forgotten!. He could also not recall the movie when his wife explained what they had just watched- he believes he was asleep- his wife denied this. His gait has deteriorated.  [name] [name] grew up in [place] and in [place], [place]. He has a lot of interesting stories about his education at [employer]. in [place] in the 1940's.   I have the pleasure of seeing [name] [name] today. 07-15-2016, We are talking , once gain, about his 6 years in [place] and his long family history with the [place] . His wife is concerned about his short term memory , he is not. MMSE is stable. He will be 89 next month.  No REM behavior. Had 2 falls I the last 12 month- one while carrying a 25 pound bag of ice melt, refusing any help. One other near fall let to a visit at [facility] ED, was related to arrhythmia. They called Dr. Benn Moulder and were told to come to the ED- no arrhythmia proven.  [name]  MMSE - Mini Mental State Exam 07/16/2018 07/15/2017 07/09/2016 01/04/2016 07/05/2015  Orientation to time 2 4 1 2 3   Orientation to Place 4 4 4 3 3   Registration 3 3 3 3 3   Attention/ Calculation 3 2 4 4 4   Attention/Calculation-comments word not numbers - - - -  Recall 1 1 0 1 1  Language- name 2 objects 2 2 2 2 2   Language- repeat 1 1 1 1 1   Language- follow 3 step command 3 3 3 3 3   Language- read & follow direction 1 1 1 1 1   Write a sentence 1 0 1 1 1   Copy design 0 1 0 1 1  Total score 21 22 20 22 23     HISTORY: [name]. [name] is a 86 year old male  with a history of dementia. He returns today for follow-up. He is currently taking Aricept 10 mg and tolerating it well. The patient states that his memory is "good." his wife reports that she feels that it has remained the same. The patient is able to complete all ADLs independently.  The patient has a driver's licenses but no longer drives. His wife does all the driving.  He denies having to give up anything due to his memory. The wife states that the patient's behavior has not been an issue.  On 7-27-16the patient scored 25 out of 30 points and the Mini-Mental Status Examination for the last 12 months he has been very stable on Aricept alone. [name], his primary care physician, asked if Namenda could be added and indeed it could be. I am often combined the 2 medications Aricept and Namenda and they can be taken in the generic form or even in a combination product as one pill called Namziric. I will start [name]. [name] on a starter pack for once a day namenda.  He is interested in stopping protonix, and he may get by with Tumms at night. A trial of 4-6 weeks would suffice to show if he is still having reflux.    REVIEW OF SYSTEMS: Out of a complete 14 system review of symptoms, the patient complains only of the following symptoms, memory loss and all other reviewed systems are negative.  ALLERGIES: Allergies  Allergen Reactions  . Amiodarone Other (See Comments)    Had a severe lung infection in 2003  . Antihistamines, Diphenhydramine-Type Other (See Comments)    Jittery    HOME MEDICATIONS: Outpatient Medications Prior to Visit  Medication Sig Dispense Refill  . aspirin EC 81 MG tablet Take 81 mg by mouth daily.    [name] atorvastatin (LIPITOR) 40 MG tablet Take 40 mg by mouth every evening.     . Cholecalciferol (VITAMIN D) 2000 UNITS CAPS Take 2,000 Units by mouth at bedtime.     . donepezil (ARICEPT) 5 MG tablet Take 1 tablet (5 mg total) by mouth 2 (two) times daily. 180 tablet  3  . ferrous sulfate 325 (65 FE) MG EC tablet Take 325 mg by mouth 3 (three) times a week. OTC    . finasteride (PROSCAR) 5 MG tablet Take 5 mg by mouth every evening.     . Multiple Vitamin (MULTIVITAMIN WITH MINERALS) TABS tablet Take 1 tablet by mouth daily. Multivitamin shakes.    [name] sertraline (ZOLOFT) 25 MG tablet Take 25 mg by mouth daily.     No facility-administered medications prior to visit.    PAST MEDICAL HISTORY: Past Medical History:  Diagnosis Date  . Atrial fibrillation, permanent ([employer])   . CAD (coronary artery disease)   . Dementia ([place])   . Diverticulosis    diverticular bleed 2011  . High cholesterol   . HTN (hypertension)    has improved over time, no rx as of 2019  . Lower GI bleed     PAST SURGICAL HISTORY: Past Surgical History:  Procedure Laterality Date  . angiolplasty    . APPENDECTOMY    . INSERT / REPLACE / REMOVE PACEMAKER    . INTRAMEDULLARY (IM) NAIL INTERTROCHANTERIC Left 12/26/2018   Procedure: INTRAMEDULLARY (IM) NAIL INTERTROCHANTRIC;  Surgeon: Leandrew Koyanagi, MD;  Location: [facility];  Service: Orthopedics;  Laterality: Left;  . KNEE SURGERY    . PILONIDAL CYST EXCISION    . rotator cuff surgery  1985  . TONSILLECTOMY    . triple heart bypass      FAMILY HISTORY: Family History  Problem Relation Age of Onset  . Stroke Father   . Cervical cancer Other        siblings  . COPD Sister   . Dementia Brother     SOCIAL HISTORY: Social History   Socioeconomic History  . Marital status: Married    Spouse name: [name] [name]  . Number of children: 6  . Years of education: college  . Highest education level: Not on file  Occupational History  . Occupation: retired  Tobacco Use  . Smoking status: Former Smoker    Quit date: 07/02/1974    Years since quitting: 45.0  . Smokeless tobacco: Never Used  Substance and Sexual Activity  . Alcohol use: Not Currently    Comment: last used in dec 2019  . Drug use: No  . Sexual activity: Not on file    Other Topics Concern  . Not on file  Social History Narrative   Patient is married [name] [name]) and lives at home with his wife.   Patient has a college education.   Patient is a retired Materials engineer.   Patient does not drink any caffeine.   Patient has six children.   Social Determinants of Health   Financial Resource Strain:   . Difficulty of Paying Living Expenses: Not on file  Food Insecurity:   . Worried About Charity fundraiser in the Last Year: Not on file  . Ran Out of Food in the Last Year: Not on file  Transportation Needs:   . Lack of Transportation (Medical): Not on file  . Lack of Transportation (Non-Medical): Not on file  Physical Activity:   . Days of Exercise per Week: Not on file  . Minutes of Exercise per Session: Not on file  Stress:   . Feeling of Stress : Not on file  Social Connections:   . Frequency of Communication with Friends and Family: Not on file  . Frequency of Social Gatherings with Friends and Family: Not on file  . Attends Religious Services: Not on file  . Active Member of Clubs or Organizations: Not on file  . Attends Archivist Meetings: Not on file  . Marital Status: Not on file  Intimate Partner Violence:   . Fear of Current or Ex-Partner: Not on file  . Emotionally Abused: Not on file  . Physically Abused: Not on file  . Sexually Abused: Not on file      PHYSICAL EXAM  Vitals:   07/06/19 1104  BP: 112/66  Pulse: 60  Temp: (!) 97.3 F (36.3 C)  Weight: 151 lb 6.4 oz (68.7 kg)  Height: 5\' 11"  (1.803 m)   Body mass index is 21.12 kg/m.  Generalized: Well developed, in no acute distress  Cardiology: normal rate and rhythm, no murmur noted Neurological examination  Mentation: Alert, not oriented time but is oriented to place and some history taking. Follows all commands speech and language fluent Cranial nerve II-XII: Pupils were equal round reactive to light. Extraocular movements were full, visual  field were full on confrontational test. Facial sensation and strength were normal. Uvula tongue midline. Head turning and shoulder shrug  were normal and symmetric. Motor: The motor testing reveals 4 over 5 strength of all 4 extremities. Good symmetric motor tone is noted throughout.  Sensory: Sensory testing is intact to soft touch on all 4 extremities. No evidence of extinction is noted.  Coordination: Cerebellar testing reveals good finger-nose-finger and heel-to-shin bilaterally.  Gait and station: able to stand using one arm to push out of chair, Gait is stable with four prong cane, tandem not attempted    DIAGNOSTIC DATA (LABS, IMAGING, TESTING) - I reviewed patient records, labs, notes, testing and imaging myself where available.  MMSE - Mini Mental State Exam 07/06/2019 03/25/2019 07/16/2018  Not completed: - (No Data) -  Orientation to time 0 2 2  Orientation to Place 5 3 4   Registration 3 3 3  Attention/ Calculation 2 2 3   Attention/Calculation-comments - - word not numbers  Recall 0 0 1  Language- name 2 objects 2 2 2   Language- repeat 0 1 1  Language- follow 3 step command 3 2 3   Language- follow 3 step command-comments - he didnt place the paper on his lap or the desk -  Language- read & follow direction 1 1 1   Write a sentence 1 1 1   Copy design 1 1 0  Copy design-comments named 6 animals - -  Total score 18 18 21      Lab Results  Component Value Date   WBC 8.8 12/29/2018   HGB 8.8 (A) 01/07/2019   HCT 27 (A) 01/07/2019   MCV 91.5 12/29/2018   PLT 346 01/07/2019      Component Value Date/Time   NA 136 (A) 01/07/2019 0000   K 4.7 01/07/2019 0000   CL 104 12/29/2018 0434   CO2 23 12/29/2018 0434   GLUCOSE 97 12/29/2018 0434   BUN 23 (A) 01/07/2019 0000   CREATININE 1.0 01/07/2019 0000   CREATININE 1.12 12/29/2018 0434   CALCIUM 8.6 (L) 12/29/2018 0434   PROT 7.6 12/25/2018 2106   ALBUMIN 3.1 (L) 12/25/2018 2106   AST 29 12/25/2018 2106   ALT 17 12/25/2018  2106   ALKPHOS 99 12/25/2018 2106   BILITOT 0.2 (L) 12/25/2018 2106   GFRNONAA 57 (L) 12/29/2018 0434   GFRAA >60 12/29/2018 0434   No results found for: CHOL, HDL, LDLCALC, LDLDIRECT, TRIG, CHOLHDL Lab Results  Component Value Date   HGBA1C  12/13/2008    5.5 (NOTE) The ADA recommends the following therapeutic goal for glycemic control related to Hgb A1c measurement: Goal of therapy: <6.5 Hgb A1c  Reference: American Diabetes Association: Clinical Practice Recommendations 2010, Diabetes Care, 2010, 33: (Suppl  1).   No results found for: VITAMINB12 No results found for: TSH     ASSESSMENT AND PLAN 86 y.o. year old male  has a past medical history of Atrial fibrillation, permanent ([place]), CAD (coronary artery disease), Dementia ([place]), Diverticulosis, High cholesterol, HTN (hypertension), and Lower GI bleed. here with     ICD-10-CM   1. Late onset Alzheimer's disease without behavioral disturbance (HCC)  G30.1    F02.80     Overall [name]. Markos is doing well from a dementia standpoint.  He is tolerating Aricept 5 mg twice daily.  He is not interested in adding any additional medications at this time.  He is eating much better.  He has gained 6 pounds.  He continues to perform ADLs with minimal assistance.  His wife does assist him with medications.  She feels able to continue caring for her husband.  They do have the support of their children, 1 of which is local.  They will continue close follow-up with primary care as directed.  He will follow-up with me in 6 months, sooner if needed.  He and his wife both verbalized understanding and agreement with this plan.   No orders of the defined types were placed in this encounter.    No orders of the defined types were placed in this encounter.     I spent 15 minutes with the patient. 50% of this time was spent counseling and educating patient on plan of care and medications.    Debbora Presto, FNP-C 07/06/2019, 2:39 PM [facility] [address] [place], [place] [address] [phone]

## 2019-07-07 ENCOUNTER — Other Ambulatory Visit: Payer: Self-pay

## 2019-07-07 ENCOUNTER — Ambulatory Visit (INDEPENDENT_AMBULATORY_CARE_PROVIDER_SITE_OTHER): Payer: Medicare Other | Admitting: Orthopaedic Surgery

## 2019-07-07 ENCOUNTER — Encounter: Payer: Self-pay | Admitting: Orthopaedic Surgery

## 2019-07-07 ENCOUNTER — Ambulatory Visit (INDEPENDENT_AMBULATORY_CARE_PROVIDER_SITE_OTHER): Payer: Medicare Other

## 2019-07-07 VITALS — Ht 71.0 in | Wt 151.0 lb

## 2019-07-07 DIAGNOSIS — S72142D Displaced intertrochanteric fracture of left femur, subsequent encounter for closed fracture with routine healing: Secondary | ICD-10-CM

## 2019-07-07 NOTE — Progress Notes (Signed)
Post-Op Visit Note   Patient: [name] [name]           Date of Birth: February 03, 1926           MRN: [id number] Visit Date: 07/07/2019 PCP: [name], MD   Assessment & Plan:  Chief Complaint:  Chief Complaint  Patient presents with  . Left Leg - Follow-up    Left femur IM nailing  DOS 12/26/2018   Visit Diagnoses:  1. Closed displaced intertrochanteric fracture of left femur with routine healing, subsequent encounter     Plan: Patient is a pleasant 86 year old gentleman who presents our clinic today 6 months status post left hip IM nail from an intertrochanteric femur fracture, date of surgery 12/26/2018.  He has been doing well.  He finished physical therapy 1 month ago.  He does walk with a cane.  His wife notes that he still occasionally walks with a limp.  He does not notice this.  No pain.  Examination of his left hip reveals a negative logroll.  Near full strength.  He is neurovascular intact distally.  At this point, his fracture has fully healed.  He will advance with activity as tolerated.  Call with concerns or questions in the meantime.  Follow-Up Instructions: Return if symptoms worsen or fail to improve.   Orders:  Orders Placed This Encounter  Procedures  . XR FEMUR MIN 2 VIEWS LEFT   No orders of the defined types were placed in this encounter.   Imaging: XR FEMUR MIN 2 VIEWS LEFT  Result Date: 07/07/2019 X-rays reveal a healed intertrochanteric femur fracture with bony consolidation   PMFS History: Patient Active Problem List   Diagnosis Date Noted  . Urinary urgency 01/01/2019  . Candidiasis 01/01/2019  . Closed displaced intertrochanteric fracture of left femur ([place]) 12/25/2018  . Closed intertrochanteric fracture of hip, left, initial encounter ([place]) 12/25/2018  . Pain due to onychomycosis of toenails of both feet 12/12/2018  . Acute blood loss anemia 06/08/2018  . GI bleed 06/05/2018  . HTN (hypertension)   . High cholesterol   . Diverticulosis     . CAD (coronary artery disease)   . Lower GI bleed   . AAA (abdominal aortic aneurysm) without rupture ([place]) 01/09/2018  . Ascending aortic aneurysm ([place]) 01/09/2018  . Heart block AV second degree 07/15/2017  . Status post three vessel coronary artery bypass 07/15/2017  . Left ventricular systolic dysfunction without heart failure 05/11/2017  . Hypercholesterolemia 05/11/2017  . CHB (complete heart block) ([place]) 06/07/2015  . Alzheimer's disease ([place]) 01/26/2015  . Pacemaker 01/26/2013  . Permanent atrial fibrillation ([place]) 01/26/2013  . Second degree AV block, Mobitz type II 01/26/2013  . Coronary artery disease involving native coronary artery of native heart without angina pectoris 01/26/2013  . Dyspnea 01/26/2013   Past Medical History:  Diagnosis Date  . Atrial fibrillation, permanent ([place])   . CAD (coronary artery disease)   . Dementia ([place])   . Diverticulosis    diverticular bleed 2011  . High cholesterol   . HTN (hypertension)    has improved over time, no rx as of 2019  . Lower GI bleed     Family History  Problem Relation Age of Onset  . Stroke Father   . Cervical cancer Other        siblings  . COPD Sister   . Dementia Brother     Past Surgical History:  Procedure Laterality Date  . angiolplasty    . APPENDECTOMY    .  INSERT / REPLACE / REMOVE PACEMAKER    . INTRAMEDULLARY (IM) NAIL INTERTROCHANTERIC Left 12/26/2018   Procedure: INTRAMEDULLARY (IM) NAIL INTERTROCHANTRIC;  Surgeon: Leandrew Koyanagi, MD;  Location: [facility];  Service: Orthopedics;  Laterality: Left;  . KNEE SURGERY    . PILONIDAL CYST EXCISION    . rotator cuff surgery  1985  . TONSILLECTOMY    . triple heart bypass     Social History   Occupational History  . Occupation: retired  Tobacco Use  . Smoking status: Former Smoker    Quit date: 07/02/1974    Years since quitting: 45.0  . Smokeless tobacco: Never Used  Substance and Sexual Activity  . Alcohol use: Not Currently    Comment: last  used in dec 2019  . Drug use: No  . Sexual activity: Not on file

## 2019-07-20 DIAGNOSIS — H04123 Dry eye syndrome of bilateral lacrimal glands: Secondary | ICD-10-CM | POA: Diagnosis not present

## 2019-07-20 DIAGNOSIS — H52203 Unspecified astigmatism, bilateral: Secondary | ICD-10-CM | POA: Diagnosis not present

## 2019-07-20 DIAGNOSIS — Z961 Presence of intraocular lens: Secondary | ICD-10-CM | POA: Diagnosis not present

## 2019-07-24 DIAGNOSIS — D62 Acute posthemorrhagic anemia: Secondary | ICD-10-CM | POA: Diagnosis not present

## 2019-07-24 DIAGNOSIS — I4819 Other persistent atrial fibrillation: Secondary | ICD-10-CM | POA: Diagnosis not present

## 2019-07-24 DIAGNOSIS — E7800 Pure hypercholesterolemia, unspecified: Secondary | ICD-10-CM | POA: Diagnosis not present

## 2019-07-24 DIAGNOSIS — G301 Alzheimer's disease with late onset: Secondary | ICD-10-CM | POA: Diagnosis not present

## 2019-07-24 DIAGNOSIS — F329 Major depressive disorder, single episode, unspecified: Secondary | ICD-10-CM | POA: Diagnosis not present

## 2019-07-24 DIAGNOSIS — I10 Essential (primary) hypertension: Secondary | ICD-10-CM | POA: Diagnosis not present

## 2019-07-24 DIAGNOSIS — N400 Enlarged prostate without lower urinary tract symptoms: Secondary | ICD-10-CM | POA: Diagnosis not present

## 2019-07-29 ENCOUNTER — Ambulatory Visit: Payer: Medicare Other

## 2019-07-31 ENCOUNTER — Encounter: Payer: Self-pay | Admitting: Podiatry

## 2019-07-31 ENCOUNTER — Ambulatory Visit (INDEPENDENT_AMBULATORY_CARE_PROVIDER_SITE_OTHER): Payer: Medicare Other | Admitting: Podiatry

## 2019-07-31 ENCOUNTER — Other Ambulatory Visit: Payer: Self-pay

## 2019-07-31 DIAGNOSIS — M79675 Pain in left toe(s): Secondary | ICD-10-CM | POA: Diagnosis not present

## 2019-07-31 DIAGNOSIS — M79674 Pain in right toe(s): Secondary | ICD-10-CM | POA: Diagnosis not present

## 2019-07-31 DIAGNOSIS — B351 Tinea unguium: Secondary | ICD-10-CM | POA: Diagnosis not present

## 2019-07-31 NOTE — Progress Notes (Signed)
Complaint:  Visit Type: Patient presents  to my office for  preventative foot care services. Complaint: Patient states" my nails have grown long and thick and become painful to walk and wear shoes"  The patient presents for preventative foot care services.   Podiatric Exam: Vascular: dorsalis pedis and posterior tibial pulses are palpable bilateral. Capillary return is immediate. Temperature gradient is WNL. Skin turgor WNL  Sensorium: Normal Semmes Weinstein monofilament test. Normal tactile sensation bilaterally. Nail Exam: Pt has thick disfigured discolored nails with subungual debris noted bilateral entire nail hallux through fifth toenails Ulcer Exam: There is no evidence of ulcer or pre-ulcerative changes or infection. Orthopedic Exam: Muscle tone and strength are WNL. No limitations in general ROM. No crepitus or effusions noted. Foot type and digits show no abnormalities. Bony prominences are unremarkable. Skin: No Porokeratosis. No infection or ulcers  Diagnosis:  Onychomycosis, , Pain in right toe, pain in left toes  Treatment & Plan Procedures and Treatment: Consent by patient was obtained for treatment procedures.   Debridement of mycotic and hypertrophic toenails, 1 through 5 bilateral and clearing of subungual debris. No ulceration, no infection noted.  Return Visit-Office Procedure: Patient instructed to return to the office for a follow up visit 10 weeks  for continued evaluation and treatment.    Gardiner Barefoot DPM

## 2019-08-31 ENCOUNTER — Encounter: Payer: Self-pay | Admitting: Cardiovascular Disease

## 2019-08-31 ENCOUNTER — Other Ambulatory Visit: Payer: Self-pay

## 2019-08-31 ENCOUNTER — Ambulatory Visit (INDEPENDENT_AMBULATORY_CARE_PROVIDER_SITE_OTHER): Payer: Medicare Other | Admitting: Cardiovascular Disease

## 2019-08-31 VITALS — BP 126/78 | HR 60 | Temp 97.3°F | Ht 70.0 in | Wt 154.6 lb

## 2019-08-31 DIAGNOSIS — D508 Other iron deficiency anemias: Secondary | ICD-10-CM | POA: Diagnosis not present

## 2019-08-31 DIAGNOSIS — I10 Essential (primary) hypertension: Secondary | ICD-10-CM | POA: Diagnosis not present

## 2019-08-31 DIAGNOSIS — I4821 Permanent atrial fibrillation: Secondary | ICD-10-CM

## 2019-08-31 DIAGNOSIS — Z950 Presence of cardiac pacemaker: Secondary | ICD-10-CM | POA: Diagnosis not present

## 2019-08-31 DIAGNOSIS — I2510 Atherosclerotic heart disease of native coronary artery without angina pectoris: Secondary | ICD-10-CM

## 2019-08-31 DIAGNOSIS — I519 Heart disease, unspecified: Secondary | ICD-10-CM | POA: Diagnosis not present

## 2019-08-31 DIAGNOSIS — I714 Abdominal aortic aneurysm, without rupture, unspecified: Secondary | ICD-10-CM

## 2019-08-31 DIAGNOSIS — I712 Thoracic aortic aneurysm, without rupture: Secondary | ICD-10-CM | POA: Diagnosis not present

## 2019-08-31 DIAGNOSIS — G301 Alzheimer's disease with late onset: Secondary | ICD-10-CM | POA: Diagnosis not present

## 2019-08-31 DIAGNOSIS — I4819 Other persistent atrial fibrillation: Secondary | ICD-10-CM | POA: Diagnosis not present

## 2019-08-31 DIAGNOSIS — I7121 Aneurysm of the ascending aorta, without rupture: Secondary | ICD-10-CM

## 2019-08-31 DIAGNOSIS — N400 Enlarged prostate without lower urinary tract symptoms: Secondary | ICD-10-CM | POA: Diagnosis not present

## 2019-08-31 DIAGNOSIS — I441 Atrioventricular block, second degree: Secondary | ICD-10-CM | POA: Diagnosis not present

## 2019-08-31 DIAGNOSIS — E7800 Pure hypercholesterolemia, unspecified: Secondary | ICD-10-CM | POA: Diagnosis not present

## 2019-08-31 DIAGNOSIS — F329 Major depressive disorder, single episode, unspecified: Secondary | ICD-10-CM | POA: Diagnosis not present

## 2019-08-31 DIAGNOSIS — D62 Acute posthemorrhagic anemia: Secondary | ICD-10-CM | POA: Diagnosis not present

## 2019-08-31 NOTE — Progress Notes (Signed)
Cardiology Office Note    Date:  08/31/2019   ID:  [name] [name], DOB 11/25/1925, MRN [id number]  PCP:  [name] Manes, MD  Cardiologist:   [name] Klein, MD   Chief Complaint  Patient presents with  . Follow-up  Pacemaker check, CAD  History of Present Illness:  [name] [name] is a 86 y.o. male with complete heart block (pacemaker dependent), permanent atrial fibrillation, CAD s/p CABG, Alzheimer's dementia, small ascending thoracic aortic aneurysm (4 cm), small abdominal aortic aneurysm (2.8 cm), hypertension and hyperlipidemia here for follow-up on his pacemaker (2009, [employer] Zephyr XL DR, programmed VVIR).  He is not on anticoagulation due to severe gait instability and falls leading to fractures as well as 2 episodes of severe lower GI bleeding.  Other than progressive memory deterioration that he has done well in the last 6 months.  He has not had any new falls or bleeding problems. The patient specifically denies any chest pain at rest exertion, dyspnea at rest or with exertion, orthopnea, paroxysmal nocturnal dyspnea, syncope, palpitations, focal neurological deficits, intermittent claudication, lower extremity edema, unexplained weight gain, cough, hemoptysis or wheezing.  He has permanent atrial fibrillation and his [place] dual-chamber pacemaker is programmed VVIR.  The current generator was implanted in 2009 and still has 2.75-3.0 years of longevity.  The leads were implanted in 2001 and are still functioning at normal parameters.  Device interrogation shows 99 % ventricular pacing.  Underlying rhythm is atrial fibrillation with slow ventricular response with a rate of 35 bpm.  Today he is not pacemaker dependent.  However, on some of his previous checks he has not had any underlying escape rhythm.  Echo performed in July 2019 shows borderline LV systolic function with EF of 50-55%, aortic valve sclerosis without stenosis.  He had bypass surgery in 2010  [name] [name], LIMA to LAD, SVG to distal circumflex, SVG to distal RCA) and has not had any signs or symptoms of coronary disease since that time.  He had a surgical maze ablation and ligation of left atrial appendage at that time. His nuclear stress test in August 2014 showed a dense inferolateral scar. Left ventricular ejection fraction was around 50%.   Past Medical History:  Diagnosis Date  . Atrial fibrillation, permanent ([place])   . CAD (coronary artery disease)   . Dementia ([place])   . Diverticulosis    diverticular bleed 2011  . High cholesterol   . HTN (hypertension)    has improved over time, no rx as of 2019  . Lower GI bleed     Past Surgical History:  Procedure Laterality Date  . angiolplasty    . APPENDECTOMY    . INSERT / REPLACE / REMOVE PACEMAKER    . INTRAMEDULLARY (IM) NAIL INTERTROCHANTERIC Left 12/26/2018   Procedure: INTRAMEDULLARY (IM) NAIL INTERTROCHANTRIC;  Surgeon: [name] Koyanagi, MD;  Location: [facility];  Service: Orthopedics;  Laterality: Left;  . KNEE SURGERY    . PILONIDAL CYST EXCISION    . rotator cuff surgery  1985  . TONSILLECTOMY    . triple heart bypass      Current Medications: Outpatient Medications Prior to Visit  Medication Sig Dispense Refill  . aspirin EC 81 MG tablet Take 81 mg by mouth daily.    [name] atorvastatin (LIPITOR) 40 MG tablet Take 40 mg by mouth every evening.     . Cholecalciferol (VITAMIN D) 2000 UNITS CAPS Take 2,000 Units by mouth at bedtime.     [name]  donepezil (ARICEPT) 5 MG tablet Take 1 tablet (5 mg total) by mouth 2 (two) times daily. 180 tablet 3  . ferrous sulfate 325 (65 FE) MG EC tablet Take 325 mg by mouth 3 (three) times a week. OTC    . finasteride (PROSCAR) 5 MG tablet Take 5 mg by mouth every evening.     . Multiple Vitamin (MULTIVITAMIN WITH MINERALS) TABS tablet Take 1 tablet by mouth daily. Multivitamin shakes.    [name] sertraline (ZOLOFT) 25 MG tablet Take 25 mg by mouth daily.     No facility-administered medications prior  to visit.     Allergies:   Amiodarone and Antihistamines, diphenhydramine-type   Social History   Socioeconomic History  . Marital status: Married    Spouse name: [name] [name]  . Number of children: 6  . Years of education: college  . Highest education level: Not on file  Occupational History  . Occupation: retired  Tobacco Use  . Smoking status: Former Smoker    Quit date: 07/02/1974    Years since quitting: 45.1  . Smokeless tobacco: Never Used  Substance and Sexual Activity  . Alcohol use: Not Currently    Comment: last used in dec 2019  . Drug use: No  . Sexual activity: Not on file  Other Topics Concern  . Not on file  Social History Narrative   Patient is married [name] [name]) and lives at home with his wife.   Patient has a college education.   Patient is a retired Materials engineer.   Patient does not drink any caffeine.   Patient has six children.   Social Determinants of Health   Financial Resource Strain:   . Difficulty of Paying Living Expenses: Not on file  Food Insecurity:   . Worried About Charity fundraiser in the Last Year: Not on file  . Ran Out of Food in the Last Year: Not on file  Transportation Needs:   . Lack of Transportation (Medical): Not on file  . Lack of Transportation (Non-Medical): Not on file  Physical Activity:   . Days of Exercise per Week: Not on file  . Minutes of Exercise per Session: Not on file  Stress:   . Feeling of Stress : Not on file  Social Connections:   . Frequency of Communication with Friends and Family: Not on file  . Frequency of Social Gatherings with Friends and Family: Not on file  . Attends Religious Services: Not on file  . Active Member of Clubs or Organizations: Not on file  . Attends Archivist Meetings: Not on file  . Marital Status: Not on file     Family History:  The patient's family history includes COPD in his sister; Cervical cancer in an other family member; Dementia in his  brother; Stroke in his father.   ROS:   Please see the history of present illness.    ROS all other systems are reviewed and are negative.  Some limitations due to memory problems.   PHYSICAL EXAM:   VS:  BP 126/78   Pulse 60   Temp (!) 97.3 F (36.3 C)   Ht 5\' 10"  (1.778 m)   Wt 154 lb 9.6 oz (70.1 kg)   SpO2 94%   BMI 22.18 kg/m      General: Alert, oriented x3, no distress, lean, thoracic kyphosis, healthy right subclavian pacemaker site Head: no evidence of trauma, PERRL, EOMI, no exophtalmos or lid lag, no myxedema, no xanthelasma; normal  ears, nose and oropharynx Neck: normal jugular venous pulsations and no hepatojugular reflux; brisk carotid pulses without delay and no carotid bruits Chest: clear to auscultation, no signs of consolidation by percussion or palpation, normal fremitus, symmetrical and full respiratory excursions Cardiovascular: normal position and quality of the apical impulse, regular rhythm, normal first and  paradoxically split second heart sounds, musical 2/6 holosystolic murmur heard best at the left lower sternal border in the apex, but radiating broadly, no diastolic murmurs, rubs or gallops Abdomen: no tenderness or distention, no masses by palpation, no abnormal pulsatility or arterial bruits, normal bowel sounds, no hepatosplenomegaly Extremities: no clubbing, cyanosis or edema; 2+ radial, ulnar and brachial pulses bilaterally; 2+ right femoral, posterior tibial and dorsalis pedis pulses; 2+ left femoral, posterior tibial and dorsalis pedis pulses; no subclavian or femoral bruits Neurological: grossly nonfocal Psych: Normal mood and affect  Wt Readings from Last 3 Encounters:  08/31/19 154 lb 9.6 oz (70.1 kg)  07/07/19 151 lb (68.5 kg)  07/06/19 151 lb 6.4 oz (68.7 kg)      Studies/Labs Reviewed:   EKG:  EKG is ordered today shows background atrial fibrillation with 100% ventricular pacing.  Note qR pattern in the V1 lead.  BMET    Component  Value Date/Time   NA 136 (A) 01/07/2019 0000   K 4.7 01/07/2019 0000   CL 104 12/29/2018 0434   CO2 23 12/29/2018 0434   GLUCOSE 97 12/29/2018 0434   BUN 23 (A) 01/07/2019 0000   CREATININE 1.0 01/07/2019 0000   CREATININE 1.12 12/29/2018 0434   CALCIUM 8.6 (L) 12/29/2018 0434   GFRNONAA 57 (L) 12/29/2018 0434   GFRAA >60 12/29/2018 0434   Lipid profile 04/08/2019 total cholesterol 141, triglycerides 92, HDL 45, LDL 78  ASSESSMENT:    1. Second degree AV block, Mobitz type II   2. Permanent atrial fibrillation ([place])   3. Left ventricular systolic dysfunction without heart failure   4. Coronary artery disease involving native coronary artery of native heart without angina pectoris   5. Pacemaker   6. Ascending aortic aneurysm ([place])   7. AAA (abdominal aortic aneurysm) without rupture ([facility])      PLAN:  In order of problems listed above:  1. 2nd/3rd deg AV block: He has atrial fibrillation with very slow ventricular response, at times no detectable escape rhythm, but today has AV conduction at about 35 bpm.  For safety should be considered pacemaker dependent. 2. AFib: Permanent arrhythmia with spontaneously slow ventricular rate.  Anticoagulation stopped due to gait instability and at least 2 serious episodes of lower GI bleeding.  CHADSVasc 4 (age 7, CAD, LV dysf). He does have a history of appendage ligation, so embolic risk is lower. 3. CAD s/p CABG: Asymptomatic, angina free.  He is on aspirin and a statin.  Target LDL 70 or less (he is very close to that parameter), HDL is actually better than it was a year ago 4. Borderline LV dysfunction: EF around 50%, but he has never had overt heart failure and does not require diuretics.  We are not using beta-blockers or R AAS inhibitors to avoid polypharmacy in this elderly gentleman with cognitive decline and a tendency to fall. 5. PM: Device is functioning normally.  His older model cannot be checked remotely and he will come back to  the office every 6 months. 6. Ascending aortic aneurysm and abdominal aortic aneurysm: No symptoms of chest or abdominal discomfort.  These have been stable in size and minor  abnormalities (4 cm and 2.8 cm respectively).  In view of his advanced age (and since he would not to be a good surgical candidate) we have decided not to monitor these on a routine basis.   Medication Adjustments/Labs and Tests Ordered: Current medicines are reviewed at length with the patient today.  Concerns regarding medicines are outlined above.  Medication changes, Labs and Tests ordered today are listed in the Patient Instructions below. Patient Instructions  Medication Instructions:  No changes If you need a refill on your cardiac medications before your next appointment, please call your pharmacy.   Lab work: None ordered If you have labs (blood work) drawn today and your tests are completely normal, you will receive your results only by: [place] (if you have MyChart) OR A paper copy in the mail If you have any lab test that is abnormal or we need to change your treatment, we will call you to review the results.  Testing/Procedures: None ordered  Follow-Up: At [facility], you and your health needs are our priority.  As part of our continuing mission to provide you with exceptional heart care, we have created designated Provider Care Teams.  These Care Teams include your primary Cardiologist (physician) and Advanced Practice Providers (APPs -  Physician Assistants and Nurse Practitioners) who all work together to provide you with the care you need, when you need it.  We recommend signing up for the patient portal called "MyChart".  Sign up information is provided on this After Visit Summary.  MyChart is used to connect with patients for Virtual Visits (Telemedicine).  Patients are able to view lab/test results, encounter notes, upcoming appointments, etc.  Non-urgent messages can be sent to your  provider as well.   To learn more about what you can do with MyChart, go to [email or web address].    Your next appointment:   6 month(s)  The format for your next appointment:   In Person  Provider:   [name], MD      Signed, [name] Klein, MD  08/31/2019 11:05 AM    [address], [address] [place], [place]  [address] Phone: [phone]; Fax: [phone]

## 2019-08-31 NOTE — Patient Instructions (Signed)
Medication Instructions:  No changes If you need a refill on your cardiac medications before your next appointment, please call your pharmacy.   Lab work: None ordered If you have labs (blood work) drawn today and your tests are completely normal, you will receive your results only by: [place] (if you have MyChart) OR A paper copy in the mail If you have any lab test that is abnormal or we need to change your treatment, we will call you to review the results.  Testing/Procedures: None ordered  Follow-Up: At [facility], you and your health needs are our priority.  As part of our continuing mission to provide you with exceptional heart care, we have created designated Provider Care Teams.  These Care Teams include your primary Cardiologist (physician) and Advanced Practice Providers (APPs -  Physician Assistants and Nurse Practitioners) who all work together to provide you with the care you need, when you need it.  We recommend signing up for the patient portal called "MyChart".  Sign up information is provided on this After Visit Summary.  MyChart is used to connect with patients for Virtual Visits (Telemedicine).  Patients are able to view lab/test results, encounter notes, upcoming appointments, etc.  Non-urgent messages can be sent to your provider as well.   To learn more about what you can do with MyChart, go to [email or web address].    Your next appointment:   6 month(s)  The format for your next appointment:   In Person  Provider:   [name], MD

## 2019-09-09 DIAGNOSIS — L905 Scar conditions and fibrosis of skin: Secondary | ICD-10-CM | POA: Diagnosis not present

## 2019-09-09 DIAGNOSIS — L819 Disorder of pigmentation, unspecified: Secondary | ICD-10-CM | POA: Diagnosis not present

## 2019-09-09 DIAGNOSIS — L814 Other melanin hyperpigmentation: Secondary | ICD-10-CM | POA: Diagnosis not present

## 2019-09-09 DIAGNOSIS — L821 Other seborrheic keratosis: Secondary | ICD-10-CM | POA: Diagnosis not present

## 2019-09-09 DIAGNOSIS — D485 Neoplasm of uncertain behavior of skin: Secondary | ICD-10-CM | POA: Diagnosis not present

## 2019-09-09 DIAGNOSIS — D229 Melanocytic nevi, unspecified: Secondary | ICD-10-CM | POA: Diagnosis not present

## 2019-09-09 DIAGNOSIS — L989 Disorder of the skin and subcutaneous tissue, unspecified: Secondary | ICD-10-CM | POA: Diagnosis not present

## 2019-09-09 DIAGNOSIS — L570 Actinic keratosis: Secondary | ICD-10-CM | POA: Diagnosis not present

## 2019-09-09 DIAGNOSIS — D1801 Hemangioma of skin and subcutaneous tissue: Secondary | ICD-10-CM | POA: Diagnosis not present

## 2019-09-09 DIAGNOSIS — Z85828 Personal history of other malignant neoplasm of skin: Secondary | ICD-10-CM | POA: Diagnosis not present

## 2019-10-07 DIAGNOSIS — I4819 Other persistent atrial fibrillation: Secondary | ICD-10-CM | POA: Diagnosis not present

## 2019-10-07 DIAGNOSIS — G301 Alzheimer's disease with late onset: Secondary | ICD-10-CM | POA: Diagnosis not present

## 2019-10-07 DIAGNOSIS — E7800 Pure hypercholesterolemia, unspecified: Secondary | ICD-10-CM | POA: Diagnosis not present

## 2019-10-07 DIAGNOSIS — I700 Atherosclerosis of aorta: Secondary | ICD-10-CM | POA: Diagnosis not present

## 2019-10-07 DIAGNOSIS — Z79899 Other long term (current) drug therapy: Secondary | ICD-10-CM | POA: Diagnosis not present

## 2019-10-07 DIAGNOSIS — I10 Essential (primary) hypertension: Secondary | ICD-10-CM | POA: Diagnosis not present

## 2019-10-07 DIAGNOSIS — D508 Other iron deficiency anemias: Secondary | ICD-10-CM | POA: Diagnosis not present

## 2019-10-09 ENCOUNTER — Ambulatory Visit (INDEPENDENT_AMBULATORY_CARE_PROVIDER_SITE_OTHER): Payer: Medicare Other | Admitting: Podiatry

## 2019-10-09 ENCOUNTER — Other Ambulatory Visit: Payer: Self-pay

## 2019-10-09 ENCOUNTER — Encounter: Payer: Self-pay | Admitting: Podiatry

## 2019-10-09 VITALS — Temp 97.4°F

## 2019-10-09 DIAGNOSIS — B351 Tinea unguium: Secondary | ICD-10-CM | POA: Diagnosis not present

## 2019-10-09 DIAGNOSIS — M79675 Pain in left toe(s): Secondary | ICD-10-CM | POA: Diagnosis not present

## 2019-10-09 DIAGNOSIS — M79674 Pain in right toe(s): Secondary | ICD-10-CM

## 2019-10-09 NOTE — Progress Notes (Signed)
This patient returns to the office for evaluation and treatment of long thick painful nails .  This patient is unable to trim his own nails since the patient cannot reach the feet.  Patient says the nails are painful walking and wearing his shoes.  He returns for preventive foot care services. He presents to the office with male caregiver.  General Appearance  Alert, conversant and in no acute stress.  Vascular  Dorsalis pedis and posterior tibial  pulses are weakly  palpable  bilaterally.  Capillary return is within normal limits  bilaterally. Temperature is within normal limits  bilaterally.  Neurologic  Senn-Weinstein monofilament wire test within normal limits  bilaterally. Muscle power within normal limits bilaterally.  Nails Thick disfigured discolored nails with subungual debris  from hallux to fifth toes bilaterally. No evidence of bacterial infection or drainage bilaterally.  Orthopedic  No limitations of motion  feet .  No crepitus or effusions noted.  No bony pathology or digital deformities noted.  Skin  normotropic skin with no porokeratosis noted bilaterally.  No signs of infections or ulcers noted.     Onychomycosis  Pain in toes right foot  Pain in toes left foot  Debridement  of nails  1-5  B/L with a nail nipper.  Nails were then filed using a dremel tool with no incidents.    RTC  10 weeks   Gardiner Barefoot DPM

## 2019-10-20 DIAGNOSIS — I10 Essential (primary) hypertension: Secondary | ICD-10-CM | POA: Diagnosis not present

## 2019-10-20 DIAGNOSIS — I4819 Other persistent atrial fibrillation: Secondary | ICD-10-CM | POA: Diagnosis not present

## 2019-10-20 DIAGNOSIS — N1832 Chronic kidney disease, stage 3b: Secondary | ICD-10-CM | POA: Diagnosis not present

## 2019-10-20 DIAGNOSIS — G301 Alzheimer's disease with late onset: Secondary | ICD-10-CM | POA: Diagnosis not present

## 2019-10-20 DIAGNOSIS — E7800 Pure hypercholesterolemia, unspecified: Secondary | ICD-10-CM | POA: Diagnosis not present

## 2019-11-04 DIAGNOSIS — Z79899 Other long term (current) drug therapy: Secondary | ICD-10-CM | POA: Diagnosis not present

## 2019-11-04 DIAGNOSIS — L905 Scar conditions and fibrosis of skin: Secondary | ICD-10-CM | POA: Diagnosis not present

## 2019-11-04 DIAGNOSIS — Z85828 Personal history of other malignant neoplasm of skin: Secondary | ICD-10-CM | POA: Diagnosis not present

## 2019-11-04 DIAGNOSIS — L0100 Impetigo, unspecified: Secondary | ICD-10-CM | POA: Diagnosis not present

## 2019-12-18 ENCOUNTER — Other Ambulatory Visit: Payer: Self-pay

## 2019-12-18 ENCOUNTER — Encounter: Payer: Self-pay | Admitting: Podiatry

## 2019-12-18 ENCOUNTER — Ambulatory Visit (INDEPENDENT_AMBULATORY_CARE_PROVIDER_SITE_OTHER): Payer: Medicare Other | Admitting: Podiatry

## 2019-12-18 DIAGNOSIS — B351 Tinea unguium: Secondary | ICD-10-CM

## 2019-12-18 DIAGNOSIS — M79675 Pain in left toe(s): Secondary | ICD-10-CM | POA: Diagnosis not present

## 2019-12-18 DIAGNOSIS — M79674 Pain in right toe(s): Secondary | ICD-10-CM | POA: Diagnosis not present

## 2019-12-18 NOTE — Progress Notes (Signed)
This patient returns to the office for evaluation and treatment of long thick painful nails .  This patient is unable to trim his own nails since the patient cannot reach the feet.  Patient says the nails are painful walking and wearing his shoes.  He returns for preventive foot care services. He presents to the office with male caregiver.  General Appearance  Alert, conversant and in no acute stress.  Vascular  Dorsalis pedis and posterior tibial  pulses are weakly  palpable  bilaterally.  Capillary return is within normal limits  bilaterally. Temperature is within normal limits  bilaterally.  Neurologic  Senn-Weinstein monofilament wire test within normal limits  bilaterally. Muscle power within normal limits bilaterally.  Nails Thick disfigured discolored nails with subungual debris  from hallux to fifth toes bilaterally. No evidence of bacterial infection or drainage bilaterally.  Orthopedic  No limitations of motion  feet .  No crepitus or effusions noted.  No bony pathology or digital deformities noted.  Skin  normotropic skin with no porokeratosis noted bilaterally.  No signs of infections or ulcers noted.     Onychomycosis  Pain in toes right foot  Pain in toes left foot  Debridement  of nails  1-5  B/L with a nail nipper.  Nails were then filed using a dremel tool with no incidents.    RTC  10 weeks   Gardiner Barefoot DPM

## 2020-01-05 ENCOUNTER — Telehealth: Payer: Self-pay

## 2020-01-05 NOTE — Telephone Encounter (Signed)
Voicemail message, 9:28am:  Pt's wife called to reschedule his upcoming appointment with Amy, please call her at [phone].

## 2020-01-07 ENCOUNTER — Ambulatory Visit: Payer: Medicare Other | Admitting: Family Medicine

## 2020-01-19 ENCOUNTER — Encounter: Payer: Self-pay | Admitting: Family Medicine

## 2020-01-19 ENCOUNTER — Ambulatory Visit (INDEPENDENT_AMBULATORY_CARE_PROVIDER_SITE_OTHER): Payer: Medicare Other | Admitting: Family Medicine

## 2020-01-19 VITALS — BP 132/71 | HR 68 | Ht 72.0 in | Wt 152.0 lb

## 2020-01-19 DIAGNOSIS — F0280 Dementia in other diseases classified elsewhere without behavioral disturbance: Secondary | ICD-10-CM | POA: Diagnosis not present

## 2020-01-19 DIAGNOSIS — G301 Alzheimer's disease with late onset: Secondary | ICD-10-CM | POA: Diagnosis not present

## 2020-01-19 NOTE — Patient Instructions (Signed)
We will continue Aricept 5mg  twice daily. Continue to stay active. Eat a well balanced diet. Stay well hydrated.   Follow up in 6 months   Memory Compensation Strategies  1. Use "WARM" strategy.  W= write it down  A= associate it  R= repeat it  M= make a mental note  2.   You can keep a Social worker.  Use a 3-ring notebook with sections for the following: calendar, important names and phone numbers,  medications, doctors' names/phone numbers, lists/reminders, and a section to journal what you did  each day.   3.    Use a calendar to write appointments down.  4.    Write yourself a schedule for the day.  This can be placed on the calendar or in a separate section of the Memory Notebook.  Keeping a  regular schedule can help memory.  5.    Use medication organizer with sections for each day or morning/evening pills.  You may need help loading it  6.    Keep a basket, or pegboard by the door.  Place items that you need to take out with you in the basket or on the pegboard.  You may also want to  include a message board for reminders.  7.    Use sticky notes.  Place sticky notes with reminders in a place where the task is performed.  For example: " turn off the  stove" placed by the stove, "lock the door" placed on the door at eye level, " take your medications" on  the bathroom mirror or by the place where you normally take your medications.  8.    Use alarms/timers.  Use while cooking to remind yourself to check on food or as a reminder to take your medicine, or as a  reminder to make a call, or as a reminder to perform another task, etc.   Memantine Tablets What is this medicine? MEMANTINE (MEM an teen) is used to treat dementia caused by Alzheimer's disease. This medicine may be used for other purposes; ask your health care provider or pharmacist if you have questions. COMMON BRAND NAME(S): Namenda What should I tell my health care provider before I take this  medicine? They need to know if you have any of these conditions:  difficulty passing urine  kidney disease  liver disease  seizures  an unusual or allergic reaction to memantine, other medicines, foods, dyes, or preservatives  pregnant or trying to get pregnant  breast-feeding How should I use this medicine? Take this medicine by mouth with a glass of water. Follow the directions on the prescription label. You may take this medicine with or without food. Take your doses at regular intervals. Do not take your medicine more often than directed. Continue to take your medicine even if you feel better. Do not stop taking except on the advice of your doctor or health care professional. Talk to your pediatrician regarding the use of this medicine in children. Special care may be needed. Overdosage: If you think you have taken too much of this medicine contact a poison control center or emergency room at once. NOTE: This medicine is only for you. Do not share this medicine with others. What if I miss a dose? If you miss a dose, take it as soon as you can. If it is almost time for your next dose, take only that dose. Do not take double or extra doses. If you do not take your medicine for  several days, contact your health care provider. Your dose may need to be changed. What may interact with this medicine?  acetazolamide  amantadine  cimetidine  dextromethorphan  dofetilide  hydrochlorothiazide  ketamine  metformin  methazolamide  quinidine  ranitidine  sodium bicarbonate  triamterene This list may not describe all possible interactions. Give your health care provider a list of all the medicines, herbs, non-prescription drugs, or dietary supplements you use. Also tell them if you smoke, drink alcohol, or use illegal drugs. Some items may interact with your medicine. What should I watch for while using this medicine? Visit your doctor or health care professional for regular  checks on your progress. Check with your doctor or health care professional if there is no improvement in your symptoms or if they get worse. You may get drowsy or dizzy. Do not drive, use machinery, or do anything that needs mental alertness until you know how this drug affects you. Do not stand or sit up quickly, especially if you are an older patient. This reduces the risk of dizzy or fainting spells. Alcohol can make you more drowsy and dizzy. Avoid alcoholic drinks. What side effects may I notice from receiving this medicine? Side effects that you should report to your doctor or health care professional as soon as possible:  allergic reactions like skin rash, itching or hives, swelling of the face, lips, or tongue  agitation or a feeling of restlessness  depressed mood  dizziness  hallucinations  redness, blistering, peeling or loosening of the skin, including inside the mouth  seizures  vomiting Side effects that usually do not require medical attention (report to your doctor or health care professional if they continue or are bothersome):  constipation  diarrhea  headache  nausea  trouble sleeping This list may not describe all possible side effects. Call your doctor for medical advice about side effects. You may report side effects to FDA at 1-800-FDA-1088. Where should I keep my medicine? Keep out of the reach of children. Store at room temperature between 15 degrees and 30 degrees C (59 degrees and 86 degrees F). Throw away any unused medicine after the expiration date. NOTE: This sheet is a summary. It may not cover all possible information. If you have questions about this medicine, talk to your doctor, pharmacist, or health care provider.  2020 Elsevier/Gold Standard (2013-04-06 14:10:42)   Dementia Dementia is a condition that affects the way the brain works. It often affects memory and thinking. There are many types of dementia. Some types get worse with time  and cannot be reversed. Some types of dementia include:  Alzheimer's disease. This is the most common type.  Vascular dementia. This type may happen due to a stroke.  Lewy body dementia. This type may happen to people who have Parkinson's disease.  Frontotemporal dementia. This type is caused by damage to nerve cells in certain parts of the brain. Some people may have more than one type, and this is called mixed dementia. What are the causes? This condition is caused by damage to cells in the brain. Some causes that cannot be reversed include:  Having a condition that affects the blood vessels of the brain, such as diabetes, heart disease, or blood vessel disease.  Changes to genes. Some causes that can be reversed or slowed include:  Injury to the brain.  Certain medicines.  Infection.  Not having enough vitamin B12 in the body, or thyroid problems.  A tumor or blood clot in the  brain. What are the signs or symptoms? Symptoms depend on the type of dementia. This may include:  Problems remembering things.  Having trouble taking a bath or putting clothes on.  Forgetting appointments.  Forgetting to pay bills.  Trouble planning and making meals.  Having trouble speaking.  Getting lost easily. How is this treated? Treatment depends on the cause of the dementia. It might include taking medicines that help:  To control the dementia.  To slow down the dementia.  To manage symptoms. In some cases, treating the cause of your dementia can improve symptoms, reverse symptoms, or slow down how quickly it gets worse. Your doctor can help you find support groups and other doctors who can help with your care. Follow these instructions at home: Medicines  Take over-the-counter and prescription medicines only as told by your doctor.  Use a pill organizer to help you manage your medicines.  Avoidtaking medicines for pain or for sleep. Lifestyle  Make healthy choices: ? Be  active as told by your doctor. ? Do not use any products that contain nicotine or tobacco, such as cigarettes, e-cigarettes, and chewing tobacco. If you need help quitting, ask your doctor. ? Do not drink alcohol. ? When you get stressed, do something that will help you to relax. Your doctor can give you tips. ? Spend time with other people.  Make sure you get good sleep. To get good sleep: ? Try not to take naps during the day. ? Keep your bedroom dark and cool. ? In the few hours before you go to bed, try not to do any exercise. ? Do not have foods and drinks with caffeine at night. Eating and drinking  Drink enough fluid to keep your pee (urine) pale yellow.  Eat a healthy diet. General instructions   Talk with your doctor to figure out: ? What you need help with. ? What your safety needs are.  Ask your doctor if it is safe for you to drive.  If told, wear a bracelet that tracks where you are or shows that you are a person with memory loss.  Work with your family to make big decisions.  Keep all follow-up visits as told by your doctor. This is important. Contact a doctor if:  You have any new symptoms.  Your symptoms get worse.  You have problems with swallowing or choking. Get help right away if:  You feel very sad, or feel that you want to harm yourself.  You or your family members are worried for your safety. If you ever feel like you may hurt yourself or others, or have thoughts about taking your own life, get help right away. You can go to your nearest emergency department or call:  Your local emergency services (911 in the [place]  A suicide crisis helpline, such as the [facility] at 916-703-6536. This is open 24 hours a day. Summary  Dementia often affects memory and thinking.  Some types of dementia get worse with time and cannot be reversed.  Treatment for this condition depends on the cause.  Talk with your doctor to  figure out what you need help with.  Your doctor can help you find support groups and other doctors who can help with your care. This information is not intended to replace advice given to you by your health care provider. Make sure you discuss any questions you have with your health care provider. Document Revised: 09/02/2018 Document Reviewed: 09/02/2018 Elsevier Patient Education  [place].

## 2020-01-19 NOTE — Progress Notes (Signed)
PATIENT: [name] [name] DOB: [birth year]  REASON FOR VISIT: follow up HISTORY FROM: patient  Chief Complaint  Patient presents with  . Follow-up    rm 1 here for a f/u on memory     HISTORY OF PRESENT ILLNESS: Today 01/19/20 [name] [name] is a 86 y.o. male here today for follow up for dementia. He continues to do well. He is taking Aricept 5mg  twice daily. He is able to perform ADL's mostly independently. His wife doses medications. He continues to be as active as possible. Balance is stable. He did have one fall since being seen. His wife reports placing some cushions by his bed and he tripped over them. No injuries. He does use 4 prong cane for stability.   HISTORY: (copied from my note on 07/06/2019)  [name] [name] is a 86 y.o. male here today for follow up for dementia. Weight is stable, in fact, he has gained about 6 pounds since last visit with [place]. He is doing well with gait. No falls. Walks with four prong cane. He does like to walk inside home but no dedicated physical exercise. He is able to perform ADL's with minimal assistance. Wife assists with medications.  They do have assistance and support from his son.  No behavioral concerns.  He is followed by primary care regularly.  They have upcoming appointment in January.  HISTORY: (copied from my note on 03/25/2019)  [name] [name] a 86 y.o.malehere today for follow up for dementia. He continues Aricept 5mg  twice daily. He is tolerating medication well with no obvious adverse effects. Never taken Namenda. Family not certain they wish to add medications at this time. They feel that memory is stable. He does not drive. He sleeps in a bedroom on main floor. He has to take a shower upstairs. He has a shower chair. He does have assistance getting in/out but can shower independently. He is able to dress and feed himself. He has gained some weight. Appetite is better.   GI bleed in 06/2018. Anticoagulation was stopped. Cardiology  follow up in 01/2019 stable with follow up planned in October. Asa 81mg  restarted. No bleeding or excessive briusing. He had a fall on 12/25/2018. He reports walking on sidewalk and lost balance. No LOC. He had IM nailing on 6/26. He was last seen by ortho on 9/18 and notes show stable fixation and neurovascularly intact. He is scheduled to return in 6 weeks. He continues to work with PT/OT. He was using a walker but recently switched to a 4 point cane. No falls.   HISTORY: (copied fromDr Dohmeier'snote on 12/16/2018)  Chronic atrial fibrillation,status post Gi bleed, heart block, and dementia. The patient appears frail and sleepy- he was able to report stories from his youth in [place].  He has neither fainted not fallen in the last 4 month   HISTORY FROM: The patient and his wife are on the video.  RV 07-16-2018:  I have the pleasure of seeing [name] [name] today here in the presence of his spouse, he is a 86 year old gentleman and his next Thursday will be on 21 February. [name] [name] was hospitalized after a fall in the night of 06-05-2018,he had bled- had not passed out. Wife and son couldn't raise him up, the EMS was called. He had a GI-bleed related to diverticulitis and was on coumadin- lost 11 pounds, anemic, pale. He was in the Ed again after 2 days admission for another admission of 5 days duration.  Was very sick. He has forgotten all of it. GI MD is Dr Cristina Gong.  [name] [name] is followed here on regular intervals for cognitive testing and his Mini-Mental status examination in 2018 had revealed 20 out of 30 points, today he scored 21 out of 30 points. He is not depressed or at least does not endorse any of these depression related symptoms on a questionnaire he is not excessively fatigued but he is certainly getting frailer. He has a very different posture now and is more stooped, his neck circumference is 16-1/4 inches and is rather slim for a gentleman of his height 5 foot 10. His  body mass index is 20.4.   HISTORY OF PRESENT ILLNESS: Today 07/09/2016 and is Mini-Mental Status Examination revealed 20 out of 30 points today the last evaluation was 25 out of 30. The patient has been stable since summer 2016 with points between 25 and 24 out of 30. [name] [name], 86 year old male returns for follow-up. He has history of Alzheimer's dementia. He continues to be able to complete his ADLs independently, he does not have a driver's license. He is currently on Aricept 5 mg twice a day for GI Reasons. His memory score is stable on Aricept alone. He gets his medications from the [place]. He does not exercise his memory with puzzles or crosswords etc. He gets no regular exercise.   Had one recent fall, no injuries resulted. His wife is here confirming his memory is more impaired since last year- just last week they went to the cinema together and watched a movie about Laqueta Linden, as a left the cinema [name] [name] asked what the movie was about he had forgotten!. He could also not recall the movie when his wife explained what they had just watched- he believes he was asleep- his wife denied this. His gait has deteriorated.  [name] [name] grew up in [place] and in [place], [place]. He has a lot of interesting stories about his education at [employer] in [place] in the 1940's.   I have the pleasure of seeing [name] [name] today. 07-15-2016, We are talking , once gain, about his 6 years in [place] and his long family history with the [name] . His wife is concerned about his short term memory , he is not. MMSE is stable. He will be 89 next month.  No REM behavior. Had 2 falls I the last 12 month- one while carrying a 25 pound bag of ice melt, refusing any help. One other near fall let to a visit at [facility] ED, was related to arrhythmia. They called Dr. Benn Moulder and were told to come to the ED- no arrhythmia proven.  [name]  MMSE - Mini Mental State Exam 07/16/2018 07/15/2017 07/09/2016 01/04/2016 07/05/2015   Orientation to time 2 4 1 2 3   Orientation to Place 4 4 4 3 3   Registration 3 3 3 3 3   Attention/ Calculation 3 2 4 4 4   Attention/Calculation-comments word not numbers - - - -  Recall 1 1 0 1 1  Language- name 2 objects 2 2 2 2 2   Language- repeat 1 1 1 1 1   Language- follow 3 step command 3 3 3 3 3   Language- read & follow direction 1 1 1 1 1   Write a sentence 1 0 1 1 1   Copy design 0 1 0 1 1  Total score 21 22 20 22 23     HISTORY: [name] [name] is a 86 year old male with a history of  dementia. He returns today for follow-up. He is currently taking Aricept 10 mg and tolerating it well. The patient states that his memory is "good." his wife reports that she feels that it has remained the same. The patient is able to complete all ADLs independently.  The patient has a driver's licenses but no longer drives. His wife does all the driving.  He denies having to give up anything due to his memory. The wife states that the patient's behavior has not been an issue.   On 7-27-16the patient scored 25 out of 30 points and the Mini-Mental Status Examination for the last 12 months he has been very stable on Aricept alone. [name], his primary care physician, asked if Namenda could be added and indeed it could be. I am often combined the 2 medications Aricept and Namenda and they can be taken in the generic form or even in a combination product as one pill called Namziric. I will start [name] [name] on a starter pack for once a day namenda.  He is interested in stopping protonix, and he may get by with Tumms at night. A trial of 4-6 weeks would suffice to show if he is still having reflux.    REVIEW OF SYSTEMS: Out of a complete 14 system review of symptoms, the patient complains only of the following symptoms, imbalance, memory loss, and all other reviewed systems are negative.  ALLERGIES: Allergies  Allergen Reactions  . Amiodarone Other (See Comments)    Had a severe lung infection in  2003  . Antihistamines, Diphenhydramine-Type Other (See Comments)    Jittery    HOME MEDICATIONS: Outpatient Medications Prior to Visit  Medication Sig Dispense Refill  . aspirin EC 81 MG tablet Take 81 mg by mouth daily.    [name] atorvastatin (LIPITOR) 40 MG tablet Take 40 mg by mouth every evening.     . Cholecalciferol (VITAMIN D) 2000 UNITS CAPS Take 2,000 Units by mouth at bedtime.     . donepezil (ARICEPT) 5 MG tablet Take 1 tablet (5 mg total) by mouth 2 (two) times daily. 180 tablet 3  . ferrous sulfate 325 (65 FE) MG EC tablet Take 325 mg by mouth 3 (three) times a week. OTC    . finasteride (PROSCAR) 5 MG tablet Take 5 mg by mouth every evening.     . Multiple Vitamin (MULTIVITAMIN WITH MINERALS) TABS tablet Take 1 tablet by mouth daily. Multivitamin shakes.    . mupirocin ointment (BACTROBAN) 2 % Apply topically 2 (two) times daily.    . sertraline (ZOLOFT) 25 MG tablet Take 25 mg by mouth daily.     No facility-administered medications prior to visit.    PAST MEDICAL HISTORY: Past Medical History:  Diagnosis Date  . Atrial fibrillation, permanent ([place])   . CAD (coronary artery disease)   . Dementia ([place])   . Diverticulosis    diverticular bleed 2011  . High cholesterol   . HTN (hypertension)    has improved over time, no rx as of 2019  . Lower GI bleed     PAST SURGICAL HISTORY: Past Surgical History:  Procedure Laterality Date  . angiolplasty    . APPENDECTOMY    . INSERT / REPLACE / REMOVE PACEMAKER    . INTRAMEDULLARY (IM) NAIL INTERTROCHANTERIC Left 12/26/2018   Procedure: INTRAMEDULLARY (IM) NAIL INTERTROCHANTRIC;  Surgeon: Leandrew Koyanagi, MD;  Location: [facility];  Service: Orthopedics;  Laterality: Left;  . KNEE SURGERY    . PILONIDAL CYST  EXCISION    . rotator cuff surgery  1985  . TONSILLECTOMY    . triple heart bypass      FAMILY HISTORY: Family History  Problem Relation Age of Onset  . Stroke Father   . Cervical cancer Other        siblings  . COPD  Sister   . Dementia Brother     SOCIAL HISTORY: Social History   Socioeconomic History  . Marital status: Married    Spouse name: [name]  . Number of children: 6  . Years of education: college  . Highest education level: Not on file  Occupational History  . Occupation: retired  Tobacco Use  . Smoking status: Former Smoker    Quit date: 07/02/1974    Years since quitting: 45.5  . Smokeless tobacco: Never Used  Vaping Use  . Vaping Use: Never used  Substance and Sexual Activity  . Alcohol use: Not Currently    Comment: last used in dec 2019  . Drug use: No  . Sexual activity: Not on file  Other Topics Concern  . Not on file  Social History Narrative   Patient is married [name]) and lives at home with his wife.   Patient has a college education.   Patient is a retired Materials engineer.   Patient does not drink any caffeine.   Patient has six children.   Social Determinants of Health   Financial Resource Strain:   . Difficulty of Paying Living Expenses:   Food Insecurity:   . Worried About Charity fundraiser in the Last Year:   . Arboriculturist in the Last Year:   Transportation Needs:   . Film/video editor (Medical):   [name] Lack of Transportation (Non-Medical):   Physical Activity:   . Days of Exercise per Week:   . Minutes of Exercise per Session:   Stress:   . Feeling of Stress :   Social Connections:   . Frequency of Communication with Friends and Family:   . Frequency of Social Gatherings with Friends and Family:   . Attends Religious Services:   . Active Member of Clubs or Organizations:   . Attends Archivist Meetings:   [name] Marital Status:   Intimate Partner Violence:   . Fear of Current or Ex-Partner:   . Emotionally Abused:   [name] Physically Abused:   . Sexually Abused:       PHYSICAL EXAM  Vitals:   01/19/20 1534  BP: 132/71  Pulse: 68  Weight: 152 lb (68.9 kg)  Height: 6' (1.829 m)   Body mass index is 20.61  kg/m.  Generalized: Well developed, in no acute distress  Cardiology: normal rate and rhythm, no murmur noted Respiratory: clear to auscultation bilaterally  Neurological examination  Mentation: Alert oriented to time, place, history taking. Follows all commands speech and language fluent Cranial nerve II-XII: Pupils were equal round reactive to light. Extraocular movements were full, visual field were full  Motor: The motor testing reveals 5 over 5 strength of all 4 extremities. Good symmetric motor tone is noted throughout.  Sensory: Sensory testing is intact to soft touch on all 4 extremities. No evidence of extinction is noted.   Gait and station: Gait is short, walks with four prong cane, stooped posture  DIAGNOSTIC DATA (LABS, IMAGING, TESTING) - I reviewed patient records, labs, notes, testing and imaging myself where available.  MMSE - Mini Mental State Exam 01/19/2020 07/06/2019 03/25/2019  Not  completed: - - (No Data)  Orientation to time 0 0 2  Orientation to Place 3 5 3   Registration 3 3 3   Attention/ Calculation 5 2 2   Attention/Calculation-comments - - -  Recall 0 0 0  Language- name 2 objects 2 2 2   Language- repeat 1 0 1  Language- follow 3 step command 3 3 2   Language- follow 3 step command-comments - - he didnt place the paper on his lap or the desk  Language- read & follow direction 1 1 1   Write a sentence 1 1 1   Copy design 1 1 1   Copy design-comments - named 6 animals -  Total score 20 18 18      Lab Results  Component Value Date   WBC 8.8 12/29/2018   HGB 8.8 (A) 01/07/2019   HCT 27 (A) 01/07/2019   MCV 91.5 12/29/2018   PLT 346 01/07/2019      Component Value Date/Time   NA 136 (A) 01/07/2019 0000   K 4.7 01/07/2019 0000   CL 104 12/29/2018 0434   CO2 23 12/29/2018 0434   GLUCOSE 97 12/29/2018 0434   BUN 23 (A) 01/07/2019 0000   CREATININE 1.0 01/07/2019 0000   CREATININE 1.12 12/29/2018 0434   CALCIUM 8.6 (L) 12/29/2018 0434   PROT 7.6  12/25/2018 2106   ALBUMIN 3.1 (L) 12/25/2018 2106   AST 29 12/25/2018 2106   ALT 17 12/25/2018 2106   ALKPHOS 99 12/25/2018 2106   BILITOT 0.2 (L) 12/25/2018 2106   GFRNONAA 57 (L) 12/29/2018 0434   GFRAA >60 12/29/2018 0434   No results found for: CHOL, HDL, LDLCALC, LDLDIRECT, TRIG, CHOLHDL Lab Results  Component Value Date   HGBA1C  12/13/2008    5.5 (NOTE) The ADA recommends the following therapeutic goal for glycemic control related to Hgb A1c measurement: Goal of therapy: <6.5 Hgb A1c  Reference: American Diabetes Association: Clinical Practice Recommendations 2010, Diabetes Care, 2010, 33: (Suppl  1).   No results found for: VITAMINB12 No results found for: TSH     ASSESSMENT AND PLAN 86 y.o. year old male  has a past medical history of Atrial fibrillation, permanent ([place]), CAD (coronary artery disease), Dementia ([place]), Diverticulosis, High cholesterol, HTN (hypertension), and Lower GI bleed. here with     ICD-10-CM   1. Late onset Alzheimer's disease without behavioral disturbance ([place])  G30.1    F23.80     [name] [name] is doing well. He will continue Aricept 5mg  twice daily. We have discussed adding Namenda but he is not interested at this time but may consider in the future. Information provided in AVS. He was encouraged to stay active. Well balanced diet and adequate hydration encouraged. He will continue close follow up with PCP. Memory compensations strategies reviewed. He will follow up in 6 months.    No orders of the defined types were placed in this encounter.    No orders of the defined types were placed in this encounter.     I spent 30 minutes with the patient. 50% of this time was spent counseling and educating patient on plan of care and medications.    Debbora Presto, FNP-C 01/19/2020, 4:05 PM [facility] [address] [place], [place] [address] [phone]

## 2020-02-08 DIAGNOSIS — N1832 Chronic kidney disease, stage 3b: Secondary | ICD-10-CM | POA: Diagnosis not present

## 2020-02-29 ENCOUNTER — Other Ambulatory Visit: Payer: Self-pay

## 2020-02-29 ENCOUNTER — Encounter: Payer: Self-pay | Admitting: Cardiovascular Disease

## 2020-02-29 ENCOUNTER — Ambulatory Visit (INDEPENDENT_AMBULATORY_CARE_PROVIDER_SITE_OTHER): Payer: Medicare Other | Admitting: Cardiovascular Disease

## 2020-02-29 VITALS — BP 92/48 | HR 60 | Ht 70.0 in | Wt 155.2 lb

## 2020-02-29 DIAGNOSIS — I519 Heart disease, unspecified: Secondary | ICD-10-CM

## 2020-02-29 DIAGNOSIS — I712 Thoracic aortic aneurysm, without rupture: Secondary | ICD-10-CM

## 2020-02-29 DIAGNOSIS — Z950 Presence of cardiac pacemaker: Secondary | ICD-10-CM

## 2020-02-29 DIAGNOSIS — I2510 Atherosclerotic heart disease of native coronary artery without angina pectoris: Secondary | ICD-10-CM | POA: Diagnosis not present

## 2020-02-29 DIAGNOSIS — I714 Abdominal aortic aneurysm, without rupture, unspecified: Secondary | ICD-10-CM

## 2020-02-29 DIAGNOSIS — I442 Atrioventricular block, complete: Secondary | ICD-10-CM

## 2020-02-29 DIAGNOSIS — I4821 Permanent atrial fibrillation: Secondary | ICD-10-CM

## 2020-02-29 DIAGNOSIS — I7121 Aneurysm of the ascending aorta, without rupture: Secondary | ICD-10-CM

## 2020-02-29 NOTE — Patient Instructions (Signed)

## 2020-02-29 NOTE — Progress Notes (Signed)
Cardiology Office Note    Date:  02/29/2020   ID:  MONROE QIN, DOB 12-Sep-1925, MRN [id number]  PCP:  [name], MD  Cardiologist:   [name], MD   Chief Complaint  Patient presents with  . Follow-up  Pacemaker check, CAD  History of Present Illness:  [name] [name] is a 86 y.o. male with complete heart block (pacemaker dependent), permanent atrial fibrillation, CAD s/p CABG, Alzheimer's dementia, small ascending thoracic aortic aneurysm (4 cm), small abdominal aortic aneurysm (2.8 cm), hypertension and hyperlipidemia here for follow-up on his pacemaker (2009, [employer] Zephyr XL DR, programmed VVIR).  He is not on anticoagulation due to severe gait instability and falls leading to fractures as well as 2 episodes of severe lower GI bleeding.  From a cardiology perspective he is doing well, but his memory continues to deteriorate. His intake of fluids is poor and he had to have several office visits for prerenal azotemia. He has not had any new falls or bleeding problems. He is still off anticoagulants.  He is usually has a very positive disposition, today for the first time I can order them say "I am on my way out". He acknowledges that he is very cold, although he has to repeatedly ask his wife exactly how old he is.  The patient specifically denies any chest pain at rest exertion, dyspnea at rest or with exertion, orthopnea, paroxysmal nocturnal dyspnea, syncope, palpitations, focal neurological deficits, intermittent claudication, lower extremity edema, unexplained weight gain, cough, hemoptysis or wheezing.  He has permanent atrial fibrillation and his [place] dual-chamber pacemaker is programmed VVIR.  The current generator was implanted in 2009 and still has 1.75-2.0 years of longevity.  The leads were implanted in 2001 and are still functioning at normal parameters.  Device interrogation shows 99% ventricular pacing. He does have an underlying escape  rhythm at about 35 bpm, slightly irregular, probably representing conducted atrial fibrillation rather than idioventricular escape. Lead parameters are excellent.  Echo performed in July 2019 shows borderline LV systolic function with EF of 50-55%, aortic valve sclerosis without stenosis.  He had bypass surgery in 2010 [name], LIMA to LAD, SVG to distal circumflex, SVG to distal RCA) and has not had any signs or symptoms of coronary disease since that time.  He had a surgical maze ablation and ligation of left atrial appendage at that time. His nuclear stress test in August 2014 showed a dense inferolateral scar. Left ventricular ejection fraction was around 50%.   Past Medical History:  Diagnosis Date  . Atrial fibrillation, permanent ([place])   . CAD (coronary artery disease)   . Dementia ([place])   . Diverticulosis    diverticular bleed 2011  . High cholesterol   . HTN (hypertension)    has improved over time, no rx as of 2019  . Lower GI bleed     Past Surgical History:  Procedure Laterality Date  . angiolplasty    . APPENDECTOMY    . INSERT / REPLACE / REMOVE PACEMAKER    . INTRAMEDULLARY (IM) NAIL INTERTROCHANTERIC Left 12/26/2018   Procedure: INTRAMEDULLARY (IM) NAIL INTERTROCHANTRIC;  Surgeon: Leandrew Koyanagi, MD;  Location: [facility];  Service: Orthopedics;  Laterality: Left;  . KNEE SURGERY    . PILONIDAL CYST EXCISION    . rotator cuff surgery  1985  . TONSILLECTOMY    . triple heart bypass      Current Medications: Outpatient Medications Prior to Visit  Medication  Sig Dispense Refill  . aspirin EC 81 MG tablet Take 81 mg by mouth daily.    [name] atorvastatin (LIPITOR) 40 MG tablet Take 40 mg by mouth every evening.     . Cholecalciferol (VITAMIN D) 2000 UNITS CAPS Take 2,000 Units by mouth at bedtime.     . donepezil (ARICEPT) 5 MG tablet Take 1 tablet (5 mg total) by mouth 2 (two) times daily. 180 tablet 3  . ferrous sulfate 325 (65 FE) MG EC tablet Take 325 mg by mouth 3  (three) times a week. OTC    . finasteride (PROSCAR) 5 MG tablet Take 5 mg by mouth every evening.     . Multiple Vitamin (MULTIVITAMIN WITH MINERALS) TABS tablet Take 1 tablet by mouth daily. Multivitamin shakes.    . mupirocin ointment (BACTROBAN) 2 % Apply topically 2 (two) times daily.    . sertraline (ZOLOFT) 25 MG tablet Take 25 mg by mouth daily.     No facility-administered medications prior to visit.     Allergies:   Amiodarone and Antihistamines, diphenhydramine-type   Social History   Socioeconomic History  . Marital status: Married    Spouse name: [name]  . Number of children: 6  . Years of education: college  . Highest education level: Not on file  Occupational History  . Occupation: retired  Tobacco Use  . Smoking status: Former Smoker    Quit date: 07/02/1974    Years since quitting: 45.6  . Smokeless tobacco: Never Used  Vaping Use  . Vaping Use: Never used  Substance and Sexual Activity  . Alcohol use: Not Currently    Comment: last used in dec 2019  . Drug use: No  . Sexual activity: Not on file  Other Topics Concern  . Not on file  Social History Narrative   Patient is married [name]) and lives at home with his wife.   Patient has a college education.   Patient is a retired Materials engineer.   Patient does not drink any caffeine.   Patient has six children.   Social Determinants of Health   Financial Resource Strain:   . Difficulty of Paying Living Expenses: Not on file  Food Insecurity:   . Worried About Charity fundraiser in the Last Year: Not on file  . Ran Out of Food in the Last Year: Not on file  Transportation Needs:   . Lack of Transportation (Medical): Not on file  . Lack of Transportation (Non-Medical): Not on file  Physical Activity:   . Days of Exercise per Week: Not on file  . Minutes of Exercise per Session: Not on file  Stress:   . Feeling of Stress : Not on file  Social Connections:   . Frequency of  Communication with Friends and Family: Not on file  . Frequency of Social Gatherings with Friends and Family: Not on file  . Attends Religious Services: Not on file  . Active Member of Clubs or Organizations: Not on file  . Attends Archivist Meetings: Not on file  . Marital Status: Not on file     Family History:  The patient's family history includes COPD in his sister; Cervical cancer in an other family member; Dementia in his brother; Stroke in his father.   ROS:   Please see the history of present illness.    ROS all other systems are reviewed and are negative.  Some limitations due to memory problems.   PHYSICAL EXAM:  VS:  BP (!) 92/48   Pulse 60   Ht 5\' 10"  (1.778 m)   Wt 155 lb 3.2 oz (70.4 kg)   SpO2 94%   BMI 22.27 kg/m      General: Alert, oriented x3, no distress, appears elderly and frail and very lean. Healthy left subclavian pacemaker site there is no much more prominent with weight loss. Head: no evidence of trauma, PERRL, EOMI, no exophtalmos or lid lag, no myxedema, no xanthelasma; normal ears, nose and oropharynx Neck: normal jugular venous pulsations and no hepatojugular reflux; brisk carotid pulses without delay and no carotid bruits Chest: clear to auscultation, no signs of consolidation by percussion or palpation, normal fremitus, symmetrical and full respiratory excursions Cardiovascular: normal position and quality of the apical impulse, regular rhythm, normal first and paradoxically split second heart sounds, 3/6 early to mid peaking crescendo decrescendo murmur heard loudest at the left lower sternal border rather than in the aortic focus, no diastolic murmurs, rubs or gallops Abdomen: no tenderness or distention, no masses by palpation, no abnormal pulsatility or arterial bruits, normal bowel sounds, no hepatosplenomegaly Extremities: no clubbing, cyanosis or edema; 2+ radial, ulnar and brachial pulses bilaterally; 2+ right femoral, posterior  tibial and dorsalis pedis pulses; 2+ left femoral, posterior tibial and dorsalis pedis pulses; no subclavian or femoral bruits Neurological: grossly nonfocal Psych: Normal mood and affect  Wt Readings from Last 3 Encounters:  02/29/20 155 lb 3.2 oz (70.4 kg)  01/19/20 152 lb (68.9 kg)  08/31/19 154 lb 9.6 oz (70.1 kg)      Studies/Labs Reviewed:   EKG:  EKG is ordered today and shows background atrial fibrillation with 100% ventricular pacing. The QTc interval was 474 ms.[name]  BMET    Component Value Date/Time   NA 136 (A) 01/07/2019 0000   K 4.7 01/07/2019 0000   CL 104 12/29/2018 0434   CO2 23 12/29/2018 0434   GLUCOSE 97 12/29/2018 0434   BUN 23 (A) 01/07/2019 0000   CREATININE 1.0 01/07/2019 0000   CREATININE 1.12 12/29/2018 0434   CALCIUM 8.6 (L) 12/29/2018 0434   GFRNONAA 57 (L) 12/29/2018 0434   GFRAA >60 12/29/2018 0434   Lipid profile 04/08/2019 total cholesterol 141, triglycerides 92, HDL 45, LDL 78  10/07/2019 Cholesterol 136, HDL 48, LDL 73, triglycerides 78 Hemoglobin 11.3, normal liver function tests 02/08/2020  creatinine 1.59, potassium 4.7  ASSESSMENT:    1. CHB (complete heart block) ([place])   2. Permanent atrial fibrillation ([place])   3. Coronary artery disease involving native coronary artery of native heart without angina pectoris   4. Left ventricular systolic dysfunction without heart failure   5. Pacemaker   6. Ascending aortic aneurysm ([place])   7. AAA (abdominal aortic aneurysm) without rupture ([facility])      PLAN:  In order of problems listed above:  1. 2nd/3rd deg AV block: He does have an escape rhythm today, with this has not consistently been present. For safety he should be considered device dependent. 2. AFib: Permanent arrhythmia with very slow ventricular response without medications. Anticoagulation has been stopped due to recurrent falls and recurrent GI bleeding.   CHADSVasc 4 (age 64, CAD, LV dysf). He does have a history of appendage  ligation, so embolic risk is low. 3. CAD s/p CABG: Asymptomatic. Risk factors are well addressed. 4. Borderline LV dysfunction: Borderline left ventricular systolic function without clinical heart failure. Tendency towards hyperkalemia and hypotension. He is not on medications for congestive heart failure for these  reasons.. 5. PM: Comprehensive check in the office today shows normal device function. Pacing threshold is good. Continue in office device checks every 6 months since his device is not amenable to downloads. 6. Ascending aortic aneurysm and abdominal aortic aneurysm: No symptoms of chest or abdominal discomfort.  These have been stable in size and minor abnormalities (4 cm and 2.8 cm respectively). No plan for routine monitoring since he is not a candidate for surgical or even device repair.   Medication Adjustments/Labs and Tests Ordered: Current medicines are reviewed at length with the patient today.  Concerns regarding medicines are outlined above.  Medication changes, Labs and Tests ordered today are listed in the Patient Instructions below. Patient Instructions  Medication Instructions:  No changes *If you need a refill on your cardiac medications before your next appointment, please call your pharmacy*   Lab Work: None ordered If you have labs (blood work) drawn today and your tests are completely normal, you will receive your results only by: [name] MyChart Message (if you have MyChart) OR . A paper copy in the mail If you have any lab test that is abnormal or we need to change your treatment, we will call you to review the results.   Testing/Procedures: None ordered   Follow-Up: At [facility], you and your health needs are our priority.  As part of our continuing mission to provide you with exceptional heart care, we have created designated Provider Care Teams.  These Care Teams include your primary Cardiologist (physician) and Advanced Practice Providers (APPs -   Physician Assistants and Nurse Practitioners) who all work together to provide you with the care you need, when you need it.  We recommend signing up for the patient portal called "MyChart".  Sign up information is provided on this After Visit Summary.  MyChart is used to connect with patients for Virtual Visits (Telemedicine).  Patients are able to view lab/test results, encounter notes, upcoming appointments, etc.  Non-urgent messages can be sent to your provider as well.   To learn more about what you can do with MyChart, go to [email or web address].    Your next appointment:   6 month(s)  The format for your next appointment:   In Person  Provider:   [name], MD     Signed, [name], MD  02/29/2020 1:31 PM    [address], [place], [place]  [address] Phone: [phone]; Fax: [phone]

## 2020-03-02 ENCOUNTER — Encounter: Payer: Self-pay | Admitting: Podiatry

## 2020-03-02 ENCOUNTER — Ambulatory Visit (INDEPENDENT_AMBULATORY_CARE_PROVIDER_SITE_OTHER): Payer: Medicare Other | Admitting: Podiatry

## 2020-03-02 ENCOUNTER — Other Ambulatory Visit: Payer: Self-pay

## 2020-03-02 DIAGNOSIS — N1832 Chronic kidney disease, stage 3b: Secondary | ICD-10-CM

## 2020-03-02 DIAGNOSIS — M79675 Pain in left toe(s): Secondary | ICD-10-CM

## 2020-03-02 DIAGNOSIS — B351 Tinea unguium: Secondary | ICD-10-CM

## 2020-03-02 DIAGNOSIS — M79674 Pain in right toe(s): Secondary | ICD-10-CM

## 2020-03-02 NOTE — Progress Notes (Signed)
This patient returns to the office for evaluation and treatment of long thick painful nails .  This patient is unable to trim his own nails since the patient cannot reach the feet.  Patient says the nails are painful walking and wearing his shoes.  He returns for preventive foot care services. He presents to the office with male caregiver.  General Appearance  Alert, conversant and in no acute stress.  Vascular  Dorsalis pedis and posterior tibial  pulses are weakly  palpable  bilaterally.  Capillary return is within normal limits  bilaterally. Temperature is within normal limits  bilaterally.  Neurologic  Senn-Weinstein monofilament wire test within normal limits  bilaterally. Muscle power within normal limits bilaterally.  Nails Thick disfigured discolored nails with subungual debris  from hallux to fifth toes bilaterally. No evidence of bacterial infection or drainage bilaterally.  Orthopedic  No limitations of motion  feet .  No crepitus or effusions noted.  No bony pathology or digital deformities noted.  Skin  normotropic skin with no porokeratosis noted bilaterally.  No signs of infections or ulcers noted.     Onychomycosis  Pain in toes right foot  Pain in toes left foot  Debridement  of nails  1-5  B/L with a nail nipper.  Nails were then filed using a dremel tool with no incidents.    RTC  10 weeks   Gardiner Barefoot DPM

## 2020-03-14 DIAGNOSIS — L821 Other seborrheic keratosis: Secondary | ICD-10-CM | POA: Diagnosis not present

## 2020-03-14 DIAGNOSIS — C44319 Basal cell carcinoma of skin of other parts of face: Secondary | ICD-10-CM | POA: Diagnosis not present

## 2020-03-14 DIAGNOSIS — I8393 Asymptomatic varicose veins of bilateral lower extremities: Secondary | ICD-10-CM | POA: Diagnosis not present

## 2020-03-14 DIAGNOSIS — L814 Other melanin hyperpigmentation: Secondary | ICD-10-CM | POA: Diagnosis not present

## 2020-03-14 DIAGNOSIS — L853 Xerosis cutis: Secondary | ICD-10-CM | POA: Diagnosis not present

## 2020-03-14 DIAGNOSIS — L918 Other hypertrophic disorders of the skin: Secondary | ICD-10-CM | POA: Diagnosis not present

## 2020-03-14 DIAGNOSIS — L570 Actinic keratosis: Secondary | ICD-10-CM | POA: Diagnosis not present

## 2020-03-14 DIAGNOSIS — L905 Scar conditions and fibrosis of skin: Secondary | ICD-10-CM | POA: Diagnosis not present

## 2020-03-14 DIAGNOSIS — Z85828 Personal history of other malignant neoplasm of skin: Secondary | ICD-10-CM | POA: Diagnosis not present

## 2020-03-14 DIAGNOSIS — D1801 Hemangioma of skin and subcutaneous tissue: Secondary | ICD-10-CM | POA: Diagnosis not present

## 2020-03-14 DIAGNOSIS — D229 Melanocytic nevi, unspecified: Secondary | ICD-10-CM | POA: Diagnosis not present

## 2020-03-15 DIAGNOSIS — C44319 Basal cell carcinoma of skin of other parts of face: Secondary | ICD-10-CM | POA: Diagnosis not present

## 2020-03-21 ENCOUNTER — Inpatient Hospital Stay (HOSPITAL_COMMUNITY)
Admission: EM | Admit: 2020-03-21 | Discharge: 2020-03-24 | DRG: 481 | Disposition: A | Discharge status: Skilled Nursing Facility | Payer: Medicare Other | Attending: Internal Medicine | Admitting: Internal Medicine

## 2020-03-21 ENCOUNTER — Other Ambulatory Visit: Payer: Self-pay

## 2020-03-21 ENCOUNTER — Emergency Department (HOSPITAL_COMMUNITY): Payer: Medicare Other

## 2020-03-21 ENCOUNTER — Telehealth: Payer: Self-pay

## 2020-03-21 ENCOUNTER — Encounter (HOSPITAL_COMMUNITY): Payer: Self-pay | Admitting: Emergency Medicine

## 2020-03-21 DIAGNOSIS — Y92002 Bathroom of unspecified non-institutional (private) residence single-family (private) house as the place of occurrence of the external cause: Secondary | ICD-10-CM | POA: Diagnosis not present

## 2020-03-21 DIAGNOSIS — Z7982 Long term (current) use of aspirin: Secondary | ICD-10-CM | POA: Diagnosis not present

## 2020-03-21 DIAGNOSIS — E860 Dehydration: Secondary | ICD-10-CM | POA: Diagnosis present

## 2020-03-21 DIAGNOSIS — Z888 Allergy status to other drugs, medicaments and biological substances status: Secondary | ICD-10-CM | POA: Diagnosis not present

## 2020-03-21 DIAGNOSIS — F0390 Unspecified dementia without behavioral disturbance: Secondary | ICD-10-CM | POA: Diagnosis present

## 2020-03-21 DIAGNOSIS — D6859 Other primary thrombophilia: Secondary | ICD-10-CM | POA: Diagnosis present

## 2020-03-21 DIAGNOSIS — N400 Enlarged prostate without lower urinary tract symptoms: Secondary | ICD-10-CM | POA: Diagnosis present

## 2020-03-21 DIAGNOSIS — I714 Abdominal aortic aneurysm, without rupture, unspecified: Secondary | ICD-10-CM | POA: Diagnosis present

## 2020-03-21 DIAGNOSIS — S72009A Fracture of unspecified part of neck of unspecified femur, initial encounter for closed fracture: Secondary | ICD-10-CM

## 2020-03-21 DIAGNOSIS — W19XXXA Unspecified fall, initial encounter: Secondary | ICD-10-CM

## 2020-03-21 DIAGNOSIS — I442 Atrioventricular block, complete: Secondary | ICD-10-CM | POA: Diagnosis not present

## 2020-03-21 DIAGNOSIS — R519 Headache, unspecified: Secondary | ICD-10-CM | POA: Diagnosis not present

## 2020-03-21 DIAGNOSIS — Z9181 History of falling: Secondary | ICD-10-CM | POA: Diagnosis not present

## 2020-03-21 DIAGNOSIS — E7800 Pure hypercholesterolemia, unspecified: Secondary | ICD-10-CM | POA: Diagnosis present

## 2020-03-21 DIAGNOSIS — R52 Pain, unspecified: Secondary | ICD-10-CM | POA: Diagnosis not present

## 2020-03-21 DIAGNOSIS — I2510 Atherosclerotic heart disease of native coronary artery without angina pectoris: Secondary | ICD-10-CM | POA: Diagnosis not present

## 2020-03-21 DIAGNOSIS — Z950 Presence of cardiac pacemaker: Secondary | ICD-10-CM | POA: Diagnosis not present

## 2020-03-21 DIAGNOSIS — E785 Hyperlipidemia, unspecified: Secondary | ICD-10-CM | POA: Diagnosis present

## 2020-03-21 DIAGNOSIS — S72001D Fracture of unspecified part of neck of right femur, subsequent encounter for closed fracture with routine healing: Secondary | ICD-10-CM | POA: Diagnosis not present

## 2020-03-21 DIAGNOSIS — Z043 Encounter for examination and observation following other accident: Secondary | ICD-10-CM | POA: Diagnosis not present

## 2020-03-21 DIAGNOSIS — Z20822 Contact with and (suspected) exposure to covid-19: Secondary | ICD-10-CM | POA: Diagnosis present

## 2020-03-21 DIAGNOSIS — R5381 Other malaise: Secondary | ICD-10-CM | POA: Diagnosis not present

## 2020-03-21 DIAGNOSIS — I712 Thoracic aortic aneurysm, without rupture: Secondary | ICD-10-CM | POA: Diagnosis not present

## 2020-03-21 DIAGNOSIS — R41841 Cognitive communication deficit: Secondary | ICD-10-CM | POA: Diagnosis not present

## 2020-03-21 DIAGNOSIS — I959 Hypotension, unspecified: Secondary | ICD-10-CM | POA: Diagnosis not present

## 2020-03-21 DIAGNOSIS — R2681 Unsteadiness on feet: Secondary | ICD-10-CM | POA: Diagnosis not present

## 2020-03-21 DIAGNOSIS — I447 Left bundle-branch block, unspecified: Secondary | ICD-10-CM | POA: Diagnosis not present

## 2020-03-21 DIAGNOSIS — Z66 Do not resuscitate: Secondary | ICD-10-CM | POA: Diagnosis present

## 2020-03-21 DIAGNOSIS — M2550 Pain in unspecified joint: Secondary | ICD-10-CM | POA: Diagnosis not present

## 2020-03-21 DIAGNOSIS — I10 Essential (primary) hypertension: Secondary | ICD-10-CM

## 2020-03-21 DIAGNOSIS — D6869 Other thrombophilia: Secondary | ICD-10-CM

## 2020-03-21 DIAGNOSIS — S72142S Displaced intertrochanteric fracture of left femur, sequela: Secondary | ICD-10-CM | POA: Diagnosis not present

## 2020-03-21 DIAGNOSIS — W1830XA Fall on same level, unspecified, initial encounter: Secondary | ICD-10-CM | POA: Diagnosis present

## 2020-03-21 DIAGNOSIS — Z79899 Other long term (current) drug therapy: Secondary | ICD-10-CM

## 2020-03-21 DIAGNOSIS — Z87891 Personal history of nicotine dependence: Secondary | ICD-10-CM

## 2020-03-21 DIAGNOSIS — F329 Major depressive disorder, single episode, unspecified: Secondary | ICD-10-CM | POA: Diagnosis present

## 2020-03-21 DIAGNOSIS — R29898 Other symptoms and signs involving the musculoskeletal system: Secondary | ICD-10-CM | POA: Diagnosis not present

## 2020-03-21 DIAGNOSIS — R1312 Dysphagia, oropharyngeal phase: Secondary | ICD-10-CM | POA: Diagnosis not present

## 2020-03-21 DIAGNOSIS — S72141A Displaced intertrochanteric fracture of right femur, initial encounter for closed fracture: Principal | ICD-10-CM | POA: Diagnosis present

## 2020-03-21 DIAGNOSIS — R410 Disorientation, unspecified: Secondary | ICD-10-CM | POA: Diagnosis not present

## 2020-03-21 DIAGNOSIS — I739 Peripheral vascular disease, unspecified: Secondary | ICD-10-CM | POA: Diagnosis present

## 2020-03-21 DIAGNOSIS — S72001A Fracture of unspecified part of neck of right femur, initial encounter for closed fracture: Secondary | ICD-10-CM | POA: Diagnosis not present

## 2020-03-21 DIAGNOSIS — G301 Alzheimer's disease with late onset: Secondary | ICD-10-CM

## 2020-03-21 DIAGNOSIS — N179 Acute kidney failure, unspecified: Secondary | ICD-10-CM | POA: Diagnosis present

## 2020-03-21 DIAGNOSIS — F419 Anxiety disorder, unspecified: Secondary | ICD-10-CM | POA: Diagnosis present

## 2020-03-21 DIAGNOSIS — M6281 Muscle weakness (generalized): Secondary | ICD-10-CM | POA: Diagnosis not present

## 2020-03-21 DIAGNOSIS — I4821 Permanent atrial fibrillation: Secondary | ICD-10-CM

## 2020-03-21 DIAGNOSIS — K5909 Other constipation: Secondary | ICD-10-CM | POA: Diagnosis not present

## 2020-03-21 DIAGNOSIS — I519 Heart disease, unspecified: Secondary | ICD-10-CM | POA: Diagnosis not present

## 2020-03-21 DIAGNOSIS — Z03818 Encounter for observation for suspected exposure to other biological agents ruled out: Secondary | ICD-10-CM | POA: Diagnosis not present

## 2020-03-21 DIAGNOSIS — I719 Aortic aneurysm of unspecified site, without rupture: Secondary | ICD-10-CM | POA: Diagnosis not present

## 2020-03-21 DIAGNOSIS — M542 Cervicalgia: Secondary | ICD-10-CM | POA: Diagnosis not present

## 2020-03-21 DIAGNOSIS — F0280 Dementia in other diseases classified elsewhere without behavioral disturbance: Secondary | ICD-10-CM | POA: Diagnosis not present

## 2020-03-21 DIAGNOSIS — M25551 Pain in right hip: Secondary | ICD-10-CM | POA: Diagnosis not present

## 2020-03-21 DIAGNOSIS — Z7401 Bed confinement status: Secondary | ICD-10-CM | POA: Diagnosis not present

## 2020-03-21 LAB — BASIC METABOLIC PANEL
Anion gap: 11 (ref 5–15)
BUN: 28 mg/dL — ABNORMAL HIGH (ref 8–23)
CO2: 23 mmol/L (ref 22–32)
Calcium: 9.2 mg/dL (ref 8.9–10.3)
Chloride: 105 mmol/L (ref 98–111)
Creatinine, Ser: 1.56 mg/dL — ABNORMAL HIGH (ref 0.61–1.24)
GFR calc Af Amer: 44 mL/min — ABNORMAL LOW (ref >60)
GFR calc non Af Amer: 38 mL/min — ABNORMAL LOW (ref >60)
Glucose, Bld: 88 mg/dL (ref 70–99)
Potassium: 4.5 mmol/L (ref 3.5–5.1)
Sodium: 139 mmol/L (ref 135–145)

## 2020-03-21 LAB — CBC WITH DIFFERENTIAL/PLATELET
Abs Immature Granulocytes: 0.02 10*3/uL (ref 0.00–0.07)
Basophils Absolute: 0 10*3/uL (ref 0.0–0.1)
Basophils Relative: 1 %
Eosinophils Absolute: 0.2 10*3/uL (ref 0.0–0.5)
Eosinophils Relative: 4 %
HCT: 37.4 % — ABNORMAL LOW (ref 39.0–52.0)
Hemoglobin: 11.5 g/dL — ABNORMAL LOW (ref 13.0–17.0)
Immature Granulocytes: 0 %
Lymphocytes Relative: 22 %
Lymphs Abs: 1.4 10*3/uL (ref 0.7–4.0)
MCH: 31.3 pg (ref 26.0–34.0)
MCHC: 30.7 g/dL (ref 30.0–36.0)
MCV: 101.9 fL — ABNORMAL HIGH (ref 80.0–100.0)
Monocytes Absolute: 0.9 10*3/uL (ref 0.1–1.0)
Monocytes Relative: 14 %
Neutro Abs: 3.8 10*3/uL (ref 1.7–7.7)
Neutrophils Relative %: 59 %
Platelets: 209 10*3/uL (ref 150–400)
RBC: 3.67 MIL/uL — ABNORMAL LOW (ref 4.22–5.81)
RDW: 14.4 % (ref 11.5–15.5)
WBC: 6.3 10*3/uL (ref 4.0–10.5)
nRBC: 0 % (ref 0.0–0.2)

## 2020-03-21 LAB — URINALYSIS, ROUTINE W REFLEX MICROSCOPIC
Bilirubin Urine: NEGATIVE
Glucose, UA: NEGATIVE mg/dL
Ketones, ur: NEGATIVE mg/dL
Nitrite: NEGATIVE
Protein, ur: 30 mg/dL — AB
Specific Gravity, Urine: 1.01 (ref 1.005–1.030)
WBC, UA: >50 WBC/hpf — ABNORMAL HIGH (ref 0–5)
pH: 6 (ref 5.0–8.0)

## 2020-03-21 LAB — PROTIME-INR
INR: 1.2 (ref 0.8–1.2)
Prothrombin Time: 14.2 seconds (ref 11.4–15.2)

## 2020-03-21 LAB — TYPE AND SCREEN
ABO/RH(D): O POS
Antibody Screen: NEGATIVE

## 2020-03-21 LAB — SARS CORONAVIRUS 2 BY RT PCR (HOSPITAL ORDER, PERFORMED IN ~~LOC~~ HOSPITAL LAB): SARS Coronavirus 2: NEGATIVE

## 2020-03-21 MED ORDER — METHOCARBAMOL 500 MG PO TABS
500.0000 mg | ORAL_TABLET | Freq: Four times a day (QID) | ORAL | Status: DC | PRN
  Administered 2020-03-21: 500 mg via ORAL
  Filled 2020-03-21: qty 1

## 2020-03-21 MED ORDER — HYDROMORPHONE HCL 1 MG/ML IJ SOLN
0.5000 mg | INTRAMUSCULAR | Status: DC
  Administered 2020-03-21 – 2020-03-22 (×3): 0.5 mg via INTRAVENOUS
  Filled 2020-03-21 (×3): qty 0.5

## 2020-03-21 MED ORDER — POLYETHYLENE GLYCOL 3350 17 G PO PACK: 17.0000 g | PACK | Freq: Every day | ORAL | Status: DC | PRN

## 2020-03-21 MED ORDER — FENTANYL CITRATE (PF) 100 MCG/2ML IJ SOLN
50.0000 ug | Freq: Once | INTRAMUSCULAR | Status: AC
  Administered 2020-03-21: 50 ug via INTRAVENOUS
  Filled 2020-03-21: qty 2

## 2020-03-21 MED ORDER — DONEPEZIL HCL 5 MG PO TABS
5.0000 mg | ORAL_TABLET | Freq: Two times a day (BID) | ORAL | Status: DC
  Administered 2020-03-21 – 2020-03-24 (×6): 5 mg via ORAL
  Filled 2020-03-21 (×7): qty 1

## 2020-03-21 MED ORDER — DIPHENHYDRAMINE HCL 12.5 MG/5ML PO ELIX: 12.5000 mg | ORAL_SOLUTION | Freq: Four times a day (QID) | ORAL | Status: DC | PRN

## 2020-03-21 MED ORDER — BISACODYL 5 MG PO TBEC: 5.0000 mg | DELAYED_RELEASE_TABLET | Freq: Every day | ORAL | Status: DC | PRN

## 2020-03-21 MED ORDER — FINASTERIDE 5 MG PO TABS
5.0000 mg | ORAL_TABLET | Freq: Every evening | ORAL | Status: DC
  Administered 2020-03-23: 5 mg via ORAL
  Filled 2020-03-21 (×3): qty 1

## 2020-03-21 MED ORDER — DOCUSATE SODIUM 100 MG PO CAPS
100.0000 mg | ORAL_CAPSULE | Freq: Two times a day (BID) | ORAL | Status: DC
  Administered 2020-03-21: 100 mg via ORAL
  Filled 2020-03-21: qty 1

## 2020-03-21 MED ORDER — SODIUM CHLORIDE 0.9 % IV BOLUS
500.0000 mL | Freq: Once | INTRAVENOUS | Status: AC
  Administered 2020-03-21: 500 mL via INTRAVENOUS

## 2020-03-21 MED ORDER — DIPHENHYDRAMINE HCL 50 MG/ML IJ SOLN: 12.5000 mg | Freq: Four times a day (QID) | INTRAMUSCULAR | Status: DC | PRN

## 2020-03-21 MED ORDER — SODIUM CHLORIDE 0.9% FLUSH: 9.0000 mL | INTRAVENOUS | Status: DC | PRN

## 2020-03-21 MED ORDER — MORPHINE SULFATE 2 MG/ML IV SOLN: INTRAVENOUS | Status: DC

## 2020-03-21 MED ORDER — ATORVASTATIN CALCIUM 40 MG PO TABS
40.0000 mg | ORAL_TABLET | Freq: Every evening | ORAL | Status: DC
  Administered 2020-03-23: 40 mg via ORAL
  Filled 2020-03-21 (×2): qty 1

## 2020-03-21 MED ORDER — HYDROMORPHONE HCL 1 MG/ML IJ SOLN
0.5000 mg | INTRAMUSCULAR | Status: DC | PRN
  Administered 2020-03-21: 0.5 mg via INTRAVENOUS
  Filled 2020-03-21: qty 1

## 2020-03-21 MED ORDER — NALOXONE HCL 0.4 MG/ML IJ SOLN: 0.4000 mg | INTRAMUSCULAR | Status: DC | PRN

## 2020-03-21 MED ORDER — ONDANSETRON HCL 4 MG/2ML IJ SOLN: 4.0000 mg | Freq: Four times a day (QID) | INTRAMUSCULAR | Status: DC | PRN

## 2020-03-21 MED ORDER — SERTRALINE HCL 25 MG PO TABS
25.0000 mg | ORAL_TABLET | Freq: Every day | ORAL | Status: DC
  Administered 2020-03-22 – 2020-03-24 (×3): 25 mg via ORAL
  Filled 2020-03-21 (×4): qty 1

## 2020-03-21 MED ORDER — METHOCARBAMOL 1000 MG/10ML IJ SOLN
500.0000 mg | Freq: Four times a day (QID) | INTRAVENOUS | Status: DC | PRN
  Filled 2020-03-21 (×2): qty 5

## 2020-03-21 MED ORDER — ASPIRIN EC 81 MG PO TBEC: 81.0000 mg | DELAYED_RELEASE_TABLET | Freq: Every day | ORAL | Status: DC

## 2020-03-21 NOTE — H&P (View-Only) (Signed)
ORTHOPAEDIC CONSULTATION  REQUESTING PHYSICIAN: Karmen Bongo, MD  Chief Complaint:   HPI: [name] [name] is a 86 y.o. male who presents with acute fall today,  he states he is not aware that he fell states he remembers breaking his other hip in the past but does not remember his acute injury today and sustaining an intertrochanteric hip fracture.  Patient has a history of dementia.  Patient states that he lives with his wife and children at home.  Past Medical History:  Diagnosis Date  . Atrial fibrillation, permanent ([place])    no longer on AC due to GI bleed  . CAD (coronary artery disease)   . Dementia ([place])   . Diverticulosis    diverticular bleed 2011  . High cholesterol   . HTN (hypertension)    has improved over time, no rx as of 2019  . Lower GI bleed    Past Surgical History:  Procedure Laterality Date  . angiolplasty    . APPENDECTOMY    . INSERT / REPLACE / REMOVE PACEMAKER    . INTRAMEDULLARY (IM) NAIL INTERTROCHANTERIC Left 12/26/2018   Procedure: INTRAMEDULLARY (IM) NAIL INTERTROCHANTRIC;  Surgeon: Leandrew Koyanagi, MD;  Location: [facility];  Service: Orthopedics;  Laterality: Left;  . KNEE SURGERY    . PILONIDAL CYST EXCISION    . rotator cuff surgery  1985  . TONSILLECTOMY    . triple heart bypass     Social History   Socioeconomic History  . Marital status: Married    Spouse name: [name] [name]  . Number of children: 6  . Years of education: college  . Highest education level: Not on file  Occupational History  . Occupation: retired  Tobacco Use  . Smoking status: Former Smoker    Quit date: 07/02/1974    Years since quitting: 45.7  . Smokeless tobacco: Never Used  Vaping Use  . Vaping Use: Never used  Substance and Sexual Activity  . Alcohol use: Not Currently    Comment: last used in dec 2019  . Drug use: No  . Sexual activity: Not on file  Other Topics Concern  . Not on file  Social History Narrative   Patient is married [name] [name]) and lives  at home with his wife.   Patient has a college education.   Patient is a retired Materials engineer.   Patient does not drink any caffeine.   Patient has six children.   Social Determinants of Health   Financial Resource Strain:   . Difficulty of Paying Living Expenses: Not on file  Food Insecurity:   . Worried About Charity fundraiser in the Last Year: Not on file  . Ran Out of Food in the Last Year: Not on file  Transportation Needs:   . Lack of Transportation (Medical): Not on file  . Lack of Transportation (Non-Medical): Not on file  Physical Activity:   . Days of Exercise per Week: Not on file  . Minutes of Exercise per Session: Not on file  Stress:   . Feeling of Stress : Not on file  Social Connections:   . Frequency of Communication with Friends and Family: Not on file  . Frequency of Social Gatherings with Friends and Family: Not on file  . Attends Religious Services: Not on file  . Active Member of Clubs or Organizations: Not on file  . Attends Archivist Meetings: Not on file  . Marital Status: Not on file  Family History  Problem Relation Age of Onset  . Stroke Father   . Cervical cancer Other        siblings  . COPD Sister   . Dementia Brother    - negative except otherwise stated in the family history section Allergies  Allergen Reactions  . Amiodarone Other (See Comments)    Had a severe lung infection in 2003  . Antihistamines, Diphenhydramine-Type Other (See Comments)    Jittery   Prior to Admission medications   Medication Sig Start Date End Date Taking? Authorizing Provider  aspirin EC 81 MG tablet Take 81 mg by mouth daily.   Yes [provider]  atorvastatin (LIPITOR) 40 MG tablet Take 40 mg by mouth every evening.    Yes [provider]  Cholecalciferol (VITAMIN D) 2000 UNITS CAPS Take 2,000 Units by mouth at bedtime.    Yes [provider]  donepezil (ARICEPT) 5 MG tablet Take 1 tablet (5 mg total)  by mouth 2 (two) times daily. 03/25/19  Yes Lomax, Amy, NP  ferrous sulfate 325 (65 FE) MG EC tablet Take 325 mg by mouth 3 (three) times a week. Monday, Wednesday and Friday morning.   Yes [provider]  finasteride (PROSCAR) 5 MG tablet Take 5 mg by mouth every evening.    Yes [provider]  Multiple Vitamin (MULTIVITAMIN WITH MINERALS) TABS tablet Take 1 tablet by mouth daily.    Yes [provider]  sertraline (ZOLOFT) 25 MG tablet Take 25 mg by mouth daily. 07/07/18  Yes [provider]   DG Chest 1 View  Result Date: 03/21/2020 CLINICAL DATA:  [place] and fractured right hip. EXAM: CHEST  1 VIEW COMPARISON:  Chest x-ray 12/25/2018 FINDINGS: The right atrial and right ventricular pacer wires are in good position, unchanged. The heart is normal in size. Stable tortuosity and calcification of the thoracic aorta. Stable surgical changes from bypass surgery. Chronic bronchitic type interstitial lung changes but no acute pulmonary findings. No pleural effusion or pneumothorax. The bony thorax is grossly intact. IMPRESSION: Chronic lung changes but no acute pulmonary findings. Electronically Signed   By: Marijo Sanes M.D.   On: 03/21/2020 12:54   CT HEAD WO CONTRAST  Result Date: 03/21/2020 CLINICAL DATA:  Pain following fall EXAM: CT HEAD WITHOUT CONTRAST CT CERVICAL SPINE WITHOUT CONTRAST TECHNIQUE: Multidetector CT imaging of the head and cervical spine was performed following the standard protocol without intravenous contrast. Multiplanar CT image reconstructions of the cervical spine were also generated. COMPARISON:  CT head; CT cervical spine June 05, 2018 FINDINGS: CT HEAD FINDINGS Brain: Moderate diffuse atrophy is stable. There is no intracranial mass, hemorrhage, extra-axial fluid collection, or midline shift. There is patchy small vessel disease in the centra semiovale bilaterally. No acute infarct is appreciable. Vascular: No hyperdense vessel. There is  calcification in each carotid siphon region. Skull: Bony calvarium appears intact. Sinuses/Orbits: There is mucosal thickening in several ethmoid air cells. Other paranasal sinuses are clear. Orbits appear symmetric bilaterally. Other: Mastoid air cells are clear. CT CERVICAL SPINE FINDINGS Alignment: There is 3 mm of anterolisthesis of C7 on T1, stable. There is no new spondylolisthesis. Skull base and vertebrae: There is pannus posterior to the odontoid without impression on the craniocervical junction. There is extensive erosion in the odontoid, stable from prior study. Bones are osteoporotic. No acute fracture is appreciable. No blastic or lytic bone lesions are evident. Soft tissues and spinal canal: Prevertebral soft tissues and predental space regions  are normal. There is no appreciable cord or canal hematoma. No paraspinous lesions. Disc levels: There is severe disc space narrowing at C4-5, C5-6, and C7-T1. There is ankylosis at C6-7 and T1-2, stable. There is facet osteoarthritic change to varying degrees at all levels. Exit foraminal narrowing is noted at C3-4 bilaterally CT and at C5-6 bilaterally with impression on exiting nerve roots. A lesser degree of arthropathy is noted at other levels. No disc extrusion or high-grade stenosis. Note that there is moderate broad-based disc bulging at C5-6 and C6-7. Upper chest: There is emphysematous change in the upper lobes. No edema or consolidation noted in the upper lobe regions. Other: There are foci of subclavian and carotid artery calcification bilaterally. IMPRESSION: CT head: Stable atrophy with periventricular small vessel disease. No mass or hemorrhage. No extra-axial fluid collection. No acute infarct. There are foci of arterial vascular calcification. There is mucosal thickening in several ethmoid air cells. CT cervical spine: Diffuse osteoporosis. No fracture. Stable 3 mm of anterolisthesis of C7 on T1. No new spondylolisthesis. Multilevel arthropathy,  essentially stable. Impression on exiting nerve roots, most severe at C3-4 and C5-6 bilaterally. No frank disc extrusion or high-grade stenosis. Underlying emphysematous change. Foci of subclavian and carotid artery calcification noted bilaterally. Electronically Signed   By: Lowella Grip III M.D.   On: 03/21/2020 13:29   CT CERVICAL SPINE WO CONTRAST  Result Date: 03/21/2020 CLINICAL DATA:  Pain following fall EXAM: CT HEAD WITHOUT CONTRAST CT CERVICAL SPINE WITHOUT CONTRAST TECHNIQUE: Multidetector CT imaging of the head and cervical spine was performed following the standard protocol without intravenous contrast. Multiplanar CT image reconstructions of the cervical spine were also generated. COMPARISON:  CT head; CT cervical spine June 05, 2018 FINDINGS: CT HEAD FINDINGS Brain: Moderate diffuse atrophy is stable. There is no intracranial mass, hemorrhage, extra-axial fluid collection, or midline shift. There is patchy small vessel disease in the centra semiovale bilaterally. No acute infarct is appreciable. Vascular: No hyperdense vessel. There is calcification in each carotid siphon region. Skull: Bony calvarium appears intact. Sinuses/Orbits: There is mucosal thickening in several ethmoid air cells. Other paranasal sinuses are clear. Orbits appear symmetric bilaterally. Other: Mastoid air cells are clear. CT CERVICAL SPINE FINDINGS Alignment: There is 3 mm of anterolisthesis of C7 on T1, stable. There is no new spondylolisthesis. Skull base and vertebrae: There is pannus posterior to the odontoid without impression on the craniocervical junction. There is extensive erosion in the odontoid, stable from prior study. Bones are osteoporotic. No acute fracture is appreciable. No blastic or lytic bone lesions are evident. Soft tissues and spinal canal: Prevertebral soft tissues and predental space regions are normal. There is no appreciable cord or canal hematoma. No paraspinous lesions. Disc levels:  There is severe disc space narrowing at C4-5, C5-6, and C7-T1. There is ankylosis at C6-7 and T1-2, stable. There is facet osteoarthritic change to varying degrees at all levels. Exit foraminal narrowing is noted at C3-4 bilaterally CT and at C5-6 bilaterally with impression on exiting nerve roots. A lesser degree of arthropathy is noted at other levels. No disc extrusion or high-grade stenosis. Note that there is moderate broad-based disc bulging at C5-6 and C6-7. Upper chest: There is emphysematous change in the upper lobes. No edema or consolidation noted in the upper lobe regions. Other: There are foci of subclavian and carotid artery calcification bilaterally. IMPRESSION: CT head: Stable atrophy with periventricular small vessel disease. No mass or hemorrhage. No extra-axial fluid collection. No acute infarct. There  are foci of arterial vascular calcification. There is mucosal thickening in several ethmoid air cells. CT cervical spine: Diffuse osteoporosis. No fracture. Stable 3 mm of anterolisthesis of C7 on T1. No new spondylolisthesis. Multilevel arthropathy, essentially stable. Impression on exiting nerve roots, most severe at C3-4 and C5-6 bilaterally. No frank disc extrusion or high-grade stenosis. Underlying emphysematous change. Foci of subclavian and carotid artery calcification noted bilaterally. Electronically Signed   By: Lowella Grip III M.D.   On: 03/21/2020 13:29   DG Hip Unilat With Pelvis 2-3 Views Right  Result Date: 03/21/2020 CLINICAL DATA:  Fall, right hip pain EXAM: DG HIP (WITH OR WITHOUT PELVIS) 2-3V RIGHT COMPARISON:  12/25/2018 FINDINGS: Right femoral intertrochanteric fracture noted. Minimal displacement. No significant angulation. No no subluxation or dislocation. Hardware seen within the left proximal femur. IMPRESSION: Right femoral intertrochanteric fracture. Electronically Signed   By: Rolm Baptise M.D.   On: 03/21/2020 12:53   - pertinent xrays, CT, MRI studies were  reviewed and independently interpreted  Positive ROS: All other systems have been reviewed and were otherwise negative with the exception of those mentioned in the HPI and as above.  Physical Exam: General: Alert, no acute distress Psychiatric: Patient is competent for consent with normal mood and affect Lymphatic: No axillary or cervical lymphadenopathy Cardiovascular: No pedal edema Respiratory: No cyanosis, no use of accessory musculature GI: No organomegaly, abdomen is soft and non-tender    Images:  @ENCIMAGES @  Labs:  Lab Results  Component Value Date   HGBA1C  12/13/2008    5.5 (NOTE) The ADA recommends the following therapeutic goal for glycemic control related to Hgb A1c measurement: Goal of therapy: <6.5 Hgb A1c  Reference: American Diabetes Association: Clinical Practice Recommendations 2010, Diabetes Care, 2010, 33: (Suppl  1).   REPTSTATUS 06/10/2018 FINAL 06/05/2018   CULT  06/05/2018    NO GROWTH 5 DAYS Performed at [facility], [place] [address] [place], [place] [address]     Lab Results  Component Value Date   ALBUMIN 3.1 (L) 12/25/2018   ALBUMIN 2.3 (L) 06/08/2018   ALBUMIN 2.6 (L) 06/05/2018    Neurologic: Patient does not have protective sensation bilateral lower extremities.   MUSCULOSKELETAL:   Skin: Examination of the skin is intact.  The right lower extremity is shortened and externally rotated I cannot feel a good dorsalis pedis or posterior tibial pulse.  Radiographs were reviewed which shows a intertrochanteric hip fracture on the right with significant peripheral vascular disease with calcification of the vessels around the hip.  CT of the head and neck shows no acute changes no hemorrhage.  Assessment: Assessment: Acute intertrochanteric right hip fracture with dementia with previous internal fixation of the left intertrochanteric hip fracture.  Plan: Plan: Will plan for surgical intervention tomorrow.  We will make the  patient n.p.o. after midnight tonight.  Risks and benefits of surgery were discussed.    I called and discussed the patient's hip fracture with his wife, she states she does have power of attorney for medical decisions.  She will be back by tomorrow and will sign his operative permit.  Anticipate surgery tomorrow around 4 to  5 PM.  Thank you for the consult and the opportunity to see Mr. Maxtyn Nuzum, [place] [phone] 5:50 PM

## 2020-03-21 NOTE — ED Triage Notes (Signed)
Pt BIB [facility] from home. Pt had a mechanical fall this morning while trying to get up from the toilet. Pt states that he slipped in his socks. Pt did hit his head but did not lose consciousness. Pt presents with right leg shortening and rotation. Pt with history of femur fx. VSS. NAD.

## 2020-03-21 NOTE — H&P (Addendum)
History and Physical    [name] [name] UDJ:[id number] DOB: 26-Apr-1926 DOA: 03/21/2020  PCP: [name], MD Consultants:  Prudence Davidson - podiatry; Croitoru - cardiology; Xu - orthopedics; Dohmeier - neurology Patient coming from:  Home - lives with wife; NOK: Wife, Bravery Ketcham, [place]  Chief Complaint: Fall  HPI: [name] [name] is a 86 y.o. male with medical history significant of lower GI bleed; HTN; HLD; dementia; afib; and CAD presenting with a fall.  He had breakfast this AM and went into the bathroom.  He got up off the toilet seat and fell and was unable to get up.  His son couldn't get him up so they called 911.  He doesn't remember if he got light headed or dizzy, but this is not something he ever complains of.  He felt well prior.  He broke his left femur about 14 months ago, went to rehab at [facility].   ED Course:  R hip fracture.  Slight AKI.  Ortho consulted, Dr. Jess Barters group to see.  Review of Systems: As per HPI; otherwise review of systems reviewed and negative.   Ambulatory Status:  Ambulates with a cane  COVID Vaccine Status:  Complete, due for booster  Past Medical History:  Diagnosis Date  . Atrial fibrillation, permanent ([place])    no longer on AC due to GI bleed  . CAD (coronary artery disease)   . Dementia ([place])   . Diverticulosis    diverticular bleed 2011  . High cholesterol   . HTN (hypertension)    has improved over time, no rx as of 2019  . Lower GI bleed     Past Surgical History:  Procedure Laterality Date  . angiolplasty    . APPENDECTOMY    . INSERT / REPLACE / REMOVE PACEMAKER    . INTRAMEDULLARY (IM) NAIL INTERTROCHANTERIC Left 12/26/2018   Procedure: INTRAMEDULLARY (IM) NAIL INTERTROCHANTRIC;  Surgeon: Leandrew Koyanagi, MD;  Location: [facility];  Service: Orthopedics;  Laterality: Left;  . KNEE SURGERY    . PILONIDAL CYST EXCISION    . rotator cuff surgery  1985  . TONSILLECTOMY    . triple heart bypass      Social History    Socioeconomic History  . Marital status: Married    Spouse name: [name]  . Number of children: 6  . Years of education: college  . Highest education level: Not on file  Occupational History  . Occupation: retired  Tobacco Use  . Smoking status: Former Smoker    Quit date: 07/02/1974    Years since quitting: 45.7  . Smokeless tobacco: Never Used  Vaping Use  . Vaping Use: Never used  Substance and Sexual Activity  . Alcohol use: Not Currently    Comment: last used in dec 2019  . Drug use: No  . Sexual activity: Not on file  Other Topics Concern  . Not on file  Social History Narrative   Patient is married [name]) and lives at home with his wife.   Patient has a college education.   Patient is a retired Materials engineer.   Patient does not drink any caffeine.   Patient has six children.   Social Determinants of Health   Financial Resource Strain:   . Difficulty of Paying Living Expenses: Not on file  Food Insecurity:   . Worried About Charity fundraiser in the Last Year: Not on file  . Ran Out of Food in the Last Year: Not  on file  Transportation Needs:   . Lack of Transportation (Medical): Not on file  . Lack of Transportation (Non-Medical): Not on file  Physical Activity:   . Days of Exercise per Week: Not on file  . Minutes of Exercise per Session: Not on file  Stress:   . Feeling of Stress : Not on file  Social Connections:   . Frequency of Communication with Friends and Family: Not on file  . Frequency of Social Gatherings with Friends and Family: Not on file  . Attends Religious Services: Not on file  . Active Member of Clubs or Organizations: Not on file  . Attends Archivist Meetings: Not on file  . Marital Status: Not on file  Intimate Partner Violence:   . Fear of Current or Ex-Partner: Not on file  . Emotionally Abused: Not on file  . Physically Abused: Not on file  . Sexually Abused: Not on file    Allergies  Allergen  Reactions  . Amiodarone Other (See Comments)    Had a severe lung infection in 2003  . Antihistamines, Diphenhydramine-Type Other (See Comments)    Jittery    Family History  Problem Relation Age of Onset  . Stroke Father   . Cervical cancer Other        siblings  . COPD Sister   . Dementia Brother     Prior to Admission medications   Medication Sig Start Date End Date Taking? Authorizing Provider  aspirin EC 81 MG tablet Take 81 mg by mouth daily.    [provider]  atorvastatin (LIPITOR) 40 MG tablet Take 40 mg by mouth every evening.     [provider]  Cholecalciferol (VITAMIN D) 2000 UNITS CAPS Take 2,000 Units by mouth at bedtime.     [provider]  donepezil (ARICEPT) 5 MG tablet Take 1 tablet (5 mg total) by mouth 2 (two) times daily. 03/25/19   Lomax, Amy, NP  ferrous sulfate 325 (65 FE) MG EC tablet Take 325 mg by mouth 3 (three) times a week. OTC    [provider]  finasteride (PROSCAR) 5 MG tablet Take 5 mg by mouth every evening.     [provider]  Multiple Vitamin (MULTIVITAMIN WITH MINERALS) TABS tablet Take 1 tablet by mouth daily. Multivitamin shakes.    [provider]  mupirocin ointment (BACTROBAN) 2 % Apply topically 2 (two) times daily. 11/04/19   [provider]  sertraline (ZOLOFT) 25 MG tablet Take 25 mg by mouth daily. 07/07/18   [provider]    Physical Exam: Vitals:   03/21/20 1200 03/21/20 1202 03/21/20 1400 03/21/20 1645  BP:  120/68 (!) 129/108 101/65  Pulse:  60 65 65  Resp:  19 20 15   Temp:  98.8 F (37.1 C)    TempSrc:  Oral    SpO2:  97% 99% 95%  Weight: 70 kg     Height: 5\' 10"  (1.778 m)        . General:  Appears uncomfortable, crying out with any movement - but repeatedly forgetting that he has a fracture . Eyes:  PERRL, EOMI, normal lids, iris . ENT:  grossly normal hearing, lips & tongue, mmm . Neck:  no LAD, masses or thyromegaly . Cardiovascular:  RRR,  no m/r/g. No LE edema.  [name] Respiratory:   CTA bilaterally with no wheezes/rales/rhonchi.  Normal respiratory effort. . Abdomen:  soft, NT, ND, NABS . Skin:  no rash or induration seen  on limited exam . Musculoskeletal:  RLE with shortening, external rotation . Psychiatric:  grossly normal mood and affect when not in pain, speech fluent and appropriate, AOx1 . Neurologic:  CN 2-12 grossly intact, moves all extremities in coordinated fashion other than RLE    Radiological Exams on Admission: DG Chest 1 View  Result Date: 03/21/2020 CLINICAL DATA:  [place] and fractured right hip. EXAM: CHEST  1 VIEW COMPARISON:  Chest x-ray 12/25/2018 FINDINGS: The right atrial and right ventricular pacer wires are in good position, unchanged. The heart is normal in size. Stable tortuosity and calcification of the thoracic aorta. Stable surgical changes from bypass surgery. Chronic bronchitic type interstitial lung changes but no acute pulmonary findings. No pleural effusion or pneumothorax. The bony thorax is grossly intact. IMPRESSION: Chronic lung changes but no acute pulmonary findings. Electronically Signed   By: Marijo Sanes M.D.   On: 03/21/2020 12:54   CT HEAD WO CONTRAST  Result Date: 03/21/2020 CLINICAL DATA:  Pain following fall EXAM: CT HEAD WITHOUT CONTRAST CT CERVICAL SPINE WITHOUT CONTRAST TECHNIQUE: Multidetector CT imaging of the head and cervical spine was performed following the standard protocol without intravenous contrast. Multiplanar CT image reconstructions of the cervical spine were also generated. COMPARISON:  CT head; CT cervical spine June 05, 2018 FINDINGS: CT HEAD FINDINGS Brain: Moderate diffuse atrophy is stable. There is no intracranial mass, hemorrhage, extra-axial fluid collection, or midline shift. There is patchy small vessel disease in the centra semiovale bilaterally. No acute infarct is appreciable. Vascular: No hyperdense vessel. There is calcification in each carotid siphon  region. Skull: Bony calvarium appears intact. Sinuses/Orbits: There is mucosal thickening in several ethmoid air cells. Other paranasal sinuses are clear. Orbits appear symmetric bilaterally. Other: Mastoid air cells are clear. CT CERVICAL SPINE FINDINGS Alignment: There is 3 mm of anterolisthesis of C7 on T1, stable. There is no new spondylolisthesis. Skull base and vertebrae: There is pannus posterior to the odontoid without impression on the craniocervical junction. There is extensive erosion in the odontoid, stable from prior study. Bones are osteoporotic. No acute fracture is appreciable. No blastic or lytic bone lesions are evident. Soft tissues and spinal canal: Prevertebral soft tissues and predental space regions are normal. There is no appreciable cord or canal hematoma. No paraspinous lesions. Disc levels: There is severe disc space narrowing at C4-5, C5-6, and C7-T1. There is ankylosis at C6-7 and T1-2, stable. There is facet osteoarthritic change to varying degrees at all levels. Exit foraminal narrowing is noted at C3-4 bilaterally CT and at C5-6 bilaterally with impression on exiting nerve roots. A lesser degree of arthropathy is noted at other levels. No disc extrusion or high-grade stenosis. Note that there is moderate broad-based disc bulging at C5-6 and C6-7. Upper chest: There is emphysematous change in the upper lobes. No edema or consolidation noted in the upper lobe regions. Other: There are foci of subclavian and carotid artery calcification bilaterally. IMPRESSION: CT head: Stable atrophy with periventricular small vessel disease. No mass or hemorrhage. No extra-axial fluid collection. No acute infarct. There are foci of arterial vascular calcification. There is mucosal thickening in several ethmoid air cells. CT cervical spine: Diffuse osteoporosis. No fracture. Stable 3 mm of anterolisthesis of C7 on T1. No new spondylolisthesis. Multilevel arthropathy, essentially stable. Impression on  exiting nerve roots, most severe at C3-4 and C5-6 bilaterally. No frank disc extrusion or high-grade stenosis. Underlying emphysematous change. Foci of subclavian and carotid artery calcification noted bilaterally. Electronically Signed   By:  Lowella Grip III M.D.   On: 03/21/2020 13:29   CT CERVICAL SPINE WO CONTRAST  Result Date: 03/21/2020 CLINICAL DATA:  Pain following fall EXAM: CT HEAD WITHOUT CONTRAST CT CERVICAL SPINE WITHOUT CONTRAST TECHNIQUE: Multidetector CT imaging of the head and cervical spine was performed following the standard protocol without intravenous contrast. Multiplanar CT image reconstructions of the cervical spine were also generated. COMPARISON:  CT head; CT cervical spine June 05, 2018 FINDINGS: CT HEAD FINDINGS Brain: Moderate diffuse atrophy is stable. There is no intracranial mass, hemorrhage, extra-axial fluid collection, or midline shift. There is patchy small vessel disease in the centra semiovale bilaterally. No acute infarct is appreciable. Vascular: No hyperdense vessel. There is calcification in each carotid siphon region. Skull: Bony calvarium appears intact. Sinuses/Orbits: There is mucosal thickening in several ethmoid air cells. Other paranasal sinuses are clear. Orbits appear symmetric bilaterally. Other: Mastoid air cells are clear. CT CERVICAL SPINE FINDINGS Alignment: There is 3 mm of anterolisthesis of C7 on T1, stable. There is no new spondylolisthesis. Skull base and vertebrae: There is pannus posterior to the odontoid without impression on the craniocervical junction. There is extensive erosion in the odontoid, stable from prior study. Bones are osteoporotic. No acute fracture is appreciable. No blastic or lytic bone lesions are evident. Soft tissues and spinal canal: Prevertebral soft tissues and predental space regions are normal. There is no appreciable cord or canal hematoma. No paraspinous lesions. Disc levels: There is severe disc space narrowing  at C4-5, C5-6, and C7-T1. There is ankylosis at C6-7 and T1-2, stable. There is facet osteoarthritic change to varying degrees at all levels. Exit foraminal narrowing is noted at C3-4 bilaterally CT and at C5-6 bilaterally with impression on exiting nerve roots. A lesser degree of arthropathy is noted at other levels. No disc extrusion or high-grade stenosis. Note that there is moderate broad-based disc bulging at C5-6 and C6-7. Upper chest: There is emphysematous change in the upper lobes. No edema or consolidation noted in the upper lobe regions. Other: There are foci of subclavian and carotid artery calcification bilaterally. IMPRESSION: CT head: Stable atrophy with periventricular small vessel disease. No mass or hemorrhage. No extra-axial fluid collection. No acute infarct. There are foci of arterial vascular calcification. There is mucosal thickening in several ethmoid air cells. CT cervical spine: Diffuse osteoporosis. No fracture. Stable 3 mm of anterolisthesis of C7 on T1. No new spondylolisthesis. Multilevel arthropathy, essentially stable. Impression on exiting nerve roots, most severe at C3-4 and C5-6 bilaterally. No frank disc extrusion or high-grade stenosis. Underlying emphysematous change. Foci of subclavian and carotid artery calcification noted bilaterally. Electronically Signed   By: Lowella Grip III M.D.   On: 03/21/2020 13:29   DG Hip Unilat With Pelvis 2-3 Views Right  Result Date: 03/21/2020 CLINICAL DATA:  Fall, right hip pain EXAM: DG HIP (WITH OR WITHOUT PELVIS) 2-3V RIGHT COMPARISON:  12/25/2018 FINDINGS: Right femoral intertrochanteric fracture noted. Minimal displacement. No significant angulation. No no subluxation or dislocation. Hardware seen within the left proximal femur. IMPRESSION: Right femoral intertrochanteric fracture. Electronically Signed   By: Rolm Baptise M.D.   On: 03/21/2020 12:53    EKG: Independently reviewed.  Paced rhythm with rate 60; LBBB with no  evidence of acute ischemia   Labs on Admission: I have personally reviewed the available labs and imaging studies at the time of the admission.  Pertinent labs:   BUN 28/Creatinine 1.56/GFR 38 WBC 6.3 Hgb 11.5 INR 1.2   Assessment/Plan Principal Problem:  Hip fracture (HCC) Active Problems:   Permanent atrial fibrillation (HCC)   Coronary artery disease involving native coronary artery of native heart without angina pectoris   Alzheimer's disease ([place])   Hypercholesterolemia   AAA (abdominal aortic aneurysm) without rupture (HCC)   HTN (hypertension)   DNR (do not resuscitate)   R intertrochanteric hip fracture -Mechanical fall resulting in hip fracture -Orthopedics consult -NPO after midnight in anticipation of surgical repair tomorrow -SCDs overnight, start Lovenox post-operatively (or as per ortho) -Pain control with Robaxin and Morphine PCA - given the severity of his discomfort and his inability to remember not to move his leg, this seems most appropriate in conjunction with breakthrough Dilaudid as needed (recommended by pharmacy) -SW consult for rehab placement - previously went to [facility] -Will need PT consult post-operatively -Hip fracture order set utilized  Afib -Rate controlled without medication, has pacer -Anticoagulation was stopped due to falls and GI bleeding -Continue 81 mg ASA daily  Dementia -Fairly advanced dementia -Cannot remember not to move his leg -Recommend operative repair as soon as possible -Continue Aricept, Zoloft  HTN -He does not appear to be taking medications for this issue  HLD -Continue Lipitor  CAD/AAA -Continue ASA -Not a candidate for surgical or device repair  DNR -I have discussed code status with the patient's wife and the patient would not desire resuscitation and would prefer to die a natural death should that situation arise. -He will need a gold out of facility DNR form at the time of discharge   Note:  This patient has been tested and is negative for the novel coronavirus COVID-19.    DVT prophylaxis:  SCDs until approved for Lovenox by orthopedics Code Status:  DNR - confirmed with family Family Communication: Wife was present throughout evalaution Disposition Plan:  Home once clinically improved Consults called: Orthopedics; SW, Nutrition; will need PT post-operatively  Admission status: Admit - It is my clinical opinion that admission to INPATIENT is reasonable and necessary because of the expectation that this patient will require hospital care that crosses at least 2 midnights to treat this condition based on the medical complexity of the problems presented.  Given the aforementioned information, the predictability of an adverse outcome is felt to be significant.    Karmen Bongo MD [facility]   How to contact the [facility] Attending or Consulting provider [place] or covering provider during after hours [place], for this patient?  1. Check the care team in [facility] and look for a) attending/consulting [facility] provider listed and b) the [facility] team listed 2. Log into [email or web address] and use [place]'s universal password to access. If you do not have the password, please contact the hospital operator. 3. Locate the [facility] provider you are looking for under [facility] and page to a number that you can be directly reached. 4. If you still have difficulty reaching the provider, please page the [facility] (Director on Call) for the Hospitalists listed on amion for assistance.   03/21/2020, 5:13 PM

## 2020-03-21 NOTE — Telephone Encounter (Signed)
I saw that he has a right hip fracture and Dr. Sharol Given is going to fix it tomorrow.

## 2020-03-21 NOTE — ED Provider Notes (Signed)
[facility] EMERGENCY DEPARTMENT Provider Note   CSN: [id number] Arrival date & time: 03/21/20  1154     History Chief Complaint  Patient presents with  . Fall    [name] [name] is a 86 y.o. male presenting for evaluation of right hip pain after fall.  Level 5 caveat due to dementia.  History provided by patient's wife.  She states he stood up from the bathroom and fell, landing on his right side.  He did hit his head, but did not lose consciousness.  Patient reported right hip and had pain to her.  He is not on blood thinners.  He has not been able to ambulate since.  He has not had anything for pain.  Patient reports no pain.  He does not remember falling or hitting his head.  Additional history obtained from chart review.  Patient with a history of A. fib not on anticoagulation (due to previous GI bleed), dementia, CAD, hypertension, hyperlipidemia.  He follows with Dr. Erlinda Hong from [place], had repair of L hip fx last year.   HPI     Past Medical History:  Diagnosis Date  . Atrial fibrillation, permanent ([place])   . CAD (coronary artery disease)   . Dementia ([place])   . Diverticulosis    diverticular bleed 2011  . High cholesterol   . HTN (hypertension)    has improved over time, no rx as of 2019  . Lower GI bleed     Patient Active Problem List   Diagnosis Date Noted  . Urinary urgency 01/01/2019  . Candidiasis 01/01/2019  . Closed displaced intertrochanteric fracture of left femur ([place]) 12/25/2018  . Closed intertrochanteric fracture of hip, left, initial encounter ([place]) 12/25/2018  . Pain due to onychomycosis of toenails of both feet 12/12/2018  . Acute blood loss anemia 06/08/2018  . GI bleed 06/05/2018  . HTN (hypertension)   . High cholesterol   . Diverticulosis   . CAD (coronary artery disease)   . Lower GI bleed   . AAA (abdominal aortic aneurysm) without rupture ([place]) 01/09/2018  . Ascending aortic aneurysm ([place]) 01/09/2018  . Heart  block AV second degree 07/15/2017  . Status post three vessel coronary artery bypass 07/15/2017  . Left ventricular systolic dysfunction without heart failure 05/11/2017  . Hypercholesterolemia 05/11/2017  . CHB (complete heart block) ([place]) 06/07/2015  . Alzheimer's disease ([place]) 01/26/2015  . Pacemaker 01/26/2013  . Permanent atrial fibrillation ([place]) 01/26/2013  . Second degree AV block, Mobitz type II 01/26/2013  . Coronary artery disease involving native coronary artery of native heart without angina pectoris 01/26/2013  . Dyspnea 01/26/2013    Past Surgical History:  Procedure Laterality Date  . angiolplasty    . APPENDECTOMY    . INSERT / REPLACE / REMOVE PACEMAKER    . INTRAMEDULLARY (IM) NAIL INTERTROCHANTERIC Left 12/26/2018   Procedure: INTRAMEDULLARY (IM) NAIL INTERTROCHANTRIC;  Surgeon: Leandrew Koyanagi, MD;  Location: [facility];  Service: Orthopedics;  Laterality: Left;  . KNEE SURGERY    . PILONIDAL CYST EXCISION    . rotator cuff surgery  1985  . TONSILLECTOMY    . triple heart bypass         Family History  Problem Relation Age of Onset  . Stroke Father   . Cervical cancer Other        siblings  . COPD Sister   . Dementia Brother     Social History   Tobacco Use  . Smoking  status: Former Smoker    Quit date: 07/02/1974    Years since quitting: 45.7  . Smokeless tobacco: Never Used  Vaping Use  . Vaping Use: Never used  Substance Use Topics  . Alcohol use: Not Currently    Comment: last used in dec 2019  . Drug use: No    Home Medications Prior to Admission medications   Medication Sig Start Date End Date Taking? Authorizing Provider  aspirin EC 81 MG tablet Take 81 mg by mouth daily.    [provider]  atorvastatin (LIPITOR) 40 MG tablet Take 40 mg by mouth every evening.     [provider]  Cholecalciferol (VITAMIN D) 2000 UNITS CAPS Take 2,000 Units by mouth at bedtime.     [provider]  donepezil (ARICEPT) 5 MG tablet  Take 1 tablet (5 mg total) by mouth 2 (two) times daily. 03/25/19   Lomax, Amy, NP  ferrous sulfate 325 (65 FE) MG EC tablet Take 325 mg by mouth 3 (three) times a week. OTC    [provider]  finasteride (PROSCAR) 5 MG tablet Take 5 mg by mouth every evening.     [provider]  Multiple Vitamin (MULTIVITAMIN WITH MINERALS) TABS tablet Take 1 tablet by mouth daily. Multivitamin shakes.    [provider]  mupirocin ointment (BACTROBAN) 2 % Apply topically 2 (two) times daily. 11/04/19   [provider]  sertraline (ZOLOFT) 25 MG tablet Take 25 mg by mouth daily. 07/07/18   [provider]    Allergies    Amiodarone and Antihistamines, diphenhydramine-type  Review of Systems   Review of Systems  Unable to perform ROS: Dementia  Musculoskeletal: Positive for arthralgias.  Neurological: Positive for headaches (per wife).    Physical Exam Updated Vital Signs BP 120/68 (BP Location: Right Arm)   Pulse 60   Temp 98.8 F (37.1 C) (Oral)   Resp 19   Ht 5\' 10"  (1.778 m)   Wt 70 kg   SpO2 97%   BMI 22.14 kg/m   Physical Exam Vitals and nursing note reviewed.  Constitutional:      General: He is not in acute distress.    Appearance: He is underweight.     Comments: Elderly, chronically ill-appearing male.  Underweight.  HENT:     Head: Normocephalic and atraumatic.  Eyes:     Extraocular Movements: Extraocular movements intact.     Conjunctiva/sclera: Conjunctivae normal.     Pupils: Pupils are equal, round, and reactive to light.  Neck:     Comments: In c-collar Cardiovascular:     Rate and Rhythm: Normal rate.     Pulses: Normal pulses.  Pulmonary:     Effort: Pulmonary effort is normal. No respiratory distress.     Breath sounds: Normal breath sounds. No wheezing.  Abdominal:     General: There is no distension.     Palpations: Abdomen is soft. There is no mass.     Tenderness: There is no abdominal tenderness. There is no  guarding or rebound.     Comments: No ttp  Musculoskeletal:        General: Tenderness present.     Cervical back: Normal range of motion.     Comments: Tenderness palpation of right hip.  Right leg shortened and externally rotated.  Pedal pulses 2+ bilaterally.  Good distal distal sensation and cap refill.  Skin:    General: Skin is warm and dry.     Capillary  Refill: Capillary refill takes less than 2 seconds.  Neurological:     Mental Status: He is alert.     Comments: oriented to person only. Baseline per wife     ED Results / Procedures / Treatments   Labs (all labs ordered are listed, but only abnormal results are displayed) Labs Reviewed  BASIC METABOLIC PANEL - Abnormal; Notable for the following components:      Result Value   BUN 28 (*)    Creatinine, Ser 1.56 (*)    GFR calc non Af Amer 38 (*)    GFR calc Af Amer 44 (*)    All other components within normal limits  CBC WITH DIFFERENTIAL/PLATELET - Abnormal; Notable for the following components:   RBC 3.67 (*)    Hemoglobin 11.5 (*)    HCT 37.4 (*)    MCV 101.9 (*)    All other components within normal limits  SARS CORONAVIRUS 2 BY RT PCR (HOSPITAL ORDER, [facility])  PROTIME-INR  URINALYSIS, ROUTINE W REFLEX MICROSCOPIC  TYPE AND SCREEN    EKG EKG Interpretation  Date/Time:  Monday March 21 2020 12:06:10 EDT Ventricular Rate:  60 PR Interval:    QRS Duration: 188 QT Interval:  480 QTC Calculation: 480 R Axis:   -64 Text Interpretation: paced Left bundle branch block Confirmed by Charlesetta Shanks [phone]) on 03/21/2020 1:17:06 PM   Radiology DG Chest 1 View  Result Date: 03/21/2020 CLINICAL DATA:  [place] and fractured right hip. EXAM: CHEST  1 VIEW COMPARISON:  Chest x-ray 12/25/2018 FINDINGS: The right atrial and right ventricular pacer wires are in good position, unchanged. The heart is normal in size. Stable tortuosity and calcification of the thoracic aorta. Stable surgical  changes from bypass surgery. Chronic bronchitic type interstitial lung changes but no acute pulmonary findings. No pleural effusion or pneumothorax. The bony thorax is grossly intact. IMPRESSION: Chronic lung changes but no acute pulmonary findings. Electronically Signed   By: Marijo Sanes M.D.   On: 03/21/2020 12:54   CT HEAD WO CONTRAST  Result Date: 03/21/2020 CLINICAL DATA:  Pain following fall EXAM: CT HEAD WITHOUT CONTRAST CT CERVICAL SPINE WITHOUT CONTRAST TECHNIQUE: Multidetector CT imaging of the head and cervical spine was performed following the standard protocol without intravenous contrast. Multiplanar CT image reconstructions of the cervical spine were also generated. COMPARISON:  CT head; CT cervical spine June 05, 2018 FINDINGS: CT HEAD FINDINGS Brain: Moderate diffuse atrophy is stable. There is no intracranial mass, hemorrhage, extra-axial fluid collection, or midline shift. There is patchy small vessel disease in the centra semiovale bilaterally. No acute infarct is appreciable. Vascular: No hyperdense vessel. There is calcification in each carotid siphon region. Skull: Bony calvarium appears intact. Sinuses/Orbits: There is mucosal thickening in several ethmoid air cells. Other paranasal sinuses are clear. Orbits appear symmetric bilaterally. Other: Mastoid air cells are clear. CT CERVICAL SPINE FINDINGS Alignment: There is 3 mm of anterolisthesis of C7 on T1, stable. There is no new spondylolisthesis. Skull base and vertebrae: There is pannus posterior to the odontoid without impression on the craniocervical junction. There is extensive erosion in the odontoid, stable from prior study. Bones are osteoporotic. No acute fracture is appreciable. No blastic or lytic bone lesions are evident. Soft tissues and spinal canal: Prevertebral soft tissues and predental space regions are normal. There is no appreciable cord or canal hematoma. No paraspinous lesions. Disc levels: There is severe disc  space narrowing at C4-5, C5-6, and C7-T1. There  is ankylosis at C6-7 and T1-2, stable. There is facet osteoarthritic change to varying degrees at all levels. Exit foraminal narrowing is noted at C3-4 bilaterally CT and at C5-6 bilaterally with impression on exiting nerve roots. A lesser degree of arthropathy is noted at other levels. No disc extrusion or high-grade stenosis. Note that there is moderate broad-based disc bulging at C5-6 and C6-7. Upper chest: There is emphysematous change in the upper lobes. No edema or consolidation noted in the upper lobe regions. Other: There are foci of subclavian and carotid artery calcification bilaterally. IMPRESSION: CT head: Stable atrophy with periventricular small vessel disease. No mass or hemorrhage. No extra-axial fluid collection. No acute infarct. There are foci of arterial vascular calcification. There is mucosal thickening in several ethmoid air cells. CT cervical spine: Diffuse osteoporosis. No fracture. Stable 3 mm of anterolisthesis of C7 on T1. No new spondylolisthesis. Multilevel arthropathy, essentially stable. Impression on exiting nerve roots, most severe at C3-4 and C5-6 bilaterally. No frank disc extrusion or high-grade stenosis. Underlying emphysematous change. Foci of subclavian and carotid artery calcification noted bilaterally. Electronically Signed   By: Lowella Grip III M.D.   On: 03/21/2020 13:29   CT CERVICAL SPINE WO CONTRAST  Result Date: 03/21/2020 CLINICAL DATA:  Pain following fall EXAM: CT HEAD WITHOUT CONTRAST CT CERVICAL SPINE WITHOUT CONTRAST TECHNIQUE: Multidetector CT imaging of the head and cervical spine was performed following the standard protocol without intravenous contrast. Multiplanar CT image reconstructions of the cervical spine were also generated. COMPARISON:  CT head; CT cervical spine June 05, 2018 FINDINGS: CT HEAD FINDINGS Brain: Moderate diffuse atrophy is stable. There is no intracranial mass, hemorrhage,  extra-axial fluid collection, or midline shift. There is patchy small vessel disease in the centra semiovale bilaterally. No acute infarct is appreciable. Vascular: No hyperdense vessel. There is calcification in each carotid siphon region. Skull: Bony calvarium appears intact. Sinuses/Orbits: There is mucosal thickening in several ethmoid air cells. Other paranasal sinuses are clear. Orbits appear symmetric bilaterally. Other: Mastoid air cells are clear. CT CERVICAL SPINE FINDINGS Alignment: There is 3 mm of anterolisthesis of C7 on T1, stable. There is no new spondylolisthesis. Skull base and vertebrae: There is pannus posterior to the odontoid without impression on the craniocervical junction. There is extensive erosion in the odontoid, stable from prior study. Bones are osteoporotic. No acute fracture is appreciable. No blastic or lytic bone lesions are evident. Soft tissues and spinal canal: Prevertebral soft tissues and predental space regions are normal. There is no appreciable cord or canal hematoma. No paraspinous lesions. Disc levels: There is severe disc space narrowing at C4-5, C5-6, and C7-T1. There is ankylosis at C6-7 and T1-2, stable. There is facet osteoarthritic change to varying degrees at all levels. Exit foraminal narrowing is noted at C3-4 bilaterally CT and at C5-6 bilaterally with impression on exiting nerve roots. A lesser degree of arthropathy is noted at other levels. No disc extrusion or high-grade stenosis. Note that there is moderate broad-based disc bulging at C5-6 and C6-7. Upper chest: There is emphysematous change in the upper lobes. No edema or consolidation noted in the upper lobe regions. Other: There are foci of subclavian and carotid artery calcification bilaterally. IMPRESSION: CT head: Stable atrophy with periventricular small vessel disease. No mass or hemorrhage. No extra-axial fluid collection. No acute infarct. There are foci of arterial vascular calcification. There is  mucosal thickening in several ethmoid air cells. CT cervical spine: Diffuse osteoporosis. No fracture. Stable 3 mm of anterolisthesis  of C7 on T1. No new spondylolisthesis. Multilevel arthropathy, essentially stable. Impression on exiting nerve roots, most severe at C3-4 and C5-6 bilaterally. No frank disc extrusion or high-grade stenosis. Underlying emphysematous change. Foci of subclavian and carotid artery calcification noted bilaterally. Electronically Signed   By: Lowella Grip III M.D.   On: 03/21/2020 13:29   DG Hip Unilat With Pelvis 2-3 Views Right  Result Date: 03/21/2020 CLINICAL DATA:  Fall, right hip pain EXAM: DG HIP (WITH OR WITHOUT PELVIS) 2-3V RIGHT COMPARISON:  12/25/2018 FINDINGS: Right femoral intertrochanteric fracture noted. Minimal displacement. No significant angulation. No no subluxation or dislocation. Hardware seen within the left proximal femur. IMPRESSION: Right femoral intertrochanteric fracture. Electronically Signed   By: Rolm Baptise M.D.   On: 03/21/2020 12:53    Procedures Procedures (including critical care time)  Medications Ordered in ED Medications  fentaNYL (SUBLIMAZE) injection 50 mcg (has no administration in time range)  sodium chloride 0.9 % bolus 500 mL (has no administration in time range)    ED Course  I have reviewed the triage vital signs and the nursing notes.  Pertinent labs & imaging results that were available during my care of the patient were reviewed by me and considered in my medical decision making (see chart for details).    MDM Rules/Calculators/A&P                          Patient presenting for evaluation of right hip pain and reported headache after fall.  On exam, patient appears neurovascularly intact.  He is at his baseline mental status.  He does have tenderness of the right hip with shortening and external rotation of the leg, concern for right hip fracture.  Will obtain x-ray of the chest and pelvis.  Will obtain CT of  the head and neck due to patient's history of dementia and inability to say where he hurts.  Will obtain basic labs and Covid test.  X-rays viewed interpreted by me, shows intertrochanteric hip fracture on the right.  No fracture or signs of injury of the chest.  CT head and neck negative for acute findings.  Labs overall reassuring.  No leukocytosis.  Mild elevation in creatinine from baseline, wife reports patient has not been drinking as much as normal.  Will give a small, 500 cc, fluid bolus.  Will consult with orthopedics.  Discussed with Dr. Sharol Given from Ortho care who will evaluate the patient.  Requests admission to medicine.  Discussed with Dr. Lorin Mercy from [facility], pt to be admitted.   Final Clinical Impression(s) / ED Diagnoses Final diagnoses:  Closed fracture of right hip, initial encounter [facility])  Fall, initial encounter  Dehydration    Rx / DC Orders ED Discharge Orders    None       Franchot Heidelberg, PA-C 03/21/20 1423    Charlesetta Shanks, MD 03/22/20 1447

## 2020-03-21 NOTE — Telephone Encounter (Signed)
Wife called and states patient fell down today. States she will take patient to ED. She will have to call Ambulance since he is unable to get up. States his leg/hip hurts. Advised them you were in OR all day and will let you know that he was going to ED. She would like for you to be on the look out when they do Xrays. Would like to know if he needs to come in this week for follow up?  You did SU 12/26/2018-IM Nail Intertrochantric (left)

## 2020-03-21 NOTE — Consult Note (Signed)
ORTHOPAEDIC CONSULTATION  REQUESTING PHYSICIAN: Karmen Bongo, MD  Chief Complaint:   HPI: [name] [name] is a 86 y.o. male who presents with acute fall today,  he states he is not aware that he fell states he remembers breaking his other hip in the past but does not remember his acute injury today and sustaining an intertrochanteric hip fracture.  Patient has a history of dementia.  Patient states that he lives with his wife and children at home.  Past Medical History:  Diagnosis Date  . Atrial fibrillation, permanent ([place])    no longer on AC due to GI bleed  . CAD (coronary artery disease)   . Dementia ([place])   . Diverticulosis    diverticular bleed 2011  . High cholesterol   . HTN (hypertension)    has improved over time, no rx as of 2019  . Lower GI bleed    Past Surgical History:  Procedure Laterality Date  . angiolplasty    . APPENDECTOMY    . INSERT / REPLACE / REMOVE PACEMAKER    . INTRAMEDULLARY (IM) NAIL INTERTROCHANTERIC Left 12/26/2018   Procedure: INTRAMEDULLARY (IM) NAIL INTERTROCHANTRIC;  Surgeon: Leandrew Koyanagi, MD;  Location: [facility];  Service: Orthopedics;  Laterality: Left;  . KNEE SURGERY    . PILONIDAL CYST EXCISION    . rotator cuff surgery  1985  . TONSILLECTOMY    . triple heart bypass     Social History   Socioeconomic History  . Marital status: Married    Spouse name: [name]  . Number of children: 6  . Years of education: college  . Highest education level: Not on file  Occupational History  . Occupation: retired  Tobacco Use  . Smoking status: Former Smoker    Quit date: 07/02/1974    Years since quitting: 45.7  . Smokeless tobacco: Never Used  Vaping Use  . Vaping Use: Never used  Substance and Sexual Activity  . Alcohol use: Not Currently    Comment: last used in dec 2019  . Drug use: No  . Sexual activity: Not on file  Other Topics Concern  . Not on file  Social History Narrative   Patient is married [name]) and lives  at home with his wife.   Patient has a college education.   Patient is a retired Materials engineer.   Patient does not drink any caffeine.   Patient has six children.   Social Determinants of Health   Financial Resource Strain:   . Difficulty of Paying Living Expenses: Not on file  Food Insecurity:   . Worried About Charity fundraiser in the Last Year: Not on file  . Ran Out of Food in the Last Year: Not on file  Transportation Needs:   . Lack of Transportation (Medical): Not on file  . Lack of Transportation (Non-Medical): Not on file  Physical Activity:   . Days of Exercise per Week: Not on file  . Minutes of Exercise per Session: Not on file  Stress:   . Feeling of Stress : Not on file  Social Connections:   . Frequency of Communication with Friends and Family: Not on file  . Frequency of Social Gatherings with Friends and Family: Not on file  . Attends Religious Services: Not on file  . Active Member of Clubs or Organizations: Not on file  . Attends Archivist Meetings: Not on file  . Marital Status: Not on file  Family History  Problem Relation Age of Onset  . Stroke Father   . Cervical cancer Other        siblings  . COPD Sister   . Dementia Brother    - negative except otherwise stated in the family history section Allergies  Allergen Reactions  . Amiodarone Other (See Comments)    Had a severe lung infection in 2003  . Antihistamines, Diphenhydramine-Type Other (See Comments)    Jittery   Prior to Admission medications   Medication Sig Start Date End Date Taking? Authorizing Provider  aspirin EC 81 MG tablet Take 81 mg by mouth daily.   Yes [provider]  atorvastatin (LIPITOR) 40 MG tablet Take 40 mg by mouth every evening.    Yes [provider]  Cholecalciferol (VITAMIN D) 2000 UNITS CAPS Take 2,000 Units by mouth at bedtime.    Yes [provider]  donepezil (ARICEPT) 5 MG tablet Take 1 tablet (5 mg total)  by mouth 2 (two) times daily. 03/25/19  Yes Lomax, Amy, NP  ferrous sulfate 325 (65 FE) MG EC tablet Take 325 mg by mouth 3 (three) times a week. Monday, Wednesday and Friday morning.   Yes [provider]  finasteride (PROSCAR) 5 MG tablet Take 5 mg by mouth every evening.    Yes [provider]  Multiple Vitamin (MULTIVITAMIN WITH MINERALS) TABS tablet Take 1 tablet by mouth daily.    Yes [provider]  sertraline (ZOLOFT) 25 MG tablet Take 25 mg by mouth daily. 07/07/18  Yes [provider]   DG Chest 1 View  Result Date: 03/21/2020 CLINICAL DATA:  [place] and fractured right hip. EXAM: CHEST  1 VIEW COMPARISON:  Chest x-ray 12/25/2018 FINDINGS: The right atrial and right ventricular pacer wires are in good position, unchanged. The heart is normal in size. Stable tortuosity and calcification of the thoracic aorta. Stable surgical changes from bypass surgery. Chronic bronchitic type interstitial lung changes but no acute pulmonary findings. No pleural effusion or pneumothorax. The bony thorax is grossly intact. IMPRESSION: Chronic lung changes but no acute pulmonary findings. Electronically Signed   By: Marijo Sanes M.D.   On: 03/21/2020 12:54   CT HEAD WO CONTRAST  Result Date: 03/21/2020 CLINICAL DATA:  Pain following fall EXAM: CT HEAD WITHOUT CONTRAST CT CERVICAL SPINE WITHOUT CONTRAST TECHNIQUE: Multidetector CT imaging of the head and cervical spine was performed following the standard protocol without intravenous contrast. Multiplanar CT image reconstructions of the cervical spine were also generated. COMPARISON:  CT head; CT cervical spine June 05, 2018 FINDINGS: CT HEAD FINDINGS Brain: Moderate diffuse atrophy is stable. There is no intracranial mass, hemorrhage, extra-axial fluid collection, or midline shift. There is patchy small vessel disease in the centra semiovale bilaterally. No acute infarct is appreciable. Vascular: No hyperdense vessel. There is  calcification in each carotid siphon region. Skull: Bony calvarium appears intact. Sinuses/Orbits: There is mucosal thickening in several ethmoid air cells. Other paranasal sinuses are clear. Orbits appear symmetric bilaterally. Other: Mastoid air cells are clear. CT CERVICAL SPINE FINDINGS Alignment: There is 3 mm of anterolisthesis of C7 on T1, stable. There is no new spondylolisthesis. Skull base and vertebrae: There is pannus posterior to the odontoid without impression on the craniocervical junction. There is extensive erosion in the odontoid, stable from prior study. Bones are osteoporotic. No acute fracture is appreciable. No blastic or lytic bone lesions are evident. Soft tissues and spinal canal: Prevertebral soft tissues and predental space regions  are normal. There is no appreciable cord or canal hematoma. No paraspinous lesions. Disc levels: There is severe disc space narrowing at C4-5, C5-6, and C7-T1. There is ankylosis at C6-7 and T1-2, stable. There is facet osteoarthritic change to varying degrees at all levels. Exit foraminal narrowing is noted at C3-4 bilaterally CT and at C5-6 bilaterally with impression on exiting nerve roots. A lesser degree of arthropathy is noted at other levels. No disc extrusion or high-grade stenosis. Note that there is moderate broad-based disc bulging at C5-6 and C6-7. Upper chest: There is emphysematous change in the upper lobes. No edema or consolidation noted in the upper lobe regions. Other: There are foci of subclavian and carotid artery calcification bilaterally. IMPRESSION: CT head: Stable atrophy with periventricular small vessel disease. No mass or hemorrhage. No extra-axial fluid collection. No acute infarct. There are foci of arterial vascular calcification. There is mucosal thickening in several ethmoid air cells. CT cervical spine: Diffuse osteoporosis. No fracture. Stable 3 mm of anterolisthesis of C7 on T1. No new spondylolisthesis. Multilevel arthropathy,  essentially stable. Impression on exiting nerve roots, most severe at C3-4 and C5-6 bilaterally. No frank disc extrusion or high-grade stenosis. Underlying emphysematous change. Foci of subclavian and carotid artery calcification noted bilaterally. Electronically Signed   By: Lowella Grip III M.D.   On: 03/21/2020 13:29   CT CERVICAL SPINE WO CONTRAST  Result Date: 03/21/2020 CLINICAL DATA:  Pain following fall EXAM: CT HEAD WITHOUT CONTRAST CT CERVICAL SPINE WITHOUT CONTRAST TECHNIQUE: Multidetector CT imaging of the head and cervical spine was performed following the standard protocol without intravenous contrast. Multiplanar CT image reconstructions of the cervical spine were also generated. COMPARISON:  CT head; CT cervical spine June 05, 2018 FINDINGS: CT HEAD FINDINGS Brain: Moderate diffuse atrophy is stable. There is no intracranial mass, hemorrhage, extra-axial fluid collection, or midline shift. There is patchy small vessel disease in the centra semiovale bilaterally. No acute infarct is appreciable. Vascular: No hyperdense vessel. There is calcification in each carotid siphon region. Skull: Bony calvarium appears intact. Sinuses/Orbits: There is mucosal thickening in several ethmoid air cells. Other paranasal sinuses are clear. Orbits appear symmetric bilaterally. Other: Mastoid air cells are clear. CT CERVICAL SPINE FINDINGS Alignment: There is 3 mm of anterolisthesis of C7 on T1, stable. There is no new spondylolisthesis. Skull base and vertebrae: There is pannus posterior to the odontoid without impression on the craniocervical junction. There is extensive erosion in the odontoid, stable from prior study. Bones are osteoporotic. No acute fracture is appreciable. No blastic or lytic bone lesions are evident. Soft tissues and spinal canal: Prevertebral soft tissues and predental space regions are normal. There is no appreciable cord or canal hematoma. No paraspinous lesions. Disc levels:  There is severe disc space narrowing at C4-5, C5-6, and C7-T1. There is ankylosis at C6-7 and T1-2, stable. There is facet osteoarthritic change to varying degrees at all levels. Exit foraminal narrowing is noted at C3-4 bilaterally CT and at C5-6 bilaterally with impression on exiting nerve roots. A lesser degree of arthropathy is noted at other levels. No disc extrusion or high-grade stenosis. Note that there is moderate broad-based disc bulging at C5-6 and C6-7. Upper chest: There is emphysematous change in the upper lobes. No edema or consolidation noted in the upper lobe regions. Other: There are foci of subclavian and carotid artery calcification bilaterally. IMPRESSION: CT head: Stable atrophy with periventricular small vessel disease. No mass or hemorrhage. No extra-axial fluid collection. No acute infarct. There  are foci of arterial vascular calcification. There is mucosal thickening in several ethmoid air cells. CT cervical spine: Diffuse osteoporosis. No fracture. Stable 3 mm of anterolisthesis of C7 on T1. No new spondylolisthesis. Multilevel arthropathy, essentially stable. Impression on exiting nerve roots, most severe at C3-4 and C5-6 bilaterally. No frank disc extrusion or high-grade stenosis. Underlying emphysematous change. Foci of subclavian and carotid artery calcification noted bilaterally. Electronically Signed   By: Lowella Grip III M.D.   On: 03/21/2020 13:29   DG Hip Unilat With Pelvis 2-3 Views Right  Result Date: 03/21/2020 CLINICAL DATA:  Fall, right hip pain EXAM: DG HIP (WITH OR WITHOUT PELVIS) 2-3V RIGHT COMPARISON:  12/25/2018 FINDINGS: Right femoral intertrochanteric fracture noted. Minimal displacement. No significant angulation. No no subluxation or dislocation. Hardware seen within the left proximal femur. IMPRESSION: Right femoral intertrochanteric fracture. Electronically Signed   By: Rolm Baptise M.D.   On: 03/21/2020 12:53   - pertinent xrays, CT, MRI studies were  reviewed and independently interpreted  Positive ROS: All other systems have been reviewed and were otherwise negative with the exception of those mentioned in the HPI and as above.  Physical Exam: General: Alert, no acute distress Psychiatric: Patient is competent for consent with normal mood and affect Lymphatic: No axillary or cervical lymphadenopathy Cardiovascular: No pedal edema Respiratory: No cyanosis, no use of accessory musculature GI: No organomegaly, abdomen is soft and non-tender    Images:  @ENCIMAGES @  Labs:  Lab Results  Component Value Date   HGBA1C  12/13/2008    5.5 (NOTE) The ADA recommends the following therapeutic goal for glycemic control related to Hgb A1c measurement: Goal of therapy: <6.5 Hgb A1c  Reference: American Diabetes Association: Clinical Practice Recommendations 2010, Diabetes Care, 2010, 33: (Suppl  1).   REPTSTATUS 06/10/2018 FINAL 06/05/2018   CULT  06/05/2018    NO GROWTH 5 DAYS Performed at [facility], [place] [address], [place], [place] [address]     Lab Results  Component Value Date   ALBUMIN 3.1 (L) 12/25/2018   ALBUMIN 2.3 (L) 06/08/2018   ALBUMIN 2.6 (L) 06/05/2018    Neurologic: Patient does not have protective sensation bilateral lower extremities.   MUSCULOSKELETAL:   Skin: Examination of the skin is intact.  The right lower extremity is shortened and externally rotated I cannot feel a good dorsalis pedis or posterior tibial pulse.  Radiographs were reviewed which shows a intertrochanteric hip fracture on the right with significant peripheral vascular disease with calcification of the vessels around the hip.  CT of the head and neck shows no acute changes no hemorrhage.  Assessment: Assessment: Acute intertrochanteric right hip fracture with dementia with previous internal fixation of the left intertrochanteric hip fracture.  Plan: Plan: Will plan for surgical intervention tomorrow.  We will make the  patient n.p.o. after midnight tonight.  Risks and benefits of surgery were discussed.    I called and discussed the patient's hip fracture with his wife, she states she does have power of attorney for medical decisions.  She will be back by tomorrow and will sign his operative permit.  Anticipate surgery tomorrow around 4 to  5 PM.  Thank you for the consult and the opportunity to see Mr. [name] [name], [place] [phone] 5:50 PM

## 2020-03-21 NOTE — Plan of Care (Signed)

## 2020-03-22 ENCOUNTER — Inpatient Hospital Stay (HOSPITAL_COMMUNITY): Payer: Medicare Other | Admitting: Anesthesiology

## 2020-03-22 ENCOUNTER — Inpatient Hospital Stay (HOSPITAL_COMMUNITY): Payer: Medicare Other

## 2020-03-22 ENCOUNTER — Encounter (HOSPITAL_COMMUNITY)
Admission: EM | Disposition: A | Discharge status: Skilled Nursing Facility | Payer: Self-pay | Source: Home / Self Care | Attending: Internal Medicine

## 2020-03-22 ENCOUNTER — Encounter (HOSPITAL_COMMUNITY): Payer: Self-pay | Admitting: Internal Medicine

## 2020-03-22 DIAGNOSIS — Z66 Do not resuscitate: Secondary | ICD-10-CM

## 2020-03-22 HISTORY — PX: INTRAMEDULLARY (IM) NAIL INTERTROCHANTERIC: SHX5875

## 2020-03-22 LAB — CBC
HCT: 32.3 % — ABNORMAL LOW (ref 39.0–52.0)
Hemoglobin: 9.9 g/dL — ABNORMAL LOW (ref 13.0–17.0)
MCH: 30.4 pg (ref 26.0–34.0)
MCHC: 30.7 g/dL (ref 30.0–36.0)
MCV: 99.1 fL (ref 80.0–100.0)
Platelets: 195 10*3/uL (ref 150–400)
RBC: 3.26 MIL/uL — ABNORMAL LOW (ref 4.22–5.81)
RDW: 14.2 % (ref 11.5–15.5)
WBC: 9.8 10*3/uL (ref 4.0–10.5)
nRBC: 0 % (ref 0.0–0.2)

## 2020-03-22 LAB — BASIC METABOLIC PANEL
Anion gap: 10 (ref 5–15)
BUN: 30 mg/dL — ABNORMAL HIGH (ref 8–23)
CO2: 23 mmol/L (ref 22–32)
Calcium: 9.1 mg/dL (ref 8.9–10.3)
Chloride: 106 mmol/L (ref 98–111)
Creatinine, Ser: 1.56 mg/dL — ABNORMAL HIGH (ref 0.61–1.24)
GFR calc Af Amer: 44 mL/min — ABNORMAL LOW (ref >60)
GFR calc non Af Amer: 38 mL/min — ABNORMAL LOW (ref >60)
Glucose, Bld: 110 mg/dL — ABNORMAL HIGH (ref 70–99)
Potassium: 4.8 mmol/L (ref 3.5–5.1)
Sodium: 139 mmol/L (ref 135–145)

## 2020-03-22 LAB — GLUCOSE, CAPILLARY
Glucose-Capillary: 107 mg/dL — ABNORMAL HIGH (ref 70–99)
Glucose-Capillary: 128 mg/dL — ABNORMAL HIGH (ref 70–99)
Glucose-Capillary: 186 mg/dL — ABNORMAL HIGH (ref 70–99)
Glucose-Capillary: 93 mg/dL (ref 70–99)

## 2020-03-22 LAB — SURGICAL PCR SCREEN
MRSA, PCR: NEGATIVE
Staphylococcus aureus: NEGATIVE

## 2020-03-22 SURGERY — FIXATION, FRACTURE, INTERTROCHANTERIC, WITH INTRAMEDULLARY ROD
Anesthesia: General | Laterality: Right

## 2020-03-22 MED ORDER — CHLORHEXIDINE GLUCONATE 4 % EX LIQD
60.0000 mL | Freq: Once | CUTANEOUS | Status: AC
  Administered 2020-03-22: 4 via TOPICAL
  Filled 2020-03-22: qty 60

## 2020-03-22 MED ORDER — ASPIRIN EC 325 MG PO TBEC
325.0000 mg | DELAYED_RELEASE_TABLET | Freq: Every day | ORAL | Status: DC
  Administered 2020-03-23 – 2020-03-24 (×2): 325 mg via ORAL
  Filled 2020-03-22 (×2): qty 1

## 2020-03-22 MED ORDER — FENTANYL CITRATE (PF) 250 MCG/5ML IJ SOLN
INTRAMUSCULAR | Status: DC | PRN
Start: 2020-03-22 — End: 2020-03-22
  Administered 2020-03-22: 50 ug via INTRAVENOUS
  Administered 2020-03-22: 25 ug via INTRAVENOUS
  Administered 2020-03-22: 50 ug via INTRAVENOUS
  Administered 2020-03-22: 25 ug via INTRAVENOUS

## 2020-03-22 MED ORDER — PHENYLEPHRINE HCL (PRESSORS) 10 MG/ML IV SOLN
INTRAVENOUS | Status: DC | PRN
  Administered 2020-03-22: 80 ug via INTRAVENOUS

## 2020-03-22 MED ORDER — FENTANYL CITRATE (PF) 250 MCG/5ML IJ SOLN
INTRAMUSCULAR | Status: AC
  Filled 2020-03-22: qty 5

## 2020-03-22 MED ORDER — ACETAMINOPHEN 500 MG PO TABS: 500.0000 mg | ORAL_TABLET | Freq: Four times a day (QID) | ORAL | 0 refills | Status: DC | PRN

## 2020-03-22 MED ORDER — CHLORHEXIDINE GLUCONATE 0.12 % MT SOLN
OROMUCOSAL | Status: AC
  Administered 2020-03-22: 15 mL via OROMUCOSAL
  Filled 2020-03-22: qty 15

## 2020-03-22 MED ORDER — ALBUMIN HUMAN 5 % IV SOLN: INTRAVENOUS | Status: DC | PRN

## 2020-03-22 MED ORDER — FENTANYL CITRATE (PF) 100 MCG/2ML IJ SOLN: 25.0000 ug | INTRAMUSCULAR | Status: DC | PRN

## 2020-03-22 MED ORDER — PROPOFOL 10 MG/ML IV BOLUS
INTRAVENOUS | Status: AC
  Filled 2020-03-22: qty 20

## 2020-03-22 MED ORDER — SUGAMMADEX SODIUM 200 MG/2ML IV SOLN
INTRAVENOUS | Status: DC | PRN
  Administered 2020-03-22: 200 mg via INTRAVENOUS

## 2020-03-22 MED ORDER — DEXAMETHASONE SODIUM PHOSPHATE 10 MG/ML IJ SOLN
INTRAMUSCULAR | Status: AC
  Filled 2020-03-22: qty 3

## 2020-03-22 MED ORDER — ONDANSETRON HCL 4 MG/2ML IJ SOLN: 4.0000 mg | Freq: Once | INTRAMUSCULAR | Status: DC | PRN

## 2020-03-22 MED ORDER — DOCUSATE SODIUM 100 MG PO CAPS
100.0000 mg | ORAL_CAPSULE | Freq: Two times a day (BID) | ORAL | Status: DC
  Administered 2020-03-22 – 2020-03-24 (×4): 100 mg via ORAL
  Filled 2020-03-22 (×4): qty 1

## 2020-03-22 MED ORDER — CEFAZOLIN SODIUM-DEXTROSE 2-4 GM/100ML-% IV SOLN
2.0000 g | INTRAVENOUS | Status: AC
  Administered 2020-03-22: 2 g via INTRAVENOUS
  Filled 2020-03-22: qty 100

## 2020-03-22 MED ORDER — PROPOFOL 10 MG/ML IV BOLUS
INTRAVENOUS | Status: DC | PRN
  Administered 2020-03-22: 90 mg via INTRAVENOUS

## 2020-03-22 MED ORDER — METOCLOPRAMIDE HCL 5 MG/ML IJ SOLN: 5.0000 mg | Freq: Three times a day (TID) | INTRAMUSCULAR | Status: DC | PRN

## 2020-03-22 MED ORDER — LIDOCAINE 2% (20 MG/ML) 5 ML SYRINGE
INTRAMUSCULAR | Status: AC
  Filled 2020-03-22: qty 15

## 2020-03-22 MED ORDER — 0.9 % SODIUM CHLORIDE (POUR BTL) OPTIME
TOPICAL | Status: DC | PRN
  Administered 2020-03-22: 1000 mL

## 2020-03-22 MED ORDER — HYDROCODONE-ACETAMINOPHEN 7.5-325 MG PO TABS: 1.0000 | ORAL_TABLET | ORAL | Status: DC | PRN

## 2020-03-22 MED ORDER — ROCURONIUM BROMIDE 10 MG/ML (PF) SYRINGE
PREFILLED_SYRINGE | INTRAVENOUS | Status: AC
  Filled 2020-03-22: qty 30

## 2020-03-22 MED ORDER — LACTATED RINGERS IV SOLN: INTRAVENOUS | Status: DC

## 2020-03-22 MED ORDER — CEFAZOLIN SODIUM-DEXTROSE 2-4 GM/100ML-% IV SOLN
2.0000 g | Freq: Once | INTRAVENOUS | Status: AC
  Administered 2020-03-23: 2 g via INTRAVENOUS
  Filled 2020-03-22: qty 100

## 2020-03-22 MED ORDER — MENTHOL 3 MG MT LOZG: 1.0000 | LOZENGE | OROMUCOSAL | Status: DC | PRN

## 2020-03-22 MED ORDER — MORPHINE SULFATE (PF) 2 MG/ML IV SOLN: 0.5000 mg | INTRAVENOUS | Status: DC | PRN

## 2020-03-22 MED ORDER — ACETAMINOPHEN 325 MG PO TABS: 325.0000 mg | ORAL_TABLET | Freq: Four times a day (QID) | ORAL | Status: DC | PRN

## 2020-03-22 MED ORDER — PHENOL 1.4 % MT LIQD: 1.0000 | OROMUCOSAL | Status: DC | PRN

## 2020-03-22 MED ORDER — ENSURE ENLIVE PO LIQD
237.0000 mL | Freq: Two times a day (BID) | ORAL | Status: DC
  Administered 2020-03-23 – 2020-03-24 (×3): 237 mL via ORAL

## 2020-03-22 MED ORDER — DEXAMETHASONE SODIUM PHOSPHATE 10 MG/ML IJ SOLN
INTRAMUSCULAR | Status: DC | PRN
  Administered 2020-03-22: 10 mg via INTRAVENOUS

## 2020-03-22 MED ORDER — EPHEDRINE 5 MG/ML INJ
INTRAVENOUS | Status: AC
  Filled 2020-03-22: qty 10

## 2020-03-22 MED ORDER — LIDOCAINE 2% (20 MG/ML) 5 ML SYRINGE
INTRAMUSCULAR | Status: DC | PRN
  Administered 2020-03-22: 20 mg via INTRAVENOUS

## 2020-03-22 MED ORDER — PHENYLEPHRINE HCL-NACL 10-0.9 MG/250ML-% IV SOLN
INTRAVENOUS | Status: DC | PRN
  Administered 2020-03-22: 25 ug/min via INTRAVENOUS

## 2020-03-22 MED ORDER — ROCURONIUM BROMIDE 10 MG/ML (PF) SYRINGE
PREFILLED_SYRINGE | INTRAVENOUS | Status: DC | PRN
  Administered 2020-03-22: 50 mg via INTRAVENOUS

## 2020-03-22 MED ORDER — HYDROCODONE-ACETAMINOPHEN 5-325 MG PO TABS: 1.0000 | ORAL_TABLET | ORAL | Status: DC | PRN

## 2020-03-22 MED ORDER — METOCLOPRAMIDE HCL 5 MG PO TABS: 5.0000 mg | ORAL_TABLET | Freq: Three times a day (TID) | ORAL | Status: DC | PRN

## 2020-03-22 MED ORDER — PHENYLEPHRINE 40 MCG/ML (10ML) SYRINGE FOR IV PUSH (FOR BLOOD PRESSURE SUPPORT)
PREFILLED_SYRINGE | INTRAVENOUS | Status: AC
  Filled 2020-03-22: qty 30

## 2020-03-22 MED ORDER — SUCCINYLCHOLINE CHLORIDE 200 MG/10ML IV SOSY
PREFILLED_SYRINGE | INTRAVENOUS | Status: AC
  Filled 2020-03-22: qty 20

## 2020-03-22 MED ORDER — ONDANSETRON HCL 4 MG/2ML IJ SOLN
INTRAMUSCULAR | Status: AC
  Filled 2020-03-22: qty 6

## 2020-03-22 MED ORDER — CHLORHEXIDINE GLUCONATE 0.12 % MT SOLN: 15.0000 mL | Freq: Once | OROMUCOSAL | Status: AC

## 2020-03-22 MED ORDER — ONDANSETRON HCL 4 MG/2ML IJ SOLN: 4.0000 mg | Freq: Four times a day (QID) | INTRAMUSCULAR | Status: DC | PRN

## 2020-03-22 MED ORDER — ONDANSETRON HCL 4 MG PO TABS: 4.0000 mg | ORAL_TABLET | Freq: Four times a day (QID) | ORAL | Status: DC | PRN

## 2020-03-22 MED ORDER — DEXTROSE-NACL 5-0.45 % IV SOLN: INTRAVENOUS | Status: AC

## 2020-03-22 MED ORDER — ADULT MULTIVITAMIN W/MINERALS CH
1.0000 | ORAL_TABLET | Freq: Every day | ORAL | Status: DC
  Administered 2020-03-23 – 2020-03-24 (×2): 1 via ORAL
  Filled 2020-03-22 (×2): qty 1

## 2020-03-22 MED ORDER — ONDANSETRON HCL 4 MG/2ML IJ SOLN
INTRAMUSCULAR | Status: DC | PRN
  Administered 2020-03-22: 4 mg via INTRAVENOUS

## 2020-03-22 MED ORDER — POVIDONE-IODINE 10 % EX SWAB: 2.0000 "application " | Freq: Once | CUTANEOUS | Status: DC

## 2020-03-22 MED ORDER — ACETAMINOPHEN 10 MG/ML IV SOLN
INTRAVENOUS | Status: DC | PRN
  Administered 2020-03-22: 1000 mg via INTRAVENOUS

## 2020-03-22 SURGICAL SUPPLY — 35 items
BIT DRILL CALIBRATED 4.2 (BIT) IMPLANT
BLADE SURG 15 STRL LF DISP TIS (BLADE) ×1 IMPLANT
BLADE SURG 15 STRL SS (BLADE) ×3
BLADE TFNA HELICAL 100 NS (Anchor) ×2 IMPLANT
COVER BACK TABLE 60X90IN (DRAPES) ×3 IMPLANT
COVER PERINEAL POST (MISCELLANEOUS) ×3 IMPLANT
COVER SURGICAL LIGHT HANDLE (MISCELLANEOUS) ×6 IMPLANT
COVER WAND RF STERILE (DRAPES) ×3 IMPLANT
DRAPE C-ARM 42X72 X-RAY (DRAPES) ×3 IMPLANT
DRAPE STERI IOBAN 125X83 (DRAPES) ×3 IMPLANT
DRILL BIT CALIBRATED 4.2 (BIT) ×3
DRSG ADAPTIC 3X8 NADH LF (GAUZE/BANDAGES/DRESSINGS) ×3 IMPLANT
DRSG MEPILEX BORDER 4X4 (GAUZE/BANDAGES/DRESSINGS) ×3 IMPLANT
DRSG MEPILEX BORDER 4X8 (GAUZE/BANDAGES/DRESSINGS) ×3 IMPLANT
ELECT REM PT RETURN 9FT ADLT (ELECTROSURGICAL) ×3
ELECTRODE REM PT RTRN 9FT ADLT (ELECTROSURGICAL) ×1 IMPLANT
EVACUATOR 1/8 PVC DRAIN (DRAIN) IMPLANT
GLOVE BIOGEL PI IND STRL 9 (GLOVE) ×1 IMPLANT
GLOVE BIOGEL PI INDICATOR 9 (GLOVE) ×2
GLOVE SURG ORTHO 9.0 STRL STRW (GLOVE) ×3 IMPLANT
GOWN STRL REUS W/ TWL XL LVL3 (GOWN DISPOSABLE) ×3 IMPLANT
GOWN STRL REUS W/TWL XL LVL3 (GOWN DISPOSABLE) ×9
GUIDEWIRE 3.2X400 (WIRE) ×2 IMPLANT
IMPL DEG TI CANN 11MM/130 (Orthopedic Implant) IMPLANT
IMPLANT DEG TI CANN 11MM/130 (Orthopedic Implant) ×3 IMPLANT
KIT BASIN OR (CUSTOM PROCEDURE TRAY) ×3 IMPLANT
KIT TURNOVER KIT B (KITS) ×3 IMPLANT
MANIFOLD NEPTUNE II (INSTRUMENTS) ×3 IMPLANT
NS IRRIG 1000ML POUR BTL (IV SOLUTION) ×3 IMPLANT
PACK GENERAL/GYN (CUSTOM PROCEDURE TRAY) ×3 IMPLANT
PAD ARMBOARD 7.5X6 YLW CONV (MISCELLANEOUS) ×6 IMPLANT
SCREW LOCK STAR 5X34 (Screw) ×2 IMPLANT
STAPLER VISISTAT 35W (STAPLE) IMPLANT
SUT VIC AB 2-0 CTB1 (SUTURE) IMPLANT
WATER STERILE IRR 1000ML POUR (IV SOLUTION) ×6 IMPLANT

## 2020-03-22 NOTE — Progress Notes (Signed)
Patient lying in bed.  Awake confused.   Spoke with nursing patient's power of attorney is his wife.  They will contact her for consent for surgery later today

## 2020-03-22 NOTE — Progress Notes (Signed)
PHARMACY NOTE:  ANTIMICROBIAL RENAL DOSAGE ADJUSTMENT  Current antimicrobial regimen includes a mismatch between antimicrobial dosage and estimated renal function.  As per policy approved by the Pharmacy & Therapeutics and Medical Executive Committees, the antimicrobial dosage will be adjusted accordingly.  Current antimicrobial dosage:  Cefazolin 2g IV q6 x2  Indication: Post surgical prophylaxis  Renal Function:  Estimated Creatinine Clearance: 29.9 mL/min (A) (by C-G formula based on SCr of 1.56 mg/dL (H)). []      On intermittent HD, scheduled: []      On CRRT    Antimicrobial dosage has been changed to:  Cefazolin 2g IV x1 12 hrs post dose  Additional comments:   Onnie Boer, PharmD, BCIDP, AAHIVP, CPP Infectious Disease Pharmacist 03/22/2020 7:08 PM

## 2020-03-22 NOTE — Transfer of Care (Signed)
Immediate Anesthesia Transfer of Care Note  Patient: [name] [name]  Procedure(s) Performed: INTRAMEDULLARY (IM) NAIL INTERTROCHANTRIC (Right )  Patient Location: PACU  Anesthesia Type:General  Level of Consciousness: drowsy  Airway & Oxygen Therapy: Patient Spontanous Breathing and Patient connected to nasal cannula oxygen  Post-op Assessment: Report given to RN and Post -op Vital signs reviewed and stable  Post vital signs: Reviewed and stable  Last Vitals:  Vitals Value Taken Time  BP 142/100 03/22/20 1750  Temp    Pulse 69 03/22/20 1753  Resp 20 03/22/20 1753  SpO2 100 % 03/22/20 1753  Vitals shown include unvalidated device data.  Last Pain:  Vitals:   03/22/20 0827  TempSrc: Oral  PainSc:          Complications: No complications documented.

## 2020-03-22 NOTE — Interval H&P Note (Signed)
History and Physical Interval Note:  03/22/2020 6:36 AM  [name] [name]  has presented today for surgery, with the diagnosis of RIGHT HIP FRACTURE.  The various methods of treatment have been discussed with the patient and family. After consideration of risks, benefits and other options for treatment, the patient has consented to  Procedure(s): INTRAMEDULLARY (IM) NAIL INTERTROCHANTRIC (Right) as a surgical intervention.  The patient's history has been reviewed, patient examined, no change in status, stable for surgery.  I have reviewed the patient's chart and labs.  Questions were answered to the patient's satisfaction.     Newt Minion

## 2020-03-22 NOTE — Progress Notes (Signed)
Initial Nutrition Assessment  RD working remotely.  DOCUMENTATION CODES:   Not applicable  INTERVENTION:   -Once diet is advanced, add:  -Ensure Enlive po BID, each supplement provides 350 kcal and 20 grams of protein -MVI with minerals daily  NUTRITION DIAGNOSIS:   Increased nutrient needs related to post-op healing as evidenced by estimated needs.  GOAL:   Patient will meet greater than or equal to 90% of their needs  MONITOR:   PO intake, Supplement acceptance, Diet advancement, Labs, Weight trends, Skin, I & O's  REASON FOR ASSESSMENT:   Consult Assessment of nutrition requirement/status, Hip fracture protocol  ASSESSMENT:   MONISH HALIBURTON is a 86 y.o. male with medical history significant of lower GI bleed; HTN; HLD; dementia; afib; and CAD presenting with a fall.  Pt admitted with rt hip fracture s/p fall.   Reviewed I/O's: -500 ml x 24 hours  UOP: 500 ml x 24 hours  Attempted to speak with pt via call to hospital room phone, however, unable to reach.   Per orthopedics notes, plan for IMN today. Pt currently NPO for procedure.   Reviewed wt hx; wt has been stable over the past year.   Pt with increased nutritional needs for post-operative healing and would greatly benefit from addition of oral nutrition supplements.   Medications reviewed and include colace.   Lab Results  Component Value Date   HGBA1C  12/13/2008    5.5 (NOTE) The ADA recommends the following therapeutic goal for glycemic control related to Hgb A1c measurement: Goal of therapy: <6.5 Hgb A1c  Reference: American Diabetes Association: Clinical Practice Recommendations 2010, Diabetes Care, 2010, 33: (Suppl  1).   PTA DM medications are none.   Labs reviewed: CBGS: 107-128 (inpatient orders for glycemic control are none).   Diet Order:   Diet Order            Diet NPO time specified Except for: Sips with Meds  Diet effective midnight                 EDUCATION NEEDS:   No  education needs have been identified at this time  Skin:  Skin Assessment: Skin Integrity Issues: Skin Integrity Issues:: Other (Comment) Other: skin tears to rt and lt elbows  Last BM:  03/20/20  Height:   Ht Readings from Last 1 Encounters:  03/21/20 5\' 10"  (1.778 m)    Weight:   Wt Readings from Last 1 Encounters:  03/21/20 70 kg    Ideal Body Weight:  75.5 kg  BMI:  Body mass index is 22.14 kg/m.  Estimated Nutritional Needs:   Kcal:  1900-2100  Protein:  90-105 grams  Fluid:  > 1.9 L    Loistine Chance, RD, LDN, [place] Registered Dietitian II Certified Diabetes Care and Education Specialist Please refer to [facility] for RD and/or RD on-call/weekend/after hours pager

## 2020-03-22 NOTE — Plan of Care (Signed)

## 2020-03-22 NOTE — Progress Notes (Addendum)
PROGRESS NOTE    [name] [name]  MGN:[id number] DOB: 01-Apr-1926 DOA: 03/21/2020 PCP: [name], MD    Brief Narrative:  REYNOLDO [name] is a 86 year old male with past medical history notable for essential hypertension, hyperlipidemia, dementia, paroxysmal atrial fibrillation, CAD, history of lower GI bleed who presented to the ED following a fall.  No loss of consciousness reported, his son was able to get him up.  At baseline, patient ambulates with a cane.  In the ED, patient was noted to have a mild AKI with right hip fracture.  Orthopedics was consulted.  TRH consulted for admission.   Assessment & Plan:   Principal Problem:   Hip fracture ([place]) Active Problems:   Permanent atrial fibrillation ([place])   Coronary artery disease involving native coronary artery of native heart without angina pectoris   Alzheimer's disease ([place])   Hypercholesterolemia   AAA (abdominal aortic aneurysm) without rupture (H[facility])   HTN (hypertension)   DNR (do not resuscitate)   Right intertrochanteric hip fracture Patient presenting to the ED following mechanical fall in the bathroom.  CT head/C-spine with no acute findings but with stable atrophy and diffuse osteoporosis and stable 3 mm anterolisthesis C7 on T1.  Right hip x-ray with right femoral intratrochanteric fracture. --Orthopedics following, appreciate assistance --Plan operative management today with ORIF/IMN --N.p.o., D5 half NS at 44mL/hr --Dilaudid 0.5 mg IV every 2 hours as needed; will change to oral medications following surgery --Robaxin as needed for muscle spasm --PT/OT following surgical intervention --Postoperative DVT prophylaxis per orthopedics  Dementia --Donepezil 5 mg p.o. twice daily  Anxiety/depression: Continue sertraline 25 mg p.o. daily   Hyperlipidemia: Continue atorvastatin 40 mg p.o. daily  BPH: Continue finasteride 5 mg p.o. daily  Permanent atrial fibrillation Rate controlled without medication, has  pacer in place.  His home anticoagulation was stopped due to falls and GI bleeding. --Continue aspirin 81 mg p.o. daily  History of essential hypertension No longer on antihypertensive therapy.  BP 113/79, well controlled. --Continue monitor blood pressure closely  CAD/AAA Not a candidate for surgical repair. --Continue aspirin   DVT prophylaxis: SCDs, postoperative per orthopedics Code Status: DNR Family Communication: No family present at bedside this morning, attempted to contact patient's spouse via telephone, unsuccessful, message left  Disposition Plan:  Status is: Inpatient  Remains inpatient appropriate because:Ongoing active pain requiring inpatient pain management, Ongoing diagnostic testing needed not appropriate for outpatient work up, Unsafe d/c plan, IV treatments appropriate due to intensity of illness or inability to take PO and Inpatient level of care appropriate due to severity of illness   Dispo: The patient is from: Home              Anticipated d/c is to: SNF              Anticipated d/c date is: 3 days              Patient currently is not medically stable to d/c.   Consultants:   Orthopedics, Dr. Sharol Given  Procedures:   Plan ORIF w/ IMN, Dr. Sharol Given 9/21  Antimicrobials:   Perioperative cefazolin   Subjective: Patient seen and examined bedside, resting comfortably.  Pain is now under control.  Awaiting for operative management later this afternoon.  No family present at bedside.  Denies headache, no fever/chills/night sweats, no nausea/vomiting/diarrhea, no chest pain, palpitations, no shortness of breath, no abdominal pain.  No acute events overnight per nursing staff.  Objective: Vitals:   03/21/20 2043 03/22/20 0413  03/22/20 0827 03/22/20 1002  BP: 123/84 113/79 115/82   Pulse: 61 65  98  Resp: 18 16 16    Temp: 99.3 F (37.4 C) 98 F (36.7 C) 98.3 F (36.8 C)   TempSrc: Oral Oral Oral   SpO2: 95% 97% 98% 99%  Weight:      Height:         Intake/Output Summary (Last 24 hours) at 03/22/2020 1258 Last data filed at 03/22/2020 1200 Gross per 24 hour  Intake 0 ml  Output 900 ml  Net -900 ml   Filed Weights   03/21/20 1200  Weight: 70 kg    Examination:  General exam: Appears calm and comfortable, pleasantly confused Respiratory system: Clear to auscultation. Respiratory effort normal.  Oxygen well on room air Cardiovascular system: S1 & S2 heard, RRR. No JVD, murmurs, rubs, gallops or clicks. No pedal edema. Gastrointestinal system: Abdomen is nondistended, soft and nontender. No organomegaly or masses felt. Normal bowel sounds heard. Central nervous system: Alert. No focal neurological deficits. Extremities: Right lower extremity shortened with external rotation Skin: No rashes, lesions or ulcers Psychiatry: Judgement and insight appear poor. Mood & affect appropriate.     Data Reviewed: I have personally reviewed following labs and imaging studies  CBC: Recent Labs  Lab 03/21/20 1202 03/22/20 0820  WBC 6.3 9.8  NEUTROABS 3.8  --   HGB 11.5* 9.9*  HCT 37.4* 32.3*  MCV 101.9* 99.1  PLT 209 706   Basic Metabolic Panel: Recent Labs  Lab 03/21/20 1202 03/22/20 0820  NA 139 139  K 4.5 4.8  CL 105 106  CO2 23 23  GLUCOSE 88 110*  BUN 28* 30*  CREATININE 1.56* 1.56*  CALCIUM 9.2 9.1   GFR: Estimated Creatinine Clearance: 29.9 mL/min (A) (by C-G formula based on SCr of 1.56 mg/dL (H)). Liver Function Tests: No results for input(s): AST, ALT, ALKPHOS, BILITOT, PROT, ALBUMIN in the last 168 hours. No results for input(s): LIPASE, AMYLASE in the last 168 hours. No results for input(s): AMMONIA in the last 168 hours. Coagulation Profile: Recent Labs  Lab 03/21/20 1202  INR 1.2   Cardiac Enzymes: No results for input(s): CKTOTAL, CKMB, CKMBINDEX, TROPONINI in the last 168 hours. BNP (last 3 results) No results for input(s): PROBNP in the last 8760 hours. HbA1C: No results for input(s): HGBA1C  in the last 72 hours. CBG: Recent Labs  Lab 03/22/20 0022 03/22/20 0625 03/22/20 1153  GLUCAP 128* 107* 93   Lipid Profile: No results for input(s): CHOL, HDL, LDLCALC, TRIG, CHOLHDL, LDLDIRECT in the last 72 hours. Thyroid Function Tests: No results for input(s): TSH, T4TOTAL, FREET4, T3FREE, THYROIDAB in the last 72 hours. Anemia Panel: No results for input(s): VITAMINB12, FOLATE, FERRITIN, TIBC, IRON, RETICCTPCT in the last 72 hours. Sepsis Labs: No results for input(s): PROCALCITON, LATICACIDVEN in the last 168 hours.  Recent Results (from the past 240 hour(s))  SARS Coronavirus 2 by RT PCR (hospital order, performed in [facility] hospital lab) Nasopharyngeal Nasopharyngeal Swab     Status: None   Collection Time: 03/21/20  2:02 PM   Specimen: Nasopharyngeal Swab  Result Value Ref Range Status   SARS Coronavirus 2 NEGATIVE NEGATIVE Final    Comment: (NOTE) SARS-CoV-2 target nucleic acids are NOT DETECTED.  The SARS-CoV-2 RNA is generally detectable in upper and lower respiratory specimens during the acute phase of infection. The lowest concentration of SARS-CoV-2 viral copies this assay can detect is 250 copies / mL. A negative result does  not preclude SARS-CoV-2 infection and should not be used as the sole basis for treatment or other patient management decisions.  A negative result may occur with improper specimen collection / handling, submission of specimen other than nasopharyngeal swab, presence of viral mutation(s) within the areas targeted by this assay, and inadequate number of viral copies (<250 copies / mL). A negative result must be combined with clinical observations, patient history, and epidemiological information.  Fact Sheet for Patients:   StrictlyIdeas.no  Fact Sheet for Healthcare Providers: BankingDealers.co.za  This test is not yet approved or  cleared by the [place] FDA and has been  authorized for detection and/or diagnosis of SARS-CoV-2 by FDA under an Emergency Use Authorization (EUA).  This EUA will remain in effect (meaning this test can be used) for the duration of the COVID-19 declaration under Section 564(b)(1) of the Act, 21 U.S.C. section 360bbb-3(b)(1), unless the authorization is terminated or revoked sooner.  Performed at [facility], [place] [address] [place], [place] [address]   Surgical pcr screen     Status: None   Collection Time: 03/22/20 12:52 AM   Specimen: Nasal Mucosa; Nasal Swab  Result Value Ref Range Status   MRSA, PCR NEGATIVE NEGATIVE Final   Staphylococcus aureus NEGATIVE NEGATIVE Final    Comment: (NOTE) The Xpert SA Assay (FDA approved for NASAL specimens in patients 52 years of age and older), is one component of a comprehensive surveillance program. It is not intended to diagnose infection nor to guide or monitor treatment. Performed at [facility], [place] [address] [place], [place] [address]          Radiology Studies: DG Chest 1 View  Result Date: 03/21/2020 CLINICAL DATA:  [place] and fractured right hip. EXAM: CHEST  1 VIEW COMPARISON:  Chest x-ray 12/25/2018 FINDINGS: The right atrial and right ventricular pacer wires are in good position, unchanged. The heart is normal in size. Stable tortuosity and calcification of the thoracic aorta. Stable surgical changes from bypass surgery. Chronic bronchitic type interstitial lung changes but no acute pulmonary findings. No pleural effusion or pneumothorax. The bony thorax is grossly intact. IMPRESSION: Chronic lung changes but no acute pulmonary findings. Electronically Signed   By: Marijo Sanes M.D.   On: 03/21/2020 12:54   CT HEAD WO CONTRAST  Result Date: 03/21/2020 CLINICAL DATA:  Pain following fall EXAM: CT HEAD WITHOUT CONTRAST CT CERVICAL SPINE WITHOUT CONTRAST TECHNIQUE: Multidetector CT imaging of the head and cervical spine was performed following the standard  protocol without intravenous contrast. Multiplanar CT image reconstructions of the cervical spine were also generated. COMPARISON:  CT head; CT cervical spine June 05, 2018 FINDINGS: CT HEAD FINDINGS Brain: Moderate diffuse atrophy is stable. There is no intracranial mass, hemorrhage, extra-axial fluid collection, or midline shift. There is patchy small vessel disease in the centra semiovale bilaterally. No acute infarct is appreciable. Vascular: No hyperdense vessel. There is calcification in each carotid siphon region. Skull: Bony calvarium appears intact. Sinuses/Orbits: There is mucosal thickening in several ethmoid air cells. Other paranasal sinuses are clear. Orbits appear symmetric bilaterally. Other: Mastoid air cells are clear. CT CERVICAL SPINE FINDINGS Alignment: There is 3 mm of anterolisthesis of C7 on T1, stable. There is no new spondylolisthesis. Skull base and vertebrae: There is pannus posterior to the odontoid without impression on the craniocervical junction. There is extensive erosion in the odontoid, stable from prior study. Bones are osteoporotic. No acute fracture is appreciable. No blastic or lytic  bone lesions are evident. Soft tissues and spinal canal: Prevertebral soft tissues and predental space regions are normal. There is no appreciable cord or canal hematoma. No paraspinous lesions. Disc levels: There is severe disc space narrowing at C4-5, C5-6, and C7-T1. There is ankylosis at C6-7 and T1-2, stable. There is facet osteoarthritic change to varying degrees at all levels. Exit foraminal narrowing is noted at C3-4 bilaterally CT and at C5-6 bilaterally with impression on exiting nerve roots. A lesser degree of arthropathy is noted at other levels. No disc extrusion or high-grade stenosis. Note that there is moderate broad-based disc bulging at C5-6 and C6-7. Upper chest: There is emphysematous change in the upper lobes. No edema or consolidation noted in the upper lobe regions. Other:  There are foci of subclavian and carotid artery calcification bilaterally. IMPRESSION: CT head: Stable atrophy with periventricular small vessel disease. No mass or hemorrhage. No extra-axial fluid collection. No acute infarct. There are foci of arterial vascular calcification. There is mucosal thickening in several ethmoid air cells. CT cervical spine: Diffuse osteoporosis. No fracture. Stable 3 mm of anterolisthesis of C7 on T1. No new spondylolisthesis. Multilevel arthropathy, essentially stable. Impression on exiting nerve roots, most severe at C3-4 and C5-6 bilaterally. No frank disc extrusion or high-grade stenosis. Underlying emphysematous change. Foci of subclavian and carotid artery calcification noted bilaterally. Electronically Signed   By: Lowella Grip III M.D.   On: 03/21/2020 13:29   CT CERVICAL SPINE WO CONTRAST  Result Date: 03/21/2020 CLINICAL DATA:  Pain following fall EXAM: CT HEAD WITHOUT CONTRAST CT CERVICAL SPINE WITHOUT CONTRAST TECHNIQUE: Multidetector CT imaging of the head and cervical spine was performed following the standard protocol without intravenous contrast. Multiplanar CT image reconstructions of the cervical spine were also generated. COMPARISON:  CT head; CT cervical spine June 05, 2018 FINDINGS: CT HEAD FINDINGS Brain: Moderate diffuse atrophy is stable. There is no intracranial mass, hemorrhage, extra-axial fluid collection, or midline shift. There is patchy small vessel disease in the centra semiovale bilaterally. No acute infarct is appreciable. Vascular: No hyperdense vessel. There is calcification in each carotid siphon region. Skull: Bony calvarium appears intact. Sinuses/Orbits: There is mucosal thickening in several ethmoid air cells. Other paranasal sinuses are clear. Orbits appear symmetric bilaterally. Other: Mastoid air cells are clear. CT CERVICAL SPINE FINDINGS Alignment: There is 3 mm of anterolisthesis of C7 on T1, stable. There is no new  spondylolisthesis. Skull base and vertebrae: There is pannus posterior to the odontoid without impression on the craniocervical junction. There is extensive erosion in the odontoid, stable from prior study. Bones are osteoporotic. No acute fracture is appreciable. No blastic or lytic bone lesions are evident. Soft tissues and spinal canal: Prevertebral soft tissues and predental space regions are normal. There is no appreciable cord or canal hematoma. No paraspinous lesions. Disc levels: There is severe disc space narrowing at C4-5, C5-6, and C7-T1. There is ankylosis at C6-7 and T1-2, stable. There is facet osteoarthritic change to varying degrees at all levels. Exit foraminal narrowing is noted at C3-4 bilaterally CT and at C5-6 bilaterally with impression on exiting nerve roots. A lesser degree of arthropathy is noted at other levels. No disc extrusion or high-grade stenosis. Note that there is moderate broad-based disc bulging at C5-6 and C6-7. Upper chest: There is emphysematous change in the upper lobes. No edema or consolidation noted in the upper lobe regions. Other: There are foci of subclavian and carotid artery calcification bilaterally. IMPRESSION: CT head: Stable atrophy with  periventricular small vessel disease. No mass or hemorrhage. No extra-axial fluid collection. No acute infarct. There are foci of arterial vascular calcification. There is mucosal thickening in several ethmoid air cells. CT cervical spine: Diffuse osteoporosis. No fracture. Stable 3 mm of anterolisthesis of C7 on T1. No new spondylolisthesis. Multilevel arthropathy, essentially stable. Impression on exiting nerve roots, most severe at C3-4 and C5-6 bilaterally. No frank disc extrusion or high-grade stenosis. Underlying emphysematous change. Foci of subclavian and carotid artery calcification noted bilaterally. Electronically Signed   By: Lowella Grip III M.D.   On: 03/21/2020 13:29   DG Hip Unilat With Pelvis 2-3 Views  Right  Result Date: 03/21/2020 CLINICAL DATA:  Fall, right hip pain EXAM: DG HIP (WITH OR WITHOUT PELVIS) 2-3V RIGHT COMPARISON:  12/25/2018 FINDINGS: Right femoral intertrochanteric fracture noted. Minimal displacement. No significant angulation. No no subluxation or dislocation. Hardware seen within the left proximal femur. IMPRESSION: Right femoral intertrochanteric fracture. Electronically Signed   By: Rolm Baptise M.D.   On: 03/21/2020 12:53        Scheduled Meds: . aspirin EC  81 mg Oral Daily  . atorvastatin  40 mg Oral QPM  . docusate sodium  100 mg Oral BID  . donepezil  5 mg Oral BID  . [START ON 03/23/2020] feeding supplement (ENSURE ENLIVE)  237 mL Oral BID BM  . finasteride  5 mg Oral QPM  . [START ON 03/23/2020] multivitamin with minerals  1 tablet Oral Daily  . povidone-iodine  2 application Topical Once  . sertraline  25 mg Oral Daily   Continuous Infusions: .  ceFAZolin (ANCEF) IV    . dextrose 5 % and 0.45% NaCl 75 mL/hr at 03/22/20 0848  . methocarbamol (ROBAXIN) IV       LOS: 1 day    Time spent: 38 minutes spent on chart review, discussion with nursing staff, consultants, updating family and interview/physical exam; more than 50% of that time was spent in counseling and/or coordination of care.    Lurlie Wigen J [place] DO [facility] Available via Epic secure chat 7am-7pm After these hours, please refer to coverage provider listed on [email or web address] 03/22/2020, 12:58 PM

## 2020-03-22 NOTE — Op Note (Signed)
DATE OF SURGERY:  03/22/2020  TIME: 5:42 PM  PATIENT NAME:  [name] [name]     AGE: 86 y.o.  PRE-OPERATIVE DIAGNOSIS:  RIGHT HIP FRACTURE  POST-OPERATIVE DIAGNOSIS:  SAME  PROCEDURE:  INTRAMEDULLARY (IM) NAIL INTERTROCHANTRIC  SURGEON:  Newt Minion  ASSISTANT:  none.  OPERATIVE IMPLANTS: Synthes trochanteric femoral nail with interlocking helical blade, locked proximally and distally.  PREOPERATIVE INDICATIONS:  [name] [name] is a 86 y.o. year old who fell and suffered a hip fracture. He was brought into the ER and then admitted and optimized and then elected for surgical intervention.    The risks benefits and alternatives were discussed with the patient including but not limited to the risks of nonoperative treatment, versus surgical intervention including infection, bleeding, nerve injury, malunion, nonunion, hardware prominence, hardware failure, need for hardware removal, blood clots, cardiopulmonary complications, morbidity, mortality, among others, and they were willing to proceed.    OPERATIVE PROCEDURE:  The patient was brought to the operating room and placed in the supine position on the [place] fracture table. General anesthesia was administered. He was placed on the fracture table.  Closed reduction was performed under C-arm guidance. Time out was then performed after sterile prep with Duraprep and draped into a sterile field with the shower curtain. He received preoperative antibiotics.  Incision was made proximal to the greater trochanter. A guidewire was placed centered on the greater trochanter. Confirmation was made on AP and lateral views. The femoral canal was then reamed over the wire. Wire and reamer were removed and the nail was inserted.  A guidepin was placed through the jig into the femoral head close to the center center position of the femoral head. The helical screw length was measured the cortex was reamed and the helical screw was inserted. C-arm was used to  verify position of the spiral blade.  The compression screw proximally lock the spiral blade.  The distal interlock was placed using the jig.  Instruments were removed and final C-arm pictures AP and lateral were obtained.  The wounds were irrigated  and closed with Vicryl followed by staples and Mepilex dressing. The patient was awakened and returned to PACU in stable and satisfactory condition. There no complications. Weightbearing as tolerated.  Now 170 x 11 mm in diameter 90 mm blade screw and 34 mm interlocking screw.  Patient received Kefzol preoperatively.  Timeout was called prior to surgery.  Newt Minion

## 2020-03-22 NOTE — Anesthesia Procedure Notes (Signed)
Procedure Name: Intubation Date/Time: 03/22/2020 4:51 PM Performed by: Clearnce Sorrel, CRNA Pre-anesthesia Checklist: Patient identified, Emergency Drugs available, Suction available, Patient being monitored and Timeout performed Patient Re-evaluated:Patient Re-evaluated prior to induction Oxygen Delivery Method: Circle system utilized Preoxygenation: Pre-oxygenation with 100% oxygen Induction Type: IV induction Ventilation: Mask ventilation without difficulty and Oral airway inserted - appropriate to patient size Laryngoscope Size: Mac and 4 Grade View: Grade I Tube type: Oral Tube size: 7.5 mm Number of attempts: 1 Airway Equipment and Method: Stylet Placement Confirmation: ETT inserted through vocal cords under direct vision,  positive ETCO2 and breath sounds checked- equal and bilateral Secured at: 23 cm Tube secured with: Tape Dental Injury: Teeth and Oropharynx as per pre-operative assessment

## 2020-03-22 NOTE — Anesthesia Preprocedure Evaluation (Addendum)
Anesthesia Evaluation  Patient identified by MRN, date of birth, ID band Patient confused    Reviewed: Allergy & Precautions, NPO status , Patient's Chart, lab work & pertinent test results  Airway Mallampati: II  TM Distance: >3 FB   Mouth opening: Limited Mouth Opening  Dental  (+) Teeth Intact, Dental Advisory Given   Pulmonary former smoker,    breath sounds clear to auscultation       Cardiovascular hypertension,  Rhythm:Irregular Rate:Normal     Neuro/Psych    GI/Hepatic   Endo/Other    Renal/GU      Musculoskeletal   Abdominal   Peds  Hematology   Anesthesia Other Findings   Reproductive/Obstetrics                             Anesthesia Physical Anesthesia Plan  ASA: III  Anesthesia Plan: General   Post-op Pain Management:    Induction: Intravenous  PONV Risk Score and Plan: Ondansetron and Dexamethasone  Airway Management Planned: Oral ETT  Additional Equipment:   Intra-op Plan:   Post-operative Plan: Extubation in OR  Informed Consent: I have reviewed the patients History and Physical, chart, labs and discussed the procedure including the risks, benefits and alternatives for the proposed anesthesia with the patient or authorized representative who has indicated his/her understanding and acceptance.     Dental advisory given  Plan Discussed with: Anesthesiologist and CRNA  Anesthesia Plan Comments:         Anesthesia Quick Evaluation

## 2020-03-23 ENCOUNTER — Encounter (HOSPITAL_COMMUNITY): Payer: Self-pay | Admitting: Orthopedic Surgery

## 2020-03-23 DIAGNOSIS — D6869 Other thrombophilia: Secondary | ICD-10-CM

## 2020-03-23 LAB — BASIC METABOLIC PANEL
Anion gap: 8 (ref 5–15)
BUN: 32 mg/dL — ABNORMAL HIGH (ref 8–23)
CO2: 25 mmol/L (ref 22–32)
Calcium: 8.6 mg/dL — ABNORMAL LOW (ref 8.9–10.3)
Chloride: 103 mmol/L (ref 98–111)
Creatinine, Ser: 1.72 mg/dL — ABNORMAL HIGH (ref 0.61–1.24)
GFR calc Af Amer: 39 mL/min — ABNORMAL LOW (ref >60)
GFR calc non Af Amer: 34 mL/min — ABNORMAL LOW (ref >60)
Glucose, Bld: 137 mg/dL — ABNORMAL HIGH (ref 70–99)
Potassium: 3.9 mmol/L (ref 3.5–5.1)
Sodium: 136 mmol/L (ref 135–145)

## 2020-03-23 LAB — CBC
HCT: 28.4 % — ABNORMAL LOW (ref 39.0–52.0)
Hemoglobin: 8.7 g/dL — ABNORMAL LOW (ref 13.0–17.0)
MCH: 30.9 pg (ref 26.0–34.0)
MCHC: 30.6 g/dL (ref 30.0–36.0)
MCV: 100.7 fL — ABNORMAL HIGH (ref 80.0–100.0)
Platelets: 159 10*3/uL (ref 150–400)
RBC: 2.82 MIL/uL — ABNORMAL LOW (ref 4.22–5.81)
RDW: 14.5 % (ref 11.5–15.5)
WBC: 11.1 10*3/uL — ABNORMAL HIGH (ref 4.0–10.5)
nRBC: 0 % (ref 0.0–0.2)

## 2020-03-23 LAB — GLUCOSE, CAPILLARY
Glucose-Capillary: 139 mg/dL — ABNORMAL HIGH (ref 70–99)
Glucose-Capillary: 324 mg/dL — ABNORMAL HIGH (ref 70–99)
Glucose-Capillary: 131 mg/dL — ABNORMAL HIGH (ref 70–99)

## 2020-03-23 MED ORDER — SODIUM CHLORIDE 0.9 % IV BOLUS
500.0000 mL | Freq: Once | INTRAVENOUS | Status: AC
  Administered 2020-03-23: 500 mL via INTRAVENOUS

## 2020-03-23 NOTE — Progress Notes (Signed)
Nutrition Follow-up  DOCUMENTATION CODES:   Not applicable  INTERVENTION:   -Continue Ensure Enlive po BID, each supplement provides 350 kcal and 20 grams of protein -Continue MVI with minerals daily -Change regular diet to "with assist"  NUTRITION DIAGNOSIS:   Increased nutrient needs related to post-op healing as evidenced by estimated needs.  Ongoing  GOAL:   Patient will meet greater than or equal to 90% of their needs  Progressing   MONITOR:   PO intake, Supplement acceptance, Diet advancement, Labs, Weight trends, Skin, I & O's  REASON FOR ASSESSMENT:   Consult Assessment of nutrition requirement/status, Hip fracture protocol  ASSESSMENT:   [name] [name] is a 86 y.o. male with medical history significant of lower GI bleed; HTN; HLD; dementia; afib; and CAD presenting with a fall.  9/21- s/p PROCEDURE:  INTRAMEDULLARY (IM) NAIL INTERTROCHANTRIC  Reviewed I/O's: +1.5 L x 24 hours and +966 ml since admission  UOP: 800 ml x 24 hours  Spoke with pt at bedside, who was pleasant and in good spirits today. Pt answered questions appropriately, however, due to dementia, unsure of accuracy of answers.   Pt is unsure if he had breakfast this morning. He reports good appetite PTA, consuming 3 meals per day (Breakfast: juice, bacon, and eggs; Lunch: sandwich; Dinner: all different things"). Pt denies any difficulty chewing or swallowing.   Pt unsure if he has lost weight. Per wt hx; wt has been stable over the past year.   Labs reviewed: CBGS: 93-186 (inpatient orders for glycemic control are none).   NUTRITION - FOCUSED PHYSICAL EXAM:    Most Recent Value  Orbital Region Moderate depletion  Upper Arm Region Mild depletion  Thoracic and Lumbar Region No depletion  Buccal Region No depletion  Temple Region Mild depletion  Clavicle Bone Region No depletion  Clavicle and Acromion Bone Region No depletion  Scapular Bone Region No depletion  Dorsal Hand Mild  depletion  Patellar Region Mild depletion  Anterior Thigh Region Mild depletion  Posterior Calf Region Mild depletion  Edema (RD Assessment) None  Hair Reviewed  Eyes Reviewed  Mouth Reviewed  Skin Reviewed  Nails Reviewed       Diet Order:   Diet Order            Diet regular Room service appropriate? Yes; Fluid consistency: Thin  Diet effective now                 EDUCATION NEEDS:   No education needs have been identified at this time  Skin:  Skin Assessment: Skin Integrity Issues: Skin Integrity Issues:: Other (Comment), Incisions Incisions: rt hip Other: skin tears to rt and lt elbows  Last BM:  03/20/20  Height:   Ht Readings from Last 1 Encounters:  03/22/20 5\' 10"  (1.778 m)    Weight:   Wt Readings from Last 1 Encounters:  03/22/20 70 kg    Ideal Body Weight:  75.5 kg  BMI:  Body mass index is 22.14 kg/m.  Estimated Nutritional Needs:   Kcal:  1900-2100  Protein:  90-105 grams  Fluid:  > 1.9 L    Loistine Chance, RD, LDN, [place] Registered Dietitian II Certified Diabetes Care and Education Specialist Please refer to [facility] for RD and/or RD on-call/weekend/after hours pager

## 2020-03-23 NOTE — Progress Notes (Signed)
Patient is postop day 1 status post right intramedullary rodding of the intertrochanteric hip fracture.  He is lying in bed appears comfortable.   Vital signs stable dressing clean dry and in place.  Compartments soft and nontender   Postop day 1 status post above.  Patient may be weightbearing as tolerated for transfers.  Does not need gait training given his advanced dementia.  Plan would be for return to skilled nursing when medically stable will need follow-up in our office in 1 week

## 2020-03-23 NOTE — Progress Notes (Signed)
PROGRESS NOTE    [name] [name]  JOA:[id number] DOB: 17-Apr-1926 DOA: 03/21/2020 PCP: [name], MD    Brief Narrative:  86 year old male with past medical history notable for essential hypertension, hyperlipidemia, dementia, paroxysmal atrial fibrillation, CAD, history of lower GI bleed who presented to the ED following a fall.  No loss of consciousness reported, his son was able to get him up.  At baseline, patient ambulates with a cane.  In the ED, patient was noted to have a mild AKI with right hip fracture  Assessment & Plan:   Principal Problem:   Hip fracture [facility]) Active Problems:   Permanent atrial fibrillation ([place])   Coronary artery disease involving native coronary artery of native heart without angina pectoris   Alzheimer's disease ([place])   Hypercholesterolemia   AAA (abdominal aortic aneurysm) without rupture ([place])   HTN (hypertension)   DNR (do not resuscitate)   Acquired thrombophilia ([place])   Right intertrochanteric hip fracture Patient presenting to the ED following mechanical fall in the bathroom.  CT head/C-spine with no acute findings but with stable atrophy and diffuse osteoporosis and stable 3 mm anterolisthesis C7 on T1.  Right hip x-ray with right femoral intratrochanteric fracture. --Orthopedics following, appreciate assistance --s/p surgery on 9/21 --continue with analgesic as needed --Therapy recs for SNF, pending placement --Postoperative DVT prophylaxis per orthopedics  Dementia --Donepezil 5 mg p.o. twice daily --seems stable but confused this AM  Anxiety/depression: Continue sertraline 25 mg p.o. daily   Hyperlipidemia: Continue atorvastatin 40 mg p.o. daily as tolerated  BPH: Continue finasteride 5 mg p.o. daily  Permanent atrial fibrillation Rate controlled without medication, has pacer in place.  His home anticoagulation was stopped due to falls and GI bleeding. --Continue aspirin 81 mg p.o. daily as tolerated  History of  essential hypertension No longer on antihypertensive therapy.  BP 113/79, well controlled. --Continue monitor blood pressure closely  CAD/AAA Not a candidate for surgical repair. --Continue aspirin as tolerated   DVT prophylaxis: SCD's Code Status: DNR Family Communication: Pt in room, family not at bedside  Status is: Inpatient  Remains inpatient appropriate because:Hemodynamically unstable and Unsafe d/c plan   Dispo: The patient is from: Home              Anticipated d/c is to: SNF              Anticipated d/c date is: 2 days              Patient currently is not medically stable to d/c.       Consultants:   Orthopedic Surgery  Procedures:   Intramedullary nail intertrochanteric R hip 9/21  Antimicrobials: Anti-infectives (From admission, onward)   Start     Dose/Rate Route Frequency Ordered Stop   03/23/20 0500  ceFAZolin (ANCEF) IVPB 2g/100 mL premix        2 g 200 mL/hr over 30 Minutes Intravenous  Once 03/22/20 1902 03/23/20 0524   03/22/20 0600  ceFAZolin (ANCEF) IVPB 2g/100 mL premix        2 g 200 mL/hr over 30 Minutes Intravenous On call to O.R. 03/22/20 0140 03/22/20 1653      Subjective: Without complaints this AM  Objective: Vitals:   03/23/20 0446 03/23/20 0535 03/23/20 0757 03/23/20 1300  BP: (!) 95/53 (!) 97/56 (!) 100/52 105/62  Pulse: 60  (!) 59 62  Resp: 16  16 16   Temp: 97.6 F (36.4 C)  98 F (36.7 C) 98.2 F (36.8 C)  TempSrc: Oral   Oral  SpO2: 96%  93% 94%  Weight:      Height:        Intake/Output Summary (Last 24 hours) at 03/23/2020 1724 Last data filed at 03/23/2020 1300 Gross per 24 hour  Intake 3035.9 ml  Output 450 ml  Net 2585.9 ml   Filed Weights   03/21/20 1200 03/22/20 1620  Weight: 70 kg 70 kg    Examination:  General exam: Appears calm and comfortable  Respiratory system: Clear to auscultation. Respiratory effort normal. Cardiovascular system: S1 & S2 heard, regular Gastrointestinal system:  Abdomen is nondistended, soft and nontender. No organomegaly or masses felt. Normal bowel sounds heard. Central nervous system: Alert . No focal neurological deficits. Extremities: Symmetric 5 x 5 power. Skin: No rashes, lesions  Psychiatry: seems confused  Data Reviewed: I have personally reviewed following labs and imaging studies  CBC: Recent Labs  Lab 03/21/20 1202 03/22/20 0820 03/23/20 0958  WBC 6.3 9.8 11.1*  NEUTROABS 3.8  --   --   HGB 11.5* 9.9* 8.7*  HCT 37.4* 32.3* 28.4*  MCV 101.9* 99.1 100.7*  PLT 209 195 431   Basic Metabolic Panel: Recent Labs  Lab 03/21/20 1202 03/22/20 0820 03/23/20 0958  NA 139 139 136  K 4.5 4.8 3.9  CL 105 106 103  CO2 23 23 25   GLUCOSE 88 110* 137*  BUN 28* 30* 32*  CREATININE 1.56* 1.56* 1.72*  CALCIUM 9.2 9.1 8.6*   GFR: Estimated Creatinine Clearance: 27.1 mL/min (A) (by C-G formula based on SCr of 1.72 mg/dL (H)). Liver Function Tests: No results for input(s): AST, ALT, ALKPHOS, BILITOT, PROT, ALBUMIN in the last 168 hours. No results for input(s): LIPASE, AMYLASE in the last 168 hours. No results for input(s): AMMONIA in the last 168 hours. Coagulation Profile: Recent Labs  Lab 03/21/20 1202  INR 1.2   Cardiac Enzymes: No results for input(s): CKTOTAL, CKMB, CKMBINDEX, TROPONINI in the last 168 hours. BNP (last 3 results) No results for input(s): PROBNP in the last 8760 hours. HbA1C: No results for input(s): HGBA1C in the last 72 hours. CBG: Recent Labs  Lab 03/22/20 1153 03/22/20 2107 03/23/20 0615 03/23/20 1229 03/23/20 1614  GLUCAP 93 186* 139* 324* 131*   Lipid Profile: No results for input(s): CHOL, HDL, LDLCALC, TRIG, CHOLHDL, LDLDIRECT in the last 72 hours. Thyroid Function Tests: No results for input(s): TSH, T4TOTAL, FREET4, T3FREE, THYROIDAB in the last 72 hours. Anemia Panel: No results for input(s): VITAMINB12, FOLATE, FERRITIN, TIBC, IRON, RETICCTPCT in the last 72 hours. Sepsis Labs: No  results for input(s): PROCALCITON, LATICACIDVEN in the last 168 hours.  Recent Results (from the past 240 hour(s))  SARS Coronavirus 2 by RT PCR (hospital order, performed in [facility] hospital lab) Nasopharyngeal Nasopharyngeal Swab     Status: None   Collection Time: 03/21/20  2:02 PM   Specimen: Nasopharyngeal Swab  Result Value Ref Range Status   SARS Coronavirus 2 NEGATIVE NEGATIVE Final    Comment: (NOTE) SARS-CoV-2 target nucleic acids are NOT DETECTED.  The SARS-CoV-2 RNA is generally detectable in upper and lower respiratory specimens during the acute phase of infection. The lowest concentration of SARS-CoV-2 viral copies this assay can detect is 250 copies / mL. A negative result does not preclude SARS-CoV-2 infection and should not be used as the sole basis for treatment or other patient management decisions.  A negative result may occur with improper specimen collection / handling, submission of specimen other than  nasopharyngeal swab, presence of viral mutation(s) within the areas targeted by this assay, and inadequate number of viral copies (<250 copies / mL). A negative result must be combined with clinical observations, patient history, and epidemiological information.  Fact Sheet for Patients:   StrictlyIdeas.no  Fact Sheet for Healthcare Providers: BankingDealers.co.za  This test is not yet approved or  cleared by the [place] FDA and has been authorized for detection and/or diagnosis of SARS-CoV-2 by FDA under an Emergency Use Authorization (EUA).  This EUA will remain in effect (meaning this test can be used) for the duration of the COVID-19 declaration under Section 564(b)(1) of the Act, 21 U.S.C. section 360bbb-3(b)(1), unless the authorization is terminated or revoked sooner.  Performed at [facility], [place] [address], [place], [place] [address]   Surgical pcr screen     Status: None    Collection Time: 03/22/20 12:52 AM   Specimen: Nasal Mucosa; Nasal Swab  Result Value Ref Range Status   MRSA, PCR NEGATIVE NEGATIVE Final   Staphylococcus aureus NEGATIVE NEGATIVE Final    Comment: (NOTE) The Xpert SA Assay (FDA approved for NASAL specimens in patients 89 years of age and older), is one component of a comprehensive surveillance program. It is not intended to diagnose infection nor to guide or monitor treatment. Performed at [facility], [place] [address], [place], [place] [address]      Radiology Studies: DG C-Arm 1-60 Min-No Report  Result Date: 03/22/2020 Fluoroscopy was utilized by the requesting physician.  No radiographic interpretation.    Scheduled Meds: . aspirin EC  325 mg Oral Q breakfast  . atorvastatin  40 mg Oral QPM  . docusate sodium  100 mg Oral BID  . donepezil  5 mg Oral BID  . feeding supplement (ENSURE ENLIVE)  237 mL Oral BID BM  . finasteride  5 mg Oral QPM  . multivitamin with minerals  1 tablet Oral Daily  . sertraline  25 mg Oral Daily   Continuous Infusions: . methocarbamol (ROBAXIN) IV       LOS: 2 days   Marylu Lund, MD [facility] Pager On Amion  If 7PM-7AM, please contact night-coverage 03/23/2020, 5:24 PM

## 2020-03-23 NOTE — TOC CAGE-AID Note (Signed)
Transition of Care [facility]) - CAGE-AID Screening   Patient Details  Name: [name] [name] MRN: [id number] Date of Birth: 08/01/1925  Transition of Care [facility]) CM/SW Contact:    Emeterio Reeve, [place] Phone Number: 03/23/2020, 10:31 AM   Clinical Narrative: Pt was unable to participate in assessment due to only being oriented to person.    CAGE-AID Screening: Substance Abuse Screening unable to be completed due to: : Patient unable to participate            [place] Clinical Social Worker [phone]

## 2020-03-23 NOTE — TOC Initial Note (Signed)
Transition of Care [facility]) - Initial/Assessment Note    Patient Details  Name: [name] [name] MRN: [id number] Date of Birth: 04-May-1926  Transition of Care [facility]) CM/SW Contact:    Sharin Mons, RN Phone Number: [phone] 03/23/2020, 4:28 PM  Clinical Narrative:       Admitted with R hip fx, hx of dementia.   From home with wife. Supportive family. Hx of dementia.     -s/p IM nailing to R hip, 9/21  RNCM received consult for possible SNF placement at time of discharge. RNCM spoke with patient's wife regarding PT recommendation of SNF placement at time of discharge. Wife  reported that she is currently unable to care for patient at their home given patient's current physical needs and fall risk. Wife expressed understanding of PT recommendation and is agreeable to SNF placement at time of discharge. Reports preference is for [facility]. States daughter works there and they will have a bed ready for him @ d/c. NCM left voice message with daughter to confirm ... awaiting call back.  RNCM discussed insurance authorization process and provided Medicare SNF ratings list to wife. No further questions reported at this time. RNCM to continue to follow and assist with discharge planning needs.   Expected Discharge Plan: Skilled Nursing Facility Barriers to Discharge: Continued Medical Work up   Patient Goals and CMS Choice   CMS Medicare.gov Compare Post Acute Care list provided to:: Patient Represenative (must comment) (wife)    Expected Discharge Plan and Services Expected Discharge Plan: [place]                                              Prior Living Arrangements/Services   Lives with:: Spouse                   Activities of Daily Living Home Assistive Devices/Equipment: Cane (specify quad or straight) ADL Screening (condition at time of admission) Patient's cognitive ability adequate to safely complete daily activities?:  Yes Is the patient deaf or have difficulty hearing?: No Does the patient have difficulty seeing, even when wearing glasses/contacts?: No Does the patient have difficulty concentrating, remembering, or making decisions?: Yes Patient able to express need for assistance with ADLs?: Yes Does the patient have difficulty dressing or bathing?: Yes Independently performs ADLs?: No Communication: Independent Dressing (OT): Needs assistance Is this a change from baseline?: Pre-admission baseline Grooming: Needs assistance Is this a change from baseline?: Pre-admission baseline Feeding: Independent Bathing: Needs assistance Is this a change from baseline?: Pre-admission baseline Toileting: Needs assistance Is this a change from baseline?: Pre-admission baseline In/Out Bed: Needs assistance Is this a change from baseline?: Pre-admission baseline Walks in Home: Independent with device (comment) Does the patient have difficulty walking or climbing stairs?: Yes Weakness of Legs: Both Weakness of Arms/Hands: None  Permission Sought/Granted                  Emotional Assessment              Admission diagnosis:  Dehydration [E86.0] Hip fracture ([place]) [S72.009A] Fall [W19.XXXA] Fall, initial encounter [id number]] Closed fracture of right hip, initial encounter [facility]) [S72.001A] Patient Active Problem List   Diagnosis Date Noted  . Hip fracture ([place]) 03/21/2020  . DNR (do not resuscitate) 03/21/2020  . Urinary urgency 01/01/2019  . Candidiasis 01/01/2019  . Closed  displaced intertrochanteric fracture of left femur ([place]) 12/25/2018  . Closed intertrochanteric fracture of hip, left, initial encounter ([place]) 12/25/2018  . Pain due to onychomycosis of toenails of both feet 12/12/2018  . Acute blood loss anemia 06/08/2018  . GI bleed 06/05/2018  . HTN (hypertension)   . High cholesterol   . Diverticulosis   . Lower GI bleed   . AAA (abdominal aortic aneurysm) without rupture ([place])  01/09/2018  . Ascending aortic aneurysm ([place]) 01/09/2018  . Heart block AV second degree 07/15/2017  . Status post three vessel coronary artery bypass 07/15/2017  . Left ventricular systolic dysfunction without heart failure 05/11/2017  . Hypercholesterolemia 05/11/2017  . CHB (complete heart block) ([place]) 06/07/2015  . Alzheimer's disease ([place]) 01/26/2015  . Pacemaker 01/26/2013  . Permanent atrial fibrillation ([place]) 01/26/2013  . Second degree AV block, Mobitz type II 01/26/2013  . Coronary artery disease involving native coronary artery of native heart without angina pectoris 01/26/2013  . Dyspnea 01/26/2013   PCP:  [name], MD Pharmacy:   [facility], [place] [address] [place] [address] Phone: [phone] Fax: [place], [place]. [place] [address] Phone: [phone] Fax: [phone]     Social Determinants of Health (SDOH) Interventions    Readmission Risk Interventions No flowsheet data found.

## 2020-03-23 NOTE — Progress Notes (Signed)
BP at 0020 82/56 MD on call aware 570mL bolus ordered and administered, recheck BP 90/61. Will continue to monitor.

## 2020-03-23 NOTE — Anesthesia Postprocedure Evaluation (Signed)
Anesthesia Post Note  Patient: [name] [name]  Procedure(s) Performed: INTRAMEDULLARY (IM) NAIL INTERTROCHANTRIC (Right )     Patient location during evaluation: PACU Anesthesia Type: General Level of consciousness: awake and alert Pain management: pain level controlled Vital Signs Assessment: post-procedure vital signs reviewed and stable Respiratory status: spontaneous breathing, nonlabored ventilation, respiratory function stable and patient connected to nasal cannula oxygen Cardiovascular status: blood pressure returned to baseline and stable Postop Assessment: no apparent nausea or vomiting Anesthetic complications: no   No complications documented.  Last Vitals:  Vitals:   03/23/20 0757 03/23/20 1300  BP: (!) 100/52 105/62  Pulse: (!) 59 62  Resp: 16 16  Temp: 36.7 C 36.8 C  SpO2: 93% 94%    Last Pain:  Vitals:   03/23/20 1300  TempSrc: Oral  PainSc:                  Colon

## 2020-03-23 NOTE — Evaluation (Signed)
Physical Therapy Evaluation Patient Details Name: [name] [name] MRN: [id number] DOB: [birth year] Today's Date: 03/23/2020   History of Present Illness  Pt is a 86 y.o. M with significant PMH of atrial fibrillation, CAD, dementia,left hip fracture s/p IMN (2020) who presents with acute intertrochanteric hip fracture now s/p intramedullary nailing 03/22/2020.  Clinical Impression  Prior to admission, pt lives with his spouse, uses a cane for mobility, and is independent with ADL's. Pt with history of cognitive impairment, but able to follow one step commands during evaluation. Pt presents with decreased functional mobility secondary to pain, RLE weakness, and balance deficits. Requiring moderate assist for bed mobility and transfers to standing. Unable to take steps, so transfer to chair deferred. Recommend post acute rehab to maximize functional mobility and decrease caregiver burden.     Follow Up Recommendations SNF    Equipment Recommendations  Wheelchair (measurements PT);Wheelchair cushion (measurements PT)    Recommendations for Other Services       Precautions / Restrictions Precautions Precautions: Fall Restrictions Weight Bearing Restrictions: Yes RLE Weight Bearing: Weight bearing as tolerated      Mobility  Bed Mobility Overal bed mobility: Needs Assistance Bed Mobility: Supine to Sit;Sit to Supine     Supine to sit: Mod assist Sit to supine: Mod assist   General bed mobility comments: ModA for RLE negotiation out of bed, use of bed pad to scoot hips forward to edge of bed. Pt able to manage trunk with cues for use of bed rail. ModA for BLE negotiation back into bed; totalA + 2 for boost up in the bed to reposition  Transfers Overall transfer level: Needs assistance Equipment used: Rolling walker (2 wheeled) Transfers: Sit to/from Stand Sit to Stand: Mod assist         General transfer comment: ModA to rise from elevated bed height with cues for hand  positioning. Pt unable to take side steps  Ambulation/Gait                Stairs            Wheelchair Mobility    Modified Rankin (Stroke Patients Only)       Balance Overall balance assessment: Needs assistance Sitting-balance support: Feet supported Sitting balance-Leahy Scale: Fair     Standing balance support: Bilateral upper extremity supported Standing balance-Leahy Scale: Poor                               Pertinent Vitals/Pain Pain Assessment: Faces Faces Pain Scale: Hurts even more Pain Location: R hip with mobility Pain Descriptors / Indicators: Grimacing;Operative site guarding Pain Intervention(s): Limited activity within patient's tolerance;Monitored during session    [place] expects to be discharged to:: Private residence Living Arrangements: Spouse/significant other Available Help at Discharge: Family Type of Home: House Home Access: Stairs to enter   Technical brewer of Steps: 4 Home Layout: Able to live on main level with bedroom/bathroom;Two level Home Equipment: Walker - 2 wheels;Cane - single point;Bedside commode;Tub bench Additional Comments: 1/2 bath on main floor, full bath on second floor    Prior Function Level of Independence: Needs assistance   Gait / Transfers Assistance Needed: uses cane   ADL's / Homemaking Assistance Needed: supervision for shower transfers  Comments: Uses BSC at night     Hand Dominance        Extremity/Trunk Assessment   Upper Extremity Assessment Upper Extremity Assessment: Defer to OT  evaluation    Lower Extremity Assessment Lower Extremity Assessment: RLE deficits/detail;LLE deficits/detail RLE Deficits / Details: Unable to perform heel slide; able to perform quad set. Ankle dorsiflexion WFL LLE Deficits / Details: WFL    Cervical / Trunk Assessment Cervical / Trunk Assessment: Kyphotic  Communication   Communication: No difficulties   Cognition Arousal/Alertness: Awake/alert Behavior During Therapy: WFL for tasks assessed/performed Overall Cognitive Status: History of cognitive impairments - at baseline                                 General Comments: History of dementia. Pt pleasant, follows 1 step commands      General Comments      Exercises General Exercises - Lower Extremity Ankle Circles/Pumps: Both;10 reps;Supine Quad Sets: Right;10 reps;Supine Heel Slides: AAROM;Right;10 reps;Supine   Assessment/Plan    PT Assessment Patient needs continued PT services  PT Problem List Decreased strength;Decreased activity tolerance;Decreased mobility;Decreased balance;Decreased cognition;Decreased safety awareness;Pain       PT Treatment Interventions DME instruction;Gait training;Functional mobility training;Therapeutic activities;Therapeutic exercise;Balance training;Patient/family education    PT Goals (Current goals can be found in the Care Plan section)  Acute Rehab PT Goals Patient Stated Goal: pt wife agreeable to rehab PT Goal Formulation: With patient Time For Goal Achievement: 04/06/20 Potential to Achieve Goals: Good    Frequency Min 3X/week   Barriers to discharge        Co-evaluation               AM-PAC PT "6 Clicks" Mobility  Outcome Measure Help needed turning from your back to your side while in a flat bed without using bedrails?: A Little Help needed moving from lying on your back to sitting on the side of a flat bed without using bedrails?: A Lot Help needed moving to and from a bed to a chair (including a wheelchair)?: A Lot Help needed standing up from a chair using your arms (e.g., wheelchair or bedside chair)?: A Lot Help needed to walk in hospital room?: A Lot Help needed climbing 3-5 steps with a railing? : Total 6 Click Score: 12    End of Session Equipment Utilized During Treatment: Gait belt Activity Tolerance: Patient limited by pain Patient left: in  bed;with call bell/phone within reach;with bed alarm set Nurse Communication: Mobility status;Other (comment) (incisional drainage) PT Visit Diagnosis: Pain;Difficulty in walking, not elsewhere classified (R26.2);Other abnormalities of gait and mobility (R26.89) Pain - Right/Left: Right Pain - part of body: Hip    Time: 8177-1165 PT Time Calculation (min) (ACUTE ONLY): 26 min   Charges:   PT Evaluation $PT Eval Moderate Complexity: 1 Mod PT Treatments $Therapeutic Activity: 8-22 mins          Wyona Almas, PT, DPT Acute Rehabilitation Services Pager [phone] Office [phone]   Deno Etienne 03/23/2020, 1:44 PM

## 2020-03-24 DIAGNOSIS — R601 Generalized edema: Secondary | ICD-10-CM | POA: Diagnosis not present

## 2020-03-24 DIAGNOSIS — G301 Alzheimer's disease with late onset: Secondary | ICD-10-CM | POA: Diagnosis not present

## 2020-03-24 DIAGNOSIS — R41841 Cognitive communication deficit: Secondary | ICD-10-CM | POA: Diagnosis not present

## 2020-03-24 DIAGNOSIS — M2550 Pain in unspecified joint: Secondary | ICD-10-CM | POA: Diagnosis not present

## 2020-03-24 DIAGNOSIS — F0281 Dementia in other diseases classified elsewhere with behavioral disturbance: Secondary | ICD-10-CM | POA: Diagnosis not present

## 2020-03-24 DIAGNOSIS — Z85828 Personal history of other malignant neoplasm of skin: Secondary | ICD-10-CM | POA: Diagnosis not present

## 2020-03-24 DIAGNOSIS — W19XXXA Unspecified fall, initial encounter: Secondary | ICD-10-CM | POA: Diagnosis not present

## 2020-03-24 DIAGNOSIS — R946 Abnormal results of thyroid function studies: Secondary | ICD-10-CM | POA: Diagnosis not present

## 2020-03-24 DIAGNOSIS — L570 Actinic keratosis: Secondary | ICD-10-CM | POA: Diagnosis not present

## 2020-03-24 DIAGNOSIS — I714 Abdominal aortic aneurysm, without rupture: Secondary | ICD-10-CM | POA: Diagnosis not present

## 2020-03-24 DIAGNOSIS — I10 Essential (primary) hypertension: Secondary | ICD-10-CM | POA: Diagnosis not present

## 2020-03-24 DIAGNOSIS — Z950 Presence of cardiac pacemaker: Secondary | ICD-10-CM | POA: Diagnosis not present

## 2020-03-24 DIAGNOSIS — R3915 Urgency of urination: Secondary | ICD-10-CM | POA: Diagnosis not present

## 2020-03-24 DIAGNOSIS — F339 Major depressive disorder, recurrent, unspecified: Secondary | ICD-10-CM | POA: Diagnosis not present

## 2020-03-24 DIAGNOSIS — I959 Hypotension, unspecified: Secondary | ICD-10-CM | POA: Diagnosis not present

## 2020-03-24 DIAGNOSIS — E860 Dehydration: Secondary | ICD-10-CM | POA: Diagnosis not present

## 2020-03-24 DIAGNOSIS — Z7401 Bed confinement status: Secondary | ICD-10-CM | POA: Diagnosis not present

## 2020-03-24 DIAGNOSIS — M6281 Muscle weakness (generalized): Secondary | ICD-10-CM | POA: Diagnosis not present

## 2020-03-24 DIAGNOSIS — Z9181 History of falling: Secondary | ICD-10-CM | POA: Diagnosis not present

## 2020-03-24 DIAGNOSIS — I719 Aortic aneurysm of unspecified site, without rupture: Secondary | ICD-10-CM | POA: Diagnosis not present

## 2020-03-24 DIAGNOSIS — I4821 Permanent atrial fibrillation: Secondary | ICD-10-CM | POA: Diagnosis not present

## 2020-03-24 DIAGNOSIS — I2510 Atherosclerotic heart disease of native coronary artery without angina pectoris: Secondary | ICD-10-CM | POA: Diagnosis not present

## 2020-03-24 DIAGNOSIS — G309 Alzheimer's disease, unspecified: Secondary | ICD-10-CM | POA: Diagnosis not present

## 2020-03-24 DIAGNOSIS — R1310 Dysphagia, unspecified: Secondary | ICD-10-CM | POA: Diagnosis not present

## 2020-03-24 DIAGNOSIS — F0390 Unspecified dementia without behavioral disturbance: Secondary | ICD-10-CM | POA: Diagnosis not present

## 2020-03-24 DIAGNOSIS — R2681 Unsteadiness on feet: Secondary | ICD-10-CM | POA: Diagnosis not present

## 2020-03-24 DIAGNOSIS — Z23 Encounter for immunization: Secondary | ICD-10-CM | POA: Diagnosis not present

## 2020-03-24 DIAGNOSIS — N1832 Chronic kidney disease, stage 3b: Secondary | ICD-10-CM | POA: Diagnosis not present

## 2020-03-24 DIAGNOSIS — L821 Other seborrheic keratosis: Secondary | ICD-10-CM | POA: Diagnosis not present

## 2020-03-24 DIAGNOSIS — E782 Mixed hyperlipidemia: Secondary | ICD-10-CM | POA: Diagnosis not present

## 2020-03-24 DIAGNOSIS — S72001A Fracture of unspecified part of neck of right femur, initial encounter for closed fracture: Secondary | ICD-10-CM | POA: Diagnosis not present

## 2020-03-24 DIAGNOSIS — L905 Scar conditions and fibrosis of skin: Secondary | ICD-10-CM | POA: Diagnosis not present

## 2020-03-24 DIAGNOSIS — R059 Cough, unspecified: Secondary | ICD-10-CM | POA: Diagnosis not present

## 2020-03-24 DIAGNOSIS — K219 Gastro-esophageal reflux disease without esophagitis: Secondary | ICD-10-CM | POA: Diagnosis not present

## 2020-03-24 DIAGNOSIS — R29898 Other symptoms and signs involving the musculoskeletal system: Secondary | ICD-10-CM | POA: Diagnosis not present

## 2020-03-24 DIAGNOSIS — S72142D Displaced intertrochanteric fracture of left femur, subsequent encounter for closed fracture with routine healing: Secondary | ICD-10-CM | POA: Diagnosis not present

## 2020-03-24 DIAGNOSIS — E039 Hypothyroidism, unspecified: Secondary | ICD-10-CM | POA: Diagnosis not present

## 2020-03-24 DIAGNOSIS — S72145D Nondisplaced intertrochanteric fracture of left femur, subsequent encounter for closed fracture with routine healing: Secondary | ICD-10-CM | POA: Diagnosis not present

## 2020-03-24 DIAGNOSIS — S72001D Fracture of unspecified part of neck of right femur, subsequent encounter for closed fracture with routine healing: Secondary | ICD-10-CM | POA: Diagnosis not present

## 2020-03-24 DIAGNOSIS — Z8781 Personal history of (healed) traumatic fracture: Secondary | ICD-10-CM | POA: Diagnosis not present

## 2020-03-24 DIAGNOSIS — S72142S Displaced intertrochanteric fracture of left femur, sequela: Secondary | ICD-10-CM | POA: Diagnosis not present

## 2020-03-24 DIAGNOSIS — R1312 Dysphagia, oropharyngeal phase: Secondary | ICD-10-CM | POA: Diagnosis not present

## 2020-03-24 DIAGNOSIS — R5381 Other malaise: Secondary | ICD-10-CM | POA: Diagnosis not present

## 2020-03-24 DIAGNOSIS — E7800 Pure hypercholesterolemia, unspecified: Secondary | ICD-10-CM | POA: Diagnosis not present

## 2020-03-24 DIAGNOSIS — F0280 Dementia in other diseases classified elsewhere without behavioral disturbance: Secondary | ICD-10-CM | POA: Diagnosis not present

## 2020-03-24 DIAGNOSIS — I442 Atrioventricular block, complete: Secondary | ICD-10-CM | POA: Diagnosis not present

## 2020-03-24 DIAGNOSIS — K5901 Slow transit constipation: Secondary | ICD-10-CM | POA: Diagnosis not present

## 2020-03-24 DIAGNOSIS — N184 Chronic kidney disease, stage 4 (severe): Secondary | ICD-10-CM | POA: Diagnosis not present

## 2020-03-24 DIAGNOSIS — I712 Thoracic aortic aneurysm, without rupture: Secondary | ICD-10-CM | POA: Diagnosis not present

## 2020-03-24 DIAGNOSIS — M25551 Pain in right hip: Secondary | ICD-10-CM | POA: Diagnosis not present

## 2020-03-24 DIAGNOSIS — D62 Acute posthemorrhagic anemia: Secondary | ICD-10-CM | POA: Diagnosis not present

## 2020-03-24 DIAGNOSIS — D508 Other iron deficiency anemias: Secondary | ICD-10-CM | POA: Diagnosis not present

## 2020-03-24 DIAGNOSIS — K5909 Other constipation: Secondary | ICD-10-CM | POA: Diagnosis not present

## 2020-03-24 DIAGNOSIS — I519 Heart disease, unspecified: Secondary | ICD-10-CM | POA: Diagnosis not present

## 2020-03-24 DIAGNOSIS — R410 Disorientation, unspecified: Secondary | ICD-10-CM | POA: Diagnosis not present

## 2020-03-24 DIAGNOSIS — R634 Abnormal weight loss: Secondary | ICD-10-CM | POA: Diagnosis not present

## 2020-03-24 LAB — CBC
HCT: 25.4 % — ABNORMAL LOW (ref 39.0–52.0)
Hemoglobin: 7.9 g/dL — ABNORMAL LOW (ref 13.0–17.0)
MCH: 30.9 pg (ref 26.0–34.0)
MCHC: 31.1 g/dL (ref 30.0–36.0)
MCV: 99.2 fL (ref 80.0–100.0)
Platelets: 151 10*3/uL (ref 150–400)
RBC: 2.56 MIL/uL — ABNORMAL LOW (ref 4.22–5.81)
RDW: 14.4 % (ref 11.5–15.5)
WBC: 10.1 10*3/uL (ref 4.0–10.5)
nRBC: 0 % (ref 0.0–0.2)

## 2020-03-24 LAB — BASIC METABOLIC PANEL
Anion gap: 7 (ref 5–15)
BUN: 44 mg/dL — ABNORMAL HIGH (ref 8–23)
CO2: 23 mmol/L (ref 22–32)
Calcium: 8.6 mg/dL — ABNORMAL LOW (ref 8.9–10.3)
Chloride: 105 mmol/L (ref 98–111)
Creatinine, Ser: 1.71 mg/dL — ABNORMAL HIGH (ref 0.61–1.24)
GFR calc Af Amer: 39 mL/min — ABNORMAL LOW (ref >60)
GFR calc non Af Amer: 34 mL/min — ABNORMAL LOW (ref >60)
Glucose, Bld: 93 mg/dL (ref 70–99)
Potassium: 4.2 mmol/L (ref 3.5–5.1)
Sodium: 135 mmol/L (ref 135–145)

## 2020-03-24 MED ORDER — ASPIRIN 325 MG PO TBEC: 325.0000 mg | DELAYED_RELEASE_TABLET | Freq: Every day | ORAL | 0 refills | Status: AC

## 2020-03-24 MED ORDER — POLYETHYLENE GLYCOL 3350 17 G PO PACK: 17.0000 g | PACK | Freq: Every day | ORAL | 0 refills | Status: DC | PRN

## 2020-03-24 MED ORDER — HYDROCODONE-ACETAMINOPHEN 5-325 MG PO TABS
1.0000 | ORAL_TABLET | ORAL | 0 refills | Status: DC | PRN
Start: 2020-03-24 — End: 2020-05-17

## 2020-03-24 MED ORDER — BISACODYL 5 MG PO TBEC: 5.0000 mg | DELAYED_RELEASE_TABLET | Freq: Every day | ORAL | 0 refills | Status: DC | PRN

## 2020-03-24 MED ORDER — DOCUSATE SODIUM 100 MG PO CAPS: 100.0000 mg | ORAL_CAPSULE | Freq: Two times a day (BID) | ORAL | 0 refills | Status: DC

## 2020-03-24 NOTE — Plan of Care (Signed)

## 2020-03-24 NOTE — Plan of Care (Signed)
  Problem: Education: Goal: Knowledge of General Education information will improve Description: Including pain rating scale, medication(s)/side effects and non-pharmacologic comfort measures 03/24/2020 0053 by Cephus Shelling, RN Outcome: Progressing 03/24/2020 0052 by Cephus Shelling, RN Outcome: Progressing   Problem: Health Behavior/Discharge Planning: Goal: Ability to manage health-related needs will improve 03/24/2020 0053 by Cephus Shelling, RN Outcome: Progressing 03/24/2020 0052 by Cephus Shelling, RN Outcome: Progressing   Problem: Activity: Goal: Risk for activity intolerance will decrease 03/24/2020 0053 by Cephus Shelling, RN Outcome: Progressing 03/24/2020 0052 by Cephus Shelling, RN Outcome: Progressing   Problem: Nutrition: Goal: Adequate nutrition will be maintained 03/24/2020 0053 by Cephus Shelling, RN Outcome: Progressing 03/24/2020 0052 by Cephus Shelling, RN Outcome: Progressing   Problem: Elimination: Goal: Will not experience complications related to bowel motility Outcome: Progressing   Problem: Pain Managment: Goal: General experience of comfort will improve Outcome: Progressing   Problem: Safety: Goal: Ability to remain free from injury will improve 03/24/2020 0053 by Cephus Shelling, RN Outcome: Progressing 03/24/2020 0052 by Cephus Shelling, RN Outcome: Progressing   Problem: Skin Integrity: Goal: Risk for impaired skin integrity will decrease Outcome: Progressing

## 2020-03-24 NOTE — Evaluation (Signed)
 Occupational Therapy Evaluation Patient Details Name: [name] [name] MRN: [id number] DOB: 04/05/1926 Today's Date: 03/24/2020    History of Present Illness Pt is a 86 y.o. M with significant PMH of atrial fibrillation, CAD, dementia,left hip fracture s/p IMN (2020) who presents with acute intertrochanteric hip fracture now s/p intramedullary nailing 03/22/2020.   Clinical Impression   PTA, pt lives with wife and typically Modified Independent for dressing, toileting. Pt's wife provides supervision and assist for showering tasks as needed. Pt uses a cane for mobility. Pt presents now s/p surgery with deficits in strength, endurance, balance, pain, and cognition. Pt requires Mod A for sit to stand transfers with RW, unable to progress to taking steps during session. Pt requires Min A for UB ADLs and up to Total A for LB ADLs due to deficits. Recommend SNF for ST rehab prior to discharge home to maximize safety and independence.     Follow Up Recommendations  SNF;Supervision/Assistance - 24 hour    Equipment Recommendations  Other (comment) (defer to next venue)    Recommendations for Other Services       Precautions / Restrictions Precautions Precautions: Fall Restrictions Weight Bearing Restrictions: Yes RLE Weight Bearing: Weight bearing as tolerated      Mobility Bed Mobility Overal bed mobility: Needs Assistance Bed Mobility: Supine to Sit;Sit to Supine     Supine to sit: Max assist;HOB elevated Sit to supine: Max assist;+2 for physical assistance   General bed mobility comments: Max A for R LE advancement to EOB. cues for bed rail use but pt dificulty attending d/t focus on pain.   Transfers Overall transfer level: Needs assistance Equipment used: Rolling walker (2 wheeled) Transfers: Sit to/from Stand Sit to Stand: Mod assist;From elevated surface         General transfer comment: Pt Mod A from elevated bed with RW, but unable to stand for longer than 5 seconds.  Able to demo some shimmying of B feet EOB, but ultimately unable to take steps    Balance Overall balance assessment: Needs assistance;History of Falls Sitting-balance support: Bilateral upper extremity supported;Feet supported Sitting balance-Leahy Scale: Poor Sitting balance - Comments: requires UE support to maintain balance   Standing balance support: Bilateral upper extremity supported Standing balance-Leahy Scale: Poor Standing balance comment: reliant on external support                           ADL either performed or assessed with clinical judgement   ADL Overall ADL's : Needs assistance/impaired Eating/Feeding: Supervision/ safety;Sitting   Grooming: Supervision/safety;Sitting   Upper Body Bathing: Minimal assistance;Sitting   Lower Body Bathing: Maximal assistance;Sit to/from stand   Upper Body Dressing : Minimal assistance;Sitting   Lower Body Dressing: Total assistance;Sit to/from stand Lower Body Dressing Details (indicate cue type and reason): Pt Total A for adjustment of socks, unable to stand long enough for further LB dressing     Toileting- Clothing Manipulation and Hygiene: Bed level;Maximal assistance Toileting - Clothing Manipulation Details (indicate cue type and reason): Pt Total A for toileting at bed level with bowels. On OT entry, pt using urinal without major assistance        General ADL Comments: Pt with increased pain, decreased strength, endurance and cognition requiring extensive assist for LB ADls     Vision Baseline Vision/History: No visual deficits Patient Visual Report: No change from baseline Vision Assessment?: No apparent visual deficits Additional Comments: Pt able to read wall clock without  glassess and without difficulty      Perception     Praxis      Pertinent Vitals/Pain Pain Assessment: Faces Faces Pain Scale: Hurts whole lot Pain Location: R hip with mobility Pain Descriptors / Indicators:  Grimacing;Operative site guarding Pain Intervention(s): Limited activity within patient's tolerance;Monitored during session;Repositioned     Hand Dominance Right   Extremity/Trunk Assessment Upper Extremity Assessment Upper Extremity Assessment: Generalized weakness   Lower Extremity Assessment Lower Extremity Assessment: Defer to PT evaluation   Cervical / Trunk Assessment Cervical / Trunk Assessment: Kyphotic   Communication Communication Communication: No difficulties   Cognition Arousal/Alertness: Awake/alert Behavior During Therapy: WFL for tasks assessed/performed Overall Cognitive Status: History of cognitive impairments - at baseline Area of Impairment: Orientation;Memory;Following commands;Safety/judgement;Awareness;Problem solving                 Orientation Level: Place;Situation;Time   Memory: Decreased short-term memory;Decreased recall of precautions Following Commands: Follows one step commands with increased time Safety/Judgement: Decreased awareness of safety;Decreased awareness of deficits Awareness: Emergent Problem Solving: Slow processing;Difficulty sequencing;Requires verbal cues;Requires tactile cues General Comments: hx of dementia. Pleasant and able to follow one step commands. Decreased short term memory during functional tasks. A&Ox1. Required frequent reminders of fall and hip fx due to pt forgetting, often calling it "my bad leg"   General Comments  Pt noted with L elbow skin tear on arrival, no bandage in place presently. Wife entering at end of session and confirmed PLOF    Exercises     Shoulder Instructions      Home Living Family/patient expects to be discharged to:: Private residence Living Arrangements: Spouse/significant other Available Help at Discharge: Family Type of Home: House Home Access: Stairs to enter Technical  of Steps: 4   Home Layout: Able to live on main level with bedroom/bathroom;Two level      Bathroom Shower/Tub: Tub/shower unit         Home Equipment: Environmental consultant - 2 wheels;Cane - single point;Bedside commode;Tub bench   Additional Comments: 1/2 bath on main floor, full bath on second floor      Prior Functioning/Environment Level of Independence: Needs assistance  Gait / Transfers Assistance Needed: uses cane  ADL's / Homemaking Assistance Needed: Pt Modified Independent for dressing, toileting. Pt wife provides supervision for showering tasks and assists as needed   Comments: Uses BSC at night        OT Problem List: Decreased strength;Decreased activity tolerance;Impaired balance (sitting and/or standing);Decreased range of motion;Decreased cognition;Decreased coordination;Decreased safety awareness;Decreased knowledge of use of DME or AE;Pain      OT Treatment/Interventions: Self-care/ADL training;Therapeutic exercise;Energy conservation;DME and/or AE instruction;Therapeutic activities;Patient/family education    OT Goals(Current goals can be found in the care plan section) Acute Rehab OT Goals Patient Stated Goal: go to rehab, get stronger and go home OT Goal Formulation: With patient/family Time For Goal Achievement: 04/07/20 Potential to Achieve Goals: Good ADL Goals Pt Will Perform Lower Body Bathing: with min assist;sit to/from stand;sitting/lateral leans Pt Will Perform Lower Body Dressing: with min assist;sit to/from stand;sitting/lateral leans Pt Will Transfer to Toilet: with mod assist;stand pivot transfer;bedside commode Pt Will Perform Toileting - Clothing Manipulation and hygiene: with min assist;sit to/from stand;sitting/lateral leans Pt/caregiver will Perform Home Exercise Program: Increased strength;Both right and left upper extremity;With theraband;With Supervision;With written HEP provided  OT Frequency: Min 2X/week   Barriers to D/C:            Co-evaluation  AM-PAC OT "6 Clicks" Daily Activity     Outcome Measure Help  from another person eating meals?: A Little Help from another person taking care of personal grooming?: A Little Help from another person toileting, which includes using toliet, bedpan, or urinal?: A Lot Help from another person bathing (including washing, rinsing, drying)?: A Lot Help from another person to put on and taking off regular upper body clothing?: A Little Help from another person to put on and taking off regular lower body clothing?: Total 6 Click Score: 14   End of Session Equipment Utilized During Treatment: Gait belt;Rolling walker Nurse Communication: Mobility status  Activity Tolerance: Patient limited by pain Patient left: in bed;with call bell/phone within reach;with bed alarm set;with family/visitor present  OT Visit Diagnosis: Unsteadiness on feet (R26.81);Other abnormalities of gait and mobility (R26.89);Muscle weakness (generalized) (M62.81);History of falling (Z91.81);Other symptoms and signs involving cognitive function;Pain Pain - Right/Left: Right Pain - part of body: Hip                Time: 9244-6286 OT Time Calculation (min): 33 min Charges:  OT General Charges $OT Visit: 1 Visit OT Evaluation $OT Eval Moderate Complexity: 1 Mod OT Treatments $Self Care/Home Management : 8-22 mins  Layla Maw, OTR/L  Layla Maw 03/24/2020, 9:27 AM

## 2020-03-24 NOTE — Progress Notes (Addendum)
Patient is staus post Intertroch Hip Fracture ORIF   Sitting in bed eating breakfast .    VSS dressing clean dry and intact  A/P Stable DC to SNF when medically stable. Follow up ortho 1 week   Norco prescription is printed and on chart

## 2020-03-24 NOTE — TOC Transition Note (Signed)
Transition of Care [facility]) - CM/SW Discharge Note   Patient Details  Name: BRANTON EINSTEIN MRN: [id number] Date of Birth: [birth year]  [facility] of [facility]) CM/SW Contact:  Sharin Mons, RN Phone Number: 03/24/2020, 10:04 AM   Clinical Narrative:    Patient will DC to: [place] Anticipated DC date: 03/24/2020 Family notified: wife , daughter Transport by: Corey Harold   Per MD patient ready for DC today .RN, patient, patient's family, and facility notified of DC. Discharge Summary and FL2 sent to facility. RN to call report prior to discharge [phone]).  Rm # [id number] , [facility].DC packet on chart. Ambulance transport requested for patient.   RNCM will sign off for now as intervention is no longer needed. Please consult [place] again if new needs arise.   Final next level of care: Skilled Nursing Facility Barriers to Discharge: No Barriers Identified   Patient Goals and CMS Choice   CMS Medicare.gov Compare Post Acute Care list provided to:: Patient Represenative (must comment) (wife)    Discharge Placement                       Discharge Plan and Services                                     Social Determinants of Health (SDOH) Interventions     Readmission Risk Interventions No flowsheet data found.

## 2020-03-24 NOTE — Discharge Summary (Signed)
Physician Discharge Summary  [name] [name] VZD:[id number] DOB: [birth year] DOA: 03/21/2020  PCP: [name], MD  Admit date: 03/21/2020 Discharge date: 03/24/2020  Admitted From: Home Disposition:  SNF  Recommendations for Outpatient Follow-up:  1. Follow up with PCP in 2-3 weeks 2. Follow up with Orthopedic Surgery as scheduled 3. Per Orthopedic Surgery, continue ASA 325mg  daily x 21 days (3 weeks), then transition back to previous 81mg  daily dose that pt was taking prior to admission  Discharge Condition:Stable CODE STATUS:DNR Diet recommendation: Regular   Brief/Interim Summary: 86 year old male with past medical history notable for essential hypertension, hyperlipidemia, dementia, paroxysmal atrial fibrillation, CAD, history of lower GI bleed who presented to the ED following a fall. No loss of consciousness reported, his son was able to get him up. At baseline, patient ambulates with a cane.  In the ED, patient was noted to have a mild AKI with right hip fracture  Discharge Diagnoses:  Principal Problem:   Hip fracture [facility]) Active Problems:   Permanent atrial fibrillation ([place])   Coronary artery disease involving native coronary artery of native heart without angina pectoris   Alzheimer's disease ([place])   Hypercholesterolemia   AAA (abdominal aortic aneurysm) without rupture ([place])   HTN (hypertension)   DNR (do not resuscitate)   Acquired thrombophilia ([place])  Right intertrochanteric hip fracture --Patient presenting to the ED following mechanical fall in the bathroom. CT head/C-spine with no acute findings but with stable atrophy and diffuse osteoporosis and stable 3 mm anterolisthesis C7 on T1. Right hip x-ray with right femoral intratrochanteric fracture. --Orthopedics following, appreciate assistance --s/p surgery on 9/21 --continue with analgesic as needed, prescribed by Orthopedic Surgery --Therapy recs for SNF --Postoperative DVT prophylaxis per orthopedics  per above  Dementia --Donepezil 5 mg p.o. twice daily --seems stable but confused this AM, at baseline  Anxiety/depression:Continue sertraline 25 mg p.o. daily   Hyperlipidemia:Continue atorvastatin 40 mg p.o. daily as tolerated  IRJ:JOACZYSA finasteride 5 mg p.o. daily  Permanent atrial fibrillation Rate controlled without medication, has pacer in place. His home anticoagulation was stopped due to falls and GI bleeding. --Continue aspirin as tolerated  History of essential hypertension No longer on antihypertensive therapy. BP 113/79, well controlled. --Continue monitor blood pressure closely  CAD/AAA Not a candidate for surgical repair. --Continue aspirin as tolerated  Discharge Instructions  Discharge Instructions    Weight bearing as tolerated   Complete by: As directed    No gait training. WBAT for transfers     Allergies as of 03/24/2020      Reactions   Amiodarone Other (See Comments)   Had a severe lung infection in 2003   Antihistamines, Diphenhydramine-type Other (See Comments)   Jittery      Medication List    TAKE these medications   acetaminophen 500 MG tablet Commonly known as: TYLENOL Take 1 tablet (500 mg total) by mouth every 6 (six) hours as needed for mild pain.   aspirin 325 MG EC tablet Take 1 tablet (325 mg total) by mouth daily with breakfast for 21 days. What changed:   medication strength  how much to take  when to take this   atorvastatin 40 MG tablet Commonly known as: LIPITOR Take 40 mg by mouth every evening.   bisacodyl 5 MG EC tablet Commonly known as: DULCOLAX Take 1 tablet (5 mg total) by mouth daily as needed for moderate constipation.   docusate sodium 100 MG capsule Commonly known as: COLACE Take 1 capsule (100 mg total) by  mouth 2 (two) times daily.   donepezil 5 MG tablet Commonly known as: ARICEPT Take 1 tablet (5 mg total) by mouth 2 (two) times daily.   ferrous sulfate 325 (65 FE) MG EC  tablet Take 325 mg by mouth 3 (three) times a week. Monday, Wednesday and Friday morning.   finasteride 5 MG tablet Commonly known as: PROSCAR Take 5 mg by mouth every evening.   HYDROcodone-acetaminophen 5-325 MG tablet Commonly known as: NORCO/VICODIN Take 1-2 tablets by mouth every 4 (four) hours as needed for moderate pain (pain score 4-6).   multivitamin with minerals Tabs tablet Take 1 tablet by mouth daily.   polyethylene glycol 17 g packet Commonly known as: MIRALAX / GLYCOLAX Take 17 g by mouth daily as needed for mild constipation.   sertraline 25 MG tablet Commonly known as: ZOLOFT Take 25 mg by mouth daily.   Vitamin D 50 MCG (2000 UT) Caps Take 2,000 Units by mouth at bedtime.            Discharge Care Instructions  (From admission, onward)         Start     Ordered   03/23/20 0000  Weight bearing as tolerated       Comments: No gait training. WBAT for transfers   03/23/20 4315          Follow-up Information    Newt Minion, MD In 1 week.   Specialty: Orthopedic Surgery Contact information: [place] [address] [phone]        [name], MD. Schedule an appointment as soon as possible for a visit in 2 week(s).   Specialty: Internal Medicine Contact information: [address] [place] [address] [phone]        [name], MD .   Specialty: Cardiology Contact information: [address] [place] [address] [phone]              Allergies  Allergen Reactions  . Amiodarone Other (See Comments)    Had a severe lung infection in 2003  . Antihistamines, Diphenhydramine-Type Other (See Comments)    Jittery    Consultations:  Orthopedic Surgery  Procedures/Studies: DG Chest 1 View  Result Date: 03/21/2020 CLINICAL DATA:  [place] and fractured right hip. EXAM: CHEST  1 VIEW COMPARISON:  Chest x-ray 12/25/2018 FINDINGS: The right atrial and right  ventricular pacer wires are in good position, unchanged. The heart is normal in size. Stable tortuosity and calcification of the thoracic aorta. Stable surgical changes from bypass surgery. Chronic bronchitic type interstitial lung changes but no acute pulmonary findings. No pleural effusion or pneumothorax. The bony thorax is grossly intact. IMPRESSION: Chronic lung changes but no acute pulmonary findings. Electronically Signed   By: Marijo Sanes M.D.   On: 03/21/2020 12:54   CT HEAD WO CONTRAST  Result Date: 03/21/2020 CLINICAL DATA:  Pain following fall EXAM: CT HEAD WITHOUT CONTRAST CT CERVICAL SPINE WITHOUT CONTRAST TECHNIQUE: Multidetector CT imaging of the head and cervical spine was performed following the standard protocol without intravenous contrast. Multiplanar CT image reconstructions of the cervical spine were also generated. COMPARISON:  CT head; CT cervical spine June 05, 2018 FINDINGS: CT HEAD FINDINGS Brain: Moderate diffuse atrophy is stable. There is no intracranial mass, hemorrhage, extra-axial fluid collection, or midline shift. There is patchy small vessel disease in the centra semiovale bilaterally. No acute infarct is appreciable. Vascular: No hyperdense vessel. There is calcification in each carotid siphon region.  Skull: Bony calvarium appears intact. Sinuses/Orbits: There is mucosal thickening in several ethmoid air cells. Other paranasal sinuses are clear. Orbits appear symmetric bilaterally. Other: Mastoid air cells are clear. CT CERVICAL SPINE FINDINGS Alignment: There is 3 mm of anterolisthesis of C7 on T1, stable. There is no new spondylolisthesis. Skull base and vertebrae: There is pannus posterior to the odontoid without impression on the craniocervical junction. There is extensive erosion in the odontoid, stable from prior study. Bones are osteoporotic. No acute fracture is appreciable. No blastic or lytic bone lesions are evident. Soft tissues and spinal canal:  Prevertebral soft tissues and predental space regions are normal. There is no appreciable cord or canal hematoma. No paraspinous lesions. Disc levels: There is severe disc space narrowing at C4-5, C5-6, and C7-T1. There is ankylosis at C6-7 and T1-2, stable. There is facet osteoarthritic change to varying degrees at all levels. Exit foraminal narrowing is noted at C3-4 bilaterally CT and at C5-6 bilaterally with impression on exiting nerve roots. A lesser degree of arthropathy is noted at other levels. No disc extrusion or high-grade stenosis. Note that there is moderate broad-based disc bulging at C5-6 and C6-7. Upper chest: There is emphysematous change in the upper lobes. No edema or consolidation noted in the upper lobe regions. Other: There are foci of subclavian and carotid artery calcification bilaterally. IMPRESSION: CT head: Stable atrophy with periventricular small vessel disease. No mass or hemorrhage. No extra-axial fluid collection. No acute infarct. There are foci of arterial vascular calcification. There is mucosal thickening in several ethmoid air cells. CT cervical spine: Diffuse osteoporosis. No fracture. Stable 3 mm of anterolisthesis of C7 on T1. No new spondylolisthesis. Multilevel arthropathy, essentially stable. Impression on exiting nerve roots, most severe at C3-4 and C5-6 bilaterally. No frank disc extrusion or high-grade stenosis. Underlying emphysematous change. Foci of subclavian and carotid artery calcification noted bilaterally. Electronically Signed   By: Lowella Grip III M.D.   On: 03/21/2020 13:29   CT CERVICAL SPINE WO CONTRAST  Result Date: 03/21/2020 CLINICAL DATA:  Pain following fall EXAM: CT HEAD WITHOUT CONTRAST CT CERVICAL SPINE WITHOUT CONTRAST TECHNIQUE: Multidetector CT imaging of the head and cervical spine was performed following the standard protocol without intravenous contrast. Multiplanar CT image reconstructions of the cervical spine were also generated.  COMPARISON:  CT head; CT cervical spine June 05, 2018 FINDINGS: CT HEAD FINDINGS Brain: Moderate diffuse atrophy is stable. There is no intracranial mass, hemorrhage, extra-axial fluid collection, or midline shift. There is patchy small vessel disease in the centra semiovale bilaterally. No acute infarct is appreciable. Vascular: No hyperdense vessel. There is calcification in each carotid siphon region. Skull: Bony calvarium appears intact. Sinuses/Orbits: There is mucosal thickening in several ethmoid air cells. Other paranasal sinuses are clear. Orbits appear symmetric bilaterally. Other: Mastoid air cells are clear. CT CERVICAL SPINE FINDINGS Alignment: There is 3 mm of anterolisthesis of C7 on T1, stable. There is no new spondylolisthesis. Skull base and vertebrae: There is pannus posterior to the odontoid without impression on the craniocervical junction. There is extensive erosion in the odontoid, stable from prior study. Bones are osteoporotic. No acute fracture is appreciable. No blastic or lytic bone lesions are evident. Soft tissues and spinal canal: Prevertebral soft tissues and predental space regions are normal. There is no appreciable cord or canal hematoma. No paraspinous lesions. Disc levels: There is severe disc space narrowing at C4-5, C5-6, and C7-T1. There is ankylosis at C6-7 and T1-2, stable. There is facet osteoarthritic  change to varying degrees at all levels. Exit foraminal narrowing is noted at C3-4 bilaterally CT and at C5-6 bilaterally with impression on exiting nerve roots. A lesser degree of arthropathy is noted at other levels. No disc extrusion or high-grade stenosis. Note that there is moderate broad-based disc bulging at C5-6 and C6-7. Upper chest: There is emphysematous change in the upper lobes. No edema or consolidation noted in the upper lobe regions. Other: There are foci of subclavian and carotid artery calcification bilaterally. IMPRESSION: CT head: Stable atrophy with  periventricular small vessel disease. No mass or hemorrhage. No extra-axial fluid collection. No acute infarct. There are foci of arterial vascular calcification. There is mucosal thickening in several ethmoid air cells. CT cervical spine: Diffuse osteoporosis. No fracture. Stable 3 mm of anterolisthesis of C7 on T1. No new spondylolisthesis. Multilevel arthropathy, essentially stable. Impression on exiting nerve roots, most severe at C3-4 and C5-6 bilaterally. No frank disc extrusion or high-grade stenosis. Underlying emphysematous change. Foci of subclavian and carotid artery calcification noted bilaterally. Electronically Signed   By: Lowella Grip III M.D.   On: 03/21/2020 13:29   DG C-Arm 1-60 Min-No Report  Result Date: 03/22/2020 Fluoroscopy was utilized by the requesting physician.  No radiographic interpretation.   DG Hip Unilat With Pelvis 2-3 Views Right  Result Date: 03/21/2020 CLINICAL DATA:  Fall, right hip pain EXAM: DG HIP (WITH OR WITHOUT PELVIS) 2-3V RIGHT COMPARISON:  12/25/2018 FINDINGS: Right femoral intertrochanteric fracture noted. Minimal displacement. No significant angulation. No no subluxation or dislocation. Hardware seen within the left proximal femur. IMPRESSION: Right femoral intertrochanteric fracture. Electronically Signed   By: Rolm Baptise M.D.   On: 03/21/2020 12:53     Subjective: In good spirits. No complaints  Discharge Exam: Vitals:   03/24/20 0425 03/24/20 0753  BP: (!) 127/59 (!) 101/52  Pulse: 64 69  Resp: 18 15  Temp: 98.2 F (36.8 C) 98.8 F (37.1 C)  SpO2: 97% 95%   Vitals:   03/23/20 1300 03/23/20 2128 03/24/20 0425 03/24/20 0753  BP: 105/62 (!) 101/52 (!) 127/59 (!) 101/52  Pulse: 62 65 64 69  Resp: 16 18 18 15   Temp: 98.2 F (36.8 C) 98 F (36.7 C) 98.2 F (36.8 C) 98.8 F (37.1 C)  TempSrc: Oral Oral Oral Oral  SpO2: 94% 94% 97% 95%  Weight:      Height:        General: Pt is alert, awake, not in acute  distress Cardiovascular: RRR, S1/S2 +, no rubs, no gallops Respiratory: CTA bilaterally, no wheezing, no rhonchi Abdominal: Soft, NT, ND, bowel sounds + Extremities: no edema, no cyanosis   The results of significant diagnostics from this hospitalization (including imaging, microbiology, ancillary and laboratory) are listed below for reference.     Microbiology: Recent Results (from the past 240 hour(s))  SARS Coronavirus 2 by RT PCR (hospital order, performed in [facility] hospital lab) Nasopharyngeal Nasopharyngeal Swab     Status: None   Collection Time: 03/21/20  2:02 PM   Specimen: Nasopharyngeal Swab  Result Value Ref Range Status   SARS Coronavirus 2 NEGATIVE NEGATIVE Final    Comment: (NOTE) SARS-CoV-2 target nucleic acids are NOT DETECTED.  The SARS-CoV-2 RNA is generally detectable in upper and lower respiratory specimens during the acute phase of infection. The lowest concentration of SARS-CoV-2 viral copies this assay can detect is 250 copies / mL. A negative result does not preclude SARS-CoV-2 infection and should not be used as the sole  basis for treatment or other patient management decisions.  A negative result may occur with improper specimen collection / handling, submission of specimen other than nasopharyngeal swab, presence of viral mutation(s) within the areas targeted by this assay, and inadequate number of viral copies (<250 copies / mL). A negative result must be combined with clinical observations, patient history, and epidemiological information.  Fact Sheet for Patients:   StrictlyIdeas.no  Fact Sheet for Healthcare Providers: BankingDealers.co.za  This test is not yet approved or  cleared by the [place] FDA and has been authorized for detection and/or diagnosis of SARS-CoV-2 by FDA under an Emergency Use Authorization (EUA).  This EUA will remain in effect (meaning this test can be used) for the  duration of the COVID-19 declaration under Section 564(b)(1) of the Act, 21 U.S.C. section 360bbb-3(b)(1), unless the authorization is terminated or revoked sooner.  Performed at [facility], [place] [address], [place], [place] [address]   Surgical pcr screen     Status: None   Collection Time: 03/22/20 12:52 AM   Specimen: Nasal Mucosa; Nasal Swab  Result Value Ref Range Status   MRSA, PCR NEGATIVE NEGATIVE Final   Staphylococcus aureus NEGATIVE NEGATIVE Final    Comment: (NOTE) The Xpert SA Assay (FDA approved for NASAL specimens in patients 24 years of age and older), is one component of a comprehensive surveillance program. It is not intended to diagnose infection nor to guide or monitor treatment. Performed at [facility], [place] [address] [place], [place] [address]      Labs: BNP (last 3 results) No results for input(s): BNP in the last 8760 hours. Basic Metabolic Panel: Recent Labs  Lab 03/21/20 1202 03/22/20 0820 03/23/20 0958 03/24/20 0456  NA 139 139 136 135  K 4.5 4.8 3.9 4.2  CL 105 106 103 105  CO2 23 23 25 23   GLUCOSE 88 110* 137* 93  BUN 28* 30* 32* 44*  CREATININE 1.56* 1.56* 1.72* 1.71*  CALCIUM 9.2 9.1 8.6* 8.6*   Liver Function Tests: No results for input(s): AST, ALT, ALKPHOS, BILITOT, PROT, ALBUMIN in the last 168 hours. No results for input(s): LIPASE, AMYLASE in the last 168 hours. No results for input(s): AMMONIA in the last 168 hours. CBC: Recent Labs  Lab 03/21/20 1202 03/22/20 0820 03/23/20 0958 03/24/20 0456  WBC 6.3 9.8 11.1* 10.1  NEUTROABS 3.8  --   --   --   HGB 11.5* 9.9* 8.7* 7.9*  HCT 37.4* 32.3* 28.4* 25.4*  MCV 101.9* 99.1 100.7* 99.2  PLT 209 195 159 151   Cardiac Enzymes: No results for input(s): CKTOTAL, CKMB, CKMBINDEX, TROPONINI in the last 168 hours. BNP: Invalid input(s): POCBNP CBG: Recent Labs  Lab 03/22/20 1153 03/22/20 2107 03/23/20 0615 03/23/20 1229 03/23/20 1614  GLUCAP 93 186* 139*  324* 131*   D-Dimer No results for input(s): DDIMER in the last 72 hours. Hgb A1c No results for input(s): HGBA1C in the last 72 hours. Lipid Profile No results for input(s): CHOL, HDL, LDLCALC, TRIG, CHOLHDL, LDLDIRECT in the last 72 hours. Thyroid function studies No results for input(s): TSH, T4TOTAL, T3FREE, THYROIDAB in the last 72 hours.  Invalid input(s): FREET3 Anemia work up No results for input(s): VITAMINB12, FOLATE, FERRITIN, TIBC, IRON, RETICCTPCT in the last 72 hours. Urinalysis    Component Value Date/Time   COLORURINE YELLOW 03/21/2020 1455   APPEARANCEUR CLOUDY (A) 03/21/2020 1455   LABSPEC 1.010 03/21/2020 1455   PHURINE 6.0 03/21/2020 1455  GLUCOSEU NEGATIVE 03/21/2020 1455   HGBUR SMALL (A) 03/21/2020 1455   BILIRUBINUR NEGATIVE 03/21/2020 1455   KETONESUR NEGATIVE 03/21/2020 1455   PROTEINUR 30 (A) 03/21/2020 1455   UROBILINOGEN 0.2 11/21/2009 1617   NITRITE NEGATIVE 03/21/2020 1455   LEUKOCYTESUR LARGE (A) 03/21/2020 1455   Sepsis Labs Invalid input(s): PROCALCITONIN,  WBC,  LACTICIDVEN Microbiology Recent Results (from the past 240 hour(s))  SARS Coronavirus 2 by RT PCR (hospital order, performed in [place] [place] hospital lab) Nasopharyngeal Nasopharyngeal Swab     Status: None   Collection Time: 03/21/20  2:02 PM   Specimen: Nasopharyngeal Swab  Result Value Ref Range Status   SARS Coronavirus 2 NEGATIVE NEGATIVE Final    Comment: (NOTE) SARS-CoV-2 target nucleic acids are NOT DETECTED.  The SARS-CoV-2 RNA is generally detectable in upper and lower respiratory specimens during the acute phase of infection. The lowest concentration of SARS-CoV-2 viral copies this assay can detect is 250 copies / mL. A negative result does not preclude SARS-CoV-2 infection and should not be used as the sole basis for treatment or other patient management decisions.  A negative result may occur with improper specimen collection / handling, submission of specimen  other than nasopharyngeal swab, presence of viral mutation(s) within the areas targeted by this assay, and inadequate number of viral copies (<250 copies / mL). A negative result must be combined with clinical observations, patient history, and epidemiological information.  Fact Sheet for Patients:   StrictlyIdeas.no  Fact Sheet for Healthcare Providers: BankingDealers.co.za  This test is not yet approved or  cleared by the [place] FDA and has been authorized for detection and/or diagnosis of SARS-CoV-2 by FDA under an Emergency Use Authorization (EUA).  This EUA will remain in effect (meaning this test can be used) for the duration of the COVID-19 declaration under Section 564(b)(1) of the Act, 21 U.S.C. section 360bbb-3(b)(1), unless the authorization is terminated or revoked sooner.  Performed at [facility], [place] [address], [place], [place] [address]   Surgical pcr screen     Status: None   Collection Time: 03/22/20 12:52 AM   Specimen: Nasal Mucosa; Nasal Swab  Result Value Ref Range Status   MRSA, PCR NEGATIVE NEGATIVE Final   Staphylococcus aureus NEGATIVE NEGATIVE Final    Comment: (NOTE) The Xpert SA Assay (FDA approved for NASAL specimens in patients 28 years of age and older), is one component of a comprehensive surveillance program. It is not intended to diagnose infection nor to guide or monitor treatment. Performed at [facility], [place] [address] [place], [place] [address]    Time spent: 30 min  SIGNED:   Marylu Lund, MD  [facility] 03/24/2020, 10:17 AM  If 7PM-7AM, please contact night-coverage

## 2020-03-24 NOTE — NC FL2 (Addendum)
[place] LEVEL OF CARE SCREENING TOOL     IDENTIFICATION  Patient Name: [name] [name] Birthdate: 12-06-1925 Sex: male Admission Date (Current Location): 03/21/2020  [facility] [place] and [place] Number:  Herbalist and Address:  [facility], [place] [address] [place], [place] [address]      Provider Number: [id number]  Attending Physician Name and Address:  Donne Hazel, MD  Relative Name and Phone Number:       Current Level of Care: Hospital Recommended Level of Care: [place] Prior Approval Number:    Date Approved/Denied:   PASRR Number: [id number]  Discharge Plan: SNF    Current Diagnoses: Patient Active Problem List   Diagnosis Date Noted  . Acquired thrombophilia ([place]) 03/23/2020  . Hip fracture ([place]) 03/21/2020  . DNR (do not resuscitate) 03/21/2020  . Urinary urgency 01/01/2019  . Candidiasis 01/01/2019  . Closed displaced intertrochanteric fracture of left femur ([place]) 12/25/2018  . Closed intertrochanteric fracture of hip, left, initial encounter ([place]) 12/25/2018  . Pain due to onychomycosis of toenails of both feet 12/12/2018  . Acute blood loss anemia 06/08/2018  . GI bleed 06/05/2018  . HTN (hypertension)   . High cholesterol   . Diverticulosis   . Lower GI bleed   . AAA (abdominal aortic aneurysm) without rupture ([place]) 01/09/2018  . Ascending aortic aneurysm ([place]) 01/09/2018  . Heart block AV second degree 07/15/2017  . Status post three vessel coronary artery bypass 07/15/2017  . Left ventricular systolic dysfunction without heart failure 05/11/2017  . Hypercholesterolemia 05/11/2017  . CHB (complete heart block) ([place]) 06/07/2015  . Alzheimer's disease ([place]) 01/26/2015  . Pacemaker 01/26/2013  . Permanent atrial fibrillation ([place]) 01/26/2013  . Second degree AV block, Mobitz type II 01/26/2013  . Coronary artery disease involving native coronary artery of native heart without angina  pectoris 01/26/2013  . Dyspnea 01/26/2013    Orientation RESPIRATION BLADDER Height & Weight     Self  Normal External catheter Weight: 70 kg Height:  5\' 10"  (177.8 cm)  BEHAVIORAL SYMPTOMS/MOOD NEUROLOGICAL BOWEL NUTRITION STATUS      Continent Diet (refer to d/c summary)  AMBULATORY STATUS COMMUNICATION OF NEEDS Skin   Extensive Assist Verbally  (IM nailing R femur fx, 9/21)                       Personal Care Assistance Level of Assistance  Bathing, Feeding, Dressing Bathing Assistance: Maximum assistance Feeding assistance: Limited assistance Dressing Assistance: Maximum assistance     Functional Limitations Info  Sight, Hearing, Speech Sight Info: Adequate Hearing Info: Adequate Speech Info: Adequate    SPECIAL CARE FACTORS FREQUENCY    PT 5 x/ week, evaluate and treat, OT 5 x/ week, evaluate and treat                    Contractures      Additional Factors Info  Code Status, Allergies Code Status Info: DNR Allergies Info: Amiodarone, Antihistamines, Diphenhydramine-type           Current Medications (03/24/2020):  This is the current hospital active medication list Current Facility-Administered Medications  Medication Dose Route Frequency Provider Last Rate Last Admin  . acetaminophen (TYLENOL) tablet 325-650 mg  325-650 mg Oral Q6H PRN Newt Minion, MD      . aspirin EC tablet 325 mg  325 mg Oral Q breakfast Newt Minion, MD   325 mg at  03/23/20 0943  . atorvastatin (LIPITOR) tablet 40 mg  40 mg Oral QPM Newt Minion, MD   40 mg at 03/23/20 1703  . bisacodyl (DULCOLAX) EC tablet 5 mg  5 mg Oral Daily PRN Newt Minion, MD      . docusate sodium (COLACE) capsule 100 mg  100 mg Oral BID Newt Minion, MD   100 mg at 03/23/20 2115  . donepezil (ARICEPT) tablet 5 mg  5 mg Oral BID Newt Minion, MD   5 mg at 03/23/20 2115  . feeding supplement (ENSURE ENLIVE) (ENSURE ENLIVE) liquid 237 mL  237 mL Oral BID BM Newt Minion, MD   237 mL at  03/23/20 1322  . finasteride (PROSCAR) tablet 5 mg  5 mg Oral QPM Newt Minion, MD   5 mg at 03/23/20 1702  . HYDROcodone-acetaminophen (NORCO) 7.5-325 MG per tablet 1-2 tablet  1-2 tablet Oral Q4H PRN Newt Minion, MD      . HYDROcodone-acetaminophen (NORCO/VICODIN) 5-325 MG per tablet 1-2 tablet  1-2 tablet Oral Q4H PRN Newt Minion, MD      . menthol-cetylpyridinium (CEPACOL) lozenge 3 mg  1 lozenge Oral PRN Newt Minion, MD       Or  . phenol (CHLORASEPTIC) mouth spray 1 spray  1 spray Mouth/Throat PRN Newt Minion, MD      . methocarbamol (ROBAXIN) tablet 500 mg  500 mg Oral Q6H PRN Newt Minion, MD   500 mg at 03/21/20 2111   Or  . methocarbamol (ROBAXIN) 500 mg in dextrose 5 % 50 mL IVPB  500 mg Intravenous Q6H PRN Newt Minion, MD      . metoCLOPramide (REGLAN) tablet 5-10 mg  5-10 mg Oral Q8H PRN Newt Minion, MD       Or  . metoCLOPramide (REGLAN) injection 5-10 mg  5-10 mg Intravenous Q8H PRN Newt Minion, MD      . morphine 2 MG/ML injection 0.5-1 mg  0.5-1 mg Intravenous Q2H PRN Newt Minion, MD      . multivitamin with minerals tablet 1 tablet  1 tablet Oral Daily Newt Minion, MD   1 tablet at 03/23/20 [phone]  . ondansetron (ZOFRAN) tablet 4 mg  4 mg Oral Q6H PRN Newt Minion, MD       Or  . ondansetron [facility]) injection 4 mg  4 mg Intravenous Q6H PRN Newt Minion, MD      . polyethylene glycol (MIRALAX / GLYCOLAX) packet 17 g  17 g Oral Daily PRN Newt Minion, MD      . sertraline (ZOLOFT) tablet 25 mg  25 mg Oral Daily Newt Minion, MD   25 mg at 03/23/20 2841     Discharge Medications: Please see discharge summary for a list of discharge medications.  Relevant Imaging Results:  Relevant Lab Results:   Additional Information    Sharin Mons, RN

## 2020-03-24 NOTE — Care Management Important Message (Signed)
Important Message  Patient Details  Name: [name] [name] MRN: [id number] Date of Birth: [birth year]   Medicare Important Message Given:  Yes - Important Message mailed due to current National Emergency  Verbal consent obtained due to current National Emergency  Relationship to patient: Child Contact Name: Juergen Hardenbrook Call Date: 03/24/20  Time: 1126 Phone: [phone] Outcome: No Answer/Busy Important Message mailed to: Patient address on file    Delorse Lek 03/24/2020, 11:26 AM

## 2020-03-24 NOTE — Plan of Care (Signed)

## 2020-03-24 NOTE — Progress Notes (Signed)
  Report given to Tameka,LPN at [facility]. All questions and conceerns were fully answered. Discharge summary packet/pertinent documents given to [facility] staff. Pt alert in no apparent distress, Spouse at bedisde. D/C to [facility] as ordered.

## 2020-03-25 ENCOUNTER — Non-Acute Institutional Stay (SKILLED_NURSING_FACILITY): Payer: Medicare Other | Admitting: Internal Medicine

## 2020-03-25 ENCOUNTER — Encounter: Payer: Self-pay | Admitting: Internal Medicine

## 2020-03-25 DIAGNOSIS — G301 Alzheimer's disease with late onset: Secondary | ICD-10-CM | POA: Diagnosis not present

## 2020-03-25 DIAGNOSIS — D508 Other iron deficiency anemias: Secondary | ICD-10-CM | POA: Diagnosis not present

## 2020-03-25 DIAGNOSIS — I10 Essential (primary) hypertension: Secondary | ICD-10-CM | POA: Diagnosis not present

## 2020-03-25 DIAGNOSIS — F0281 Dementia in other diseases classified elsewhere with behavioral disturbance: Secondary | ICD-10-CM | POA: Diagnosis not present

## 2020-03-25 DIAGNOSIS — F02818 Dementia in other diseases classified elsewhere, unspecified severity, with other behavioral disturbance: Secondary | ICD-10-CM

## 2020-03-25 DIAGNOSIS — Z8781 Personal history of (healed) traumatic fracture: Secondary | ICD-10-CM

## 2020-03-25 DIAGNOSIS — N1832 Chronic kidney disease, stage 3b: Secondary | ICD-10-CM

## 2020-03-25 DIAGNOSIS — I4821 Permanent atrial fibrillation: Secondary | ICD-10-CM

## 2020-03-25 NOTE — Progress Notes (Signed)
Provider:  Veleta Miners, MD Location:   [facility] Room Number: 17-A Place of Service:  SNF (31)  PCP: [name], MD Patient Care Team: [name], MD as PCP - General (Internal Medicine) [name], MD as PCP - Cardiology (Cardiology) Virgie Dad, MD as Consulting Physician (Internal Medicine)  Extended Emergency Contact Information Primary Emergency Contact: Marnette Burgess Address: [address]          [place] [address] Johnnette [place]itter of [place] Phone: [phone] Mobile Phone: [phone] Relation: Spouse Secondary Emergency Contact: Furious, Chiarelli Mobile Phone: [phone] Relation: Son  Code Status: DNR Goals of Care: Advanced Directive information Advanced Directives 03/25/2020  Does Patient Have a Medical Advance Directive? Yes  Type of Advance Directive Out of facility DNR (pink MOST or yellow form)  Does patient want to make changes to medical advance directive? No - Patient declined  Copy of [place] in Chart? -  Would patient like information on creating a medical advance directive? -  Pre-existing out of facility DNR order (yellow form or pink MOST form) -      Chief Complaint  Patient presents with  . New Admit To SNF    New Admit    HPI: Patient is a 86 y.o. male seen today for admission to SNF for therapy  Admitted in the hospital from 9/20-9/23 for Right Hip Fracture  Patient has a history of CAD s/p CABG, CHB s/p PPM, PAF not on any coagulation due to history of GI bleeds and fall, hypertension, hyperlipidemia, history of abdominal aortic aneurysm 4 cm cognitive impairment S/P Left Hip Fracture in 6/21  He had Mechanical fall in his Home where he lives with his Wife Was found to have right hip Fracture Underwent I/M Nailing  on 9/21 Now in SNF for therapy Cannot tell me the history due to his Dementia  His Only complain was the pain in his Right leg Did not know why  he is having pain  Denied everything else       Past Medical History:  Diagnosis Date  . Atrial fibrillation, permanent ([place])    no longer on AC due to GI bleed  . CAD (coronary artery disease)   . Dementia ([place])   . Diverticulosis    diverticular bleed 2011  . High cholesterol   . HTN (hypertension)    has improved over time, no rx as of 2019  . Lower GI bleed    Past Surgical History:  Procedure Laterality Date  . angiolplasty    . APPENDECTOMY    . INSERT / REPLACE / REMOVE PACEMAKER    . INTRAMEDULLARY (IM) NAIL INTERTROCHANTERIC Left 12/26/2018   Procedure: INTRAMEDULLARY (IM) NAIL INTERTROCHANTRIC;  Surgeon: Leandrew Koyanagi, MD;  Location: [facility];  Service: Orthopedics;  Laterality: Left;  . INTRAMEDULLARY (IM) NAIL INTERTROCHANTERIC Right 03/22/2020   Procedure: INTRAMEDULLARY (IM) NAIL INTERTROCHANTRIC;  Surgeon: Newt Minion, MD;  Location: [facility];  Service: Orthopedics;  Laterality: Right;  . KNEE SURGERY    . PILONIDAL CYST EXCISION    . rotator cuff surgery  1985  . TONSILLECTOMY    . triple heart bypass      reports that he quit smoking about 45 years ago. He has never used smokeless tobacco. He reports previous alcohol use. He reports that he does not use drugs. Social History   Socioeconomic History  . Marital status: Married    Spouse name: [name]  . Number of  children: 6  . Years of education: college  . Highest education level: Not on file  Occupational History  . Occupation: retired  Tobacco Use  . Smoking status: Former Smoker    Quit date: 07/02/1974    Years since quitting: 45.7  . Smokeless tobacco: Never Used  Vaping Use  . Vaping Use: Never used  Substance and Sexual Activity  . Alcohol use: Not Currently    Comment: last used in dec 2019  . Drug use: No  . Sexual activity: Not on file  Other Topics Concern  . Not on file  Social History Narrative   Patient is married [name]) and lives at home with his wife.   Patient has a  college education.   Patient is a retired Materials engineer.   Patient does not drink any caffeine.   Patient has six children.   Social Determinants of Health   Financial Resource Strain:   . Difficulty of Paying Living Expenses: Not on file  Food Insecurity:   . Worried About Charity fundraiser in the Last Year: Not on file  . Ran Out of Food in the Last Year: Not on file  Transportation Needs:   . Lack of Transportation (Medical): Not on file  . Lack of Transportation (Non-Medical): Not on file  Physical Activity:   . Days of Exercise per Week: Not on file  . Minutes of Exercise per Session: Not on file  Stress:   . Feeling of Stress : Not on file  Social Connections:   . Frequency of Communication with Friends and Family: Not on file  . Frequency of Social Gatherings with Friends and Family: Not on file  . Attends Religious Services: Not on file  . Active Member of Clubs or Organizations: Not on file  . Attends Archivist Meetings: Not on file  . Marital Status: Not on file  Intimate Partner Violence:   . Fear of Current or Ex-Partner: Not on file  . Emotionally Abused: Not on file  . Physically Abused: Not on file  . Sexually Abused: Not on file    Functional Status Survey:    Family History  Problem Relation Age of Onset  . Stroke Father   . Cervical cancer Other        siblings  . COPD Sister   . Dementia Brother     Health Maintenance  Topic Date Due  . INFLUENZA VACCINE  01/31/2020  . TETANUS/TDAP  10/28/2026  . COVID-19 Vaccine  Completed  . PNA vac Low Risk Adult  Completed    Allergies  Allergen Reactions  . Amiodarone Other (See Comments)    Had a severe lung infection in 2003  . Antihistamines, Diphenhydramine-Type Other (See Comments)    Jittery    Allergies as of 03/25/2020      Reactions   Amiodarone Other (See Comments)   Had a severe lung infection in 2003   Antihistamines, Diphenhydramine-type Other (See Comments)    Jittery      Medication List       Accurate as of March 25, 2020 11:24 AM. If you have any questions, ask your nurse or doctor.        acetaminophen 500 MG tablet Commonly known as: TYLENOL Take 1 tablet (500 mg total) by mouth every 6 (six) hours as needed for mild pain.   aspirin 325 MG EC tablet Take 1 tablet (325 mg total) by mouth daily with breakfast for 21 days.  atorvastatin 40 MG tablet Commonly known as: LIPITOR Take 40 mg by mouth every evening.   bisacodyl 5 MG EC tablet Commonly known as: DULCOLAX Take 1 tablet (5 mg total) by mouth daily as needed for moderate constipation.   docusate sodium 100 MG capsule Commonly known as: COLACE Take 1 capsule (100 mg total) by mouth 2 (two) times daily.   donepezil 5 MG tablet Commonly known as: ARICEPT Take 1 tablet (5 mg total) by mouth 2 (two) times daily.   ferrous sulfate 325 (65 FE) MG EC tablet Take 325 mg by mouth 3 (three) times a week. Monday, Wednesday and Friday morning.   finasteride 5 MG tablet Commonly known as: PROSCAR Take 5 mg by mouth every evening.   HYDROcodone-acetaminophen 5-325 MG tablet Commonly known as: NORCO/VICODIN Take 1-2 tablets by mouth every 4 (four) hours as needed for moderate pain (pain score 4-6).   MILK OF MAGNESIA PO Take 30 mLs by mouth.   multivitamin with minerals Tabs tablet Take 1 tablet by mouth daily.   polyethylene glycol 17 g packet Commonly known as: MIRALAX / GLYCOLAX Take 17 g by mouth daily as needed for mild constipation.   sertraline 25 MG tablet Commonly known as: ZOLOFT Take 25 mg by mouth daily.   Vitamin D 50 MCG (2000 UT) Caps Take 2,000 Units by mouth at bedtime.       Review of Systems  Constitutional: Positive for activity change.  HENT: Negative.   Respiratory: Negative.   Cardiovascular: Negative.   Gastrointestinal: Negative.   Genitourinary: Negative.   Musculoskeletal: Positive for arthralgias, gait problem and myalgias.   Skin: Negative.   Neurological: Positive for weakness.  Psychiatric/Behavioral: Positive for confusion.    Vitals:   03/25/20 1101  BP: 120/60  Pulse: 66  Resp: 20  Temp: (!) 97 F (36.1 C)  SpO2: 96%  Weight: 154 lb 4.8 oz (70 kg)  Height: 5\' 10"  (1.778 m)   Body mass index is 22.14 kg/m. Physical Exam Vitals reviewed.  Constitutional:      Appearance: Normal appearance.  HENT:     Head: Normocephalic.     Nose: Nose normal.     Mouth/Throat:     Mouth: Mucous membranes are moist.     Pharynx: Oropharynx is clear.  Eyes:     Pupils: Pupils are equal, round, and reactive to light.  Cardiovascular:     Rate and Rhythm: Normal rate and regular rhythm.     Pulses: Normal pulses.  Pulmonary:     Effort: Pulmonary effort is normal.     Breath sounds: Normal breath sounds.  Abdominal:     General: Abdomen is flat. Bowel sounds are normal.     Palpations: Abdomen is soft.  Musculoskeletal:        General: No swelling.     Cervical back: Neck supple.  Skin:    General: Skin is warm and dry.  Neurological:     General: No focal deficit present.     Mental Status: He is alert.     Comments: Alert But confused and in Pain  Psychiatric:        Mood and Affect: Mood normal.        Thought Content: Thought content normal.     Labs reviewed: Basic Metabolic Panel: Recent Labs    03/22/20 0820 03/23/20 0958 03/24/20 0456  NA 139 136 135  K 4.8 3.9 4.2  CL 106 103 105  CO2 23 25 23  GLUCOSE 110* 137* 93  BUN 30* 32* 44*  CREATININE 1.56* 1.72* 1.71*  CALCIUM 9.1 8.6* 8.6*   Liver Function Tests: No results for input(s): AST, ALT, ALKPHOS, BILITOT, PROT, ALBUMIN in the last 8760 hours. No results for input(s): LIPASE, AMYLASE in the last 8760 hours. No results for input(s): AMMONIA in the last 8760 hours. CBC: Recent Labs    03/21/20 1202 03/21/20 1202 03/22/20 0820 03/23/20 0958 03/24/20 0456  WBC 6.3   < > 9.8 11.1* 10.1  NEUTROABS 3.8  --   --    --   --   HGB 11.5*   < > 9.9* 8.7* 7.9*  HCT 37.4*   < > 32.3* 28.4* 25.4*  MCV 101.9*   < > 99.1 100.7* 99.2  PLT 209   < > 195 159 151   < > = values in this interval not displayed.   Cardiac Enzymes: No results for input(s): CKTOTAL, CKMB, CKMBINDEX, TROPONINI in the last 8760 hours. BNP: Invalid input(s): POCBNP Lab Results  Component Value Date   HGBA1C  12/13/2008    5.5 (NOTE) The ADA recommends the following therapeutic goal for glycemic control related to Hgb A1c measurement: Goal of therapy: <6.5 Hgb A1c  Reference: American Diabetes Association: Clinical Practice Recommendations 2010, Diabetes Care, 2010, 33: (Suppl  1).   No results found for: TSH No results found for: VITAMINB12 No results found for: FOLATE No results found for: IRON, TIBC, FERRITIN  Imaging and Procedures obtained prior to SNF admission: DG Chest 1 View  Result Date: 03/21/2020 CLINICAL DATA:  [place] and fractured right hip. EXAM: CHEST  1 VIEW COMPARISON:  Chest x-ray 12/25/2018 FINDINGS: The right atrial and right ventricular pacer wires are in good position, unchanged. The heart is normal in size. Stable tortuosity and calcification of the thoracic aorta. Stable surgical changes from bypass surgery. Chronic bronchitic type interstitial lung changes but no acute pulmonary findings. No pleural effusion or pneumothorax. The bony thorax is grossly intact. IMPRESSION: Chronic lung changes but no acute pulmonary findings. Electronically Signed   By: Marijo Sanes M.D.   On: 03/21/2020 12:54   CT HEAD WO CONTRAST  Result Date: 03/21/2020 CLINICAL DATA:  Pain following fall EXAM: CT HEAD WITHOUT CONTRAST CT CERVICAL SPINE WITHOUT CONTRAST TECHNIQUE: Multidetector CT imaging of the head and cervical spine was performed following the standard protocol without intravenous contrast. Multiplanar CT image reconstructions of the cervical spine were also generated. COMPARISON:  CT head; CT cervical spine June 05, 2018 FINDINGS: CT HEAD FINDINGS Brain: Moderate diffuse atrophy is stable. There is no intracranial mass, hemorrhage, extra-axial fluid collection, or midline shift. There is patchy small vessel disease in the centra semiovale bilaterally. No acute infarct is appreciable. Vascular: No hyperdense vessel. There is calcification in each carotid siphon region. Skull: Bony calvarium appears intact. Sinuses/Orbits: There is mucosal thickening in several ethmoid air cells. Other paranasal sinuses are clear. Orbits appear symmetric bilaterally. Other: Mastoid air cells are clear. CT CERVICAL SPINE FINDINGS Alignment: There is 3 mm of anterolisthesis of C7 on T1, stable. There is no new spondylolisthesis. Skull base and vertebrae: There is pannus posterior to the odontoid without impression on the craniocervical junction. There is extensive erosion in the odontoid, stable from prior study. Bones are osteoporotic. No acute fracture is appreciable. No blastic or lytic bone lesions are evident. Soft tissues and spinal canal: Prevertebral soft tissues and predental space regions are normal. There is no appreciable cord or canal hematoma. No  paraspinous lesions. Disc levels: There is severe disc space narrowing at C4-5, C5-6, and C7-T1. There is ankylosis at C6-7 and T1-2, stable. There is facet osteoarthritic change to varying degrees at all levels. Exit foraminal narrowing is noted at C3-4 bilaterally CT and at C5-6 bilaterally with impression on exiting nerve roots. A lesser degree of arthropathy is noted at other levels. No disc extrusion or high-grade stenosis. Note that there is moderate broad-based disc bulging at C5-6 and C6-7. Upper chest: There is emphysematous change in the upper lobes. No edema or consolidation noted in the upper lobe regions. Other: There are foci of subclavian and carotid artery calcification bilaterally. IMPRESSION: CT head: Stable atrophy with periventricular small vessel disease. No mass or  hemorrhage. No extra-axial fluid collection. No acute infarct. There are foci of arterial vascular calcification. There is mucosal thickening in several ethmoid air cells. CT cervical spine: Diffuse osteoporosis. No fracture. Stable 3 mm of anterolisthesis of C7 on T1. No new spondylolisthesis. Multilevel arthropathy, essentially stable. Impression on exiting nerve roots, most severe at C3-4 and C5-6 bilaterally. No frank disc extrusion or high-grade stenosis. Underlying emphysematous change. Foci of subclavian and carotid artery calcification noted bilaterally. Electronically Signed   By: Lowella Grip III M.D.   On: 03/21/2020 13:29   CT CERVICAL SPINE WO CONTRAST  Result Date: 03/21/2020 CLINICAL DATA:  Pain following fall EXAM: CT HEAD WITHOUT CONTRAST CT CERVICAL SPINE WITHOUT CONTRAST TECHNIQUE: Multidetector CT imaging of the head and cervical spine was performed following the standard protocol without intravenous contrast. Multiplanar CT image reconstructions of the cervical spine were also generated. COMPARISON:  CT head; CT cervical spine June 05, 2018 FINDINGS: CT HEAD FINDINGS Brain: Moderate diffuse atrophy is stable. There is no intracranial mass, hemorrhage, extra-axial fluid collection, or midline shift. There is patchy small vessel disease in the centra semiovale bilaterally. No acute infarct is appreciable. Vascular: No hyperdense vessel. There is calcification in each carotid siphon region. Skull: Bony calvarium appears intact. Sinuses/Orbits: There is mucosal thickening in several ethmoid air cells. Other paranasal sinuses are clear. Orbits appear symmetric bilaterally. Other: Mastoid air cells are clear. CT CERVICAL SPINE FINDINGS Alignment: There is 3 mm of anterolisthesis of C7 on T1, stable. There is no new spondylolisthesis. Skull base and vertebrae: There is pannus posterior to the odontoid without impression on the craniocervical junction. There is extensive erosion in the  odontoid, stable from prior study. Bones are osteoporotic. No acute fracture is appreciable. No blastic or lytic bone lesions are evident. Soft tissues and spinal canal: Prevertebral soft tissues and predental space regions are normal. There is no appreciable cord or canal hematoma. No paraspinous lesions. Disc levels: There is severe disc space narrowing at C4-5, C5-6, and C7-T1. There is ankylosis at C6-7 and T1-2, stable. There is facet osteoarthritic change to varying degrees at all levels. Exit foraminal narrowing is noted at C3-4 bilaterally CT and at C5-6 bilaterally with impression on exiting nerve roots. A lesser degree of arthropathy is noted at other levels. No disc extrusion or high-grade stenosis. Note that there is moderate broad-based disc bulging at C5-6 and C6-7. Upper chest: There is emphysematous change in the upper lobes. No edema or consolidation noted in the upper lobe regions. Other: There are foci of subclavian and carotid artery calcification bilaterally. IMPRESSION: CT head: Stable atrophy with periventricular small vessel disease. No mass or hemorrhage. No extra-axial fluid collection. No acute infarct. There are foci of arterial vascular calcification. There is mucosal thickening in  several ethmoid air cells. CT cervical spine: Diffuse osteoporosis. No fracture. Stable 3 mm of anterolisthesis of C7 on T1. No new spondylolisthesis. Multilevel arthropathy, essentially stable. Impression on exiting nerve roots, most severe at C3-4 and C5-6 bilaterally. No frank disc extrusion or high-grade stenosis. Underlying emphysematous change. Foci of subclavian and carotid artery calcification noted bilaterally. Electronically Signed   By: Lowella Grip III M.D.   On: 03/21/2020 13:29   DG C-Arm 1-60 Min-No Report  Result Date: 03/22/2020 Fluoroscopy was utilized by the requesting physician.  No radiographic interpretation.   DG Hip Unilat With Pelvis 2-3 Views Right  Result Date:  03/21/2020 CLINICAL DATA:  Fall, right hip pain EXAM: DG HIP (WITH OR WITHOUT PELVIS) 2-3V RIGHT COMPARISON:  12/25/2018 FINDINGS: Right femoral intertrochanteric fracture noted. Minimal displacement. No significant angulation. No no subluxation or dislocation. Hardware seen within the left proximal femur. IMPRESSION: Right femoral intertrochanteric fracture. Electronically Signed   By: Rolm Baptise M.D.   On: 03/21/2020 12:53    Assessment/Plan S/P right hip fracture I/M NAiling Pain control with Hydrocodone Also on tylenol Aspirin for 21 days WBAT Follow up with Ortho  Permanent atrial fibrillation (HCC) Not anticoagulated due to GI bleed and Falls  Late onset Alzheimer's disease with behavioral disturbance (HCC) On Aricept Continue Supportive care  iron deficiency anemia and CKD Increased Iron to QD Repeat CBC Stage 3b chronic kidney disease Repeat BMP Hyperlipidemia On Lipitor Depression On Zoloft Family/ staff Communication:   Labs/tests ordered: CBC, CMP in 1 week

## 2020-03-29 ENCOUNTER — Ambulatory Visit (INDEPENDENT_AMBULATORY_CARE_PROVIDER_SITE_OTHER): Payer: Medicare Other | Admitting: Physician Assistant

## 2020-03-29 ENCOUNTER — Encounter: Payer: Self-pay | Admitting: Family

## 2020-03-29 VITALS — Ht 70.0 in | Wt 154.0 lb

## 2020-03-29 DIAGNOSIS — S72142A Displaced intertrochanteric fracture of left femur, initial encounter for closed fracture: Secondary | ICD-10-CM

## 2020-03-29 NOTE — Progress Notes (Signed)
Office Visit Note   Patient: [name] [name]           Date of Birth: 08-14-1925           MRN: [id number] Visit Date: 03/29/2020              Requested by: [name], MD [address] [place],  [place] [address] PCP: [name], MD  Chief Complaint  Patient presents with  . Right Hip - Routine Post Op    Right hip intertrochanteric fracture IM nailing 03/22/2020      HPI: Patient is 1 week status post ORIF right IM nail inner troches fracture.  He is at friend's home and is accompanied today by his wife.  He has begun placing a little weight on his right leg.  He is also complaining of a small painful sore on his back  Assessment & Plan: Visit Diagnoses: No diagnosis found.  Plan: Recommended foam dressing and close diligence to prevent further sores.  Continue to work on mobilization.  Per wife prior to this fracture he was ambulatory with a cane.  Follow-up in 1 week at which time x-rays could be taken and hopefully staples could be removed while the patient is in x-ray  Follow-Up Instructions: No follow-ups on file.   Ortho Exam  Patient is alert, oriented, no adenopathy, well-dressed, normal affect, normal respiratory effort. Dressing in place.  No drainage no erythema surgical wound is well approximated no evidence of infection  Imaging: No results found. No images are attached to the encounter.  Labs: Lab Results  Component Value Date   HGBA1C  12/13/2008    5.5 (NOTE) The ADA recommends the following therapeutic goal for glycemic control related to Hgb A1c measurement: Goal of therapy: <6.5 Hgb A1c  Reference: American Diabetes Association: Clinical Practice Recommendations 2010, Diabetes Care, 2010, 33: (Suppl  1).   REPTSTATUS 06/10/2018 FINAL 06/05/2018   CULT  06/05/2018    NO GROWTH 5 DAYS Performed at [facility], [place] [address], [place], [place] [address]      Lab Results  Component Value Date   ALBUMIN 3.1 (L)  12/25/2018   ALBUMIN 2.3 (L) 06/08/2018   ALBUMIN 2.6 (L) 06/05/2018    Lab Results  Component Value Date   MG 2.2 12/16/2008   MG 2.5 12/15/2008   No results found for: VD25OH  No results found for: PREALBUMIN CBC EXTENDED Latest Ref Rng & Units 03/24/2020 03/23/2020 03/22/2020  WBC 4.0 - 10.5 K/uL 10.1 11.1(H) 9.8  RBC 4.22 - 5.81 MIL/uL 2.56(L) 2.82(L) 3.26(L)  HGB 13.0 - 17.0 g/dL 7.9(L) 8.7(L) 9.9(L)  HCT 39 - 52 % 25.4(L) 28.4(L) 32.3(L)  PLT 150 - 400 K/uL 151 159 195  NEUTROABS 1.7 - 7.7 K/uL - - -  LYMPHSABS 0.7 - 4.0 K/uL - - -     Body mass index is 22.1 kg/m.  Orders:  No orders of the defined types were placed in this encounter.  No orders of the defined types were placed in this encounter.    Procedures: No procedures performed  Clinical Data: No additional findings.  ROS:  All other systems negative, except as noted in the HPI. Review of Systems  Objective: Vital Signs: Ht 5\' 10"  (1.778 m)   Wt 154 lb (69.9 kg)   BMI 22.10 kg/m   Specialty Comments:  No specialty comments available.  PMFS History: Patient Active Problem List   Diagnosis Date Noted  .  Acquired thrombophilia ([place]) 03/23/2020  . Hip fracture ([place]) 03/21/2020  . DNR (do not resuscitate) 03/21/2020  . Urinary urgency 01/01/2019  . Candidiasis 01/01/2019  . Closed displaced intertrochanteric fracture of left femur ([place]) 12/25/2018  . Closed intertrochanteric fracture of hip, left, initial encounter ([place]) 12/25/2018  . Pain due to onychomycosis of toenails of both feet 12/12/2018  . Acute blood loss anemia 06/08/2018  . GI bleed 06/05/2018  . HTN (hypertension)   . High cholesterol   . Diverticulosis   . Lower GI bleed   . AAA (abdominal aortic aneurysm) without rupture ([place]) 01/09/2018  . Ascending aortic aneurysm ([place]) 01/09/2018  . Heart block AV second degree 07/15/2017  . Status post three vessel coronary artery bypass 07/15/2017  . Left ventricular systolic  dysfunction without heart failure 05/11/2017  . Hypercholesterolemia 05/11/2017  . CHB (complete heart block) ([place]) 06/07/2015  . Alzheimer's disease ([place]) 01/26/2015  . Pacemaker 01/26/2013  . Permanent atrial fibrillation ([place]) 01/26/2013  . Second degree AV block, Mobitz type II 01/26/2013  . Coronary artery disease involving native coronary artery of native heart without angina pectoris 01/26/2013  . Dyspnea 01/26/2013   Past Medical History:  Diagnosis Date  . Atrial fibrillation, permanent ([place])    no longer on AC due to GI bleed  . CAD (coronary artery disease)   . Dementia ([place])   . Diverticulosis    diverticular bleed 2011  . High cholesterol   . HTN (hypertension)    has improved over time, no rx as of 2019  . Lower GI bleed     Family History  Problem Relation Age of Onset  . Stroke Father   . Cervical cancer Other        siblings  . COPD Sister   . Dementia Brother     Past Surgical History:  Procedure Laterality Date  . angiolplasty    . APPENDECTOMY    . INSERT / REPLACE / REMOVE PACEMAKER    . INTRAMEDULLARY (IM) NAIL INTERTROCHANTERIC Left 12/26/2018   Procedure: INTRAMEDULLARY (IM) NAIL INTERTROCHANTRIC;  Surgeon: Leandrew Koyanagi, MD;  Location: [facility];  Service: Orthopedics;  Laterality: Left;  . INTRAMEDULLARY (IM) NAIL INTERTROCHANTERIC Right 03/22/2020   Procedure: INTRAMEDULLARY (IM) NAIL INTERTROCHANTRIC;  Surgeon: Newt Minion, MD;  Location: [facility];  Service: Orthopedics;  Laterality: Right;  . KNEE SURGERY    . PILONIDAL CYST EXCISION    . rotator cuff surgery  1985  . TONSILLECTOMY    . triple heart bypass     Social History   Occupational History  . Occupation: retired  Tobacco Use  . Smoking status: Former Smoker    Quit date: 07/02/1974    Years since quitting: 45.7  . Smokeless tobacco: Never Used  Vaping Use  . Vaping Use: Never used  Substance and Sexual Activity  . Alcohol use: Not Currently    Comment: last used in dec 2019  .  Drug use: No  . Sexual activity: Not on file

## 2020-04-04 ENCOUNTER — Non-Acute Institutional Stay (SKILLED_NURSING_FACILITY): Payer: Medicare Other | Admitting: Nurse Practitioner

## 2020-04-04 ENCOUNTER — Encounter: Payer: Self-pay | Admitting: Nurse Practitioner

## 2020-04-04 DIAGNOSIS — D62 Acute posthemorrhagic anemia: Secondary | ICD-10-CM | POA: Diagnosis not present

## 2020-04-04 DIAGNOSIS — R3915 Urgency of urination: Secondary | ICD-10-CM

## 2020-04-04 DIAGNOSIS — M25551 Pain in right hip: Secondary | ICD-10-CM

## 2020-04-04 DIAGNOSIS — I4821 Permanent atrial fibrillation: Secondary | ICD-10-CM | POA: Diagnosis not present

## 2020-04-04 DIAGNOSIS — F0280 Dementia in other diseases classified elsewhere without behavioral disturbance: Secondary | ICD-10-CM

## 2020-04-04 DIAGNOSIS — R5381 Other malaise: Secondary | ICD-10-CM

## 2020-04-04 DIAGNOSIS — G309 Alzheimer's disease, unspecified: Secondary | ICD-10-CM

## 2020-04-04 DIAGNOSIS — F339 Major depressive disorder, recurrent, unspecified: Secondary | ICD-10-CM | POA: Insufficient documentation

## 2020-04-04 NOTE — Progress Notes (Signed)
Location:   [facility] Room Number: 17 Place of Service:  SNF (31) Provider: Marlana Latus NP  [name], MD  Patient Care Team: [name], MD as PCP - General (Internal Medicine) Croitoru, Dani Gobble, MD as PCP - Cardiology (Cardiology) Virgie Dad, MD as Consulting Physician (Internal Medicine)  Extended Emergency Contact Information Primary Emergency Contact: Marnette Burgess Address: [address]          [place] [address] Johnnette Litter of [place] Phone: [phone] Mobile Phone: [phone] Relation: Spouse Secondary Emergency Contact: Doctor, Sheahan Mobile Phone: [phone] Relation: Son  Code Status: DNR Goals of care: Advanced Directive information Advanced Directives 03/25/2020  Does Patient Have a Medical Advance Directive? Yes  Type of Advance Directive Out of facility DNR (pink MOST or yellow form)  Does patient want to make changes to medical advance directive? No - Patient declined  Copy of [place] in Chart? -  Would patient like information on creating a medical advance directive? -  Pre-existing out of facility DNR order (yellow form or pink MOST form) -     Chief Complaint  Patient presents with  . Acute Visit    Urinary urgency, frequency    HPI:  Pt is a 86 y.o. male seen today for an acute visit for increased  urinary frequency, urgency x 1 day, admitted generalized malaise, denied dysuria, abd pain, fever, or hematuria. Takes Finasteride $RemoveBeforeDEI'5mg'SgPwWtkvLvYOApQu$  qd.   S/p R hip fx IM nailing, WBAT, ASA 21 days, saw Ortho 03/29/20  Afib, no anticoagulation 2nd to GI bleed/fall  Dementia,  takes Donepezil  IDA on Fe, pending CBC  Depression, takes Sertraline.     Past Medical History:  Diagnosis Date  . Atrial fibrillation, permanent ([place])    no longer on AC due to GI bleed  . CAD (coronary artery disease)   . Dementia ([place])   . Diverticulosis    diverticular bleed 2011  . High cholesterol   .  HTN (hypertension)    has improved over time, no rx as of 2019  . Lower GI bleed    Past Surgical History:  Procedure Laterality Date  . angiolplasty    . APPENDECTOMY    . INSERT / REPLACE / REMOVE PACEMAKER    . INTRAMEDULLARY (IM) NAIL INTERTROCHANTERIC Left 12/26/2018   Procedure: INTRAMEDULLARY (IM) NAIL INTERTROCHANTRIC;  Surgeon: Leandrew Koyanagi, MD;  Location: [facility];  Service: Orthopedics;  Laterality: Left;  . INTRAMEDULLARY (IM) NAIL INTERTROCHANTERIC Right 03/22/2020   Procedure: INTRAMEDULLARY (IM) NAIL INTERTROCHANTRIC;  Surgeon: Newt Minion, MD;  Location: [facility];  Service: Orthopedics;  Laterality: Right;  . KNEE SURGERY    . PILONIDAL CYST EXCISION    . rotator cuff surgery  1985  . TONSILLECTOMY    . triple heart bypass      Allergies  Allergen Reactions  . Amiodarone Other (See Comments)    Had a severe lung infection in 2003  . Antihistamines, Diphenhydramine-Type Other (See Comments)    Jittery    Allergies as of 04/04/2020      Reactions   Amiodarone Other (See Comments)   Had a severe lung infection in 2003   Antihistamines, Diphenhydramine-type Other (See Comments)   Jittery      Medication List       Accurate as of April 04, 2020 11:59 PM. If you have any questions, ask your nurse or doctor.        STOP taking these medications  MILK OF MAGNESIA PO Stopped by: Shiara Mcgough X Beva Remund, NP     TAKE these medications   acetaminophen 500 MG tablet Commonly known as: TYLENOL Take 1 tablet (500 mg total) by mouth every 6 (six) hours as needed for mild pain.   aspirin EC 81 MG tablet Take 81 mg by mouth daily. Swallow whole.   aspirin 325 MG EC tablet Take 1 tablet (325 mg total) by mouth daily with breakfast for 21 days.   atorvastatin 40 MG tablet Commonly known as: LIPITOR Take 40 mg by mouth every evening.   bisacodyl 5 MG EC tablet Commonly known as: DULCOLAX Take 1 tablet (5 mg total) by mouth daily as needed for moderate constipation.     docusate sodium 100 MG capsule Commonly known as: COLACE Take 1 capsule (100 mg total) by mouth 2 (two) times daily.   donepezil 5 MG tablet Commonly known as: ARICEPT Take 1 tablet (5 mg total) by mouth 2 (two) times daily.   ferrous sulfate 325 (65 FE) MG EC tablet Take 325 mg by mouth daily. Monday, Wednesday and Friday morning.   finasteride 5 MG tablet Commonly known as: PROSCAR Take 5 mg by mouth every evening.   HYDROcodone-acetaminophen 5-325 MG tablet Commonly known as: NORCO/VICODIN Take 1-2 tablets by mouth every 4 (four) hours as needed for moderate pain (pain score 4-6).   multivitamin with minerals Tabs tablet Take 1 tablet by mouth daily.   polyethylene glycol 17 g packet Commonly known as: MIRALAX / GLYCOLAX Take 17 g by mouth daily as needed for mild constipation.   sertraline 25 MG tablet Commonly known as: ZOLOFT Take 25 mg by mouth daily.   Vitamin D 50 MCG (2000 UT) Caps Take 2,000 Units by mouth at bedtime.       Review of Systems  Constitutional: Positive for activity change, appetite change and fatigue. Negative for chills, diaphoresis and fever.  HENT: Positive for hearing loss. Negative for congestion and voice change.   Respiratory: Negative for cough and shortness of breath.   Cardiovascular: Negative for leg swelling.  Gastrointestinal: Negative for abdominal pain, constipation, diarrhea, nausea and vomiting.  Genitourinary: Positive for frequency and urgency. Negative for difficulty urinating, dysuria and hematuria.  Musculoskeletal: Positive for arthralgias and gait problem.       R hip pain  Skin: Positive for rash.       Redness in perineal area. Surgical incision left hip, lateral left hip, lateral above the left knee-intact, no s/s of peri wound cellulitis.   Neurological: Negative for speech difficulty, weakness, light-headedness and headaches.       Dementia  Psychiatric/Behavioral: Negative for behavioral problems and sleep  disturbance. The patient is not nervous/anxious.        Flat affect, low voice    Immunization History  Administered Date(s) Administered  . Influenza, High Dose Seasonal PF 03/27/2019  . Influenza-Unspecified 03/27/2016, 03/19/2018  . PFIZER SARS-COV-2 Vaccination 07/17/2019, 08/07/2019  . Pneumococcal Conjugate-13 02/24/2014  . Pneumococcal Polysaccharide-23 04/25/2018  . Td 05/27/2012, 10/27/2016   Pertinent  Health Maintenance Due  Topic Date Due  . INFLUENZA VACCINE  01/31/2020  . PNA vac Low Risk Adult  Completed   Fall Risk  07/16/2018  Falls in the past year? 0   Functional Status Survey:    Vitals:   04/04/20 1330  BP: (!) 110/52  Pulse: 65  Resp: 18  Temp: 99.8 F (37.7 C)  SpO2: 92%  Weight: 156 lb 4.8 oz (70.9 kg)  Height: '5\' 10"'$  (1.778 m)   Body mass index is 22.43 kg/m. Physical Exam Constitutional:      General: He is not in acute distress.    Appearance: He is not ill-appearing, toxic-appearing or diaphoretic.     Comments: Appears tired.   HENT:     Head: Normocephalic and atraumatic.     Nose: Nose normal.     Mouth/Throat:     Mouth: Mucous membranes are moist.  Eyes:     Extraocular Movements: Extraocular movements intact.     Conjunctiva/sclera: Conjunctivae normal.     Pupils: Pupils are equal, round, and reactive to light.  Cardiovascular:     Rate and Rhythm: Normal rate and regular rhythm.     Heart sounds: No murmur heard.   Pulmonary:     Effort: Pulmonary effort is normal.     Breath sounds: No rales.  Abdominal:     General: Bowel sounds are normal.     Palpations: Abdomen is soft.     Tenderness: There is no abdominal tenderness. There is no right CVA tenderness, left CVA tenderness, guarding or rebound.  Musculoskeletal:     Cervical back: Normal range of motion and neck supple.     Right lower leg: No edema.     Left lower leg: No edema.  Skin:    General: Skin is warm and dry.  Neurological:     General: No focal  deficit present.     Mental Status: He is alert. Mental status is at baseline.     Gait: Gait abnormal.     Comments: Oriented to person and place.   Psychiatric:        Behavior: Behavior normal.        Thought Content: Thought content normal.     Comments: Flat affect     Labs reviewed: Recent Labs    03/22/20 0820 03/23/20 0958 03/24/20 0456  NA 139 136 135  K 4.8 3.9 4.2  CL 106 103 105  CO2 $Re'23 25 23  'vyj$ GLUCOSE 110* 137* 93  BUN 30* 32* 44*  CREATININE 1.56* 1.72* 1.71*  CALCIUM 9.1 8.6* 8.6*   No results for input(s): AST, ALT, ALKPHOS, BILITOT, PROT, ALBUMIN in the last 8760 hours. Recent Labs    03/21/20 1202 03/21/20 1202 03/22/20 0820 03/23/20 0958 03/24/20 0456  WBC 6.3   < > 9.8 11.1* 10.1  NEUTROABS 3.8  --   --   --   --   HGB 11.5*   < > 9.9* 8.7* 7.9*  HCT 37.4*   < > 32.3* 28.4* 25.4*  MCV 101.9*   < > 99.1 100.7* 99.2  PLT 209   < > 195 159 151   < > = values in this interval not displayed.   No results found for: TSH Lab Results  Component Value Date   HGBA1C  12/13/2008    5.5 (NOTE) The ADA recommends the following therapeutic goal for glycemic control related to Hgb A1c measurement: Goal of therapy: <6.5 Hgb A1c  Reference: American Diabetes Association: Clinical Practice Recommendations 2010, Diabetes Care, 2010, 33: (Suppl  1).   No results found for: CHOL, HDL, LDLCALC, LDLDIRECT, TRIG, CHOLHDL  Significant Diagnostic Results in last 30 days:  DG Chest 1 View  Result Date: 03/21/2020 CLINICAL DATA:  [place] and fractured right hip. EXAM: CHEST  1 VIEW COMPARISON:  Chest x-ray 12/25/2018 FINDINGS: The right atrial and right ventricular pacer wires are in good position, unchanged. The heart  is normal in size. Stable tortuosity and calcification of the thoracic aorta. Stable surgical changes from bypass surgery. Chronic bronchitic type interstitial lung changes but no acute pulmonary findings. No pleural effusion or pneumothorax. The bony thorax  is grossly intact. IMPRESSION: Chronic lung changes but no acute pulmonary findings. Electronically Signed   By: Marijo Sanes M.D.   On: 03/21/2020 12:54   CT HEAD WO CONTRAST  Result Date: 03/21/2020 CLINICAL DATA:  Pain following fall EXAM: CT HEAD WITHOUT CONTRAST CT CERVICAL SPINE WITHOUT CONTRAST TECHNIQUE: Multidetector CT imaging of the head and cervical spine was performed following the standard protocol without intravenous contrast. Multiplanar CT image reconstructions of the cervical spine were also generated. COMPARISON:  CT head; CT cervical spine June 05, 2018 FINDINGS: CT HEAD FINDINGS Brain: Moderate diffuse atrophy is stable. There is no intracranial mass, hemorrhage, extra-axial fluid collection, or midline shift. There is patchy small vessel disease in the centra semiovale bilaterally. No acute infarct is appreciable. Vascular: No hyperdense vessel. There is calcification in each carotid siphon region. Skull: Bony calvarium appears intact. Sinuses/Orbits: There is mucosal thickening in several ethmoid air cells. Other paranasal sinuses are clear. Orbits appear symmetric bilaterally. Other: Mastoid air cells are clear. CT CERVICAL SPINE FINDINGS Alignment: There is 3 mm of anterolisthesis of C7 on T1, stable. There is no new spondylolisthesis. Skull base and vertebrae: There is pannus posterior to the odontoid without impression on the craniocervical junction. There is extensive erosion in the odontoid, stable from prior study. Bones are osteoporotic. No acute fracture is appreciable. No blastic or lytic bone lesions are evident. Soft tissues and spinal canal: Prevertebral soft tissues and predental space regions are normal. There is no appreciable cord or canal hematoma. No paraspinous lesions. Disc levels: There is severe disc space narrowing at C4-5, C5-6, and C7-T1. There is ankylosis at C6-7 and T1-2, stable. There is facet osteoarthritic change to varying degrees at all levels. Exit  foraminal narrowing is noted at C3-4 bilaterally CT and at C5-6 bilaterally with impression on exiting nerve roots. A lesser degree of arthropathy is noted at other levels. No disc extrusion or high-grade stenosis. Note that there is moderate broad-based disc bulging at C5-6 and C6-7. Upper chest: There is emphysematous change in the upper lobes. No edema or consolidation noted in the upper lobe regions. Other: There are foci of subclavian and carotid artery calcification bilaterally. IMPRESSION: CT head: Stable atrophy with periventricular small vessel disease. No mass or hemorrhage. No extra-axial fluid collection. No acute infarct. There are foci of arterial vascular calcification. There is mucosal thickening in several ethmoid air cells. CT cervical spine: Diffuse osteoporosis. No fracture. Stable 3 mm of anterolisthesis of C7 on T1. No new spondylolisthesis. Multilevel arthropathy, essentially stable. Impression on exiting nerve roots, most severe at C3-4 and C5-6 bilaterally. No frank disc extrusion or high-grade stenosis. Underlying emphysematous change. Foci of subclavian and carotid artery calcification noted bilaterally. Electronically Signed   By: Lowella Grip III M.D.   On: 03/21/2020 13:29   CT CERVICAL SPINE WO CONTRAST  Result Date: 03/21/2020 CLINICAL DATA:  Pain following fall EXAM: CT HEAD WITHOUT CONTRAST CT CERVICAL SPINE WITHOUT CONTRAST TECHNIQUE: Multidetector CT imaging of the head and cervical spine was performed following the standard protocol without intravenous contrast. Multiplanar CT image reconstructions of the cervical spine were also generated. COMPARISON:  CT head; CT cervical spine June 05, 2018 FINDINGS: CT HEAD FINDINGS Brain: Moderate diffuse atrophy is stable. There is no intracranial mass, hemorrhage,  extra-axial fluid collection, or midline shift. There is patchy small vessel disease in the centra semiovale bilaterally. No acute infarct is appreciable. Vascular:  No hyperdense vessel. There is calcification in each carotid siphon region. Skull: Bony calvarium appears intact. Sinuses/Orbits: There is mucosal thickening in several ethmoid air cells. Other paranasal sinuses are clear. Orbits appear symmetric bilaterally. Other: Mastoid air cells are clear. CT CERVICAL SPINE FINDINGS Alignment: There is 3 mm of anterolisthesis of C7 on T1, stable. There is no new spondylolisthesis. Skull base and vertebrae: There is pannus posterior to the odontoid without impression on the craniocervical junction. There is extensive erosion in the odontoid, stable from prior study. Bones are osteoporotic. No acute fracture is appreciable. No blastic or lytic bone lesions are evident. Soft tissues and spinal canal: Prevertebral soft tissues and predental space regions are normal. There is no appreciable cord or canal hematoma. No paraspinous lesions. Disc levels: There is severe disc space narrowing at C4-5, C5-6, and C7-T1. There is ankylosis at C6-7 and T1-2, stable. There is facet osteoarthritic change to varying degrees at all levels. Exit foraminal narrowing is noted at C3-4 bilaterally CT and at C5-6 bilaterally with impression on exiting nerve roots. A lesser degree of arthropathy is noted at other levels. No disc extrusion or high-grade stenosis. Note that there is moderate broad-based disc bulging at C5-6 and C6-7. Upper chest: There is emphysematous change in the upper lobes. No edema or consolidation noted in the upper lobe regions. Other: There are foci of subclavian and carotid artery calcification bilaterally. IMPRESSION: CT head: Stable atrophy with periventricular small vessel disease. No mass or hemorrhage. No extra-axial fluid collection. No acute infarct. There are foci of arterial vascular calcification. There is mucosal thickening in several ethmoid air cells. CT cervical spine: Diffuse osteoporosis. No fracture. Stable 3 mm of anterolisthesis of C7 on T1. No new  spondylolisthesis. Multilevel arthropathy, essentially stable. Impression on exiting nerve roots, most severe at C3-4 and C5-6 bilaterally. No frank disc extrusion or high-grade stenosis. Underlying emphysematous change. Foci of subclavian and carotid artery calcification noted bilaterally. Electronically Signed   By: Bretta Bang III M.D.   On: 03/21/2020 13:29   DG C-Arm 1-60 Min-No Report  Result Date: 03/22/2020 Fluoroscopy was utilized by the requesting physician.  No radiographic interpretation.   DG Hip Unilat With Pelvis 2-3 Views Right  Result Date: 03/21/2020 CLINICAL DATA:  Fall, right hip pain EXAM: DG HIP (WITH OR WITHOUT PELVIS) 2-3V RIGHT COMPARISON:  12/25/2018 FINDINGS: Right femoral intertrochanteric fracture noted. Minimal displacement. No significant angulation. No no subluxation or dislocation. Hardware seen within the left proximal femur. IMPRESSION: Right femoral intertrochanteric fracture. Electronically Signed   By: Charlett Nose M.D.   On: 03/21/2020 12:53   XR HIP UNILAT W OR W/O PELVIS 2-3 VIEWS RIGHT  Result Date: 04/05/2020 Radiographs of right hip show stable alignment of fixation hardware. No complicating feature.   Assessment/Plan: Urinary urgency Increased urinary frequency, urgency in setting of generalized malaise, will update UA C/S to r/o UTI, update CBC/diff, CMP/eGFR  Acute blood loss anemia Continue Fe daily, update CBC/diff.   Alzheimer's disease (HCC) No behavioral issues, continue SNF [facility] for therapy, care assistance, continue Donepezil.   Permanent atrial fibrillation (HCC) Heart rate is in control, not anticoagulated due to hx of GI bleed, frequent falls.   Right hip pain S/p R hip fx IM nailing, WBAT, ASA 21 days, saw Ortho 03/29/20   Depression, recurrent (HCC) Flat affect, low voice, desires dark room,  continue Sertraline for now.   Malaise ? UTI vs post anemia vs depression, will update CBC/diff, CMP/eGFR to evaluate further.  Observe.     Family/ staff Communication: plan of care reviewed with the patient and charge nurse.   Labs/tests ordered:  CBC/diff, CMP/eGFR, UA C/S  Time spend 35 minutes.

## 2020-04-04 NOTE — Assessment & Plan Note (Signed)
Heart rate is in control, not anticoagulated due to hx of GI bleed, frequent falls.

## 2020-04-04 NOTE — Assessment & Plan Note (Signed)
S/p R hip fx IM nailing, WBAT, ASA 21 days, saw Ortho 03/29/20

## 2020-04-04 NOTE — Assessment & Plan Note (Signed)
?  UTI vs post anemia vs depression, will update CBC/diff, CMP/eGFR to evaluate further. Observe.

## 2020-04-04 NOTE — Assessment & Plan Note (Signed)
No behavioral issues, continue SN[facility] for therapy, care assistance, continue Donepezil.

## 2020-04-04 NOTE — Assessment & Plan Note (Signed)
Increased urinary frequency, urgency in setting of generalized malaise, will update UA C/S to r/o UTI, update CBC/diff, CMP/eGFR

## 2020-04-04 NOTE — Assessment & Plan Note (Signed)
Continue Fe daily, update CBC/diff.

## 2020-04-04 NOTE — Assessment & Plan Note (Signed)
Flat affect, low voice, desires dark room, continue Sertraline for now.

## 2020-04-05 ENCOUNTER — Encounter: Payer: Self-pay | Admitting: Family

## 2020-04-05 ENCOUNTER — Ambulatory Visit: Payer: Self-pay

## 2020-04-05 ENCOUNTER — Encounter: Payer: Self-pay | Admitting: Nurse Practitioner

## 2020-04-05 ENCOUNTER — Ambulatory Visit (INDEPENDENT_AMBULATORY_CARE_PROVIDER_SITE_OTHER): Payer: Medicare Other | Admitting: Family

## 2020-04-05 VITALS — Ht 70.0 in | Wt 156.0 lb

## 2020-04-05 DIAGNOSIS — S72142D Displaced intertrochanteric fracture of left femur, subsequent encounter for closed fracture with routine healing: Secondary | ICD-10-CM | POA: Diagnosis not present

## 2020-04-05 DIAGNOSIS — S72145D Nondisplaced intertrochanteric fracture of left femur, subsequent encounter for closed fracture with routine healing: Secondary | ICD-10-CM

## 2020-04-05 LAB — BASIC METABOLIC PANEL
BUN: 37 — AB (ref 4–21)
CO2: 22 (ref 13–22)
Chloride: 101 (ref 99–108)
Creatinine: 1.5 — AB (ref 0.6–1.3)
Glucose: 136
Potassium: 4.2 (ref 3.4–5.3)
Sodium: 136 — AB (ref 137–147)

## 2020-04-05 LAB — CBC AND DIFFERENTIAL
HCT: 29 — AB (ref 41–53)
Hemoglobin: 9.5 — AB (ref 13.5–17.5)
Neutrophils Absolute: 9576
Platelets: 562 — AB (ref 150–399)
WBC: 13.1

## 2020-04-05 LAB — HEPATIC FUNCTION PANEL
ALT: 12 (ref 10–40)
AST: 27 (ref 14–40)
Alkaline Phosphatase: 139 — AB (ref 25–125)
Bilirubin, Total: 0.8

## 2020-04-05 LAB — COMPREHENSIVE METABOLIC PANEL
Albumin: 3.2 — AB (ref 3.5–5.0)
Calcium: 9.2 (ref 8.7–10.7)
GFR calc Af Amer: 45
GFR calc non Af Amer: 39
Globulin: 4.2

## 2020-04-05 LAB — CBC: RBC: 3.07 — AB (ref 3.87–5.11)

## 2020-04-05 NOTE — Progress Notes (Signed)
Office Visit Note   Patient: [name] [name]           Date of Birth: 09-23-1925           MRN: [id number] Visit Date: 04/05/2020              Requested by: [name], MD [address] [place],  [place] [address] PCP: [name], MD  No chief complaint on file.     HPI: Patient is 2 weeks status post ORIF right IM nail inter trochanteric hip fracture.  He is at friend's home and is accompanied today by his wife.  He has begun placing a little weight on his right leg.    Assessment & Plan: Visit Diagnoses: No diagnosis found.  Plan: staples harvested. Will continue with PT, WBAT on RLE. Recommended foam dressing and close diligence to prevent further sores. Continue to work on mobilization.  Per wife prior to this fracture he was ambulatory with a cane.    Follow-Up Instructions: No follow-ups on file.   Ortho Exam  Patient is alert, oriented, no adenopathy, well-dressed, normal affect, normal respiratory effort. No drainage no erythema surgical wound is well approximated. Healing well. Staples harvested without incident.  no evidence of infection  Imaging: No results found. No images are attached to the encounter.  Labs: Lab Results  Component Value Date   HGBA1C  12/13/2008    5.5 (NOTE) The ADA recommends the following therapeutic goal for glycemic control related to Hgb A1c measurement: Goal of therapy: <6.5 Hgb A1c  Reference: American Diabetes Association: Clinical Practice Recommendations 2010, Diabetes Care, 2010, 33: (Suppl  1).   REPTSTATUS 06/10/2018 FINAL 06/05/2018   CULT  06/05/2018    NO GROWTH 5 DAYS Performed at [facility], [place] [address], [place], [place] [address]      Lab Results  Component Value Date   ALBUMIN 3.1 (L) 12/25/2018   ALBUMIN 2.3 (L) 06/08/2018   ALBUMIN 2.6 (L) 06/05/2018    Lab Results  Component Value Date   MG 2.2 12/16/2008   MG 2.5 12/15/2008   No results found for: VD25OH  No results  found for: PREALBUMIN CBC EXTENDED Latest Ref Rng & Units 03/24/2020 03/23/2020 03/22/2020  WBC 4.0 - 10.5 K/uL 10.1 11.1(H) 9.8  RBC 4.22 - 5.81 MIL/uL 2.56(L) 2.82(L) 3.26(L)  HGB 13.0 - 17.0 g/dL 7.9(L) 8.7(L) 9.9(L)  HCT 39 - 52 % 25.4(L) 28.4(L) 32.3(L)  PLT 150 - 400 K/uL 151 159 195  NEUTROABS 1.7 - 7.7 K/uL - - -  LYMPHSABS 0.7 - 4.0 K/uL - - -     There is no height or weight on file to calculate BMI.  Orders:  No orders of the defined types were placed in this encounter.  No orders of the defined types were placed in this encounter.    Procedures: No procedures performed  Clinical Data: No additional findings.  ROS:  All other systems negative, except as noted in the HPI. Review of Systems  Constitutional: Negative for chills and fever.  Musculoskeletal: Positive for arthralgias and myalgias.  Skin: Negative for wound.    Objective: Vital Signs: There were no vitals taken for this visit.  Specialty Comments:  No specialty comments available.  PMFS History: Patient Active Problem List   Diagnosis Date Noted   Right hip pain 04/04/2020   Depression, recurrent ([place]) 04/04/2020   Malaise 04/04/2020   Acquired thrombophilia ([place]) 03/23/2020   Hip fracture ([place])  03/21/2020   DNR (do not resuscitate) 03/21/2020   Urinary urgency 01/01/2019   Candidiasis 01/01/2019   Closed displaced intertrochanteric fracture of left femur ([place]) 12/25/2018   Closed intertrochanteric fracture of hip, left, initial encounter ([place]) 12/25/2018   Pain due to onychomycosis of toenails of both feet 12/12/2018   Acute blood loss anemia 06/08/2018   GI bleed 06/05/2018   HTN (hypertension)    High cholesterol    Diverticulosis    Lower GI bleed    AAA (abdominal aortic aneurysm) without rupture ([place]) 01/09/2018   Ascending aortic aneurysm (HCC) 01/09/2018   Heart block AV second degree 07/15/2017   Status post three vessel coronary artery bypass 07/15/2017     Left ventricular systolic dysfunction without heart failure 05/11/2017   Hypercholesterolemia 05/11/2017   CHB (complete heart block) ([place]) 06/07/2015   Alzheimer's disease ([place]) 01/26/2015   Pacemaker 01/26/2013   Permanent atrial fibrillation ([place]) 01/26/2013   Second degree AV block, Mobitz type II 01/26/2013   Coronary artery disease involving native coronary artery of native heart without angina pectoris 01/26/2013   Dyspnea 01/26/2013   Past Medical History:  Diagnosis Date   Atrial fibrillation, permanent ([place])    no longer on AC due to GI bleed   CAD (coronary artery disease)    Dementia (HCC)    Diverticulosis    diverticular bleed 2011   High cholesterol    HTN (hypertension)    has improved over time, no rx as of 2019   Lower GI bleed     Family History  Problem Relation Age of Onset   Stroke Father    Cervical cancer Other        siblings   COPD Sister    Dementia Brother     Past Surgical History:  Procedure Laterality Date   angiolplasty     APPENDECTOMY     INSERT / REPLACE / REMOVE PACEMAKER     INTRAMEDULLARY (IM) NAIL INTERTROCHANTERIC Left 12/26/2018   Procedure: INTRAMEDULLARY (IM) NAIL INTERTROCHANTRIC;  Surgeon: Leandrew Koyanagi, MD;  Location: [facility];  Service: Orthopedics;  Laterality: Left;   INTRAMEDULLARY (IM) NAIL INTERTROCHANTERIC Right 03/22/2020   Procedure: INTRAMEDULLARY (IM) NAIL INTERTROCHANTRIC;  Surgeon: Newt Minion, MD;  Location: [facility];  Service: Orthopedics;  Laterality: Right;   KNEE SURGERY     PILONIDAL CYST EXCISION     rotator cuff surgery  1985   TONSILLECTOMY     triple heart bypass     Social History   Occupational History   Occupation: retired  Tobacco Use   Smoking status: Former Smoker    Quit date: 07/02/1974    Years since quitting: 45.7   Smokeless tobacco: Never Used  Vaping Use   Vaping Use: Never used  Substance and Sexual Activity   Alcohol use: Not Currently     Comment: last used in dec 2019   Drug use: No   Sexual activity: Not on file

## 2020-04-06 ENCOUNTER — Encounter: Payer: Self-pay | Admitting: Nurse Practitioner

## 2020-04-06 DIAGNOSIS — N183 Chronic kidney disease, stage 3 unspecified: Secondary | ICD-10-CM | POA: Insufficient documentation

## 2020-04-07 LAB — CBC AND DIFFERENTIAL
HCT: 26 — AB (ref 41–53)
Hemoglobin: 8.5 — AB (ref 13.5–17.5)
Neutrophils Absolute: 4114
Platelets: 470 — AB (ref 150–399)
WBC: 6.3

## 2020-04-07 LAB — CBC: RBC: 2.75 — AB (ref 3.87–5.11)

## 2020-04-14 LAB — CBC AND DIFFERENTIAL
HCT: 31 — AB (ref 41–53)
Neutrophils Absolute: 4167
Platelets: 409 — AB (ref 150–399)
WBC: 9

## 2020-04-14 LAB — CBC: RBC: 3.28 — AB (ref 3.87–5.11)

## 2020-04-20 ENCOUNTER — Non-Acute Institutional Stay (SKILLED_NURSING_FACILITY): Payer: Medicare Other | Admitting: Nurse Practitioner

## 2020-04-20 ENCOUNTER — Encounter: Payer: Self-pay | Admitting: Nurse Practitioner

## 2020-04-20 DIAGNOSIS — S72001A Fracture of unspecified part of neck of right femur, initial encounter for closed fracture: Secondary | ICD-10-CM | POA: Diagnosis not present

## 2020-04-20 DIAGNOSIS — R3915 Urgency of urination: Secondary | ICD-10-CM | POA: Diagnosis not present

## 2020-04-20 DIAGNOSIS — F0280 Dementia in other diseases classified elsewhere without behavioral disturbance: Secondary | ICD-10-CM

## 2020-04-20 DIAGNOSIS — I4821 Permanent atrial fibrillation: Secondary | ICD-10-CM

## 2020-04-20 DIAGNOSIS — R634 Abnormal weight loss: Secondary | ICD-10-CM

## 2020-04-20 DIAGNOSIS — D62 Acute posthemorrhagic anemia: Secondary | ICD-10-CM | POA: Diagnosis not present

## 2020-04-20 DIAGNOSIS — N1832 Chronic kidney disease, stage 3b: Secondary | ICD-10-CM

## 2020-04-20 DIAGNOSIS — K219 Gastro-esophageal reflux disease without esophagitis: Secondary | ICD-10-CM

## 2020-04-20 DIAGNOSIS — K5901 Slow transit constipation: Secondary | ICD-10-CM | POA: Insufficient documentation

## 2020-04-20 DIAGNOSIS — G309 Alzheimer's disease, unspecified: Secondary | ICD-10-CM

## 2020-04-20 DIAGNOSIS — F339 Major depressive disorder, recurrent, unspecified: Secondary | ICD-10-CM

## 2020-04-20 NOTE — Assessment & Plan Note (Signed)
Fatigue, poor appetite, continue Sertraline, may add Mirtazapine in setting of weight loss.

## 2020-04-20 NOTE — Assessment & Plan Note (Addendum)
Weight loss about #15Ibs in 2 weeks. Mirtazapine 7.24m started, update CBC/diff, CMP/eGFR, TSH

## 2020-04-20 NOTE — Assessment & Plan Note (Signed)
Dementia,  takes Donepezil, daily ASA 81mg 

## 2020-04-20 NOTE — Assessment & Plan Note (Signed)
Heart rate is controlled, no anticoagulation 2nd to GI bleed/fall

## 2020-04-20 NOTE — Progress Notes (Addendum)
Location:   [facility] Room Number: 17 Place of Service:  SNF [phone]) Provider:  Leafy Kindle, NP  [name], MD  Patient Care Team: [name], MD as PCP - General (Internal Medicine) [name], MD as PCP - Cardiology (Cardiology) Virgie Dad, MD as Consulting Physician (Internal Medicine)  Extended Emergency Contact Information Primary Emergency Contact: Marnette Burgess Address: [address]          [place] [address] John[place]te Litter of [place] Phone: [phone] Mobile Phone: [phone] Relation: Spouse Secondary Emergency Contact: Aki, [place]al[place] Mobile Phone: [phone] Relation: Son  Code Status:  DNR Goals of care: Advanced Directive information Advanced Directives 04/20/2020  Does Patient Have a Medical Advance Directive? Yes  Type of Advance Directive Out of facility DNR (pink MOST or yellow form)  Does patient want to make changes to medical advance directive? No - Patient declined  Copy of [place] in Chart? -  Would patient like information on creating a medical advance directive? -  Pre-existing out of facility DNR order (yellow form or pink MOST form) Yellow form placed in chart (order not valid for inpatient use);Pink MOST form placed in chart (order not valid for inpatient use)     Chief Complaint  Patient presents with  . Medical Management of Chronic Issues    Routine follow up visit    HPI:  Pt is a 86 y.o. male seen today for medical management of chronic diseases.    Urinary frequency, takes Finasteride 26m qd.              S/p R hip fx IM nailing, healed,  WBAT, saw Ortho 04/05/20             Afib, no anticoagulation 2nd to GI bleed/fall             Dementia,  takes Donepezil, daily ASA 86m             IDA on Fe,              Depression, takes Sertraline.   Constipation, takes Colace bid, prn MiraLax, Bisacodyl 66m40mrn.      Past Medical History:  Diagnosis Date  .  Atrial fibrillation, permanent ([facility]  no longer on AC due to GI bleed  . CAD (coronary artery disease)   . Dementia ([facility] . Diverticulosis    diverticular bleed 2011  . High cholesterol   . HTN (hypertension)    has improved over time, no rx as of 2019  . Lower GI bleed    Past Surgical History:  Procedure Laterality Date  . angiolplasty    . APPENDECTOMY    . INSERT / REPLACE / REMOVE PACEMAKER    . INTRAMEDULLARY (IM) NAIL INTERTROCHANTERIC Left 12/26/2018   Procedure: INTRAMEDULLARY (IM) NAIL INTERTROCHANTRIC;  Surgeon: Xu,Leandrew KoyanagiD;  [facility];  Laterality: Left;  . INTRAMEDULLARY (IM) NAIL INTERTROCHANTERIC Right 03/22/2020   Procedure: INTRAMEDULLARY (IM) NAIL INTERTROCHANTRIC;  Surgeon: DudNewt MinionD;  [facility];  Laterality: Right;  . KNEE SURGERY    . PILONIDAL CYST EXCISION    . rotator cuff surgery  1985  . TONSILLECTOMY    . triple heart bypass      Allergies  Allergen Reactions  . Amiodarone Other (See Comments)    Had a severe lung infection in 2003  . Antihistamines, Diphenhydramine-Type Other (See Comments)  Jittery    Allergies as of 04/20/2020      Reactions   Amiodarone Other (See Comments)   Had a severe lung infection in 2003   Antihistamines, Diphenhydramine-type Other (See Comments)   Jittery      Medication List       Accurate as of April 20, 2020 11:59 PM. If you have any questions, ask your nurse or doctor.        acetaminophen 500 MG tablet Commonly known as: TYLENOL Take 1 tablet (500 mg total) by mouth every 6 (six) hours as needed for mild pain.   aspirin EC 81 MG tablet Take 81 mg by mouth daily. Swallow whole.   atorvastatin 40 MG tablet Commonly known as: LIPITOR Take 40 mg by mouth every evening.   bisacodyl 5 MG EC tablet Commonly known as: DULCOLAX Take 1 tablet (5 mg total) by mouth daily as needed for moderate constipation.   docusate sodium  100 MG capsule Commonly known as: COLACE Take 1 capsule (100 mg total) by mouth 2 (two) times daily.   donepezil 5 MG tablet Commonly known as: ARICEPT Take 1 tablet (5 mg total) by mouth 2 (two) times daily.   ferrous sulfate 325 (65 FE) MG EC tablet Take 325 mg by mouth daily. Monday, Wednesday and Friday morning.   finasteride 5 MG tablet Commonly known as: PROSCAR Take 5 mg by mouth every evening.   HYDROcodone-acetaminophen 5-325 MG tablet Commonly known as: NORCO/VICODIN Take 1-2 tablets by mouth every 4 (four) hours as needed for moderate pain (pain score 4-6). What changed: how much to take   multivitamin with minerals Tabs tablet Take 1 tablet by mouth daily.   polyethylene glycol 17 g packet Commonly known as: MIRALAX / GLYCOLAX Take 17 g by mouth daily as needed for mild constipation.   sertraline 25 MG tablet Commonly known as: ZOLOFT Take 25 mg by mouth daily.   Vitamin D 50 MCG (2000 UT) Caps Take 2,000 Units by mouth at bedtime.       Review of Systems  Constitutional: Positive for appetite change, fatigue and unexpected weight change. Negative for fever.       Weight loss about #15Ibs in 2 weeks.   HENT: Positive for hearing loss. Negative for congestion and voice change.   Respiratory: Negative for cough.   Cardiovascular: Negative for leg swelling.  Gastrointestinal: Negative for abdominal pain, constipation, nausea and vomiting.  Genitourinary: Positive for frequency and urgency. Negative for difficulty urinating, dysuria and hematuria.  Musculoskeletal: Positive for arthralgias and gait problem.       R hip pain  Skin: Negative for color change.  Neurological: Negative for dizziness, speech difficulty and weakness.       Dementia  Psychiatric/Behavioral: Negative for behavioral problems and sleep disturbance. The patient is not nervous/anxious.        Flat affect, low voice    Immunization History  Administered Date(s) Administered  .  Influenza, High Dose Seasonal PF 03/27/2019  . Influenza-Unspecified 03/27/2016, 03/19/2018, 04/12/2020  . PFIZER SARS-COV-2 Vaccination 07/17/2019, 08/07/2019  . Pneumococcal Conjugate-13 02/24/2014  . Pneumococcal Polysaccharide-23 04/25/2018  . Td 05/27/2012, 10/27/2016   Pertinent  Health Maintenance Due  Topic Date Due  . INFLUENZA VACCINE  Completed  . PNA vac Low Risk Adult  Completed   Fall Risk  07/16/2018  Falls in the past year? 0   Functional Status Survey:    Vitals:   04/20/20 0904  BP: 122/68  Pulse: 72  Resp: (!) 26  Temp: 98.6 F (37 C)  SpO2: 90%  Weight: 141 lb 12.8 oz (64.3 kg)  Height: _0  (1.778 m)   Body mass index is 20.35 kg/m. Physical Exam Constitutional:      Comments: Appears tired.   HENT:     Head: Normocephalic and atraumatic.     Nose: Nose normal.     Mouth/Throat:     Mouth: Mucous membranes are moist.  Eyes:     Extraocular Movements: Extraocular movements intact.     Conjunctiva/sclera: Conjunctivae normal.     Pupils: Pupils are equal, round, and reactive to light.  Cardiovascular:     Rate and Rhythm: Normal rate and regular rhythm.     Heart sounds: No murmur heard.   Pulmonary:     Effort: Pulmonary effort is normal.     Breath sounds: No rales.  Abdominal:     General: Bowel sounds are normal.     Palpations: Abdomen is soft.     Tenderness: There is no abdominal tenderness.  Musculoskeletal:     Cervical back: Normal range of motion and neck supple.     Right lower leg: No edema.     Left lower leg: No edema.  Skin:    General: Skin is warm and dry.  Neurological:     General: No focal deficit present.     Mental Status: He is alert. Mental status is at baseline.     Gait: Gait abnormal.     Comments: Oriented to person and place.   Psychiatric:        Mood and Affect: Mood normal.        Behavior: Behavior normal.        Thought Content: Thought content normal.     Labs reviewed: Recent Labs     03/22/20 0820 03/22/20 0820 03/23/20 0958 03/24/20 0456 04/05/20 0000  NA 139   < > 136 135 136*  K 4.8   < > 3.9 4.2 4.2  CL 106   < > 103 105 101  CO2 23   < > _1 GLUCOSE 110*  --  137* 93  --   BUN 30*   < > 32* 44* 37*  CREATININE 1.56*   < > 1.72* 1.71* 1.5*  CALCIUM 9.1   < > 8.6* 8.6* 9.2   < > = values in this interval not displayed.   Recent Labs    04/05/20 0000  AST 27  ALT 12  ALKPHOS 139*  ALBUMIN 3.2*   Recent Labs    03/22/20 0820 03/22/20 0820 03/23/20 0958 03/23/20 0958 03/24/20 0456 03/24/20 0456 04/05/20 0000 04/07/20 0000 04/14/20 0000  WBC 9.8   < > 11.1*   < > 10.1  --  13.1 6.3 9.0  NEUTROABS  --   --   --   --   --   --  9,576.00 4,114.00 4,167.00  HGB 9.9*   < > 8.7*   < > 7.9*  --  9.5* 8.5*  --   HCT 32.3*   < > 28.4*   < > 25.4*   < > 29* 26* 31*  MCV 99.1  --  100.7*  --  99.2  --   --   --   --   PLT 195   < > 159   < > 151   < > 562* 470* 409*   < > = values in this interval not displayed.  No results found for: TSH Lab Results  Component Value Date   HGBA1C  12/13/2008    5.5 (NOTE) The ADA recommends the following therapeutic goal for glycemic control related to Hgb A1c measurement: Goal of therapy: <6.5 Hgb A1c  Reference: American Diabetes Association: Clinical Practice Recommendations 2010, Diabetes Care, 2010, 33: (Suppl  1).   No results found for: CHOL, HDL, LDLCALC, LDLDIRECT, TRIG, CHOLHDL  Significant Diagnostic Results in last 30 days:  XR HIP UNILAT W OR W/O PELVIS 2-3 VIEWS RIGHT  Result Date: 04/05/2020 Radiographs of right hip show stable alignment of fixation hardware. No complicating feature.   Assessment/Plan Weight loss Weight loss about #15Ibs in 2 weeks. Mirtazapine 7.27m started, update CBC/diff, CMP/eGFR, TSH   Urinary urgency Urinary frequency, takes Finasteride 539mqd.    Hip fracture ([facility] R hip fx IM nailing, healed,  WBAT, saw Ortho 04/05/20   Permanent atrial fibrillation  (HCC) Heart rate is controlled, no anticoagulation 2nd to GI bleed/fall  Alzheimer's disease ([facility] City[place]ementia,  takes Donepezil, daily ASA 8185m               Acute blood loss anemia Hgb 8.5 04/07/20, continue Fe  Depression, recurrent (HCC) Fatigue, poor appetite, continue Sertraline, may add Mirtazapine in setting of weight loss.   Slow transit constipation Constipation, takes Colace bid, prn MiraLax, Bisacodyl 5mg60mn.   GERD (gastroesophageal reflux disease) Per ST eval. The patient's wife: the patient took Ranitidine 75mg78m at home, desires to resume it. Will try Omeprazole 20mg 55mo.   CKD (chronic kidney disease) stage 3, GFR 30-59 ml/min (HCC) 04/05/20 Na 136, K 4.2, Bun 37, creat 1.52, eGFR 39, wbc 13.1, Hgb 9.5, neutrophils 73.1% 04/21/20 Na 138, K 4.7, Bun 30, creat 1.99, eGFR 28, wbc 6.4, Hgb 9.7, plt 269, neutrophils 57.4%, TSH 1. Encourage oral fluid intake, repeat BMP one week.       Family/ staff Communication: plan of care reviewed with the patient and charge nurse.   Labs/tests ordered: CBC/diff, CMP/eGFR, TSH  Time spend 35 minutes.

## 2020-04-20 NOTE — Assessment & Plan Note (Signed)
Hgb 8.5 04/07/20, continue Fe

## 2020-04-20 NOTE — Assessment & Plan Note (Signed)
Urinary frequency, takes Finasteride 5mg  qd.

## 2020-04-20 NOTE — Assessment & Plan Note (Signed)
S/p R hip fx IM nailing, healed,  WBAT, saw Ortho 04/05/20

## 2020-04-20 NOTE — Assessment & Plan Note (Signed)
Constipation, takes Colace bid, prn MiraLax, Bisacodyl 5mg  prn.

## 2020-04-21 ENCOUNTER — Encounter: Payer: Self-pay | Admitting: Nurse Practitioner

## 2020-04-21 DIAGNOSIS — I519 Heart disease, unspecified: Secondary | ICD-10-CM | POA: Diagnosis not present

## 2020-04-21 DIAGNOSIS — I10 Essential (primary) hypertension: Secondary | ICD-10-CM | POA: Diagnosis not present

## 2020-04-21 DIAGNOSIS — R5381 Other malaise: Secondary | ICD-10-CM | POA: Diagnosis not present

## 2020-04-21 DIAGNOSIS — R601 Generalized edema: Secondary | ICD-10-CM | POA: Diagnosis not present

## 2020-04-21 DIAGNOSIS — E860 Dehydration: Secondary | ICD-10-CM | POA: Diagnosis not present

## 2020-04-21 DIAGNOSIS — R946 Abnormal results of thyroid function studies: Secondary | ICD-10-CM | POA: Diagnosis not present

## 2020-04-21 DIAGNOSIS — I4821 Permanent atrial fibrillation: Secondary | ICD-10-CM | POA: Diagnosis not present

## 2020-04-21 DIAGNOSIS — E782 Mixed hyperlipidemia: Secondary | ICD-10-CM | POA: Diagnosis not present

## 2020-04-21 DIAGNOSIS — E039 Hypothyroidism, unspecified: Secondary | ICD-10-CM | POA: Diagnosis not present

## 2020-04-21 DIAGNOSIS — F0390 Unspecified dementia without behavioral disturbance: Secondary | ICD-10-CM | POA: Diagnosis not present

## 2020-04-21 DIAGNOSIS — G301 Alzheimer's disease with late onset: Secondary | ICD-10-CM | POA: Diagnosis not present

## 2020-04-21 DIAGNOSIS — D62 Acute posthemorrhagic anemia: Secondary | ICD-10-CM | POA: Diagnosis not present

## 2020-04-22 ENCOUNTER — Encounter: Payer: Self-pay | Admitting: Nurse Practitioner

## 2020-04-22 DIAGNOSIS — K219 Gastro-esophageal reflux disease without esophagitis: Secondary | ICD-10-CM | POA: Insufficient documentation

## 2020-04-22 LAB — CBC AND DIFFERENTIAL
HCT: 31 — AB (ref 41–53)
HCT: 31 — AB (ref 41–53)
Hemoglobin: 9.7 — AB (ref 13.5–17.5)
Hemoglobin: 9.9 — AB (ref 13.5–17.5)
Neutrophils Absolute: 3674
Neutrophils Absolute: 4167
Platelets: 269 (ref 150–399)
Platelets: 409 — AB (ref 150–399)
WBC: 6.4
WBC: 9

## 2020-04-22 LAB — CBC
RBC: 3.23 — AB (ref 3.87–5.11)
RBC: 3.28 — AB (ref 3.87–5.11)

## 2020-04-22 LAB — COMPREHENSIVE METABOLIC PANEL
Albumin: 2.8 — AB (ref 3.5–5.0)
Calcium: 9.1 (ref 8.7–10.7)
Globulin: 3.7

## 2020-04-22 LAB — BASIC METABOLIC PANEL
BUN: 30 — AB (ref 4–21)
CO2: 26 — AB (ref 13–22)
Chloride: 102 (ref 99–108)
Creatinine: 2 — AB (ref 0.6–1.3)
Glucose: 84
Potassium: 4.7 (ref 3.4–5.3)
Sodium: 138 (ref 137–147)

## 2020-04-22 LAB — HEPATIC FUNCTION PANEL
ALT: 6 — AB (ref 10–40)
AST: 16 (ref 14–40)
Alkaline Phosphatase: 112 (ref 25–125)
Bilirubin, Total: 0.3

## 2020-04-22 LAB — TSH: TSH: 1.62 (ref 0.41–5.90)

## 2020-04-22 NOTE — Assessment & Plan Note (Addendum)
04/05/20 Na 136, K 4.2, Bun 37, creat 1.52, eGFR 39, wbc 13.1, Hgb 9.5, neutrophils 73.1% 04/21/20 Na 138, K 4.7, Bun 30, creat 1.99, eGFR 28, wbc 6.4, Hgb 9.7, plt 269, neutrophils 57.4%, TSH 1. Encourage oral fluid intake, repeat BMP one week.  

## 2020-04-22 NOTE — Progress Notes (Signed)
This encounter was created in error - please disregard.

## 2020-04-22 NOTE — Assessment & Plan Note (Signed)
04/05/20 Na 136, K 4.2, Bun 37, creat 1.52, eGFR 39, wbc 13.1, Hgb 9.5, neutrophils 73.1% 04/21/20 Na 138, K 4.7, Bun 30, creat 1.99, eGFR 28, wbc 6.4, Hgb 9.7, plt 269, neutrophils 57.4%, TSH 1. Encourage oral fluid intake, repeat BMP one week.  

## 2020-04-22 NOTE — Assessment & Plan Note (Addendum)
Per ST eval. The patient's wife: the patient took Ranitidine 75mg  bid at home, desires to resume it. Will try Omeprazole 20mg  qd po.

## 2020-04-26 ENCOUNTER — Ambulatory Visit (INDEPENDENT_AMBULATORY_CARE_PROVIDER_SITE_OTHER): Payer: Medicare Other | Admitting: Family

## 2020-04-26 ENCOUNTER — Encounter: Payer: Self-pay | Admitting: Family

## 2020-04-26 ENCOUNTER — Ambulatory Visit (INDEPENDENT_AMBULATORY_CARE_PROVIDER_SITE_OTHER): Payer: Medicare Other

## 2020-04-26 DIAGNOSIS — M25551 Pain in right hip: Secondary | ICD-10-CM

## 2020-04-27 DIAGNOSIS — Z23 Encounter for immunization: Secondary | ICD-10-CM | POA: Diagnosis not present

## 2020-04-27 NOTE — Progress Notes (Signed)
Office Visit Note   Patient: [name] [name]           Date of Birth: 1926/03/26           MRN: [id number] Visit Date: 04/26/2020              Requested by: [name], MD [address],  [place] [address] PCP: [name], MD  No chief complaint on file.     HPI: Patient is status post ORIF right IM nail inter trochanteric hip fracture.  He is at [facility] working with PT, hopes to be d/c home in 2 weeks. Is accompanied today by his wife.  Do much better ambulating with walker and +1 assist.   Is in wheel chair today.   Care complicated by alzheimer's dementia.  Assessment & Plan: Visit Diagnoses:  1. Pain in right hip     Plan: Will continue with PT, WBAT on RLE. Continue to work on mobilization.  Per wife prior to this fracture he was ambulatory with a quad cane.    Follow-Up Instructions: No follow-ups on file.   Ortho Exam  Patient is alert, oriented, no adenopathy, well-dressed, normal affect, normal respiratory effort. No drainage no erythema surgical wound is well healed. Minimal tenderness to greater trochanter. Painless rom of hip. Imaging: No results found. No images are attached to the encounter.  Labs: Lab Results  Component Value Date   HGBA1C  12/13/2008    5.5 (NOTE) The ADA recommends the following therapeutic goal for glycemic control related to Hgb A1c measurement: Goal of therapy: <6.5 Hgb A1c  Reference: American Diabetes Association: Clinical Practice Recommendations 2010, Diabetes Care, 2010, 33: (Suppl  1).   REPTSTATUS 06/10/2018 FINAL 06/05/2018   CULT  06/05/2018    NO GROWTH 5 DAYS Performed at [facility], [place] [address] [place], [place] [address]      Lab Results  Component Value Date   ALBUMIN 3.2 (A) 04/05/2020   ALBUMIN 3.1 (L) 12/25/2018   ALBUMIN 2.3 (L) 06/08/2018    Lab Results  Component Value Date   MG 2.2 12/16/2008   MG 2.5 12/15/2008   No results found for:  VD25OH  No results found for: PREALBUMIN CBC EXTENDED Latest Ref Rng & Units 04/14/2020 04/07/2020 04/05/2020  WBC - 9.0 6.3 13.1  RBC 3.87 - 5.11 3.28(A) 2.75(A) 3.07(A)  HGB 13.5 - 17.5 - 8.5(A) 9.5(A)  HCT 41 - 53 31(A) 26(A) 29(A)  PLT 150 - 399 409(A) 470(A) 562(A)  NEUTROABS - 4,167.00 4,114.00 9,576.00  LYMPHSABS 0.7 - 4.0 K/uL - - -     There is no height or weight on file to calculate BMI.  Orders:  Orders Placed This Encounter  Procedures  . XR HIP UNILAT W OR W/O PELVIS 2-3 VIEWS RIGHT   No orders of the defined types were placed in this encounter.    Procedures: No procedures performed  Clinical Data: No additional findings.  ROS:  All other systems negative, except as noted in the HPI. Review of Systems  Constitutional: Negative for chills and fever.  Musculoskeletal: Positive for arthralgias and myalgias.  Skin: Negative for wound.    Objective: Vital Signs: There were no vitals taken for this visit.  Specialty Comments:  No specialty comments available.  PMFS History: Patient Active Problem List   Diagnosis Date Noted  . GERD (gastroesophageal reflux disease) 04/22/2020  . Weight loss 04/20/2020  . Slow transit constipation 04/20/2020  .  CKD (chronic kidney disease) stage 3, GFR 30-59 ml/min (HCC) 04/06/2020  . Right hip pain 04/04/2020  . Depression, recurrent ([place]) 04/04/2020  . Malaise 04/04/2020  . Acquired thrombophilia ([place]) 03/23/2020  . Hip fracture ([place]) 03/21/2020  . DNR (do not resuscitate) 03/21/2020  . Urinary urgency 01/01/2019  . Candidiasis 01/01/2019  . Closed displaced intertrochanteric fracture of left femur ([place]) 12/25/2018  . Closed intertrochanteric fracture of hip, left, initial encounter ([place]) 12/25/2018  . Pain due to onychomycosis of toenails of both feet 12/12/2018  . Acute blood loss anemia 06/08/2018  . GI bleed 06/05/2018  . HTN (hypertension)   . High cholesterol   . Diverticulosis   . Lower GI bleed   .  AAA (abdominal aortic aneurysm) without rupture ([place]) 01/09/2018  . Ascending aortic aneurysm ([place]) 01/09/2018  . Heart block AV second degree 07/15/2017  . Status post three vessel coronary artery bypass 07/15/2017  . Left ventricular systolic dysfunction without heart failure 05/11/2017  . Hypercholesterolemia 05/11/2017  . CHB (complete heart block) ([place]) 06/07/2015  . Alzheimer's disease ([place]) 01/26/2015  . Pacemaker 01/26/2013  . Permanent atrial fibrillation ([place]) 01/26/2013  . Second degree AV block, Mobitz type II 01/26/2013  . Coronary artery disease involving native coronary artery of native heart without angina pectoris 01/26/2013  . Dyspnea 01/26/2013   Past Medical History:  Diagnosis Date  . Atrial fibrillation, permanent ([place])    no longer on AC due to GI bleed  . CAD (coronary artery disease)   . Dementia ([place])   . Diverticulosis    diverticular bleed 2011  . High cholesterol   . HTN (hypertension)    has improved over time, no rx as of 2019  . Lower GI bleed     Family History  Problem Relation Age of Onset  . Stroke Father   . Cervical cancer Other        siblings  . COPD Sister   . Dementia Brother     Past Surgical History:  Procedure Laterality Date  . angiolplasty    . APPENDECTOMY    . INSERT / REPLACE / REMOVE PACEMAKER    . INTRAMEDULLARY (IM) NAIL INTERTROCHANTERIC Left 12/26/2018   Procedure: INTRAMEDULLARY (IM) NAIL INTERTROCHANTRIC;  Surgeon: Leandrew Koyanagi, MD;  Location: [facility];  Service: Orthopedics;  Laterality: Left;  . INTRAMEDULLARY (IM) NAIL INTERTROCHANTERIC Right 03/22/2020   Procedure: INTRAMEDULLARY (IM) NAIL INTERTROCHANTRIC;  Surgeon: Newt Minion, MD;  Location: [facility];  Service: Orthopedics;  Laterality: Right;  . KNEE SURGERY    . PILONIDAL CYST EXCISION    . rotator cuff surgery  1985  . TONSILLECTOMY    . triple heart bypass     Social History   Occupational History  . Occupation: retired  Tobacco Use  . Smoking  status: Former Smoker    Quit date: 07/02/1974    Years since quitting: 45.8  . Smokeless tobacco: Never Used  Vaping Use  . Vaping Use: Never used  Substance and Sexual Activity  . Alcohol use: Not Currently    Comment: last used in dec 2019  . Drug use: No  . Sexual activity: Not on file

## 2020-04-28 LAB — BASIC METABOLIC PANEL
BUN: 32 — AB (ref 4–21)
CO2: 27 — AB (ref 13–22)
Chloride: 101 (ref 99–108)
Creatinine: 2 — AB (ref 0.6–1.3)
Glucose: 78
Potassium: 5.2 (ref 3.4–5.3)
Sodium: 137 (ref 137–147)

## 2020-04-28 LAB — COMPREHENSIVE METABOLIC PANEL: Calcium: 9.2 (ref 8.7–10.7)

## 2020-04-29 ENCOUNTER — Non-Acute Institutional Stay (SKILLED_NURSING_FACILITY): Payer: Medicare Other | Admitting: Nurse Practitioner

## 2020-04-29 ENCOUNTER — Encounter: Payer: Self-pay | Admitting: Nurse Practitioner

## 2020-04-29 DIAGNOSIS — G309 Alzheimer's disease, unspecified: Secondary | ICD-10-CM | POA: Diagnosis not present

## 2020-04-29 DIAGNOSIS — K219 Gastro-esophageal reflux disease without esophagitis: Secondary | ICD-10-CM | POA: Diagnosis not present

## 2020-04-29 DIAGNOSIS — D62 Acute posthemorrhagic anemia: Secondary | ICD-10-CM | POA: Diagnosis not present

## 2020-04-29 DIAGNOSIS — F0280 Dementia in other diseases classified elsewhere without behavioral disturbance: Secondary | ICD-10-CM | POA: Diagnosis not present

## 2020-04-29 DIAGNOSIS — R3915 Urgency of urination: Secondary | ICD-10-CM | POA: Diagnosis not present

## 2020-04-29 DIAGNOSIS — M25551 Pain in right hip: Secondary | ICD-10-CM | POA: Diagnosis not present

## 2020-04-29 DIAGNOSIS — K5901 Slow transit constipation: Secondary | ICD-10-CM

## 2020-04-29 DIAGNOSIS — F339 Major depressive disorder, recurrent, unspecified: Secondary | ICD-10-CM | POA: Diagnosis not present

## 2020-04-29 DIAGNOSIS — I4821 Permanent atrial fibrillation: Secondary | ICD-10-CM

## 2020-04-29 DIAGNOSIS — N1832 Chronic kidney disease, stage 3b: Secondary | ICD-10-CM

## 2020-04-29 NOTE — Assessment & Plan Note (Signed)
persisted  elevated Bun/creat 32/1.97 04/28/20, Bun/creat 30/1.99 04/21/20 from previous 37/1.5 04/05/20. His appetite reported poor, regardless Mirtazapine use. Will hold Donepezil x 2weeks,  encourage oral fluid intakes, f/u BMP in one week.

## 2020-04-29 NOTE — Assessment & Plan Note (Signed)
Afib, no anticoagulation 2nd to GI bleed/fall

## 2020-04-29 NOTE — Assessment & Plan Note (Signed)
Depression, takes Sertraline, Mirtazapine.

## 2020-04-29 NOTE — Assessment & Plan Note (Signed)
S/p R hip fx IM nailing, healed,  WBAT, saw Ortho 04/26/20. Prn Norco, Tylenol.

## 2020-04-29 NOTE — Assessment & Plan Note (Signed)
Constipation, takes Colace bid, prn MiraLax, Bisacodyl 5mg  prn.

## 2020-04-29 NOTE — Assessment & Plan Note (Signed)
Dementia, takes Donepezil, daily ASA 81mg , Atorvastatin

## 2020-04-29 NOTE — Progress Notes (Signed)
Location:   [facility] Room Number: 17 Place of Service:  SNF (31) Provider: Lennie Odor Spencer Cardinal NP  [name], MD  Patient Care Team: [name], MD as PCP - General (Internal Medicine) [name], MD as PCP - Cardiology (Cardiology) Virgie Dad, MD as Consulting Physician (Internal Medicine)  Extended Emergency Contact Information Primary Emergency Contact: Marnette Burgess Address: [address]          [place] [address] Johnnette Litter of [place] Phone: [phone] Mobile Phone: [phone] Relation: Spouse Secondary Emergency Contact: [place], [place] Mobile Phone: [phone] Relation: Son  Code Status: DNR Goals of care: Advanced Directive information Advanced Directives 04/20/2020  Does Patient Have a Medical Advance Directive? Yes  Type of Advance Directive Out of facility DNR (pink MOST or yellow form)  Does patient want to make changes to medical advance directive? No - Patient declined  Copy of [place] in Chart? -  Would patient like information on creating a medical advance directive? -  Pre-existing out of facility DNR order (yellow form or pink MOST form) Yellow form placed in chart (order not valid for inpatient use);Pink MOST form placed in chart (order not valid for inpatient use)     Chief Complaint  Patient presents with  . Acute Visit    Elevated Bun/creat    HPI:  Pt is a 86 y.o. male seen today for an acute visit for persisted  elevated Bun/creat 32/1.97 04/28/20, Bun/creat 30/1.99 04/21/20 from previous 37/1.5 04/05/20. His appetite reported poor, regardless Mirtazapine use.   Urinary frequency, takes Finasteride 5mg  qd.  S/p R hip fx IM nailing, healed,  WBAT, saw Ortho 04/26/20. Prn Norco, Tylenol.  Afib, no anticoagulation 2nd to GI bleed/fall Dementia, takes Donepezil, daily ASA 81mg , Atorvastatin IDA on Fe, Hgb 9.7  04/21/20 Depression, takes Sertraline, Mirtazapine.              Constipation, takes Colace bid, prn MiraLax, Bisacodyl 5mg  prn.   GERD, takes Omeprazole 20mg  qd.    Past Medical History:  Diagnosis Date  . Atrial fibrillation, permanent ([place])    no longer on AC due to GI bleed  . CAD (coronary artery disease)   . Dementia ([place])   . Diverticulosis    diverticular bleed 2011  . High cholesterol   . HTN (hypertension)    has improved over time, no rx as of 2019  . Lower GI bleed    Past Surgical History:  Procedure Laterality Date  . angiolplasty    . APPENDECTOMY    . INSERT / REPLACE / REMOVE PACEMAKER    . INTRAMEDULLARY (IM) NAIL INTERTROCHANTERIC Left 12/26/2018   Procedure: INTRAMEDULLARY (IM) NAIL INTERTROCHANTRIC;  Surgeon: Leandrew Koyanagi, MD;  Location: [facility];  Service: Orthopedics;  Laterality: Left;  . INTRAMEDULLARY (IM) NAIL INTERTROCHANTERIC Right 03/22/2020   Procedure: INTRAMEDULLARY (IM) NAIL INTERTROCHANTRIC;  Surgeon: Newt Minion, MD;  Location: [facility];  Service: Orthopedics;  Laterality: Right;  . KNEE SURGERY    . PILONIDAL CYST EXCISION    . rotator cuff surgery  1985  . TONSILLECTOMY    . triple heart bypass      Allergies  Allergen Reactions  . Amiodarone Other (See Comments)    Had a severe lung infection in 2003  . Antihistamines, Diphenhydramine-Type Other (See Comments)    Jittery    Allergies as of 04/29/2020      Reactions   Amiodarone Other (See Comments)   Had a severe  lung infection in 2003   Antihistamines, Diphenhydramine-type Other (See Comments)   Jittery      Medication List       Accurate as of April 29, 2020 11:59 PM. If you have any questions, ask your nurse or doctor.        acetaminophen 500 MG tablet Commonly known as: TYLENOL Take 1 tablet (500 mg total) by mouth every 6 (six) hours as needed for mild pain.   aspirin EC 81 MG tablet Take 81 mg by mouth daily. Swallow whole.   atorvastatin 40 MG  tablet Commonly known as: LIPITOR Take 40 mg by mouth every evening.   bisacodyl 5 MG EC tablet Commonly known as: DULCOLAX Take 1 tablet (5 mg total) by mouth daily as needed for moderate constipation.   docusate sodium 100 MG capsule Commonly known as: COLACE Take 1 capsule (100 mg total) by mouth 2 (two) times daily.   donepezil 5 MG tablet Commonly known as: ARICEPT Take 1 tablet (5 mg total) by mouth 2 (two) times daily.   ferrous sulfate 325 (65 FE) MG EC tablet Take 325 mg by mouth daily.   finasteride 5 MG tablet Commonly known as: PROSCAR Take 5 mg by mouth every evening.   HYDROcodone-acetaminophen 5-325 MG tablet Commonly known as: NORCO/VICODIN Take 1-2 tablets by mouth every 4 (four) hours as needed for moderate pain (pain score 4-6). What changed: how much to take   mirtazapine 15 MG tablet Commonly known as: REMERON Take 7.5 mg by mouth daily.   multivitamin with minerals Tabs tablet Take 1 tablet by mouth daily.   omeprazole 20 MG capsule Commonly known as: PRILOSEC Take 20 mg by mouth daily.   polyethylene glycol 17 g packet Commonly known as: MIRALAX / GLYCOLAX Take 17 g by mouth daily as needed for mild constipation.   sertraline 25 MG tablet Commonly known as: ZOLOFT Take 25 mg by mouth daily.   Vitamin D 50 MCG (2000 UT) Caps Take 2,000 Units by mouth at bedtime.       Review of Systems  Constitutional: Positive for appetite change and fatigue. Negative for fever.       Weight loss about #15Ibs in 2 weeks.   HENT: Positive for hearing loss. Negative for congestion and voice change.   Respiratory: Negative for cough.   Cardiovascular: Negative for leg swelling.  Gastrointestinal: Negative for abdominal pain, constipation, nausea and vomiting.  Genitourinary: Positive for frequency. Negative for dysuria, hematuria and urgency.  Musculoskeletal: Positive for arthralgias and gait problem.       R hip pain  Skin: Negative for color  change.  Neurological: Negative for speech difficulty, weakness, light-headedness and headaches.       Dementia  Psychiatric/Behavioral: Negative for behavioral problems and sleep disturbance. The patient is not nervous/anxious.        Flat affect, low voice    Immunization History  Administered Date(s) Administered  . Influenza, High Dose Seasonal PF 03/27/2019  . Influenza-Unspecified 03/27/2016, 03/19/2018, 04/12/2020  . PFIZER SARS-COV-2 Vaccination 07/17/2019, 08/07/2019  . Pneumococcal Conjugate-13 02/24/2014  . Pneumococcal Polysaccharide-23 04/25/2018  . Td 05/27/2012, 10/27/2016   Pertinent  Health Maintenance Due  Topic Date Due  . INFLUENZA VACCINE  Completed  . PNA vac Low Risk Adult  Completed   Fall Risk  07/16/2018  Falls in the past year? 0   Functional Status Survey:    Vitals:   04/29/20 1441  BP: 120/60  Pulse: 67  Resp: (!) 26  Temp: 98.1 F (36.7 C)  SpO2: 96%  Weight: 141 lb 12.8 oz (64.3 kg)  Height: 5\' 10"  (1.778 m)   Body mass index is 20.35 kg/m. Physical Exam Constitutional:      Comments: Appears tired.   HENT:     Head: Normocephalic and atraumatic.     Mouth/Throat:     Mouth: Mucous membranes are moist.  Eyes:     Extraocular Movements: Extraocular movements intact.     Conjunctiva/sclera: Conjunctivae normal.     Pupils: Pupils are equal, round, and reactive to light.  Cardiovascular:     Rate and Rhythm: Normal rate and regular rhythm.     Heart sounds: No murmur heard.   Pulmonary:     Effort: Pulmonary effort is normal.     Breath sounds: No rales.  Abdominal:     General: Bowel sounds are normal.     Palpations: Abdomen is soft.     Tenderness: There is no abdominal tenderness.  Musculoskeletal:     Cervical back: Normal range of motion and neck supple.     Right lower leg: No edema.     Left lower leg: No edema.  Skin:    General: Skin is warm and dry.  Neurological:     General: No focal deficit present.      Mental Status: He is alert. Mental status is at baseline.     Gait: Gait abnormal.     Comments: Oriented to person and place.   Psychiatric:        Mood and Affect: Mood normal.        Behavior: Behavior normal.        Thought Content: Thought content normal.     Labs reviewed: Recent Labs    03/22/20 0820 03/22/20 0820 03/23/20 0958 03/23/20 0958 03/24/20 0456 03/24/20 0456 04/05/20 0000 04/22/20 0000 04/28/20 0000  NA 139   < > 136   < > 135  --  136* 138 137  K 4.8   < > 3.9   < > 4.2   < > 4.2 4.7 5.2  CL 106   < > 103   < > 105   < > 101 102 101  CO2 23   < > 25   < > 23   < > 22 26* 27*  GLUCOSE 110*  --  137*  --  93  --   --   --   --   BUN 30*   < > 32*   < > 44*  --  37* 30* 32*  CREATININE 1.56*   < > 1.72*   < > 1.71*  --  1.5* 2.0* 2.0*  CALCIUM 9.1   < > 8.6*   < > 8.6*   < > 9.2 9.1 9.2   < > = values in this interval not displayed.   Recent Labs    04/05/20 0000 04/22/20 0000  AST 27 16  ALT 12 6*  ALKPHOS 139* 112  ALBUMIN 3.2* 2.8*   Recent Labs    03/22/20 0820 03/22/20 0820 03/23/20 0958 03/23/20 0958 03/24/20 0456 03/24/20 0456 04/05/20 0000 04/05/20 0000 04/07/20 0000 04/14/20 0000 04/22/20 0000  WBC 9.8   < > 11.1*   < > 10.1  --  13.1   < > 6.3 9.0 9.0  6.4  NEUTROABS  --   --   --   --   --   --  9,576.00   < >  4,114.00 4,167.00 4,167.00  3,674.00  HGB 9.9*   < > 8.7*   < > 7.9*   < > 9.5*  --  8.5*  --  9.9*  9.7*  HCT 32.3*   < > 28.4*   < > 25.4*   < > 29*   < > 26* 31* 31*  31*  MCV 99.1  --  100.7*  --  99.2  --   --   --   --   --   --   PLT 195   < > 159   < > 151   < > 562*   < > 470* 409* 409*  269   < > = values in this interval not displayed.   Lab Results  Component Value Date   TSH 1.62 04/22/2020   Lab Results  Component Value Date   HGBA1C  12/13/2008    5.5 (NOTE) The ADA recommends the following therapeutic goal for glycemic control related to Hgb A1c measurement: Goal of therapy: <6.5 Hgb A1c   Reference: American Diabetes Association: Clinical Practice Recommendations 2010, Diabetes Care, 2010, 33: (Suppl  1).   No results found for: CHOL, HDL, LDLCALC, LDLDIRECT, TRIG, CHOLHDL  Significant Diagnostic Results in last 30 days:  XR HIP UNILAT W OR W/O PELVIS 2-3 VIEWS RIGHT  Result Date: 04/27/2020 Radiographs of right hip and pelvis show stable alignment of hardware. No complicating feature.   XR HIP UNILAT W OR W/O PELVIS 2-3 VIEWS RIGHT  Result Date: 04/05/2020 Radiographs of right hip show stable alignment of fixation hardware. No complicating feature.   Assessment/Plan: CKD (chronic kidney disease) stage 3, GFR 30-59 ml/min (HCC) persisted  elevated Bun/creat 32/1.97 04/28/20, Bun/creat 30/1.99 04/21/20 from previous 37/1.5 04/05/20. His appetite reported poor, regardless Mirtazapine use. Will hold Donepezil x 2weeks,  encourage oral fluid intakes, f/u BMP in one week.    Urinary urgency Urinary frequency, takes Finasteride 5mg  qd.    Right hip pain S/p R hip fx IM nailing, healed,  WBAT, saw Ortho 04/26/20. Prn Norco, Tylenol.    Permanent atrial fibrillation (HCC) Afib, no anticoagulation 2nd to GI bleed/fall   Alzheimer's disease ([place]) Dementia, takes Donepezil, daily ASA 81mg , Atorvastatin   Acute blood loss anemia IDA on Fe, Hgb 9.7 04/21/20   Depression, recurrent (HCC) Depression, takes Sertraline, Mirtazapine.   Slow transit constipation Constipation, takes Colace bid, prn MiraLax, Bisacodyl 5mg  prn.    GERD (gastroesophageal reflux disease) GERD, takes Omeprazole 20mg  qd.      Family/ staff Communication: plan of care reviewed with the patient and charge nurse.   Labs/tests ordered:  BMP one week  Time spend 35 minutes.

## 2020-04-29 NOTE — Assessment & Plan Note (Signed)
GERD, takes Omeprazole 20mg qd.   

## 2020-04-29 NOTE — Assessment & Plan Note (Signed)
Urinary frequency, takes Finasteride 5mg  qd.

## 2020-04-29 NOTE — Assessment & Plan Note (Signed)
IDA on Fe, Hgb 9.7 04/21/20

## 2020-05-02 ENCOUNTER — Encounter: Payer: Self-pay | Admitting: Nurse Practitioner

## 2020-05-11 DIAGNOSIS — L905 Scar conditions and fibrosis of skin: Secondary | ICD-10-CM | POA: Diagnosis not present

## 2020-05-11 DIAGNOSIS — L821 Other seborrheic keratosis: Secondary | ICD-10-CM | POA: Diagnosis not present

## 2020-05-11 DIAGNOSIS — L570 Actinic keratosis: Secondary | ICD-10-CM | POA: Diagnosis not present

## 2020-05-11 DIAGNOSIS — Z85828 Personal history of other malignant neoplasm of skin: Secondary | ICD-10-CM | POA: Diagnosis not present

## 2020-05-17 ENCOUNTER — Non-Acute Institutional Stay (SKILLED_NURSING_FACILITY): Payer: Medicare Other | Admitting: Internal Medicine

## 2020-05-17 ENCOUNTER — Encounter: Payer: Self-pay | Admitting: Internal Medicine

## 2020-05-17 DIAGNOSIS — G309 Alzheimer's disease, unspecified: Secondary | ICD-10-CM

## 2020-05-17 DIAGNOSIS — R634 Abnormal weight loss: Secondary | ICD-10-CM

## 2020-05-17 DIAGNOSIS — N184 Chronic kidney disease, stage 4 (severe): Secondary | ICD-10-CM | POA: Diagnosis not present

## 2020-05-17 DIAGNOSIS — F339 Major depressive disorder, recurrent, unspecified: Secondary | ICD-10-CM

## 2020-05-17 DIAGNOSIS — I4821 Permanent atrial fibrillation: Secondary | ICD-10-CM

## 2020-05-17 DIAGNOSIS — K219 Gastro-esophageal reflux disease without esophagitis: Secondary | ICD-10-CM | POA: Diagnosis not present

## 2020-05-17 DIAGNOSIS — F0280 Dementia in other diseases classified elsewhere without behavioral disturbance: Secondary | ICD-10-CM

## 2020-05-17 NOTE — Progress Notes (Signed)
Location:  [facility] Room Number: 17-A Place of Service:  SNF (31)  Provider: Veleta Miners, MD  PCP: [name], MD Patient Care Team: [name], MD as PCP - General (Internal Medicine) [name], MD as PCP - Cardiology (Cardiology) Virgie Dad, MD as Consulting Physician (Internal Medicine)  Extended Emergency Contact Information Primary Emergency Contact: Marnette Burgess Address: [address]          [place] [address] Johnnette Litter of [place] Phone: [phone] Mobile Phone: [phone] Relation: Spouse Secondary Emergency Contact: Gabryel, Files Mobile Phone: [phone] Relation: Son  Code Status: DNR Goals of care:  Advanced Directive information Advanced Directives 05/17/2020  Does Patient Have a Medical Advance Directive? Yes  Type of Advance Directive Out of facility DNR (pink MOST or yellow form)  Does patient want to make changes to medical advance directive? No - Patient declined  Copy of [place] in Chart? -  Would patient like information on creating a medical advance directive? -  Pre-existing out of facility DNR order (yellow form or pink MOST form) Yellow form placed in chart (order not valid for inpatient use);Pink MOST form placed in chart (order not valid for inpatient use)     Allergies  Allergen Reactions  . Amiodarone Other (See Comments)    Had a severe lung infection in 2003  . Antihistamines, Diphenhydramine-Type Other (See Comments)    Jittery    Chief Complaint  Patient presents with  . Discharge Note    Discharge from [facility]    HPI:  86 y.o. male seen today for discharge  Admitted in the hospital from 9/20-9/23 for Right Hip Fracture  Patient has a history of CAD s/p CABG, CHB s/p PPM, PAF not on any coagulation due to history of GI bleeds and fall, hypertension, hyperlipidemia, history of abdominal aortic aneurysm 4 cm cognitive  impairment S/P Left Hip Fracture in 6/21  He had Mechanical fall in his Home where he lives with his Wife Was found to have right hip Fracture Underwent I/M Nailing  on 9/21  Was admitted in SNF for therapy Cannot give much history due to his dementia..  Per nurses patient is doing well.  Able to walk with mild assist.. Pain is controlled with Tylenol    Past Medical History:  Diagnosis Date  . Atrial fibrillation, permanent ([place])    no longer on AC due to GI bleed  . CAD (coronary artery disease)   . Dementia ([place])   . Diverticulosis    diverticular bleed 2011  . High cholesterol   . HTN (hypertension)    has improved over time, no rx as of 2019  . Lower GI bleed     Past Surgical History:  Procedure Laterality Date  . angiolplasty    . APPENDECTOMY    . INSERT / REPLACE / REMOVE PACEMAKER    . INTRAMEDULLARY (IM) NAIL INTERTROCHANTERIC Left 12/26/2018   Procedure: INTRAMEDULLARY (IM) NAIL INTERTROCHANTRIC;  Surgeon: Leandrew Koyanagi, MD;  Location: [facility];  Service: Orthopedics;  Laterality: Left;  . INTRAMEDULLARY (IM) NAIL INTERTROCHANTERIC Right 03/22/2020   Procedure: INTRAMEDULLARY (IM) NAIL INTERTROCHANTRIC;  Surgeon: Newt Minion, MD;  Location: [facility];  Service: Orthopedics;  Laterality: Right;  . KNEE SURGERY    . PILONIDAL CYST EXCISION    . rotator cuff surgery  1985  . TONSILLECTOMY    . triple heart bypass        reports that  he quit smoking about 45 years ago. He has never used smokeless tobacco. He reports previous alcohol use. He reports that he does not use drugs. Social History   Socioeconomic History  . Marital status: Married    Spouse name: [name]  . Number of children: 6  . Years of education: college  . Highest education level: Not on file  Occupational History  . Occupation: retired  Tobacco Use  . Smoking status: Former Smoker    Quit date: 07/02/1974    Years since quitting: 45.9  . Smokeless tobacco: Never Used  Vaping Use  . Vaping  Use: Never used  Substance and Sexual Activity  . Alcohol use: Not Currently    Comment: last used in dec 2019  . Drug use: No  . Sexual activity: Not on file  Other Topics Concern  . Not on file  Social History Narrative   Patient is married [name]) and lives at home with his wife.   Patient has a college education.   Patient is a retired Materials engineer.   Patient does not drink any caffeine.   Patient has six children.   Social Determinants of Health   Financial Resource Strain:   . Difficulty of Paying Living Expenses: Not on file  Food Insecurity:   . Worried About Charity fundraiser in the Last Year: Not on file  . Ran Out of Food in the Last Year: Not on file  Transportation Needs:   . Lack of Transportation (Medical): Not on file  . Lack of Transportation (Non-Medical): Not on file  Physical Activity:   . Days of Exercise per Week: Not on file  . Minutes of Exercise per Session: Not on file  Stress:   . Feeling of Stress : Not on file  Social Connections:   . Frequency of Communication with Friends and Family: Not on file  . Frequency of Social Gatherings with Friends and Family: Not on file  . Attends Religious Services: Not on file  . Active Member of Clubs or Organizations: Not on file  . Attends Archivist Meetings: Not on file  . Marital Status: Not on file  Intimate Partner Violence:   . Fear of Current or Ex-Partner: Not on file  . Emotionally Abused: Not on file  . Physically Abused: Not on file  . Sexually Abused: Not on file   Functional Status Survey:    Allergies  Allergen Reactions  . Amiodarone Other (See Comments)    Had a severe lung infection in 2003  . Antihistamines, Diphenhydramine-Type Other (See Comments)    Jittery    Pertinent  Health Maintenance Due  Topic Date Due  . INFLUENZA VACCINE  Completed  . PNA vac Low Risk Adult  Completed    Medications: Outpatient Encounter Medications as of 05/17/2020    Medication Sig  . acetaminophen (TYLENOL) 500 MG tablet Take 1 tablet (500 mg total) by mouth every 6 (six) hours as needed for mild pain.  [name] aspirin EC 81 MG tablet Take 81 mg by mouth daily. Swallow whole.  [name] atorvastatin (LIPITOR) 40 MG tablet Take 40 mg by mouth every evening.   . bisacodyl (DULCOLAX) 5 MG EC tablet Take 1 tablet (5 mg total) by mouth daily as needed for moderate constipation.  . Cholecalciferol (VITAMIN D) 2000 UNITS CAPS Take 2,000 Units by mouth at bedtime.   . docusate sodium (COLACE) 100 MG capsule Take 1 capsule (100 mg total) by mouth 2 (  two) times daily.  [name] donepezil (ARICEPT) 5 MG tablet Take 1 tablet (5 mg total) by mouth 2 (two) times daily.  . ferrous sulfate 325 (65 FE) MG EC tablet Take 325 mg by mouth daily.   . finasteride (PROSCAR) 5 MG tablet Take 5 mg by mouth every evening.   [name] HYDROcodone-acetaminophen (NORCO/VICODIN) 5-325 MG tablet Take 1-2 tablets by mouth every 4 (four) hours as needed for moderate pain (pain score 4-6).  [name] lactose free nutrition (BOOST) LIQD Take 237 mLs by mouth daily. Add chocolate ice cream to make a milkshake  . mirtazapine (REMERON) 15 MG tablet Take 7.5 mg by mouth daily.  . Multiple Vitamin (MULTIVITAMIN WITH MINERALS) TABS tablet Take 1 tablet by mouth daily.   [name] omeprazole (PRILOSEC) 20 MG capsule Take 20 mg by mouth daily.  . polyethylene glycol (MIRALAX / GLYCOLAX) 17 g packet Take 17 g by mouth daily as needed for mild constipation.  . sertraline (ZOLOFT) 25 MG tablet Take 25 mg by mouth daily.   No facility-administered encounter medications on file as of 05/17/2020.    Review of Systems  Unable to perform ROS: Dementia    Vitals:   05/17/20 1556  BP: 120/80  Pulse: 60  Resp: 18  Temp: 97.8 F (36.6 C)  TempSrc: Oral  SpO2: 93%  Weight: 142 lb 11.2 oz (64.7 kg)  Height: 5\' 10"  (1.778 m)   Body mass index is 20.48 kg/m. Physical Exam Vitals reviewed.  Constitutional:      Appearance: Normal  appearance.  HENT:     Head: Normocephalic.     Nose: Nose normal.     Mouth/Throat:     Pharynx: Oropharynx is clear.  Eyes:     Pupils: Pupils are equal, round, and reactive to light.  Cardiovascular:     Rate and Rhythm: Normal rate and regular rhythm.     Pulses: Normal pulses.  Pulmonary:     Effort: Pulmonary effort is normal.     Breath sounds: Normal breath sounds.  Abdominal:     General: Abdomen is flat. Bowel sounds are normal.     Palpations: Abdomen is soft.  Musculoskeletal:        General: Swelling present.     Cervical back: Neck supple.  Skin:    General: Skin is warm and dry.  Neurological:     General: No focal deficit present.     Mental Status: He is alert.     Comments: Responds Appropriately Oriented to himself  Psychiatric:        Mood and Affect: Mood normal.        Thought Content: Thought content normal.     Labs reviewed: Basic Metabolic Panel: Recent Labs    03/22/20 0820 03/22/20 0820 03/23/20 4696 03/23/20 0958 03/24/20 0456 03/24/20 0456 04/05/20 0000 04/22/20 0000 04/28/20 0000  NA 139   < > 136   < > 135  --  136* 138 137  K 4.8   < > 3.9   < > 4.2   < > 4.2 4.7 5.2  CL 106   < > 103   < > 105   < > 101 102 101  CO2 23   < > 25   < > 23   < > 22 26* 27*  GLUCOSE 110*  --  137*  --  93  --   --   --   --   BUN 30*   < > 32*   < >  44*  --  37* 30* 32*  CREATININE 1.56*   < > 1.72*   < > 1.71*  --  1.5* 2.0* 2.0*  CALCIUM 9.1   < > 8.6*   < > 8.6*   < > 9.2 9.1 9.2   < > = values in this interval not displayed.   Liver Function Tests: Recent Labs    04/05/20 0000 04/22/20 0000  AST 27 16  ALT 12 6*  ALKPHOS 139* 112  ALBUMIN 3.2* 2.8*   No results for input(s): LIPASE, AMYLASE in the last 8760 hours. No results for input(s): AMMONIA in the last 8760 hours. CBC: Recent Labs    03/22/20 0820 03/22/20 0820 03/23/20 0958 03/23/20 0958 03/24/20 0456 03/24/20 0456 04/05/20 0000 04/05/20 0000 04/07/20 0000  04/14/20 0000 04/22/20 0000  WBC 9.8   < > 11.1*   < > 10.1  --  13.1   < > 6.3 9.0 9.0  6.4  NEUTROABS  --   --   --   --   --   --  9,576.00   < > 4,114.00 4,167.00 4,167.00  3,674.00  HGB 9.9*   < > 8.7*   < > 7.9*   < > 9.5*  --  8.5*  --  9.9*  9.7*  HCT 32.3*   < > 28.4*   < > 25.4*   < > 29*   < > 26* 31* 31*  31*  MCV 99.1  --  100.7*  --  99.2  --   --   --   --   --   --   PLT 195   < > 159   < > 151   < > 562*   < > 470* 409* 409*  269   < > = values in this interval not displayed.   Cardiac Enzymes: No results for input(s): CKTOTAL, CKMB, CKMBINDEX, TROPONINI in the last 8760 hours. BNP: Invalid input(s): POCBNP CBG: Recent Labs    03/23/20 0615 03/23/20 1229 03/23/20 1614  GLUCAP 139* 324* 131*    Procedures and Imaging Studies During Stay: XR HIP UNILAT W OR W/O PELVIS 2-3 VIEWS RIGHT  Result Date: 04/27/2020 Radiographs of right hip and pelvis show stable alignment of hardware. No complicating feature.    Assessment/Plan:   S/P right hip fracture I/M NAiling Home therapy Discharge with help of Wife and son Pain control with Tylenol Follow up with Ortho  Permanent atrial fibrillation (HCC) No Anticoagulation due to Falls and GI bleed  Late onset Alzheimer's disease with behavioral disturbance ([place])  Continue Supportive care Has wife and son help at home On Aricept  iron deficiency anemia and CKD On iron Repeat CBC Stage 4 chronic kidney disease Will Repeat BMP Worsening CKD ? Renal Follow up as outpatient  Hyperlipidemia On Lipitor Follow up qith PCP Depression Continue on Zoloft Weight loss Has lost 10 lbs in SNF On Remeron Will Need close follow up at home Repeat CMP and CBC   Patient is being discharged with the following home health services:  Home health and Therapy      Patient has been advised to f/u with their PCP in 1-2 weeks to for a transitions of care visit.  Social services at their facility was responsible for  arranging this appointment.  Pt was provided with adequate prescriptions of noncontrolled medications to reach the scheduled appointment .  For controlled substances, a limited supply was provided as appropriate for the individual patient.  If the pt normally receives these medications from a pain clinic or has a contract with another physician, these medications should be received from that clinic or physician only).    Future labs/tests needed:

## 2020-05-18 ENCOUNTER — Non-Acute Institutional Stay (SKILLED_NURSING_FACILITY): Payer: Medicare Other | Admitting: Nurse Practitioner

## 2020-05-18 ENCOUNTER — Encounter: Payer: Self-pay | Admitting: Nurse Practitioner

## 2020-05-18 DIAGNOSIS — D62 Acute posthemorrhagic anemia: Secondary | ICD-10-CM

## 2020-05-18 DIAGNOSIS — R1310 Dysphagia, unspecified: Secondary | ICD-10-CM

## 2020-05-18 DIAGNOSIS — R059 Cough, unspecified: Secondary | ICD-10-CM

## 2020-05-18 LAB — COMPREHENSIVE METABOLIC PANEL
Albumin: 2.6 — AB (ref 3.5–5.0)
Calcium: 8.4 — AB (ref 8.7–10.7)
GFR calc Af Amer: 54
GFR calc non Af Amer: 47
Globulin: 3.2

## 2020-05-18 LAB — HEPATIC FUNCTION PANEL
ALT: 8 — AB (ref 10–40)
AST: 22 (ref 14–40)
Alkaline Phosphatase: 92 (ref 25–125)
Bilirubin, Total: 0.3

## 2020-05-18 LAB — BASIC METABOLIC PANEL
BUN: 26 — AB (ref 4–21)
CO2: 27 — AB (ref 13–22)
Chloride: 106 (ref 99–108)
Creatinine: 1.3 (ref 0.6–1.3)
Glucose: 83
Potassium: 4.6 (ref 3.4–5.3)
Sodium: 140 (ref 137–147)

## 2020-05-18 LAB — CBC AND DIFFERENTIAL
HCT: 27 — AB (ref 41–53)
Hemoglobin: 8.6 — AB (ref 13.5–17.5)
Platelets: 291 (ref 150–399)
WBC: 5.4

## 2020-05-18 LAB — CBC: RBC: 2.87 — AB (ref 3.87–5.11)

## 2020-05-18 NOTE — Progress Notes (Signed)
Location:   SNF [facility] Room Number: 17-A Place of Service:  SNF (31) Provider: Lennie Odor Gerad Cornelio NP  [name], MD  Patient Care Team: [name], MD as PCP - General (Internal Medicine) [name], MD as PCP - Cardiology (Cardiology) Virgie Dad, MD as Consulting Physician (Internal Medicine)  Extended Emergency Contact Information Primary Emergency Contact: Marnette Burgess Address: [address]          [place] [address] Johnnette Litter of [place] Phone: [phone] Mobile Phone: [phone] Relation: Spouse Secondary Emergency Contact: Demauri, [place] Mobile Phone: [phone] Relation: Son  Code Status: DNR Goals of care: Advanced Directive information Advanced Directives 05/18/2020  Does Patient Have a Medical Advance Directive? Yes  Type of Advance Directive Out of facility DNR (pink MOST or yellow form)  Does patient want to make changes to medical advance directive? No - Patient declined  Copy of [place] in Chart? -  Would patient like information on creating a medical advance directive? -  Pre-existing out of facility DNR order (yellow form or pink MOST form) Yellow form placed in chart (order not valid for inpatient use)     Chief Complaint  Patient presents with  . Acute Visit    Patient is seen for wheezing and congestive cough    HPI:  Pt is a 86 y.o. male seen today for an acute visit for congestive cough, wheezes, no choking episodes, but the patient is risk for aspiration per speech therapy eval due to his posture/kyphosis. The patient doesn't always remember to eat/drink in upright position. He is afebrile, no O2 desaturation. The patient denied chest pain/pressure, palpitation, SOB, abd pain, nausea, vomiting, or sputum production.    S/p R hip fx IM nailing, pending discharge ome, takes Tylenol, f/u Ortho  AFib, heart rate is in control, not anticoagulated due to Hx of fall, GI bleed  Dementia,  takes Donepezil  IDA takes Iron, Hgb 8.6 05/18/20  CKD, Bun/creat 26/1.32 05/18/20, improved.   Depression, better, takes Sertraline, Mirtazapine.   GERD, takes Omeprazole.   Urinary frequency, uses urinal, takes Finasteride   Past Medical History:  Diagnosis Date  . Atrial fibrillation, permanent ([place])    no longer on AC due to GI bleed  . CAD (coronary artery disease)   . Dementia ([place])   . Diverticulosis    diverticular bleed 2011  . High cholesterol   . HTN (hypertension)    has improved over time, no rx as of 2019  . Lower GI bleed    Past Surgical History:  Procedure Laterality Date  . angiolplasty    . APPENDECTOMY    . INSERT / REPLACE / REMOVE PACEMAKER    . INTRAMEDULLARY (IM) NAIL INTERTROCHANTERIC Left 12/26/2018   Procedure: INTRAMEDULLARY (IM) NAIL INTERTROCHANTRIC;  Surgeon: Leandrew Koyanagi, MD;  Location: [facility];  Service: Orthopedics;  Laterality: Left;  . INTRAMEDULLARY (IM) NAIL INTERTROCHANTERIC Right 03/22/2020   Procedure: INTRAMEDULLARY (IM) NAIL INTERTROCHANTRIC;  Surgeon: Newt Minion, MD;  Location: [facility];  Service: Orthopedics;  Laterality: Right;  . KNEE SURGERY    . PILONIDAL CYST EXCISION    . rotator cuff surgery  1985  . TONSILLECTOMY    . triple heart bypass      Allergies  Allergen Reactions  . Amiodarone Other (See Comments)    Had a severe lung infection in 2003  . Antihistamines, Diphenhydramine-Type Other (See Comments)    Jittery    Allergies as of 05/18/2020  Reactions   Amiodarone Other (See Comments)   Had a severe lung infection in 2003   Antihistamines, Diphenhydramine-type Other (See Comments)   Jittery      Medication List       Accurate as of May 18, 2020 11:59 PM. If you have any questions, ask your nurse or doctor.        acetaminophen 500 MG tablet Commonly known as: TYLENOL Take 1 tablet (500 mg total) by mouth every 6 (six) hours as needed for mild pain.   aspirin EC 81 MG tablet Take 81 mg by  mouth daily. Swallow whole.   atorvastatin 40 MG tablet Commonly known as: LIPITOR Take 40 mg by mouth every evening.   bisacodyl 5 MG EC tablet Commonly known as: DULCOLAX Take 1 tablet (5 mg total) by mouth daily as needed for moderate constipation.   docusate sodium 100 MG capsule Commonly known as: COLACE Take 1 capsule (100 mg total) by mouth 2 (two) times daily.   donepezil 5 MG tablet Commonly known as: ARICEPT Take 1 tablet (5 mg total) by mouth 2 (two) times daily.   ferrous sulfate 325 (65 FE) MG EC tablet Take 325 mg by mouth daily.   finasteride 5 MG tablet Commonly known as: PROSCAR Take 5 mg by mouth every evening.   lactose free nutrition Liqd Take 237 mLs by mouth daily. Add chocolate ice cream to make a milkshake   mirtazapine 15 MG tablet Commonly known as: REMERON Take 7.5 mg by mouth daily.   multivitamin with minerals Tabs tablet Take 1 tablet by mouth daily.   omeprazole 20 MG capsule Commonly known as: PRILOSEC Take 20 mg by mouth daily.   polyethylene glycol 17 g packet Commonly known as: MIRALAX / GLYCOLAX Take 17 g by mouth daily as needed for mild constipation.   sertraline 25 MG tablet Commonly known as: ZOLOFT Take 25 mg by mouth daily.   Vitamin D 50 MCG (2000 UT) Caps Take 2,000 Units by mouth at bedtime.       Review of Systems  Constitutional: Negative for appetite change, fatigue and fever.       Weight loss about #15Ibs in 2 weeks.   HENT: Positive for congestion, hearing loss and trouble swallowing. Negative for voice change.        Trouble swallowing/risk for aspiration due to posture  Respiratory: Positive for cough and wheezing. Negative for choking, chest tightness and shortness of breath.        Congestive cough.  Cardiovascular: Negative for chest pain, palpitations and leg swelling.  Gastrointestinal: Negative for abdominal pain, constipation, nausea and vomiting.  Genitourinary: Positive for frequency. Negative  for dysuria, hematuria and urgency.  Musculoskeletal: Positive for arthralgias and gait problem.       R hip pain  Skin: Negative for color change.  Neurological: Negative for dizziness, speech difficulty, weakness and headaches.       Dementia  Psychiatric/Behavioral: Negative for behavioral problems and sleep disturbance. The patient is not nervous/anxious.        Flat affect, low voice    Immunization History  Administered Date(s) Administered  . Influenza, High Dose Seasonal PF 03/27/2019  . Influenza-Unspecified 03/27/2016, 03/19/2018, 04/12/2020  . PFIZER SARS-COV-2 Vaccination 07/17/2019, 08/07/2019  . Pneumococcal Conjugate-13 02/24/2014  . Pneumococcal Polysaccharide-23 04/25/2018  . Td 05/27/2012, 10/27/2016   Pertinent  Health Maintenance Due  Topic Date Due  . INFLUENZA VACCINE  Completed  . PNA vac Low Risk Adult  Completed  Fall Risk  07/16/2018  Falls in the past year? 0   Functional Status Survey:    Vitals:   05/18/20 1600  BP: 130/78  Pulse: 66  Resp: 18  Temp: 97.9 F (36.6 C)  TempSrc: Oral  SpO2: 97%  Weight: 142 lb 11.2 oz (64.7 kg)  Height: _0  (1.778 m)   Body mass index is 20.48 kg/m. Physical Exam Constitutional:      Comments: Appears tired.   HENT:     Head: Normocephalic and atraumatic.     Mouth/Throat:     Mouth: Mucous membranes are moist.  Eyes:     Extraocular Movements: Extraocular movements intact.     Conjunctiva/sclera: Conjunctivae normal.     Pupils: Pupils are equal, round, and reactive to light.  Cardiovascular:     Rate and Rhythm: Normal rate and regular rhythm.     Heart sounds: No murmur heard.   Pulmonary:     Effort: Pulmonary effort is normal.     Breath sounds: Wheezing present. No rhonchi or rales.     Comments: Posterior upper/mid lungs expiratory wheezes, mild.  Chest:     Chest wall: No tenderness.  Abdominal:     General: Bowel sounds are normal.     Palpations: Abdomen is soft.      Tenderness: There is no abdominal tenderness.  Musculoskeletal:     Cervical back: Normal range of motion and neck supple.     Right lower leg: No edema.     Left lower leg: No edema.  Skin:    General: Skin is warm and dry.  Neurological:     General: No focal deficit present.     Mental Status: He is alert. Mental status is at baseline.     Gait: Gait abnormal.     Comments: Oriented to person and place.   Psychiatric:        Mood and Affect: Mood normal.        Behavior: Behavior normal.        Thought Content: Thought content normal.     Labs reviewed: Recent Labs    03/22/20 0820 03/22/20 0820 03/23/20 0958 03/23/20 0958 03/24/20 0456 04/05/20 0000 04/22/20 0000 04/28/20 0000 05/18/20 0000  NA 139   < > 136   < > 135   < > 138 137 140  K 4.8   < > 3.9   < > 4.2   < > 4.7 5.2 4.6  CL 106   < > 103   < > 105   < > 102 101 106  CO2 23   < > 25   < > 23   < > 26* 27* 27*  GLUCOSE 110*  --  137*  --  93  --   --   --   --   BUN 30*   < > 32*   < > 44*   < > 30* 32* 26*  CREATININE 1.56*   < > 1.72*   < > 1.71*   < > 2.0* 2.0* 1.3  CALCIUM 9.1   < > 8.6*   < > 8.6*   < > 9.1 9.2 8.4*   < > = values in this interval not displayed.   Recent Labs    04/05/20 0000 04/22/20 0000 05/18/20 0000  AST _1 ALT 12 6* 8*  ALKPHOS 139* 112 92  ALBUMIN 3.2* 2.8* 2.6*   Recent Labs    03/22/20 0820  03/22/20 0820 03/23/20 0958 03/23/20 0958 03/24/20 0456 04/05/20 0000 04/07/20 0000 04/07/20 0000 04/14/20 0000 04/22/20 0000 05/18/20 0000  WBC 9.8   < > 11.1*   < > 10.1   < > 6.3   < > 9.0 9.0  6.4 5.4  NEUTROABS  --   --   --   --   --    < > 4,114.00  --  4,167.00 4,167.00  3,674.00  --   HGB 9.9*   < > 8.7*   < > 7.9*   < > 8.5*  --   --  9.9*  9.7* 8.6*  HCT 32.3*   < > 28.4*   < > 25.4*   < > 26*   < > 31* 31*  31* 27*  MCV 99.1  --  100.7*  --  99.2  --   --   --   --   --   --   PLT 195   < > 159   < > 151   < > 470*   < > 409* 409*  269 291   < >  = values in this interval not displayed.   Lab Results  Component Value Date   TSH 1.62 04/22/2020   Lab Results  Component Value Date   HGBA1C  12/13/2008    5.5 (NOTE) The ADA recommends the following therapeutic goal for glycemic control related to Hgb A1c measurement: Goal of therapy: <6.5 Hgb A1c  Reference: American Diabetes Association: Clinical Practice Recommendations 2010, Diabetes Care, 2010, 33: (Suppl  1).   No results found for: CHOL, HDL, LDLCALC, LDLDIRECT, TRIG, CHOLHDL  Significant Diagnostic Results in last 30 days:  XR HIP UNILAT W OR W/O PELVIS 2-3 VIEWS RIGHT  Result Date: 04/27/2020 Radiographs of right hip and pelvis show stable alignment of hardware. No complicating feature.    Assessment/Plan: Cough New onset today, congestive cough, wheezes, no choking episodes, but the patient is risk for aspiration per speech therapy eval due to his posture/kyphosis. The patient doesn't always remember to eat/drink in upright position. He is afebrile, no O2 desaturation. The patient denied chest pain/pressure, palpitation, SOB, abd pain, nausea, vomiting, or sputum production. Pending CBC, CMP. Will obtain CXR ap/lateral to evaluate further. Will have DuoNeb q8hr x 72hrs for wheezing cough.  05/18/20 wbc 5.4, Hgb 8.6, plt 291, Na 140, K 4.6, Bun 26, creat 1.32, eGFR 47 05/19/20 CXR no infiltrate, pulmonary venous congestion is present, may diurese the patient mildly if no better.    Dysphagia Will start honey thick liquids today.   Acute blood loss anemia 05/18/20 wbc 5.4, Hgb 8.6, plt 291, Na 140, K 4.6, Bun 26, creat 1.32, eGFR 47, continue Fe    Family/ staff Communication: plan of care reviewed with the patient and charge nurse.   Labs/tests ordered: CBC, CMP done today, will obtain stat CXR ap/lateral views  Time spend 35 minutes.

## 2020-05-18 NOTE — Assessment & Plan Note (Addendum)
New onset today, congestive cough, wheezes, no choking episodes, but the patient is risk for aspiration per speech therapy eval due to his posture/kyphosis. The patient doesn't always remember to eat/drink in upright position. He is afebrile, no O2 desaturation. The patient denied chest pain/pressure, palpitation, SOB, abd pain, nausea, vomiting, or sputum production. Pending CBC, CMP. Will obtain CXR ap/lateral to evaluate further. Will have DuoNeb q8hr x 72hrs for wheezing cough.  05/18/20 wbc 5.4, Hgb 8.6, plt 291, Na 140, K 4.6, Bun 26, creat 1.32, eGFR 47 05/19/20 CXR no infiltrate, pulmonary venous congestion is present, may diurese the patient mildly if no better.

## 2020-05-19 ENCOUNTER — Encounter: Payer: Self-pay | Admitting: Nurse Practitioner

## 2020-05-19 DIAGNOSIS — R1310 Dysphagia, unspecified: Secondary | ICD-10-CM | POA: Insufficient documentation

## 2020-05-19 NOTE — Assessment & Plan Note (Signed)
Will start honey thick liquids today.

## 2020-05-19 NOTE — Assessment & Plan Note (Addendum)
05/18/20 wbc 5.4, Hgb 8.6, plt 291, Na 140, K 4.6, Bun 26, creat 1.32, eGFR 47, continue Fe

## 2020-05-24 ENCOUNTER — Ambulatory Visit (INDEPENDENT_AMBULATORY_CARE_PROVIDER_SITE_OTHER): Payer: Medicare Other | Admitting: Family

## 2020-05-24 ENCOUNTER — Encounter: Payer: Self-pay | Admitting: Family

## 2020-05-24 DIAGNOSIS — S72145D Nondisplaced intertrochanteric fracture of left femur, subsequent encounter for closed fracture with routine healing: Secondary | ICD-10-CM

## 2020-05-24 NOTE — Progress Notes (Signed)
Office Visit Note   Patient: [name] [name]           Date of Birth: 01-02-1926           MRN: [id number] Visit Date: 05/24/2020              Requested by: [name], MD [address] [place],  [place] [address] PCP: [name], MD  Chief Complaint  Patient presents with  . Right Hip - Follow-up, Pain    Doing better ambulating with walker, discharged from [facility] this morning.       HPI: Patient is status post ORIF right IM nail inter trochanteric hip fracture.  [facility]  His home has been set up he is now ambulatory with a 2 wheel walker.  Walking independently.  Care complicated by alzheimer's dementia.  Assessment & Plan: Visit Diagnoses:  No diagnosis found.  Plan: Will continue with home PT, WBAT on RLE. Continue to work on mobilization.  No new orders today.  He will follow-up in the office as needed.  Follow-Up Instructions: No follow-ups on file.   Ortho Exam  Patient is alert, oriented, no adenopathy, well-dressed, normal affect, normal respiratory effort. No drainage no erythema surgical wound is well healed. No tenderness to greater trochanter. Painless rom of hip. Imaging: No results found. No images are attached to the encounter.  Labs: Lab Results  Component Value Date   HGBA1C  12/13/2008    5.5 (NOTE) The ADA recommends the following therapeutic goal for glycemic control related to Hgb A1c measurement: Goal of therapy: <6.5 Hgb A1c  Reference: American Diabetes Association: Clinical Practice Recommendations 2010, Diabetes Care, 2010, 33: (Suppl  1).   REPTSTATUS 06/10/2018 FINAL 06/05/2018   CULT  06/05/2018    NO GROWTH 5 DAYS Performed at [facility], [place] [address] [place], [place] [address]      Lab Results  Component Value Date   ALBUMIN 2.6 (A) 05/18/2020   ALBUMIN 2.8 (A) 04/22/2020   ALBUMIN 3.2 (A) 04/05/2020    Lab Results  Component Value Date   MG 2.2  12/16/2008   MG 2.5 12/15/2008   No results found for: VD25OH  No results found for: PREALBUMIN CBC EXTENDED Latest Ref Rng & Units 05/18/2020 04/22/2020 04/22/2020  WBC - 5.4 9.0 6.4  RBC 3.87 - 5.11 2.87(A) 3.28(A) 3.23(A)  HGB 13.5 - 17.5 8.6(A) 9.9(A) 9.7(A)  HCT 41 - 53 27(A) 31(A) 31(A)  PLT 150 - 399 291 409(A) 269  NEUTROABS - - 4,167.00 3,674.00  LYMPHSABS 0.7 - 4.0 K/uL - - -     There is no height or weight on file to calculate BMI.  Orders:  No orders of the defined types were placed in this encounter.  No orders of the defined types were placed in this encounter.    Procedures: No procedures performed  Clinical Data: No additional findings.  ROS:  All other systems negative, except as noted in the HPI. Review of Systems  Constitutional: Negative for chills and fever.  Musculoskeletal: Positive for arthralgias and myalgias.  Skin: Negative for wound.    Objective: Vital Signs: There were no vitals taken for this visit.  Specialty Comments:  No specialty comments available.  PMFS History: Patient Active Problem List   Diagnosis Date Noted  . Dysphagia 05/19/2020  . Cough 05/18/2020  . GERD (gastroesophageal reflux disease) 04/22/2020  . Weight loss 04/20/2020  . Slow  transit constipation 04/20/2020  . CKD (chronic kidney disease) stage 3, GFR 30-59 ml/min (HCC) 04/06/2020  . Right hip pain 04/04/2020  . Depression, recurrent ([place]) 04/04/2020  . Malaise 04/04/2020  . Acquired thrombophilia ([place]) 03/23/2020  . Hip fracture ([place]) 03/21/2020  . DNR (do not resuscitate) 03/21/2020  . Urinary urgency 01/01/2019  . Candidiasis 01/01/2019  . Closed displaced intertrochanteric fracture of left femur ([place]) 12/25/2018  . Closed intertrochanteric fracture of hip, left, initial encounter ([place]) 12/25/2018  . Pain due to onychomycosis of toenails of both feet 12/12/2018  . Acute blood loss anemia 06/08/2018  . GI bleed 06/05/2018  . HTN (hypertension)     . High cholesterol   . Diverticulosis   . Lower GI bleed   . AAA (abdominal aortic aneurysm) without rupture ([place]) 01/09/2018  . Ascending aortic aneurysm ([place]) 01/09/2018  . Heart block AV second degree 07/15/2017  . Status post three vessel coronary artery bypass 07/15/2017  . Left ventricular systolic dysfunction without heart failure 05/11/2017  . Hypercholesterolemia 05/11/2017  . CHB (complete heart block) ([place]) 06/07/2015  . Alzheimer's disease ([place]) 01/26/2015  . Pacemaker 01/26/2013  . Permanent atrial fibrillation ([place]) 01/26/2013  . Second degree AV block, Mobitz type II 01/26/2013  . Coronary artery disease involving native coronary artery of native heart without angina pectoris 01/26/2013  . Dyspnea 01/26/2013   Past Medical History:  Diagnosis Date  . Atrial fibrillation, permanent ([place])    no longer on AC due to GI bleed  . CAD (coronary artery disease)   . Dementia ([place])   . Diverticulosis    diverticular bleed 2011  . High cholesterol   . HTN (hypertension)    has improved over time, no rx as of 2019  . Lower GI bleed     Family History  Problem Relation Age of Onset  . Stroke Father   . Cervical cancer Other        siblings  . COPD Sister   . Dementia Brother     Past Surgical History:  Procedure Laterality Date  . angiolplasty    . APPENDECTOMY    . INSERT / REPLACE / REMOVE PACEMAKER    . INTRAMEDULLARY (IM) NAIL INTERTROCHANTERIC Left 12/26/2018   Procedure: INTRAMEDULLARY (IM) NAIL INTERTROCHANTRIC;  Surgeon: Leandrew Koyanagi, MD;  Location: [facility];  Service: Orthopedics;  Laterality: Left;  . INTRAMEDULLARY (IM) NAIL INTERTROCHANTERIC Right 03/22/2020   Procedure: INTRAMEDULLARY (IM) NAIL INTERTROCHANTRIC;  Surgeon: Newt Minion, MD;  Location: [facility];  Service: Orthopedics;  Laterality: Right;  . KNEE SURGERY    . PILONIDAL CYST EXCISION    . rotator cuff surgery  1985  . TONSILLECTOMY    . triple heart bypass     Social History    Occupational History  . Occupation: retired  Tobacco Use  . Smoking status: Former Smoker    Quit date: 07/02/1974    Years since quitting: 45.9  . Smokeless tobacco: Never Used  Vaping Use  . Vaping Use: Never used  Substance and Sexual Activity  . Alcohol use: Not Currently    Comment: last used in dec 2019  . Drug use: No  . Sexual activity: Not on file

## 2020-05-25 ENCOUNTER — Other Ambulatory Visit: Payer: Self-pay

## 2020-05-25 ENCOUNTER — Encounter: Payer: Self-pay | Admitting: Podiatry

## 2020-05-25 ENCOUNTER — Ambulatory Visit: Payer: Medicare Other | Admitting: Podiatry

## 2020-05-25 ENCOUNTER — Ambulatory Visit (INDEPENDENT_AMBULATORY_CARE_PROVIDER_SITE_OTHER): Payer: Medicare Other | Admitting: Podiatry

## 2020-05-25 DIAGNOSIS — Z7982 Long term (current) use of aspirin: Secondary | ICD-10-CM | POA: Diagnosis not present

## 2020-05-25 DIAGNOSIS — Z9049 Acquired absence of other specified parts of digestive tract: Secondary | ICD-10-CM | POA: Diagnosis not present

## 2020-05-25 DIAGNOSIS — N184 Chronic kidney disease, stage 4 (severe): Secondary | ICD-10-CM | POA: Diagnosis not present

## 2020-05-25 DIAGNOSIS — R634 Abnormal weight loss: Secondary | ICD-10-CM | POA: Diagnosis not present

## 2020-05-25 DIAGNOSIS — S72001D Fracture of unspecified part of neck of right femur, subsequent encounter for closed fracture with routine healing: Secondary | ICD-10-CM | POA: Diagnosis not present

## 2020-05-25 DIAGNOSIS — Z951 Presence of aortocoronary bypass graft: Secondary | ICD-10-CM | POA: Diagnosis not present

## 2020-05-25 DIAGNOSIS — D509 Iron deficiency anemia, unspecified: Secondary | ICD-10-CM | POA: Diagnosis not present

## 2020-05-25 DIAGNOSIS — N183 Chronic kidney disease, stage 3 unspecified: Secondary | ICD-10-CM

## 2020-05-25 DIAGNOSIS — I129 Hypertensive chronic kidney disease with stage 1 through stage 4 chronic kidney disease, or unspecified chronic kidney disease: Secondary | ICD-10-CM | POA: Diagnosis not present

## 2020-05-25 DIAGNOSIS — I4821 Permanent atrial fibrillation: Secondary | ICD-10-CM | POA: Diagnosis not present

## 2020-05-25 DIAGNOSIS — N1832 Chronic kidney disease, stage 3b: Secondary | ICD-10-CM | POA: Diagnosis not present

## 2020-05-25 DIAGNOSIS — M79675 Pain in left toe(s): Secondary | ICD-10-CM | POA: Diagnosis not present

## 2020-05-25 DIAGNOSIS — Z9181 History of falling: Secondary | ICD-10-CM | POA: Diagnosis not present

## 2020-05-25 DIAGNOSIS — I2510 Atherosclerotic heart disease of native coronary artery without angina pectoris: Secondary | ICD-10-CM | POA: Diagnosis not present

## 2020-05-25 DIAGNOSIS — Z950 Presence of cardiac pacemaker: Secondary | ICD-10-CM | POA: Diagnosis not present

## 2020-05-25 DIAGNOSIS — B351 Tinea unguium: Secondary | ICD-10-CM

## 2020-05-25 DIAGNOSIS — I714 Abdominal aortic aneurysm, without rupture: Secondary | ICD-10-CM | POA: Diagnosis not present

## 2020-05-25 DIAGNOSIS — F32A Depression, unspecified: Secondary | ICD-10-CM | POA: Diagnosis not present

## 2020-05-25 DIAGNOSIS — W19XXXD Unspecified fall, subsequent encounter: Secondary | ICD-10-CM | POA: Diagnosis not present

## 2020-05-25 DIAGNOSIS — K219 Gastro-esophageal reflux disease without esophagitis: Secondary | ICD-10-CM | POA: Diagnosis not present

## 2020-05-25 DIAGNOSIS — I442 Atrioventricular block, complete: Secondary | ICD-10-CM | POA: Diagnosis not present

## 2020-05-25 DIAGNOSIS — M79674 Pain in right toe(s): Secondary | ICD-10-CM | POA: Diagnosis not present

## 2020-05-25 DIAGNOSIS — Z79891 Long term (current) use of opiate analgesic: Secondary | ICD-10-CM | POA: Diagnosis not present

## 2020-05-25 DIAGNOSIS — K5790 Diverticulosis of intestine, part unspecified, without perforation or abscess without bleeding: Secondary | ICD-10-CM | POA: Diagnosis not present

## 2020-05-25 DIAGNOSIS — Z9089 Acquired absence of other organs: Secondary | ICD-10-CM | POA: Diagnosis not present

## 2020-05-25 DIAGNOSIS — F0281 Dementia in other diseases classified elsewhere with behavioral disturbance: Secondary | ICD-10-CM | POA: Diagnosis not present

## 2020-05-25 DIAGNOSIS — G301 Alzheimer's disease with late onset: Secondary | ICD-10-CM | POA: Diagnosis not present

## 2020-05-25 DIAGNOSIS — E785 Hyperlipidemia, unspecified: Secondary | ICD-10-CM | POA: Diagnosis not present

## 2020-05-25 NOTE — Progress Notes (Signed)
This patient returns to the office for evaluation and treatment of long thick painful nails .  This patient is unable to trim his own nails since the patient cannot reach the feet.  Patient says the nails are painful walking and wearing his shoes.  He returns for preventive foot care services. He presents to the office with wife.  General Appearance  Alert, conversant and in no acute stress.  Vascular  Dorsalis pedis and posterior tibial  pulses are not   palpable  bilaterally.  Capillary return is within normal limits  bilaterally. Temperature is within normal limits  Bilaterally. Absent hair.  Neurologic  Senn-Weinstein monofilament wire test within normal limits  bilaterally. Muscle power within normal limits bilaterally.  Nails Thick disfigured discolored nails with subungual debris  from hallux to fifth toes bilaterally. No evidence of bacterial infection or drainage bilaterally.  Orthopedic  No limitations of motion  feet .  No crepitus or effusions noted.  No bony pathology or digital deformities noted.  Skin  normotropic skin with no porokeratosis noted bilaterally.  No signs of infections or ulcers noted.     Onychomycosis  Pain in toes right foot  Pain in toes left foot  Debridement  of nails  1-5  B/L with a nail nipper.  Nails were then filed using a dremel tool with no incidents.    RTC  10 weeks   Gardiner Barefoot DPM

## 2020-05-30 DIAGNOSIS — I442 Atrioventricular block, complete: Secondary | ICD-10-CM | POA: Diagnosis not present

## 2020-05-30 DIAGNOSIS — I2510 Atherosclerotic heart disease of native coronary artery without angina pectoris: Secondary | ICD-10-CM | POA: Diagnosis not present

## 2020-05-30 DIAGNOSIS — E785 Hyperlipidemia, unspecified: Secondary | ICD-10-CM | POA: Diagnosis not present

## 2020-05-30 DIAGNOSIS — I129 Hypertensive chronic kidney disease with stage 1 through stage 4 chronic kidney disease, or unspecified chronic kidney disease: Secondary | ICD-10-CM | POA: Diagnosis not present

## 2020-05-30 DIAGNOSIS — N184 Chronic kidney disease, stage 4 (severe): Secondary | ICD-10-CM | POA: Diagnosis not present

## 2020-05-30 DIAGNOSIS — S72001D Fracture of unspecified part of neck of right femur, subsequent encounter for closed fracture with routine healing: Secondary | ICD-10-CM | POA: Diagnosis not present

## 2020-05-31 DIAGNOSIS — I442 Atrioventricular block, complete: Secondary | ICD-10-CM | POA: Diagnosis not present

## 2020-05-31 DIAGNOSIS — S72001D Fracture of unspecified part of neck of right femur, subsequent encounter for closed fracture with routine healing: Secondary | ICD-10-CM | POA: Diagnosis not present

## 2020-05-31 DIAGNOSIS — E785 Hyperlipidemia, unspecified: Secondary | ICD-10-CM | POA: Diagnosis not present

## 2020-05-31 DIAGNOSIS — I129 Hypertensive chronic kidney disease with stage 1 through stage 4 chronic kidney disease, or unspecified chronic kidney disease: Secondary | ICD-10-CM | POA: Diagnosis not present

## 2020-05-31 DIAGNOSIS — N184 Chronic kidney disease, stage 4 (severe): Secondary | ICD-10-CM | POA: Diagnosis not present

## 2020-05-31 DIAGNOSIS — I2510 Atherosclerotic heart disease of native coronary artery without angina pectoris: Secondary | ICD-10-CM | POA: Diagnosis not present

## 2020-06-01 DIAGNOSIS — I129 Hypertensive chronic kidney disease with stage 1 through stage 4 chronic kidney disease, or unspecified chronic kidney disease: Secondary | ICD-10-CM | POA: Diagnosis not present

## 2020-06-01 DIAGNOSIS — N184 Chronic kidney disease, stage 4 (severe): Secondary | ICD-10-CM | POA: Diagnosis not present

## 2020-06-01 DIAGNOSIS — I442 Atrioventricular block, complete: Secondary | ICD-10-CM | POA: Diagnosis not present

## 2020-06-01 DIAGNOSIS — E785 Hyperlipidemia, unspecified: Secondary | ICD-10-CM | POA: Diagnosis not present

## 2020-06-01 DIAGNOSIS — I2510 Atherosclerotic heart disease of native coronary artery without angina pectoris: Secondary | ICD-10-CM | POA: Diagnosis not present

## 2020-06-01 DIAGNOSIS — S72001D Fracture of unspecified part of neck of right femur, subsequent encounter for closed fracture with routine healing: Secondary | ICD-10-CM | POA: Diagnosis not present

## 2020-06-02 DIAGNOSIS — E785 Hyperlipidemia, unspecified: Secondary | ICD-10-CM | POA: Diagnosis not present

## 2020-06-02 DIAGNOSIS — N184 Chronic kidney disease, stage 4 (severe): Secondary | ICD-10-CM | POA: Diagnosis not present

## 2020-06-02 DIAGNOSIS — I129 Hypertensive chronic kidney disease with stage 1 through stage 4 chronic kidney disease, or unspecified chronic kidney disease: Secondary | ICD-10-CM | POA: Diagnosis not present

## 2020-06-02 DIAGNOSIS — S72001D Fracture of unspecified part of neck of right femur, subsequent encounter for closed fracture with routine healing: Secondary | ICD-10-CM | POA: Diagnosis not present

## 2020-06-02 DIAGNOSIS — I442 Atrioventricular block, complete: Secondary | ICD-10-CM | POA: Diagnosis not present

## 2020-06-02 DIAGNOSIS — I2510 Atherosclerotic heart disease of native coronary artery without angina pectoris: Secondary | ICD-10-CM | POA: Diagnosis not present

## 2020-06-06 DIAGNOSIS — I129 Hypertensive chronic kidney disease with stage 1 through stage 4 chronic kidney disease, or unspecified chronic kidney disease: Secondary | ICD-10-CM | POA: Diagnosis not present

## 2020-06-06 DIAGNOSIS — I442 Atrioventricular block, complete: Secondary | ICD-10-CM | POA: Diagnosis not present

## 2020-06-06 DIAGNOSIS — I2510 Atherosclerotic heart disease of native coronary artery without angina pectoris: Secondary | ICD-10-CM | POA: Diagnosis not present

## 2020-06-06 DIAGNOSIS — E785 Hyperlipidemia, unspecified: Secondary | ICD-10-CM | POA: Diagnosis not present

## 2020-06-06 DIAGNOSIS — S72001D Fracture of unspecified part of neck of right femur, subsequent encounter for closed fracture with routine healing: Secondary | ICD-10-CM | POA: Diagnosis not present

## 2020-06-06 DIAGNOSIS — N184 Chronic kidney disease, stage 4 (severe): Secondary | ICD-10-CM | POA: Diagnosis not present

## 2020-06-08 DIAGNOSIS — I2510 Atherosclerotic heart disease of native coronary artery without angina pectoris: Secondary | ICD-10-CM | POA: Diagnosis not present

## 2020-06-08 DIAGNOSIS — N184 Chronic kidney disease, stage 4 (severe): Secondary | ICD-10-CM | POA: Diagnosis not present

## 2020-06-08 DIAGNOSIS — E785 Hyperlipidemia, unspecified: Secondary | ICD-10-CM | POA: Diagnosis not present

## 2020-06-08 DIAGNOSIS — S72001D Fracture of unspecified part of neck of right femur, subsequent encounter for closed fracture with routine healing: Secondary | ICD-10-CM | POA: Diagnosis not present

## 2020-06-08 DIAGNOSIS — I129 Hypertensive chronic kidney disease with stage 1 through stage 4 chronic kidney disease, or unspecified chronic kidney disease: Secondary | ICD-10-CM | POA: Diagnosis not present

## 2020-06-08 DIAGNOSIS — I442 Atrioventricular block, complete: Secondary | ICD-10-CM | POA: Diagnosis not present

## 2020-06-13 DIAGNOSIS — I2510 Atherosclerotic heart disease of native coronary artery without angina pectoris: Secondary | ICD-10-CM | POA: Diagnosis not present

## 2020-06-13 DIAGNOSIS — S72001D Fracture of unspecified part of neck of right femur, subsequent encounter for closed fracture with routine healing: Secondary | ICD-10-CM | POA: Diagnosis not present

## 2020-06-13 DIAGNOSIS — I129 Hypertensive chronic kidney disease with stage 1 through stage 4 chronic kidney disease, or unspecified chronic kidney disease: Secondary | ICD-10-CM | POA: Diagnosis not present

## 2020-06-13 DIAGNOSIS — I442 Atrioventricular block, complete: Secondary | ICD-10-CM | POA: Diagnosis not present

## 2020-06-13 DIAGNOSIS — N184 Chronic kidney disease, stage 4 (severe): Secondary | ICD-10-CM | POA: Diagnosis not present

## 2020-06-13 DIAGNOSIS — E785 Hyperlipidemia, unspecified: Secondary | ICD-10-CM | POA: Diagnosis not present

## 2020-06-14 DIAGNOSIS — I129 Hypertensive chronic kidney disease with stage 1 through stage 4 chronic kidney disease, or unspecified chronic kidney disease: Secondary | ICD-10-CM | POA: Diagnosis not present

## 2020-06-14 DIAGNOSIS — I700 Atherosclerosis of aorta: Secondary | ICD-10-CM | POA: Diagnosis not present

## 2020-06-14 DIAGNOSIS — I2510 Atherosclerotic heart disease of native coronary artery without angina pectoris: Secondary | ICD-10-CM | POA: Diagnosis not present

## 2020-06-14 DIAGNOSIS — I442 Atrioventricular block, complete: Secondary | ICD-10-CM | POA: Diagnosis not present

## 2020-06-14 DIAGNOSIS — S72001D Fracture of unspecified part of neck of right femur, subsequent encounter for closed fracture with routine healing: Secondary | ICD-10-CM | POA: Diagnosis not present

## 2020-06-14 DIAGNOSIS — K219 Gastro-esophageal reflux disease without esophagitis: Secondary | ICD-10-CM | POA: Diagnosis not present

## 2020-06-14 DIAGNOSIS — N1832 Chronic kidney disease, stage 3b: Secondary | ICD-10-CM | POA: Diagnosis not present

## 2020-06-14 DIAGNOSIS — I10 Essential (primary) hypertension: Secondary | ICD-10-CM | POA: Diagnosis not present

## 2020-06-14 DIAGNOSIS — G301 Alzheimer's disease with late onset: Secondary | ICD-10-CM | POA: Diagnosis not present

## 2020-06-14 DIAGNOSIS — Z0000 Encounter for general adult medical examination without abnormal findings: Secondary | ICD-10-CM | POA: Diagnosis not present

## 2020-06-14 DIAGNOSIS — Z1389 Encounter for screening for other disorder: Secondary | ICD-10-CM | POA: Diagnosis not present

## 2020-06-14 DIAGNOSIS — I4819 Other persistent atrial fibrillation: Secondary | ICD-10-CM | POA: Diagnosis not present

## 2020-06-14 DIAGNOSIS — N184 Chronic kidney disease, stage 4 (severe): Secondary | ICD-10-CM | POA: Diagnosis not present

## 2020-06-14 DIAGNOSIS — K7030 Alcoholic cirrhosis of liver without ascites: Secondary | ICD-10-CM | POA: Diagnosis not present

## 2020-06-14 DIAGNOSIS — E785 Hyperlipidemia, unspecified: Secondary | ICD-10-CM | POA: Diagnosis not present

## 2020-06-14 DIAGNOSIS — Z950 Presence of cardiac pacemaker: Secondary | ICD-10-CM | POA: Diagnosis not present

## 2020-06-14 DIAGNOSIS — I714 Abdominal aortic aneurysm, without rupture: Secondary | ICD-10-CM | POA: Diagnosis not present

## 2020-06-14 DIAGNOSIS — E7800 Pure hypercholesterolemia, unspecified: Secondary | ICD-10-CM | POA: Diagnosis not present

## 2020-06-15 DIAGNOSIS — E785 Hyperlipidemia, unspecified: Secondary | ICD-10-CM | POA: Diagnosis not present

## 2020-06-15 DIAGNOSIS — I2510 Atherosclerotic heart disease of native coronary artery without angina pectoris: Secondary | ICD-10-CM | POA: Diagnosis not present

## 2020-06-15 DIAGNOSIS — I129 Hypertensive chronic kidney disease with stage 1 through stage 4 chronic kidney disease, or unspecified chronic kidney disease: Secondary | ICD-10-CM | POA: Diagnosis not present

## 2020-06-15 DIAGNOSIS — N184 Chronic kidney disease, stage 4 (severe): Secondary | ICD-10-CM | POA: Diagnosis not present

## 2020-06-15 DIAGNOSIS — I442 Atrioventricular block, complete: Secondary | ICD-10-CM | POA: Diagnosis not present

## 2020-06-15 DIAGNOSIS — S72001D Fracture of unspecified part of neck of right femur, subsequent encounter for closed fracture with routine healing: Secondary | ICD-10-CM | POA: Diagnosis not present

## 2020-06-17 DIAGNOSIS — S72001D Fracture of unspecified part of neck of right femur, subsequent encounter for closed fracture with routine healing: Secondary | ICD-10-CM | POA: Diagnosis not present

## 2020-06-17 DIAGNOSIS — I129 Hypertensive chronic kidney disease with stage 1 through stage 4 chronic kidney disease, or unspecified chronic kidney disease: Secondary | ICD-10-CM | POA: Diagnosis not present

## 2020-06-17 DIAGNOSIS — E785 Hyperlipidemia, unspecified: Secondary | ICD-10-CM | POA: Diagnosis not present

## 2020-06-17 DIAGNOSIS — I442 Atrioventricular block, complete: Secondary | ICD-10-CM | POA: Diagnosis not present

## 2020-06-17 DIAGNOSIS — I2510 Atherosclerotic heart disease of native coronary artery without angina pectoris: Secondary | ICD-10-CM | POA: Diagnosis not present

## 2020-06-17 DIAGNOSIS — N184 Chronic kidney disease, stage 4 (severe): Secondary | ICD-10-CM | POA: Diagnosis not present

## 2020-06-20 DIAGNOSIS — E785 Hyperlipidemia, unspecified: Secondary | ICD-10-CM | POA: Diagnosis not present

## 2020-06-20 DIAGNOSIS — I129 Hypertensive chronic kidney disease with stage 1 through stage 4 chronic kidney disease, or unspecified chronic kidney disease: Secondary | ICD-10-CM | POA: Diagnosis not present

## 2020-06-20 DIAGNOSIS — I2510 Atherosclerotic heart disease of native coronary artery without angina pectoris: Secondary | ICD-10-CM | POA: Diagnosis not present

## 2020-06-20 DIAGNOSIS — S72001D Fracture of unspecified part of neck of right femur, subsequent encounter for closed fracture with routine healing: Secondary | ICD-10-CM | POA: Diagnosis not present

## 2020-06-20 DIAGNOSIS — N184 Chronic kidney disease, stage 4 (severe): Secondary | ICD-10-CM | POA: Diagnosis not present

## 2020-06-20 DIAGNOSIS — I442 Atrioventricular block, complete: Secondary | ICD-10-CM | POA: Diagnosis not present

## 2020-06-21 DIAGNOSIS — I129 Hypertensive chronic kidney disease with stage 1 through stage 4 chronic kidney disease, or unspecified chronic kidney disease: Secondary | ICD-10-CM | POA: Diagnosis not present

## 2020-06-21 DIAGNOSIS — N184 Chronic kidney disease, stage 4 (severe): Secondary | ICD-10-CM | POA: Diagnosis not present

## 2020-06-21 DIAGNOSIS — S72001D Fracture of unspecified part of neck of right femur, subsequent encounter for closed fracture with routine healing: Secondary | ICD-10-CM | POA: Diagnosis not present

## 2020-06-21 DIAGNOSIS — E785 Hyperlipidemia, unspecified: Secondary | ICD-10-CM | POA: Diagnosis not present

## 2020-06-21 DIAGNOSIS — I2510 Atherosclerotic heart disease of native coronary artery without angina pectoris: Secondary | ICD-10-CM | POA: Diagnosis not present

## 2020-06-21 DIAGNOSIS — I442 Atrioventricular block, complete: Secondary | ICD-10-CM | POA: Diagnosis not present

## 2020-06-22 DIAGNOSIS — I129 Hypertensive chronic kidney disease with stage 1 through stage 4 chronic kidney disease, or unspecified chronic kidney disease: Secondary | ICD-10-CM | POA: Diagnosis not present

## 2020-06-22 DIAGNOSIS — E785 Hyperlipidemia, unspecified: Secondary | ICD-10-CM | POA: Diagnosis not present

## 2020-06-22 DIAGNOSIS — I442 Atrioventricular block, complete: Secondary | ICD-10-CM | POA: Diagnosis not present

## 2020-06-22 DIAGNOSIS — I2510 Atherosclerotic heart disease of native coronary artery without angina pectoris: Secondary | ICD-10-CM | POA: Diagnosis not present

## 2020-06-22 DIAGNOSIS — S72001D Fracture of unspecified part of neck of right femur, subsequent encounter for closed fracture with routine healing: Secondary | ICD-10-CM | POA: Diagnosis not present

## 2020-06-22 DIAGNOSIS — N184 Chronic kidney disease, stage 4 (severe): Secondary | ICD-10-CM | POA: Diagnosis not present

## 2020-06-24 DIAGNOSIS — W19XXXD Unspecified fall, subsequent encounter: Secondary | ICD-10-CM | POA: Diagnosis not present

## 2020-06-24 DIAGNOSIS — Z9181 History of falling: Secondary | ICD-10-CM | POA: Diagnosis not present

## 2020-06-24 DIAGNOSIS — R634 Abnormal weight loss: Secondary | ICD-10-CM | POA: Diagnosis not present

## 2020-06-24 DIAGNOSIS — Z79891 Long term (current) use of opiate analgesic: Secondary | ICD-10-CM | POA: Diagnosis not present

## 2020-06-24 DIAGNOSIS — Z950 Presence of cardiac pacemaker: Secondary | ICD-10-CM | POA: Diagnosis not present

## 2020-06-24 DIAGNOSIS — Z7982 Long term (current) use of aspirin: Secondary | ICD-10-CM | POA: Diagnosis not present

## 2020-06-24 DIAGNOSIS — K5790 Diverticulosis of intestine, part unspecified, without perforation or abscess without bleeding: Secondary | ICD-10-CM | POA: Diagnosis not present

## 2020-06-24 DIAGNOSIS — K219 Gastro-esophageal reflux disease without esophagitis: Secondary | ICD-10-CM | POA: Diagnosis not present

## 2020-06-24 DIAGNOSIS — D509 Iron deficiency anemia, unspecified: Secondary | ICD-10-CM | POA: Diagnosis not present

## 2020-06-24 DIAGNOSIS — I2510 Atherosclerotic heart disease of native coronary artery without angina pectoris: Secondary | ICD-10-CM | POA: Diagnosis not present

## 2020-06-24 DIAGNOSIS — I714 Abdominal aortic aneurysm, without rupture: Secondary | ICD-10-CM | POA: Diagnosis not present

## 2020-06-24 DIAGNOSIS — I129 Hypertensive chronic kidney disease with stage 1 through stage 4 chronic kidney disease, or unspecified chronic kidney disease: Secondary | ICD-10-CM | POA: Diagnosis not present

## 2020-06-24 DIAGNOSIS — Z9089 Acquired absence of other organs: Secondary | ICD-10-CM | POA: Diagnosis not present

## 2020-06-24 DIAGNOSIS — E785 Hyperlipidemia, unspecified: Secondary | ICD-10-CM | POA: Diagnosis not present

## 2020-06-24 DIAGNOSIS — F32A Depression, unspecified: Secondary | ICD-10-CM | POA: Diagnosis not present

## 2020-06-24 DIAGNOSIS — I4821 Permanent atrial fibrillation: Secondary | ICD-10-CM | POA: Diagnosis not present

## 2020-06-24 DIAGNOSIS — G301 Alzheimer's disease with late onset: Secondary | ICD-10-CM | POA: Diagnosis not present

## 2020-06-24 DIAGNOSIS — S72001D Fracture of unspecified part of neck of right femur, subsequent encounter for closed fracture with routine healing: Secondary | ICD-10-CM | POA: Diagnosis not present

## 2020-06-24 DIAGNOSIS — Z951 Presence of aortocoronary bypass graft: Secondary | ICD-10-CM | POA: Diagnosis not present

## 2020-06-24 DIAGNOSIS — F0281 Dementia in other diseases classified elsewhere with behavioral disturbance: Secondary | ICD-10-CM | POA: Diagnosis not present

## 2020-06-24 DIAGNOSIS — N184 Chronic kidney disease, stage 4 (severe): Secondary | ICD-10-CM | POA: Diagnosis not present

## 2020-06-24 DIAGNOSIS — Z9049 Acquired absence of other specified parts of digestive tract: Secondary | ICD-10-CM | POA: Diagnosis not present

## 2020-06-24 DIAGNOSIS — I442 Atrioventricular block, complete: Secondary | ICD-10-CM | POA: Diagnosis not present

## 2020-06-27 DIAGNOSIS — I129 Hypertensive chronic kidney disease with stage 1 through stage 4 chronic kidney disease, or unspecified chronic kidney disease: Secondary | ICD-10-CM | POA: Diagnosis not present

## 2020-06-27 DIAGNOSIS — I442 Atrioventricular block, complete: Secondary | ICD-10-CM | POA: Diagnosis not present

## 2020-06-27 DIAGNOSIS — S72001D Fracture of unspecified part of neck of right femur, subsequent encounter for closed fracture with routine healing: Secondary | ICD-10-CM | POA: Diagnosis not present

## 2020-06-27 DIAGNOSIS — E785 Hyperlipidemia, unspecified: Secondary | ICD-10-CM | POA: Diagnosis not present

## 2020-06-27 DIAGNOSIS — I2510 Atherosclerotic heart disease of native coronary artery without angina pectoris: Secondary | ICD-10-CM | POA: Diagnosis not present

## 2020-06-27 DIAGNOSIS — N184 Chronic kidney disease, stage 4 (severe): Secondary | ICD-10-CM | POA: Diagnosis not present

## 2020-06-29 DIAGNOSIS — I442 Atrioventricular block, complete: Secondary | ICD-10-CM | POA: Diagnosis not present

## 2020-06-29 DIAGNOSIS — E785 Hyperlipidemia, unspecified: Secondary | ICD-10-CM | POA: Diagnosis not present

## 2020-06-29 DIAGNOSIS — S72001D Fracture of unspecified part of neck of right femur, subsequent encounter for closed fracture with routine healing: Secondary | ICD-10-CM | POA: Diagnosis not present

## 2020-06-29 DIAGNOSIS — I129 Hypertensive chronic kidney disease with stage 1 through stage 4 chronic kidney disease, or unspecified chronic kidney disease: Secondary | ICD-10-CM | POA: Diagnosis not present

## 2020-06-29 DIAGNOSIS — N184 Chronic kidney disease, stage 4 (severe): Secondary | ICD-10-CM | POA: Diagnosis not present

## 2020-06-29 DIAGNOSIS — I2510 Atherosclerotic heart disease of native coronary artery without angina pectoris: Secondary | ICD-10-CM | POA: Diagnosis not present

## 2020-07-08 DIAGNOSIS — I129 Hypertensive chronic kidney disease with stage 1 through stage 4 chronic kidney disease, or unspecified chronic kidney disease: Secondary | ICD-10-CM | POA: Diagnosis not present

## 2020-07-08 DIAGNOSIS — I442 Atrioventricular block, complete: Secondary | ICD-10-CM | POA: Diagnosis not present

## 2020-07-08 DIAGNOSIS — E785 Hyperlipidemia, unspecified: Secondary | ICD-10-CM | POA: Diagnosis not present

## 2020-07-08 DIAGNOSIS — S72001D Fracture of unspecified part of neck of right femur, subsequent encounter for closed fracture with routine healing: Secondary | ICD-10-CM | POA: Diagnosis not present

## 2020-07-08 DIAGNOSIS — I2510 Atherosclerotic heart disease of native coronary artery without angina pectoris: Secondary | ICD-10-CM | POA: Diagnosis not present

## 2020-07-08 DIAGNOSIS — N184 Chronic kidney disease, stage 4 (severe): Secondary | ICD-10-CM | POA: Diagnosis not present

## 2020-07-15 DIAGNOSIS — I129 Hypertensive chronic kidney disease with stage 1 through stage 4 chronic kidney disease, or unspecified chronic kidney disease: Secondary | ICD-10-CM | POA: Diagnosis not present

## 2020-07-15 DIAGNOSIS — I442 Atrioventricular block, complete: Secondary | ICD-10-CM | POA: Diagnosis not present

## 2020-07-15 DIAGNOSIS — I2510 Atherosclerotic heart disease of native coronary artery without angina pectoris: Secondary | ICD-10-CM | POA: Diagnosis not present

## 2020-07-15 DIAGNOSIS — E785 Hyperlipidemia, unspecified: Secondary | ICD-10-CM | POA: Diagnosis not present

## 2020-07-15 DIAGNOSIS — S72001D Fracture of unspecified part of neck of right femur, subsequent encounter for closed fracture with routine healing: Secondary | ICD-10-CM | POA: Diagnosis not present

## 2020-07-15 DIAGNOSIS — N184 Chronic kidney disease, stage 4 (severe): Secondary | ICD-10-CM | POA: Diagnosis not present

## 2020-07-21 ENCOUNTER — Ambulatory Visit: Payer: Medicare Other | Admitting: Neurology

## 2020-07-21 DIAGNOSIS — I442 Atrioventricular block, complete: Secondary | ICD-10-CM | POA: Diagnosis not present

## 2020-07-21 DIAGNOSIS — I129 Hypertensive chronic kidney disease with stage 1 through stage 4 chronic kidney disease, or unspecified chronic kidney disease: Secondary | ICD-10-CM | POA: Diagnosis not present

## 2020-07-21 DIAGNOSIS — I2510 Atherosclerotic heart disease of native coronary artery without angina pectoris: Secondary | ICD-10-CM | POA: Diagnosis not present

## 2020-07-21 DIAGNOSIS — E785 Hyperlipidemia, unspecified: Secondary | ICD-10-CM | POA: Diagnosis not present

## 2020-07-21 DIAGNOSIS — N184 Chronic kidney disease, stage 4 (severe): Secondary | ICD-10-CM | POA: Diagnosis not present

## 2020-07-21 DIAGNOSIS — S72001D Fracture of unspecified part of neck of right femur, subsequent encounter for closed fracture with routine healing: Secondary | ICD-10-CM | POA: Diagnosis not present

## 2020-07-24 DIAGNOSIS — I442 Atrioventricular block, complete: Secondary | ICD-10-CM | POA: Diagnosis not present

## 2020-07-24 DIAGNOSIS — R634 Abnormal weight loss: Secondary | ICD-10-CM | POA: Diagnosis not present

## 2020-07-24 DIAGNOSIS — I4821 Permanent atrial fibrillation: Secondary | ICD-10-CM | POA: Diagnosis not present

## 2020-07-24 DIAGNOSIS — Z9049 Acquired absence of other specified parts of digestive tract: Secondary | ICD-10-CM | POA: Diagnosis not present

## 2020-07-24 DIAGNOSIS — K5790 Diverticulosis of intestine, part unspecified, without perforation or abscess without bleeding: Secondary | ICD-10-CM | POA: Diagnosis not present

## 2020-07-24 DIAGNOSIS — N184 Chronic kidney disease, stage 4 (severe): Secondary | ICD-10-CM | POA: Diagnosis not present

## 2020-07-24 DIAGNOSIS — E785 Hyperlipidemia, unspecified: Secondary | ICD-10-CM | POA: Diagnosis not present

## 2020-07-24 DIAGNOSIS — I2510 Atherosclerotic heart disease of native coronary artery without angina pectoris: Secondary | ICD-10-CM | POA: Diagnosis not present

## 2020-07-24 DIAGNOSIS — Z951 Presence of aortocoronary bypass graft: Secondary | ICD-10-CM | POA: Diagnosis not present

## 2020-07-24 DIAGNOSIS — S72001D Fracture of unspecified part of neck of right femur, subsequent encounter for closed fracture with routine healing: Secondary | ICD-10-CM | POA: Diagnosis not present

## 2020-07-24 DIAGNOSIS — Z7982 Long term (current) use of aspirin: Secondary | ICD-10-CM | POA: Diagnosis not present

## 2020-07-24 DIAGNOSIS — F32A Depression, unspecified: Secondary | ICD-10-CM | POA: Diagnosis not present

## 2020-07-24 DIAGNOSIS — Z9181 History of falling: Secondary | ICD-10-CM | POA: Diagnosis not present

## 2020-07-24 DIAGNOSIS — F0281 Dementia in other diseases classified elsewhere with behavioral disturbance: Secondary | ICD-10-CM | POA: Diagnosis not present

## 2020-07-24 DIAGNOSIS — I129 Hypertensive chronic kidney disease with stage 1 through stage 4 chronic kidney disease, or unspecified chronic kidney disease: Secondary | ICD-10-CM | POA: Diagnosis not present

## 2020-07-24 DIAGNOSIS — W19XXXD Unspecified fall, subsequent encounter: Secondary | ICD-10-CM | POA: Diagnosis not present

## 2020-07-24 DIAGNOSIS — K219 Gastro-esophageal reflux disease without esophagitis: Secondary | ICD-10-CM | POA: Diagnosis not present

## 2020-07-24 DIAGNOSIS — I714 Abdominal aortic aneurysm, without rupture: Secondary | ICD-10-CM | POA: Diagnosis not present

## 2020-07-24 DIAGNOSIS — D631 Anemia in chronic kidney disease: Secondary | ICD-10-CM | POA: Diagnosis not present

## 2020-07-24 DIAGNOSIS — D509 Iron deficiency anemia, unspecified: Secondary | ICD-10-CM | POA: Diagnosis not present

## 2020-07-24 DIAGNOSIS — G301 Alzheimer's disease with late onset: Secondary | ICD-10-CM | POA: Diagnosis not present

## 2020-07-24 DIAGNOSIS — Z950 Presence of cardiac pacemaker: Secondary | ICD-10-CM | POA: Diagnosis not present

## 2020-07-26 DIAGNOSIS — I129 Hypertensive chronic kidney disease with stage 1 through stage 4 chronic kidney disease, or unspecified chronic kidney disease: Secondary | ICD-10-CM | POA: Diagnosis not present

## 2020-07-26 DIAGNOSIS — D631 Anemia in chronic kidney disease: Secondary | ICD-10-CM | POA: Diagnosis not present

## 2020-07-26 DIAGNOSIS — G301 Alzheimer's disease with late onset: Secondary | ICD-10-CM | POA: Diagnosis not present

## 2020-07-26 DIAGNOSIS — I2510 Atherosclerotic heart disease of native coronary artery without angina pectoris: Secondary | ICD-10-CM | POA: Diagnosis not present

## 2020-07-26 DIAGNOSIS — S72001D Fracture of unspecified part of neck of right femur, subsequent encounter for closed fracture with routine healing: Secondary | ICD-10-CM | POA: Diagnosis not present

## 2020-07-26 DIAGNOSIS — N184 Chronic kidney disease, stage 4 (severe): Secondary | ICD-10-CM | POA: Diagnosis not present

## 2020-07-28 DIAGNOSIS — E7800 Pure hypercholesterolemia, unspecified: Secondary | ICD-10-CM | POA: Diagnosis not present

## 2020-07-28 DIAGNOSIS — I10 Essential (primary) hypertension: Secondary | ICD-10-CM | POA: Diagnosis not present

## 2020-07-28 DIAGNOSIS — G301 Alzheimer's disease with late onset: Secondary | ICD-10-CM | POA: Diagnosis not present

## 2020-07-28 DIAGNOSIS — K219 Gastro-esophageal reflux disease without esophagitis: Secondary | ICD-10-CM | POA: Diagnosis not present

## 2020-07-28 DIAGNOSIS — N1832 Chronic kidney disease, stage 3b: Secondary | ICD-10-CM | POA: Diagnosis not present

## 2020-08-03 DIAGNOSIS — K219 Gastro-esophageal reflux disease without esophagitis: Secondary | ICD-10-CM | POA: Diagnosis not present

## 2020-08-03 DIAGNOSIS — N1832 Chronic kidney disease, stage 3b: Secondary | ICD-10-CM | POA: Diagnosis not present

## 2020-08-03 DIAGNOSIS — E7800 Pure hypercholesterolemia, unspecified: Secondary | ICD-10-CM | POA: Diagnosis not present

## 2020-08-03 DIAGNOSIS — G301 Alzheimer's disease with late onset: Secondary | ICD-10-CM | POA: Diagnosis not present

## 2020-08-03 DIAGNOSIS — I10 Essential (primary) hypertension: Secondary | ICD-10-CM | POA: Diagnosis not present

## 2020-08-04 DIAGNOSIS — G301 Alzheimer's disease with late onset: Secondary | ICD-10-CM | POA: Diagnosis not present

## 2020-08-04 DIAGNOSIS — I129 Hypertensive chronic kidney disease with stage 1 through stage 4 chronic kidney disease, or unspecified chronic kidney disease: Secondary | ICD-10-CM | POA: Diagnosis not present

## 2020-08-04 DIAGNOSIS — D631 Anemia in chronic kidney disease: Secondary | ICD-10-CM | POA: Diagnosis not present

## 2020-08-04 DIAGNOSIS — N184 Chronic kidney disease, stage 4 (severe): Secondary | ICD-10-CM | POA: Diagnosis not present

## 2020-08-04 DIAGNOSIS — S72001D Fracture of unspecified part of neck of right femur, subsequent encounter for closed fracture with routine healing: Secondary | ICD-10-CM | POA: Diagnosis not present

## 2020-08-04 DIAGNOSIS — I2510 Atherosclerotic heart disease of native coronary artery without angina pectoris: Secondary | ICD-10-CM | POA: Diagnosis not present

## 2020-08-05 DIAGNOSIS — G301 Alzheimer's disease with late onset: Secondary | ICD-10-CM | POA: Diagnosis not present

## 2020-08-05 DIAGNOSIS — I129 Hypertensive chronic kidney disease with stage 1 through stage 4 chronic kidney disease, or unspecified chronic kidney disease: Secondary | ICD-10-CM | POA: Diagnosis not present

## 2020-08-05 DIAGNOSIS — N184 Chronic kidney disease, stage 4 (severe): Secondary | ICD-10-CM | POA: Diagnosis not present

## 2020-08-05 DIAGNOSIS — I2510 Atherosclerotic heart disease of native coronary artery without angina pectoris: Secondary | ICD-10-CM | POA: Diagnosis not present

## 2020-08-05 DIAGNOSIS — S72001D Fracture of unspecified part of neck of right femur, subsequent encounter for closed fracture with routine healing: Secondary | ICD-10-CM | POA: Diagnosis not present

## 2020-08-05 DIAGNOSIS — D631 Anemia in chronic kidney disease: Secondary | ICD-10-CM | POA: Diagnosis not present

## 2020-08-09 DIAGNOSIS — G301 Alzheimer's disease with late onset: Secondary | ICD-10-CM | POA: Diagnosis not present

## 2020-08-09 DIAGNOSIS — D631 Anemia in chronic kidney disease: Secondary | ICD-10-CM | POA: Diagnosis not present

## 2020-08-09 DIAGNOSIS — S72001D Fracture of unspecified part of neck of right femur, subsequent encounter for closed fracture with routine healing: Secondary | ICD-10-CM | POA: Diagnosis not present

## 2020-08-09 DIAGNOSIS — I129 Hypertensive chronic kidney disease with stage 1 through stage 4 chronic kidney disease, or unspecified chronic kidney disease: Secondary | ICD-10-CM | POA: Diagnosis not present

## 2020-08-09 DIAGNOSIS — N184 Chronic kidney disease, stage 4 (severe): Secondary | ICD-10-CM | POA: Diagnosis not present

## 2020-08-09 DIAGNOSIS — I2510 Atherosclerotic heart disease of native coronary artery without angina pectoris: Secondary | ICD-10-CM | POA: Diagnosis not present

## 2020-08-10 ENCOUNTER — Other Ambulatory Visit: Payer: Self-pay

## 2020-08-10 ENCOUNTER — Ambulatory Visit (INDEPENDENT_AMBULATORY_CARE_PROVIDER_SITE_OTHER): Payer: Medicare Other | Admitting: Podiatry

## 2020-08-10 ENCOUNTER — Encounter: Payer: Self-pay | Admitting: Podiatry

## 2020-08-10 DIAGNOSIS — M79675 Pain in left toe(s): Secondary | ICD-10-CM | POA: Diagnosis not present

## 2020-08-10 DIAGNOSIS — B351 Tinea unguium: Secondary | ICD-10-CM | POA: Diagnosis not present

## 2020-08-10 DIAGNOSIS — N1832 Chronic kidney disease, stage 3b: Secondary | ICD-10-CM | POA: Diagnosis not present

## 2020-08-10 DIAGNOSIS — M79674 Pain in right toe(s): Secondary | ICD-10-CM | POA: Diagnosis not present

## 2020-08-10 NOTE — Progress Notes (Signed)
This patient returns to the office for evaluation and treatment of long thick painful nails .  This patient is unable to trim his own nails since the patient cannot reach the feet.  Patient says the nails are painful walking and wearing his shoes.  He returns for preventive foot care services. He presents to the office with wife.  General Appearance  Alert, conversant and in no acute stress.  Vascular  Dorsalis pedis and posterior tibial  pulses are not   palpable  bilaterally.  Capillary return is within normal limits  bilaterally. Temperature is within normal limits  Bilaterally. Absent hair.  Neurologic  Senn-Weinstein monofilament wire test within normal limits  bilaterally. Muscle power within normal limits bilaterally.  Nails Thick disfigured discolored nails with subungual debris  from hallux to fifth toes bilaterally. No evidence of bacterial infection or drainage bilaterally.  Orthopedic  No limitations of motion  feet .  No crepitus or effusions noted.  No bony pathology or digital deformities noted.  Skin  normotropic skin with no porokeratosis noted bilaterally.  No signs of infections or ulcers noted.     Onychomycosis  Pain in toes right foot  Pain in toes left foot  Debridement  of nails  1-5  B/L with a nail nipper.  Nails were then filed using a dremel tool with no incidents.    RTC  10 weeks   Gardiner Barefoot DPM

## 2020-08-12 DIAGNOSIS — D631 Anemia in chronic kidney disease: Secondary | ICD-10-CM | POA: Diagnosis not present

## 2020-08-12 DIAGNOSIS — I2510 Atherosclerotic heart disease of native coronary artery without angina pectoris: Secondary | ICD-10-CM | POA: Diagnosis not present

## 2020-08-12 DIAGNOSIS — G301 Alzheimer's disease with late onset: Secondary | ICD-10-CM | POA: Diagnosis not present

## 2020-08-12 DIAGNOSIS — I129 Hypertensive chronic kidney disease with stage 1 through stage 4 chronic kidney disease, or unspecified chronic kidney disease: Secondary | ICD-10-CM | POA: Diagnosis not present

## 2020-08-12 DIAGNOSIS — S72001D Fracture of unspecified part of neck of right femur, subsequent encounter for closed fracture with routine healing: Secondary | ICD-10-CM | POA: Diagnosis not present

## 2020-08-12 DIAGNOSIS — N184 Chronic kidney disease, stage 4 (severe): Secondary | ICD-10-CM | POA: Diagnosis not present

## 2020-08-17 DIAGNOSIS — N184 Chronic kidney disease, stage 4 (severe): Secondary | ICD-10-CM | POA: Diagnosis not present

## 2020-08-17 DIAGNOSIS — I2510 Atherosclerotic heart disease of native coronary artery without angina pectoris: Secondary | ICD-10-CM | POA: Diagnosis not present

## 2020-08-17 DIAGNOSIS — D631 Anemia in chronic kidney disease: Secondary | ICD-10-CM | POA: Diagnosis not present

## 2020-08-17 DIAGNOSIS — I129 Hypertensive chronic kidney disease with stage 1 through stage 4 chronic kidney disease, or unspecified chronic kidney disease: Secondary | ICD-10-CM | POA: Diagnosis not present

## 2020-08-17 DIAGNOSIS — G301 Alzheimer's disease with late onset: Secondary | ICD-10-CM | POA: Diagnosis not present

## 2020-08-17 DIAGNOSIS — S72001D Fracture of unspecified part of neck of right femur, subsequent encounter for closed fracture with routine healing: Secondary | ICD-10-CM | POA: Diagnosis not present

## 2020-08-19 DIAGNOSIS — G301 Alzheimer's disease with late onset: Secondary | ICD-10-CM | POA: Diagnosis not present

## 2020-08-19 DIAGNOSIS — S72001D Fracture of unspecified part of neck of right femur, subsequent encounter for closed fracture with routine healing: Secondary | ICD-10-CM | POA: Diagnosis not present

## 2020-08-19 DIAGNOSIS — I129 Hypertensive chronic kidney disease with stage 1 through stage 4 chronic kidney disease, or unspecified chronic kidney disease: Secondary | ICD-10-CM | POA: Diagnosis not present

## 2020-08-19 DIAGNOSIS — D631 Anemia in chronic kidney disease: Secondary | ICD-10-CM | POA: Diagnosis not present

## 2020-08-19 DIAGNOSIS — I2510 Atherosclerotic heart disease of native coronary artery without angina pectoris: Secondary | ICD-10-CM | POA: Diagnosis not present

## 2020-08-19 DIAGNOSIS — N184 Chronic kidney disease, stage 4 (severe): Secondary | ICD-10-CM | POA: Diagnosis not present

## 2020-08-23 DIAGNOSIS — D631 Anemia in chronic kidney disease: Secondary | ICD-10-CM | POA: Diagnosis not present

## 2020-08-23 DIAGNOSIS — G301 Alzheimer's disease with late onset: Secondary | ICD-10-CM | POA: Diagnosis not present

## 2020-08-23 DIAGNOSIS — Z7982 Long term (current) use of aspirin: Secondary | ICD-10-CM | POA: Diagnosis not present

## 2020-08-23 DIAGNOSIS — F0281 Dementia in other diseases classified elsewhere with behavioral disturbance: Secondary | ICD-10-CM | POA: Diagnosis not present

## 2020-08-23 DIAGNOSIS — N184 Chronic kidney disease, stage 4 (severe): Secondary | ICD-10-CM | POA: Diagnosis not present

## 2020-08-23 DIAGNOSIS — I4821 Permanent atrial fibrillation: Secondary | ICD-10-CM | POA: Diagnosis not present

## 2020-08-23 DIAGNOSIS — E785 Hyperlipidemia, unspecified: Secondary | ICD-10-CM | POA: Diagnosis not present

## 2020-08-23 DIAGNOSIS — Z951 Presence of aortocoronary bypass graft: Secondary | ICD-10-CM | POA: Diagnosis not present

## 2020-08-23 DIAGNOSIS — R634 Abnormal weight loss: Secondary | ICD-10-CM | POA: Diagnosis not present

## 2020-08-23 DIAGNOSIS — I129 Hypertensive chronic kidney disease with stage 1 through stage 4 chronic kidney disease, or unspecified chronic kidney disease: Secondary | ICD-10-CM | POA: Diagnosis not present

## 2020-08-23 DIAGNOSIS — I714 Abdominal aortic aneurysm, without rupture: Secondary | ICD-10-CM | POA: Diagnosis not present

## 2020-08-23 DIAGNOSIS — D509 Iron deficiency anemia, unspecified: Secondary | ICD-10-CM | POA: Diagnosis not present

## 2020-08-23 DIAGNOSIS — S72001D Fracture of unspecified part of neck of right femur, subsequent encounter for closed fracture with routine healing: Secondary | ICD-10-CM | POA: Diagnosis not present

## 2020-08-23 DIAGNOSIS — W19XXXD Unspecified fall, subsequent encounter: Secondary | ICD-10-CM | POA: Diagnosis not present

## 2020-08-23 DIAGNOSIS — I2510 Atherosclerotic heart disease of native coronary artery without angina pectoris: Secondary | ICD-10-CM | POA: Diagnosis not present

## 2020-08-23 DIAGNOSIS — Z950 Presence of cardiac pacemaker: Secondary | ICD-10-CM | POA: Diagnosis not present

## 2020-08-23 DIAGNOSIS — F32A Depression, unspecified: Secondary | ICD-10-CM | POA: Diagnosis not present

## 2020-08-23 DIAGNOSIS — K219 Gastro-esophageal reflux disease without esophagitis: Secondary | ICD-10-CM | POA: Diagnosis not present

## 2020-08-23 DIAGNOSIS — Z9181 History of falling: Secondary | ICD-10-CM | POA: Diagnosis not present

## 2020-08-23 DIAGNOSIS — I442 Atrioventricular block, complete: Secondary | ICD-10-CM | POA: Diagnosis not present

## 2020-08-23 DIAGNOSIS — Z9049 Acquired absence of other specified parts of digestive tract: Secondary | ICD-10-CM | POA: Diagnosis not present

## 2020-08-23 DIAGNOSIS — K5790 Diverticulosis of intestine, part unspecified, without perforation or abscess without bleeding: Secondary | ICD-10-CM | POA: Diagnosis not present

## 2020-08-26 DIAGNOSIS — G301 Alzheimer's disease with late onset: Secondary | ICD-10-CM | POA: Diagnosis not present

## 2020-08-26 DIAGNOSIS — N184 Chronic kidney disease, stage 4 (severe): Secondary | ICD-10-CM | POA: Diagnosis not present

## 2020-08-26 DIAGNOSIS — D631 Anemia in chronic kidney disease: Secondary | ICD-10-CM | POA: Diagnosis not present

## 2020-08-26 DIAGNOSIS — I129 Hypertensive chronic kidney disease with stage 1 through stage 4 chronic kidney disease, or unspecified chronic kidney disease: Secondary | ICD-10-CM | POA: Diagnosis not present

## 2020-08-26 DIAGNOSIS — S72001D Fracture of unspecified part of neck of right femur, subsequent encounter for closed fracture with routine healing: Secondary | ICD-10-CM | POA: Diagnosis not present

## 2020-08-26 DIAGNOSIS — I2510 Atherosclerotic heart disease of native coronary artery without angina pectoris: Secondary | ICD-10-CM | POA: Diagnosis not present

## 2020-08-29 ENCOUNTER — Encounter: Payer: Self-pay | Admitting: Neurology

## 2020-08-29 ENCOUNTER — Other Ambulatory Visit: Payer: Self-pay

## 2020-08-29 ENCOUNTER — Encounter: Payer: Medicare Other | Admitting: Cardiovascular Disease

## 2020-08-29 ENCOUNTER — Ambulatory Visit (INDEPENDENT_AMBULATORY_CARE_PROVIDER_SITE_OTHER): Payer: Medicare Other | Admitting: Neurology

## 2020-08-29 DIAGNOSIS — G301 Alzheimer's disease with late onset: Secondary | ICD-10-CM | POA: Diagnosis not present

## 2020-08-29 DIAGNOSIS — F0280 Dementia in other diseases classified elsewhere without behavioral disturbance: Secondary | ICD-10-CM

## 2020-08-29 DIAGNOSIS — S72001G Fracture of unspecified part of neck of right femur, subsequent encounter for closed fracture with delayed healing: Secondary | ICD-10-CM

## 2020-08-29 DIAGNOSIS — F0390 Unspecified dementia without behavioral disturbance: Secondary | ICD-10-CM | POA: Diagnosis not present

## 2020-08-29 DIAGNOSIS — S72009A Fracture of unspecified part of neck of unspecified femur, initial encounter for closed fracture: Secondary | ICD-10-CM | POA: Insufficient documentation

## 2020-08-29 MED ORDER — DONEPEZIL HCL 5 MG PO TABS: 5.0000 mg | ORAL_TABLET | Freq: Two times a day (BID) | ORAL | 3 refills | Status: DC

## 2020-08-29 MED ORDER — SERTRALINE HCL 25 MG PO TABS
25.0000 mg | ORAL_TABLET | Freq: Every day | ORAL | 1 refills | Status: DC
Start: 2020-08-29 — End: 2021-01-29

## 2020-08-29 NOTE — Patient Instructions (Signed)
Fall Prevention in the Home, Adult Falls can cause injuries and can happen to people of all ages. There are many things you can do to make your home safe and to help prevent falls. Ask for help when making these changes. What actions can I take to prevent falls? General Instructions  Use good lighting in all rooms. Replace any light bulbs that burn out.  Turn on the lights in dark areas. Use night-lights.  Keep items that you use often in easy-to-reach places. Lower the shelves around your home if needed.  Set up your furniture so you have a clear path. Avoid moving your furniture around.  Do not have throw rugs or other things on the floor that can make you trip.  Avoid walking on wet floors.  If any of your floors are uneven, fix them.  Add color or contrast paint or tape to clearly mark and help you see: ? Grab bars or handrails. ? First and last steps of staircases. ? Where the edge of each step is.  If you use a stepladder: ? Make sure that it is fully opened. Do not climb a closed stepladder. ? Make sure the sides of the stepladder are locked in place. ? Ask someone to hold the stepladder while you use it.  Know where your pets are when moving through your home. What can I do in the bathroom?  Keep the floor dry. Clean up any water on the floor right away.  Remove soap buildup in the tub or shower.  Use nonskid mats or decals on the floor of the tub or shower.  Attach bath mats securely with double-sided, nonslip rug tape.  If you need to sit down in the shower, use a plastic, nonslip stool.  Install grab bars by the toilet and in the tub and shower. Do not use towel bars as grab bars.      What can I do in the bedroom?  Make sure that you have a light by your bed that is easy to reach.  Do not use any sheets or blankets for your bed that hang to the floor.  Have a firm chair with side arms that you can use for support when you get dressed. What can I do in  the kitchen?  Clean up any spills right away.  If you need to reach something above you, use a step stool with a grab bar.  Keep electrical cords out of the way.  Do not use floor polish or wax that makes floors slippery. What can I do with my stairs?  Do not leave any items on the stairs.  Make sure that you have a light switch at the top and the bottom of the stairs.  Make sure that there are handrails on both sides of the stairs. Fix handrails that are broken or loose.  Install nonslip stair treads on all your stairs.  Avoid having throw rugs at the top or bottom of the stairs.  Choose a carpet that does not hide the edge of the steps on the stairs.  Check carpeting to make sure that it is firmly attached to the stairs. Fix carpet that is loose or worn. What can I do on the outside of my home?  Use bright outdoor lighting.  Fix the edges of walkways and driveways and fix any cracks.  Remove anything that might make you trip as you walk through a door, such as a raised step or threshold.  Trim any   bushes or trees on paths to your home.  Check to see if handrails are loose or broken and that both sides of all steps have handrails.  Install guardrails along the edges of any raised decks and porches.  Clear paths of anything that can make you trip, such as tools or rocks.  Have leaves, snow, or ice cleared regularly.  Use sand or salt on paths during winter.  Clean up any spills in your garage right away. This includes grease or oil spills. What other actions can I take?  Wear shoes that: ? Have a low heel. Do not wear high heels. ? Have rubber bottoms. ? Feel good on your feet and fit well. ? Are closed at the toe. Do not wear open-toe sandals.  Use tools that help you move around if needed. These include: ? Canes. ? Walkers. ? Scooters. ? Crutches.  Review your medicines with your doctor. Some medicines can make you feel dizzy. This can increase your chance  of falling. Ask your doctor what else you can do to help prevent falls. Where to find more information  Centers for Disease Control and Prevention, [employer]: www.cdc.gov  National Institute on Aging: www.nia.nih.gov Contact a doctor if:  You are afraid of falling at home.  You feel weak, drowsy, or dizzy at home.  You fall at home. Summary  There are many simple things that you can do to make your home safe and to help prevent falls.  Ways to make your home safe include removing things that can make you trip and installing grab bars in the bathroom.  Ask for help when making these changes in your home. This information is not intended to replace advice given to you by your health care provider. Make sure you discuss any questions you have with your health care provider. Document Revised: 01/20/2020 Document Reviewed: 01/20/2020 Elsevier Patient Education  2021 [employer]  

## 2020-08-29 NOTE — Progress Notes (Signed)
[name]: [name] [name] DOB: 01/30/1926  REASON FOR VISIT: follow up HISTORY FROM: [name]  Chief Complaint  [name] presents with  . Follow-up    Pt with wife, rm 47. Last 6 months he broke hip and spent couple months completing rehab. He is back at home. This has caused a decline in mental status. Last visit July MMSE 21/30     HISTORY OF PRESENT ILLNESS: Interval history from 08/29/20: [name] [name] is a 87 y.o. male here today for follow up for dementia, he had a severe fall in September 2021, and has been using a walker since, his memory has declined and he is much frailer appearing. [name] [name] had to be hospitalized for 3 days opinion was used to fix his femur fracture and he then was discharged to  rehab inpatient- at [facility] home. He is cold and tired here today, ow volume voice, pale but well groomed.    HISTORY: (copied from my note on 07/06/2019) He continues to do well. He is taking Aricept 5mg  twice daily. He is able to perform ADL's mostly independently. His wife doses medications. He continues to be as active as possible. Balance is stable. He did have one fall since being seen. His wife reports placing some cushions by his bed and he tripped over them. No injuries. He does use 4 prong cane for stability.   HISTORY: (copied from my note on 03/25/2019)  [name] [name] a 87 y.o.malehere today for follow up for dementia. He continues Aricept 5mg  twice daily. He is tolerating medication well with no obvious adverse effects. Never taken Namenda. Family not certain they wish to add medications at this time. They feel that memory is stable. He does not drive. He sleeps in a bedroom on main floor. He has to take a shower upstairs. He has a shower chair. He does have assistance getting in/out but can shower independently. He is able to dress and feed himself. He has gained some weight. Appetite is better.   GI bleed in 06/2018. Anticoagulation was stopped. Cardiology  follow up in 01/2019 stable with follow up planned in October. Asa 81mg  restarted. No bleeding or excessive briusing. He had a fall on 12/25/2018. He reports walking on sidewalk and lost balance. No LOC. He had IM nailing on 6/26. He was last seen by ortho on 9/18 and notes show stable fixation and neurovascularly intact. He is scheduled to return in 6 weeks. He continues to work with PT/OT. He was using a walker but recently switched to a 4 point cane. No falls.   HISTORY: (copied fromDr Dohmeier'snote on 12/16/2018)  Chronic atrial fibrillation,status post Gi bleed, heart block, and dementia. [name] [name] appears frail and sleepy- he was able to report stories from his youth in [place].  He has neither fainted not fallen in [name] last 4 month   HISTORY FROM: [name] [name] and his wife are on [name] video.  RV 07-16-2018:  I have [name] pleasure of seeing [name] [name] today here in [name] presence of his spouse, he is a 87 year old gentleman and his next Thursday will be on 21 February. [name] [name] was hospitalized after a fall in [name] night of 06-05-2018,he had bled- had not passed out. Wife and son couldn't raise him up, [name] EMS was called. He had a GI-bleed related to diverticulitis and was on coumadin- lost 11 pounds, anemic, pale. He was in [name] Ed again after 2 days admission for another admission of 5 days duration.  Was very sick. He has forgotten all of it. GI MD is Dr Cristina Gong.  [name]. [name] is followed here on regular intervals for cognitive testing and his Mini-Mental status examination in 2018 had revealed 20 out of 30 points, today he scored 21 out of 30 points. He is not depressed or at least does not endorse any of these depression related symptoms on a questionnaire he is not excessively fatigued but he is certainly getting frailer. He has a very different posture now and is more stooped, his neck circumference is 16-1/4 inches and is rather slim for a gentleman of his height 5 foot 10. His  body mass index is 20.4.   HISTORY OF PRESENT ILLNESS: Today 07/09/2016 and is Mini-Mental Status Examination revealed 20 out of 30 points today [name] last evaluation was 25 out of 30. [name] [name] has been stable since summer 2016 with points between 25 and 24 out of 30. [name]. [name], 87 year old male returns for follow-up. He has history of Alzheimer's dementia. He continues to be able to complete his ADLs independently, he does not have a driver's license. He is currently on Aricept 5 mg twice a day for GI Reasons. His memory score is stable on Aricept alone. He gets his medications from [name] [place]. He does not exercise his memory with puzzles or crosswords etc. He gets no regular exercise.   Had one recent fall, no injuries resulted. His wife is here confirming his memory is more impaired since last year- just last week they went to [name] cinema together and watched a movie about Laqueta Linden, as a left [name] cinema [name] [name] asked what [name] movie was about he had forgotten!. He could also not recall [name] movie when his wife explained what they had just watched- he believes he was asleep- his wife denied this. His gait has deteriorated.  [name] [name] grew up in [place] and in [place], [place]. He has a lot of interesting stories about his education at [employer]. in [place] in [name] 1940's.   I have [name] pleasure of seeing [name] [name] today. 07-15-2016, We are talking , once gain, about his 6 years in [place] and his long family history with [name] [place] . His wife is concerned about his short term memory , he is not. MMSE is stable. He will be 89 next month.  No REM behavior. Had 2 falls I [name] last 12 month- one while carrying a 25 pound bag of ice melt, refusing any help. One other near fall let to a visit at [facility] ED, was related to arrhythmia. They called Dr. Benn Moulder and were told to come to [name] ED- no arrhythmia proven.  [name]  MMSE - Mini Mental State Exam 07/16/2018 07/15/2017 07/09/2016 01/04/2016 07/05/2015   Orientation to time 2 4 1 2 3   Orientation to Place 4 4 4 3 3   Registration 3 3 3 3 3   Attention/ Calculation 3 2 4 4 4   Attention/Calculation-comments word not numbers - - - -  Recall 1 1 0 1 1  Language- name 2 objects 2 2 2 2 2   Language- repeat 1 1 1 1 1   Language- follow 3 step command 3 3 3 3 3   Language- read & follow direction 1 1 1 1 1   Write a sentence 1 0 1 1 1   Copy design 0 1 0 1 1  Total score 21 22 20 22 23     HISTORY: [name]. [name] is a 87 year old male with a history of  dementia. He returns today for follow-up. He is currently taking Aricept 10 mg and tolerating it well. [name] [name] states that his memory is "good." his wife reports that she feels that it has remained [name] same. [name] [name] is able to complete all ADLs independently.  [name] [name] has a driver's licenses but no longer drives. His wife does all [name] driving.  He denies having to give up anything due to his memory. [name] wife states that [name] [name]'s behavior has not been an issue.   On 7-27-16the [name] scored 25 out of 30 points and [name] Mini-Mental Status Examination for [name] last 12 months he has been very stable on Aricept alone. [name], his primary care physician, asked if Namenda could be added and indeed it could be. I am often combined [name] 2 medications Aricept and Namenda and they can be taken in [name] generic form or even in a combination product as one pill called Namziric. I will start [name] [name] on a starter pack for once a day namenda.  He is interested in stopping protonix, and he may get by with Tumms at night. A trial of 4-6 weeks would suffice to show if he is still having reflux.    REVIEW OF SYSTEMS: Out of a complete 14 system review of symptoms, [name] [name] complains only of [name] following symptoms, imbalance, memory loss, and all other reviewed systems are negative.  ALLERGIES: Allergies  Allergen Reactions  . Amiodarone Other (See Comments)    Had a severe lung infection in  2003  . Antihistamines, Diphenhydramine-Type Other (See Comments)    Jittery  . Lovastatin     Other reaction(s): NAUSEA,VOMITING  . Other     Other reaction(s): NAUSEA,VOMITING Other reaction(s): NAUSEA,VOMITING    HOME MEDICATIONS: Outpatient Medications Prior to Visit  Medication Sig Dispense Refill  . acetaminophen (TYLENOL) 500 MG tablet Take 1 tablet (500 mg total) by mouth every 6 (six) hours as needed for mild pain. 30 tablet 0  . aspirin EC 81 MG tablet Take 81 mg by mouth daily. Swallow whole.    [name] atorvastatin (LIPITOR) 40 MG tablet Take 40 mg by mouth every evening.     . Cholecalciferol (VITAMIN D) 2000 UNITS CAPS Take 2,000 Units by mouth at bedtime.     . ferrous sulfate 325 (65 FE) MG EC tablet Take 325 mg by mouth daily.     . finasteride (PROSCAR) 5 MG tablet Take 5 mg by mouth every evening.     . lactose free nutrition (BOOST) LIQD Take 237 mLs by mouth daily. Add chocolate ice cream to make a milkshake    . omeprazole (PRILOSEC) 20 MG capsule Take 20 mg by mouth daily.    . polyethylene glycol (MIRALAX / GLYCOLAX) 17 g packet Take 17 g by mouth daily as needed for mild constipation. 14 each 0  . sertraline (ZOLOFT) 25 MG tablet Take 25 mg by mouth daily.    [name] zolpidem (AMBIEN) 10 MG tablet Take 0.5 tablets by mouth at bedtime.    . bisacodyl (DULCOLAX) 5 MG EC tablet Take 1 tablet (5 mg total) by mouth daily as needed for moderate constipation. 30 tablet 0  . calcium-vitamin D (OSCAL WITH D) 500-200 MG-UNIT TABS tablet Take 1 tablet by mouth 2 (two) times daily.    [name] docusate sodium (COLACE) 100 MG capsule Take 1 capsule (100 mg total) by mouth 2 (two) times daily. 10 capsule 0  . donepezil (ARICEPT) 5 MG tablet Take 1  tablet (5 mg total) by mouth 2 (two) times daily. 180 tablet 3  . mirtazapine (REMERON) 15 MG tablet Take 7.5 mg by mouth daily.    . Multiple Vitamin (MULTIVITAMIN WITH MINERALS) TABS tablet Take 1 tablet by mouth daily.      No  facility-administered medications prior to visit.    PAST MEDICAL HISTORY: Past Medical History:  Diagnosis Date  . Atrial fibrillation, permanent ([place])    no longer on AC due to GI bleed  . CAD (coronary artery disease)   . Dementia ([place])   . Diverticulosis    diverticular bleed 2011  . High cholesterol   . HTN (hypertension)    has improved over time, no rx as of 2019  . Lower GI bleed     PAST SURGICAL HISTORY: Past Surgical History:  Procedure Laterality Date  . angiolplasty    . APPENDECTOMY    . INSERT / REPLACE / REMOVE PACEMAKER    . INTRAMEDULLARY (IM) NAIL INTERTROCHANTERIC Left 12/26/2018   Procedure: INTRAMEDULLARY (IM) NAIL INTERTROCHANTRIC;  Surgeon: Leandrew Koyanagi, MD;  Location: [facility];  Service: Orthopedics;  Laterality: Left;  . INTRAMEDULLARY (IM) NAIL INTERTROCHANTERIC Right 03/22/2020   Procedure: INTRAMEDULLARY (IM) NAIL INTERTROCHANTRIC;  Surgeon: Newt Minion, MD;  Location: [facility];  Service: Orthopedics;  Laterality: Right;  . KNEE SURGERY    . PILONIDAL CYST EXCISION    . rotator cuff surgery  1985  . TONSILLECTOMY    . triple heart bypass      FAMILY HISTORY: Family History  Problem Relation Age of Onset  . Stroke Father   . Cervical cancer Other        siblings  . COPD Sister   . Dementia Brother     SOCIAL HISTORY: Social History   Socioeconomic History  . Marital status: Married    Spouse name: [name]  . Number of children: 6  . Years of education: college  . Highest education level: Not on file  Occupational History  . Occupation: retired  Tobacco Use  . Smoking status: Former Smoker    Quit date: 07/02/1974    Years since quitting: 46.1  . Smokeless tobacco: Never Used  Vaping Use  . Vaping Use: Never used  Substance and Sexual Activity  . Alcohol use: Not Currently    Comment: last used in dec 2019  . Drug use: No  . Sexual activity: Not on file  Other Topics Concern  . Not on file  Social History Narrative    [name] is married [name]) and lives at home with his wife.   [name] has a college education.   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   [name] does not drink any caffeine.   [name] has six children.   Social Determinants of Health   Financial Resource Strain: Not on file  Food Insecurity: Not on file  Transportation Needs: Not on file  Physical Activity: Not on file  Stress: Not on file  Social Connections: Not on file  Intimate Partner Violence: Not on file      PHYSICAL EXAM  There were no vitals filed for this visit. There is no height or weight on file to calculate BMI.  Generalized: Well developed, in no acute distress  Cardiology: normal rate and rhythm, no murmur noted Respiratory: clear to auscultation bilaterally  Neurological examination :   MMSE - Mini Mental State Exam 08/29/2020 01/19/2020 07/06/2019 03/25/2019 07/16/2018 07/15/2017 07/09/2016  Not completed: - - - (  No Data) - - -  Orientation to time 1 0 0 2 2 4 1   Orientation to Place 3 3 5 3 4 4 4   Registration 3 3 3 3 3 3 3   Attention/ Calculation 2 5 2 2 3 2 4   Attention/Calculation-comments -word spelling , not serial 7th - - - word not numbers - -  Recall 0 0 0 0 1 1 0  Language- name 2 objects 2 2 2 2 2 2 2   Language- repeat 1 1 0 1 1 1 1   Language- follow 3 step command 3 3 3 2 3 3 3   Language- follow 3 step command-comments - - - he didnt place [name] paper on his lap or [name] desk - - -  Language- read & follow direction 1 1 1 1 1 1 1   Write a sentence 1 1 1 1 1  0 1  Copy design 0 1 1 1  0 1 0  Copy design-comments - - named 6 animals - - - -  Total score 19 20 18 18 21 22 20      Mentation: Alert oriented to time, place, history taking. Follows all commands speech and language fluent Cranial nerve: Pupils were equal round reactive to light. Extraocular movements were full, visual field were full  Motor: [name] motor testing reveals 5 over 5 strength of all 4 extremities. Good symmetric motor  tone is noted throughout.  Sensory: Sensory testing is intact to soft touch on all 4 extremities. No evidence of extinction is noted.   Gait and station: Gait is short, walks with four prong cane, stooped posture  DIAGNOSTIC DATA (LABS, IMAGING, TESTING) - I reviewed [name] records, labs, notes, testing and imaging myself where available.   Lab Results  Component Value Date   WBC 5.4 05/18/2020   HGB 8.6 (A) 05/18/2020   HCT 27 (A) 05/18/2020   MCV 99.2 03/24/2020   PLT 291 05/18/2020      Component Value Date/Time   NA 140 05/18/2020 0000   K 4.6 05/18/2020 0000   CL 106 05/18/2020 0000   CO2 27 (A) 05/18/2020 0000   GLUCOSE 93 03/24/2020 0456   BUN 26 (A) 05/18/2020 0000   CREATININE 1.3 05/18/2020 0000   CREATININE 1.71 (H) 03/24/2020 0456   CALCIUM 8.4 (A) 05/18/2020 0000   PROT 7.6 12/25/2018 2106   ALBUMIN 2.6 (A) 05/18/2020 0000   AST 22 05/18/2020 0000   ALT 8 (A) 05/18/2020 0000   ALKPHOS 92 05/18/2020 0000   BILITOT 0.2 (L) 12/25/2018 2106   GFRNONAA 47 05/18/2020 0000   GFRAA 54 05/18/2020 0000   No results found for: CHOL, HDL, LDLCALC, LDLDIRECT, TRIG, CHOLHDL Lab Results  Component Value Date   HGBA1C  12/13/2008    5.5 (NOTE) [name] ADA recommends [name] following therapeutic goal for glycemic control related to Hgb A1c measurement: Goal of therapy: <6.5 Hgb A1c  Reference: American Diabetes Association: Clinical Practice Recommendations 2010, Diabetes Care, 2010, 33: (Suppl  1).   No results found for: VITAMINB12 Lab Results  Component Value Date   TSH 1.62 04/22/2020       ASSESSMENT AND PLAN 87 y.o. year old male  has a past medical history of Atrial fibrillation, permanent ([place]), CAD (coronary artery disease), Dementia ([place]), Diverticulosis, High cholesterol, HTN (hypertension), and Lower GI bleed. here with    [name] [name] is doing well in light of his fall, hip fracture and protracted rehab, moblity limitation. He uses a walker , no longr  a cane as  he did in my last visit he fell in his bathroom. h now has a bar and raised toilet seat to avoid falls.    He will continue Aricept 5mg  twice daily.  We have discussed adding Namenda but he is not interested at this time but may consider in [name] future. Information provided in AVS. He was encouraged to stay active.  Well balanced diet and adequate hydration encouraged.  He will continue close follow up with PCP.  Memory compensations strategies reviewed. He will follow up in August with Np or me , 6 months with MMSE. [name]       I spent 30 minutes with [name] [name]. 50% of this time was spent counseling and educating [name] on plan of care and medications.    Asencion Partridge Dohmeier  08/29/2020, 4:28 PM [facility] [address], [place] [address] [place], [place] [address] [phone]

## 2020-09-07 ENCOUNTER — Other Ambulatory Visit: Payer: Self-pay | Admitting: Physician Assistant

## 2020-09-07 ENCOUNTER — Other Ambulatory Visit: Payer: Self-pay

## 2020-09-07 ENCOUNTER — Ambulatory Visit
Admission: RE | Admit: 2020-09-07 | Discharge: 2020-09-07 | Disposition: A | Discharge status: Home / Self Care | Payer: Medicare Other | Source: Ambulatory Visit | Attending: Physician Assistant | Admitting: Physician Assistant

## 2020-09-07 DIAGNOSIS — Z8719 Personal history of other diseases of the digestive system: Secondary | ICD-10-CM | POA: Diagnosis not present

## 2020-09-07 DIAGNOSIS — M25551 Pain in right hip: Secondary | ICD-10-CM | POA: Diagnosis not present

## 2020-09-07 DIAGNOSIS — W19XXXA Unspecified fall, initial encounter: Secondary | ICD-10-CM

## 2020-09-08 ENCOUNTER — Telehealth: Payer: Self-pay

## 2020-09-08 NOTE — Telephone Encounter (Signed)
I called and lm on vm advised that this pt was s/po a right IM nail  intertroch fx 03/22/20 and the last eval in office was 05/24/20. We are happy to see in the office if needed we can make an appt at anytime. To call and sch or with additional questions.

## 2020-09-08 NOTE — Telephone Encounter (Signed)
[name] with [facility] would like a call back to discuss further management for patient.  Stated that patient has a right femoral neck Fx.  Cb# [phone].  Please advise.  Thank you.

## 2020-09-08 NOTE — Telephone Encounter (Signed)
[name] [name] would like a call back at [phone].  Thank you.

## 2020-09-09 ENCOUNTER — Telehealth: Payer: Self-pay | Admitting: Orthopedic Surgery

## 2020-09-09 NOTE — Telephone Encounter (Signed)
-----   Message from Pamella Pert, [place] sent at 09/09/2020  8:23 AM EST ----- Regarding: appt Can you please call this pt and make an appt for Dr. Sharol Given on Monday please? I can open up any time that they need.

## 2020-09-09 NOTE — Telephone Encounter (Signed)
Minette Brine is a PA and she spoke with maryanne this morning about the pt. He is a new hip fx same hip as the previous surgery. Xrays were obtained and Maryanne advised that if the pt is ambulatory and not in pain ok to see the pt Monday. Message to the desk to contact the pt and sch appt. Had advised that if the pt is in discomfort to have him proceed to the ER. Will sch pt appt to come in the office Monday.

## 2020-09-09 NOTE — Telephone Encounter (Signed)
I called and lm on vm to advise. To call back with questions or to set up appt.

## 2020-09-09 NOTE — Telephone Encounter (Signed)
Called pt and LVM asking for a CB; left my name.

## 2020-09-09 NOTE — Telephone Encounter (Signed)
This pt is s/p a right IM nail right hip 03/22/20 and has sustained another hip fx. Xrays are on epic. Pt is ambulatory per [facility] and in minimal discomfort ( discussed with Latanya Presser this am) per pt family it is difficult to get the pt out to appts ( had sch for Monday) wanted to know if you would review the xrays and decide if he needed to come in or not to discuss treatment or just leave as is.

## 2020-09-09 NOTE — Telephone Encounter (Signed)
Patient has a nondisplaced femoral neck fracture with a intramedullary fixation in place.  Patient may be treated nonoperatively with strict nonweightbearing on the right.  Follow-up in the office for radiographs when it is convenient or if symptoms worsen.

## 2020-09-09 NOTE — Telephone Encounter (Signed)
[name] [name] states a good CB # is [phone]

## 2020-09-21 DIAGNOSIS — I10 Essential (primary) hypertension: Secondary | ICD-10-CM | POA: Diagnosis not present

## 2020-09-21 DIAGNOSIS — K219 Gastro-esophageal reflux disease without esophagitis: Secondary | ICD-10-CM | POA: Diagnosis not present

## 2020-09-21 DIAGNOSIS — N1832 Chronic kidney disease, stage 3b: Secondary | ICD-10-CM | POA: Diagnosis not present

## 2020-09-21 DIAGNOSIS — G301 Alzheimer's disease with late onset: Secondary | ICD-10-CM | POA: Diagnosis not present

## 2020-09-21 DIAGNOSIS — E7800 Pure hypercholesterolemia, unspecified: Secondary | ICD-10-CM | POA: Diagnosis not present

## 2020-09-26 ENCOUNTER — Telehealth: Payer: Self-pay

## 2020-09-26 NOTE — Telephone Encounter (Signed)
Called and sw pt's wife and made appt for tomorrow at 2pm.

## 2020-09-26 NOTE — Telephone Encounter (Signed)
Pt wife called and would like to know when she should schedule an appt for her husband to come in.   She just wanted to know how many weeks to wait and when would be the best time for him to come in a get xrays.  I offered to make her an apt for her husband but she was very adamant on speaking with Autumn  [phone]

## 2020-09-27 ENCOUNTER — Ambulatory Visit (INDEPENDENT_AMBULATORY_CARE_PROVIDER_SITE_OTHER): Payer: Medicare Other

## 2020-09-27 ENCOUNTER — Encounter: Payer: Self-pay | Admitting: Orthopedic Surgery

## 2020-09-27 ENCOUNTER — Ambulatory Visit (INDEPENDENT_AMBULATORY_CARE_PROVIDER_SITE_OTHER): Payer: Medicare Other | Admitting: Orthopedic Surgery

## 2020-09-27 DIAGNOSIS — M25551 Pain in right hip: Secondary | ICD-10-CM

## 2020-09-27 NOTE — Progress Notes (Signed)
Office Visit Note   Patient: [name] [name]           Date of Birth: 12-20-1925           MRN: [id number] Visit Date: 09/27/2020              Requested by: [name], MD [address] [place] [address],  [place] [address] PCP: [name], MD  Chief Complaint  Patient presents with  . Right Hip - Follow-up    Hx 2021 IM nail right hp Fall and had non displaced femoral neck fx  noted 09/09/2020      HPI: Patient is a 87 year old gentleman who presents in follow-up after a fall for which she sustained a nondisplaced femoral neck fracture.  Patient has previously had intramedullary fixation for bilateral intertrochanteric hip fractures.  Patient states he has no pain with weightbearing at this time.  Assessment & Plan: Visit Diagnoses:  1. Pain in right hip     Plan: Patient will increase his activities as tolerated the importance of using his walker to minimize risk of falling was discussed.  Follow-Up Instructions: Return if symptoms worsen or fail to improve.   Ortho Exam  Patient is alert, oriented, no adenopathy, well-dressed, normal affect, normal respiratory effort. Examination patient is ambulating in a wheelchair.  He has no pain with internal or external rotation of the right hip.  Radiographs shows stable internal fixation with calcification of the femoral arteries  Imaging: XR HIP UNILAT W OR W/O PELVIS 2-3 VIEWS RIGHT  Result Date: 09/27/2020 2 view radiographs of the right hip shows stable internal fixation no displacement of the femoral neck fracture.  No images are attached to the encounter.  Labs: Lab Results  Component Value Date   HGBA1C  12/13/2008    5.5 (NOTE) The ADA recommends the following therapeutic goal for glycemic control related to Hgb A1c measurement: Goal of therapy: <6.5 Hgb A1c  Reference: American Diabetes Association: Clinical Practice Recommendations 2010, Diabetes Care, 2010, 33: (Suppl  1).   REPTSTATUS 06/10/2018  FINAL 06/05/2018   CULT  06/05/2018    NO GROWTH 5 DAYS Performed at [facility], [address], [place], [place] [address]      Lab Results  Component Value Date   ALBUMIN 2.6 (A) 05/18/2020   ALBUMIN 2.8 (A) 04/22/2020   ALBUMIN 3.2 (A) 04/05/2020    Lab Results  Component Value Date   MG 2.2 12/16/2008   MG 2.5 12/15/2008   No results found for: VD25OH  No results found for: PREALBUMIN CBC EXTENDED Latest Ref Rng & Units 05/18/2020 04/22/2020 04/22/2020  WBC - 5.4 9.0 6.4  RBC 3.87 - 5.11 2.87(A) 3.28(A) 3.23(A)  HGB 13.5 - 17.5 8.6(A) 9.9(A) 9.7(A)  HCT 41 - 53 27(A) 31(A) 31(A)  PLT 150 - 399 291 409(A) 269  NEUTROABS - - 4,167.00 3,674.00  LYMPHSABS 0.7 - 4.0 K/uL - - -     There is no height or weight on file to calculate BMI.  Orders:  Orders Placed This Encounter  Procedures  . XR HIP UNILAT W OR W/O PELVIS 2-3 VIEWS RIGHT   No orders of the defined types were placed in this encounter.    Procedures: No procedures performed  Clinical Data: No additional findings.  ROS:  All other systems negative, except as noted in the HPI. Review of Systems  Objective: Vital Signs: There were no vitals taken for this visit.  Specialty Comments:  No specialty comments available.  PMFS History: Patient Active Problem List   Diagnosis Date Noted  . Hip fracture requiring operative repair ([place]) 08/29/2020  . Dysphagia 05/19/2020  . Cough 05/18/2020  . GERD (gastroesophageal reflux disease) 04/22/2020  . Weight loss 04/20/2020  . Slow transit constipation 04/20/2020  . CKD (chronic kidney disease) stage 3, GFR 30-59 ml/min ([facility]) 04/06/2020  . Right hip pain 04/04/2020  . Depression, recurrent ([place]) 04/04/2020  . Malaise 04/04/2020  . Acquired thrombophilia ([facility]) 03/23/2020  . Hip fracture ([place]) 03/21/2020  . DNR (do not resuscitate) 03/21/2020  . Urinary urgency 01/01/2019  . Candidiasis 01/01/2019  . Closed displaced intertrochanteric  fracture of left femur ([place]) 12/25/2018  . Closed intertrochanteric fracture of hip, left, initial encounter ([place]) 12/25/2018  . Pain due to onychomycosis of toenails of both feet 12/12/2018  . Acute blood loss anemia 06/08/2018  . GI bleed 06/05/2018  . HTN (hypertension)   . High cholesterol   . Diverticulosis   . Lower GI bleed   . AAA (abdominal aortic aneurysm) without rupture ([place]) 01/09/2018  . Ascending aortic aneurysm ([place]) 01/09/2018  . Heart block AV second degree 07/15/2017  . Status post three vessel coronary artery bypass 07/15/2017  . Left ventricular systolic dysfunction without heart failure 05/11/2017  . Hypercholesterolemia 05/11/2017  . CHB (complete heart block) ([place]) 06/07/2015  . Alzheimer's disease ([place]) 01/26/2015  . Pacemaker 01/26/2013  . Permanent atrial fibrillation ([place]) 01/26/2013  . Second degree AV block, Mobitz type II 01/26/2013  . Coronary artery disease involving native coronary artery of native heart without angina pectoris 01/26/2013  . Dyspnea 01/26/2013   Past Medical History:  Diagnosis Date  . Atrial fibrillation, permanent ([place])    no longer on AC due to GI bleed  . CAD (coronary artery disease)   . Dementia ([place])   . Diverticulosis    diverticular bleed 2011  . High cholesterol   . HTN (hypertension)    has improved over time, no rx as of 2019  . Lower GI bleed     Family History  Problem Relation Age of Onset  . Stroke Father   . Cervical cancer Other        siblings  . COPD Sister   . Dementia Brother     Past Surgical History:  Procedure Laterality Date  . angiolplasty    . APPENDECTOMY    . INSERT / REPLACE / REMOVE PACEMAKER    . INTRAMEDULLARY (IM) NAIL INTERTROCHANTERIC Left 12/26/2018   Procedure: INTRAMEDULLARY (IM) NAIL INTERTROCHANTRIC;  Surgeon: Leandrew Koyanagi, MD;  Location: [facility];  Service: Orthopedics;  Laterality: Left;  . INTRAMEDULLARY (IM) NAIL INTERTROCHANTERIC Right 03/22/2020   Procedure:  INTRAMEDULLARY (IM) NAIL INTERTROCHANTRIC;  Surgeon: Newt Minion, MD;  Location: [facility];  Service: Orthopedics;  Laterality: Right;  . KNEE SURGERY    . PILONIDAL CYST EXCISION    . rotator cuff surgery  1985  . TONSILLECTOMY    . triple heart bypass     Social History   Occupational History  . Occupation: retired  Tobacco Use  . Smoking status: Former Smoker    Quit date: 07/02/1974    Years since quitting: 46.2  . Smokeless tobacco: Never Used  Vaping Use  . Vaping Use: Never used  Substance and Sexual Activity  . Alcohol use: Not Currently    Comment: last used in dec 2019  . Drug use: No  . Sexual activity: Not on file

## 2020-09-30 ENCOUNTER — Encounter: Payer: Medicare Other | Admitting: Cardiovascular Disease

## 2020-10-19 ENCOUNTER — Ambulatory Visit (INDEPENDENT_AMBULATORY_CARE_PROVIDER_SITE_OTHER): Payer: Medicare Other | Admitting: Podiatry

## 2020-10-19 ENCOUNTER — Other Ambulatory Visit: Payer: Self-pay

## 2020-10-19 ENCOUNTER — Encounter: Payer: Self-pay | Admitting: Podiatry

## 2020-10-19 DIAGNOSIS — N1832 Chronic kidney disease, stage 3b: Secondary | ICD-10-CM

## 2020-10-19 DIAGNOSIS — M79674 Pain in right toe(s): Secondary | ICD-10-CM

## 2020-10-19 DIAGNOSIS — M79675 Pain in left toe(s): Secondary | ICD-10-CM

## 2020-10-19 DIAGNOSIS — B351 Tinea unguium: Secondary | ICD-10-CM

## 2020-10-19 NOTE — Progress Notes (Signed)
This patient returns to the office for evaluation and treatment of long thick painful nails .  This patient is unable to trim his own nails since the patient cannot reach the feet.  Patient says the nails are painful walking and wearing his shoes.  He returns for preventive foot care services. He presents to the office with wife.  General Appearance  Alert, conversant and in no acute stress.  Vascular  Dorsalis pedis and posterior tibial  pulses are not   palpable  bilaterally.  Capillary return is within normal limits  Bilaterally  Cold feet  Bilaterally. Absent hair.  Neurologic  Senn-Weinstein monofilament wire test within normal limits  bilaterally. Muscle power within normal limits bilaterally.  Nails Thick disfigured discolored nails with subungual debris  from hallux to fifth toes bilaterally. No evidence of bacterial infection or drainage bilaterally.  Orthopedic  No limitations of motion  feet .  No crepitus or effusions noted.  No bony pathology or digital deformities noted.  Skin  normotropic skin with no porokeratosis noted bilaterally.  No signs of infections or ulcers noted.     Onychomycosis  Pain in toes right foot  Pain in toes left foot  Debridement  of nails  1-5  B/L with a nail nipper.  Nails were then filed using a dremel tool with no incidents.    RTC  10 weeks   Gardiner Barefoot DPM

## 2020-10-25 ENCOUNTER — Telehealth: Payer: Self-pay

## 2020-10-25 NOTE — Telephone Encounter (Signed)
I connected by phone with Hayes Ludwig and/or patient's caregiver on 10/25/2020 at 9:06 AM to discuss the potential vaccination through our Homebound vaccination initiative.   Prevaccination Checklist for COVID-19 Vaccines  1.  Are you feeling sick today? no  2.  Have you ever received a dose of a COVID-19 vaccine?  yes      If yes, which one? Pfizer   How many dose of Covid-19 vaccine have your received and dates ? 3, 07/17/2019, 08/07/2019, 04/27/2020   Check all that apply: I live in a long-term care setting. no  I have been diagnosed with a medical condition(s). Please list: 87 year old male with bilateral hip fractures with surgery, difficulty ambulating. (pertinent to homebound status)  I am a first responder. 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 no  4. Do you have a health condition or are you undergoing treatment that makes you moderately or severely immunocompromised? (This would include treatment for cancer or HIV, receipt of organ transplant, immunosuppressive therapy or high-dose corticosteroids, CAR-T-cell therapy, hematopoietic cell transplant [HCT], DiGeorge syndrome or Wiskott-Aldrich syndrome)  no  5. Have you received hematopoietic cell transplant (HCT) or CAR-T-cell therapies since receiving COVID-19 vaccine? no  6.  Have you ever had an allergic reaction: (This would include a severe reaction [ e.g., anaphylaxis] that required treatment with epinephrine or EpiPen or that caused you to go to the hospital.  It would also include an allergic reaction that occurred within 4 hours that caused hives, swelling, or respiratory distress, including wheezing.) A.  A previous dose of COVID-19 vaccine. no  B.  A vaccine or injectable therapy that contains multiple components, one of which is a COVID-19 vaccine component, but it is not known which component elicited  the immediate reaction. no  C.  Are you allergic to polyethylene glycol? no  D. Are you allergic to Polysorbate, which is found in some vaccines, film coated tablets and intravenous steroids?  no   7.  Have you ever had an allergic reaction to another vaccine (other than COVID-19 vaccine) or an injectable medication? (This would include a severe reaction [ e.g., anaphylaxis] that required treatment with epinephrine or EpiPen or that caused you to go to the hospital.  It would also include an allergic reaction that occurred within 4 hours that caused hives, swelling, or respiratory distress, including wheezing.)  no   8.  Have you ever had a severe allergic reaction (e.g., anaphylaxis) to something other than a component of the COVID-19 vaccine, or any vaccine or injectable medication?  This would include food, pet, venom, environmental, or oral medication allergies.  no   Check all that apply to you:  Am a male between ages 16 and 74 years old  no  Women 61 through 85 years of age can receive any FDA-authorized or -approved COVID-19 vaccine. However, they should be informed of the rare but increased risk of thrombosis with thrombocytopenia syndrome (TTS) after receipt of the [employer] Vaccine and the availability of other FDA-authorized and -approved COVID-19 vaccines. People who had TTS after a first dose of Janssen vaccine should not receive a subsequent dose of Janssen product    Am a male between ages 41 and 72 years old  no Males 5 through 85 years of age may receive the correct formulation of Pfizer-BioNTech COVID-19 vaccine. Males 18 and older can receive any FDA-authorized  or -approved vaccine. However, people receiving an mRNA COVID-19 vaccine, especially males 43 through 85 years of age and their parents/legal representative (when relevant), should be informed of the risk of developing myocarditis (an inflammation of the heart muscle) or pericarditis (inflammation of the lining around  the heart) after receipt of an mRNA vaccine. The risk of developing either myocarditis or pericarditis after vaccination is low, and lower than the risk of myocarditis associated with SARS-CoV-2 infection in adolescents and adults. Vaccine recipients should be counseled about the need to seek care if symptoms of myocarditis or pericarditis develop after vaccination     Have a history of myocarditis or pericarditis  no Myocarditis or pericarditis after receipt of the first dose of an mRNA COVID-19 vaccine series but before administration of the second dose  Experts advise that people who develop myocarditis or pericarditis after a dose of an mRNA COVID-19 vaccine not receive a subsequent dose of any COVID-19 vaccine, until additional safety data are available.  Administration of a subsequent dose of COVID-19 vaccine before safety data are available can be considered in certain circumstances after the episode of myocarditis or pericarditis has completely resolved. Until additional data are available, some experts recommend a Alphonsa Overall COVID-19 vaccine be considered instead of an mRNA COVID-19 vaccine. Decisions about proceeding with a subsequent dose should include a conversation between the patient, their parent/legal representative (when relevant), and their clinical team, which may include a cardiologist.    Have been treated with monoclonal antibodies or convalescent serum to prevent or treat COVID-19  no Vaccination should be offered to people regardless of history of prior symptomatic or asymptomatic SARS-CoV-2 infection. There is no recommended minimal interval between infection and vaccination.  However, vaccination should be deferred if a patient received monoclonal antibodies or convalescent serum as treatment for COVID-19 or for post-exposure prophylaxis. This is a precautionary measure until additional information becomes available, to avoid interference of the antibody treatment with  vaccine-induced immune responses.  Defer COVID-19 vaccination for 30 days when a passive antibody product was used for post-exposure prophylaxis.  Defer COVID-19 vaccination for 90 days when a passive antibody product was used to treat COVID-19.     Diagnosed with Multisystem Inflammatory Syndrome (MIS-C or MIS-A) after a COVID-19 infection  no It is unknown if people with a history of MIS-C or MIS-A are at risk for a dysregulated immune response to COVID-19 vaccination.  People with a history of MIS-C or MIS-A may choose to be vaccinated. Considerations for vaccination may include:   Clinical recovery from MIS-C or MIS-A, including return to normal cardiac function   Personal risk of severe acute COVID-19 (e.g., age, underlying conditions)   High or substantial community transmission of SARS-CoV-2 and personal increased risk of reinfection.   Timing of any immunomodulatory therapies (general best practice guidelines for immunization can be consulted for more information [email or web address])   It has been 90 days or more since their diagnosis of MIS-C   Onset of MIS-C occurred before any COVID-19 vaccination   A conversation between the patient, their guardian(s), and their clinical team or a specialist may assist with COVID-19 vaccination decisions. Healthcare providers and health departments may also request a consultation from the [facility] at [email or web address].     Have a bleeding disorder  no Take a blood thinner  no As with all vaccines, any COVID-19 vaccine product may be given to these patients, if a physician familiar with the patient's bleeding risk determines that  the vaccine can be administered intramuscularly with reasonable safety.  ACIP recommends the following technique for intramuscular vaccination in patients with bleeding disorders or taking  blood thinners: a fine-gauge needle (23-gauge or smaller caliber) should be used for the vaccination, followed by firm pressure on the site, without rubbing, for at least 2 minutes.  People who regularly take aspirin or anticoagulants as part of their routine medications do not need to stop these medications prior to receipt of any COVID-19 vaccine.    Have a history of heparin-induced thrombocytopenia (HIT)  no Although the etiology of TTS associated with the Alphonsa Overall COVID-19 vaccine is unclear, it appears to be similar to another rare immune-mediated syndrome, heparin-induced thrombocytopenia (HIT). People with a history of an episode of an immune-mediated syndrome characterized by thrombosis and thrombocytopenia, such as HIT, should be offered a currently FDA-approved or FDA-authorized mRNA COVID-19 vaccine if it has been ?90 days since their TTS resolved. After 90 days, patients may be vaccinated with any currently FDA-approved or FDA-authorized COVID-19 vaccine, including Janssen COVID-19 Vaccine. However, people who developed TTS after their initial Alphonsa Overall vaccine should not receive a Janssen booster dose.  Experts believe the following factors do not make people more susceptible to TTS after receipt of the [employer]. People with these conditions can be vaccinated with any FDA-authorized or - approved COVID-19 vaccine, including the [employer] COVID-19 Vaccine:   A prior history of venous thromboembolism   Risk factors for venous thromboembolism (e.g., inherited or acquired thrombophilia including Factor V Leiden; prothrombin gene 20210A mutation; antiphospholipid syndrome; protein C, protein S or antithrombin deficiency   A prior history of other types of thromboses not associated with thrombocytopenia   Pregnancy, post-partum status, or receipt of hormonal contraceptives (e.g., combined oral contraceptives, patch, ring)   Additional recipient education materials can be found at  http://gutierrez-robinson.com/ vaccines/safety/JJUpdate.html.    Am currently pregnant or breastfeeding  no Vaccination is recommended for all people aged 78 years and older, including people that are:   Pregnant   Breastfeeding   Trying to get pregnant now or who might become pregnant in the future   Pregnant, breastfeeding, and post-partum people 4 through 85 years of age should be aware of the rare risk of TTS after receipt of the Alphonsa Overall COVID-19 Vaccine and the availability of other FDA-authorized or -approved COVID-19 vaccines (i.e., mRNA vaccines).    Have received dermal fillers  no FDA-authorized or -approved COVID-19 vaccines can be administered to people who have received injectable dermal fillers who have no contraindications for vaccination.  Infrequently, these people might experience temporary swelling at or near the site of filler injection (usually the face or lips) following administration of a dose of an mRNA COVID-19 vaccine. These people should be advised to contact their healthcare provider if swelling develops at or near the site of dermal filler following vaccination.     Have a history of Guillain-Barr Syndrome (GBS)  no People with a history of GBS can receive any FDA-authorized or -approved COVID-19 vaccine. However, given the possible association between the [employer] and an increased risk of GBS, a patient with a history of GBS and their clinical team should discuss the availability of mRNA vaccines to offer protection against COVID-19. The highest risk has been observed in men aged 51-64 years with symptoms of GBS beginning within 42 days after Alphonsa Overall COVID-19 vaccination.  People who had GBS after receiving Janssen vaccine should be made aware of the option to receive an mRNA  COVID-19 vaccine booster at least 2 months (8 weeks) after the Janssen dose. However, Alphonsa Overall vaccine may be used as a booster, particularly if GBS occurred more than 42 days  after vaccination or was related to a non-vaccine factor. Prior to booster vaccination, a conversation between the patient and their clinical team may assist with decisions about use of a COVID-19 booster dose, including the timing of administration     Postvaccination Observation Times for People without Contraindications to Covid 19 Vaccination.  30 minutes:  People with a history of: A contraindication to another type of COVID-19 vaccine product (i.e., mRNA or viral vector COVID-19 vaccines)   Immediate (within 4 hours of exposure) non-severe allergic reaction to a COVID-19 vaccine or injectable therapies   Anaphylaxis due to any cause   Immediate allergic reaction of any severity to a non-COVID-19 vaccine   15 minutes: All other people  This patient is a 87 y.o. male that meets the FDA criteria to receive homebound vaccination. Patient or parent/caregiver understands they have the option to accept or refuse homebound vaccination.  Patient passed the pre-screening checklist and would like to proceed with homebound vaccination.  Based on questionnaire above, I recommend the patient be observed for 15 minutes.  There are an estimated #1 other household members/caregivers who are also interested in receiving the vaccine.    The patient has been confirmed homebound and eligible for homebound vaccination with the considerations outlined above. I will send the patient's information to our scheduling team who will reach out to schedule the patient and potential caregiver/family members for homebound vaccination.    Dan Humphreys 10/25/2020 9:06 AM

## 2020-10-26 ENCOUNTER — Ambulatory Visit: Payer: Medicare Other | Attending: Critical Care Medicine

## 2020-10-26 DIAGNOSIS — Z23 Encounter for immunization: Secondary | ICD-10-CM

## 2020-10-26 NOTE — Progress Notes (Signed)
   Covid-19 Vaccination Clinic  Name:  GLENDON DUNWOODY    MRN: [id number] DOB: 10-Oct-1925  10/26/2020  Mr. Mierzwa was observed post Covid-19 immunization for 15 minutes without incident. He was provided with Vaccine Information Sheet and instruction to access the V-Safe system.   Mr. Lebow was instructed to call 911 with any severe reactions post vaccine: [name] Difficulty breathing  . Swelling of face and throat  . A fast heartbeat  . A bad rash all over body  . Dizziness and weakness   Immunizations Administered    Name Date Dose VIS Date Route   PFIZER Comrnaty(Gray TOP) Covid-19 Vaccine 10/26/2020 11:15 AM 0.3 mL 06/09/2020 Intramuscular   Manufacturer: [employer]   Lot: [id number]   NDC: [phone]

## 2020-11-02 DIAGNOSIS — K219 Gastro-esophageal reflux disease without esophagitis: Secondary | ICD-10-CM | POA: Diagnosis not present

## 2020-11-02 DIAGNOSIS — N1832 Chronic kidney disease, stage 3b: Secondary | ICD-10-CM | POA: Diagnosis not present

## 2020-11-02 DIAGNOSIS — I10 Essential (primary) hypertension: Secondary | ICD-10-CM | POA: Diagnosis not present

## 2020-11-02 DIAGNOSIS — G301 Alzheimer's disease with late onset: Secondary | ICD-10-CM | POA: Diagnosis not present

## 2020-11-02 DIAGNOSIS — E7800 Pure hypercholesterolemia, unspecified: Secondary | ICD-10-CM | POA: Diagnosis not present

## 2020-11-04 ENCOUNTER — Other Ambulatory Visit: Payer: Self-pay

## 2020-11-04 ENCOUNTER — Encounter: Payer: Self-pay | Admitting: Cardiovascular Disease

## 2020-11-04 ENCOUNTER — Ambulatory Visit (INDEPENDENT_AMBULATORY_CARE_PROVIDER_SITE_OTHER): Payer: Medicare Other | Admitting: Cardiovascular Disease

## 2020-11-04 VITALS — BP 126/66 | HR 60 | Ht 73.0 in | Wt 145.2 lb

## 2020-11-04 DIAGNOSIS — I2510 Atherosclerotic heart disease of native coronary artery without angina pectoris: Secondary | ICD-10-CM | POA: Diagnosis not present

## 2020-11-04 DIAGNOSIS — I519 Heart disease, unspecified: Secondary | ICD-10-CM

## 2020-11-04 DIAGNOSIS — I714 Abdominal aortic aneurysm, without rupture, unspecified: Secondary | ICD-10-CM

## 2020-11-04 DIAGNOSIS — I712 Thoracic aortic aneurysm, without rupture: Secondary | ICD-10-CM | POA: Diagnosis not present

## 2020-11-04 DIAGNOSIS — I442 Atrioventricular block, complete: Secondary | ICD-10-CM | POA: Diagnosis not present

## 2020-11-04 DIAGNOSIS — Z950 Presence of cardiac pacemaker: Secondary | ICD-10-CM

## 2020-11-04 DIAGNOSIS — I4821 Permanent atrial fibrillation: Secondary | ICD-10-CM

## 2020-11-04 DIAGNOSIS — I7121 Aneurysm of the ascending aorta, without rupture: Secondary | ICD-10-CM

## 2020-11-04 NOTE — Progress Notes (Signed)
Cardiology Office Note    Date:  11/10/2020   ID:  [name] [name], DOB 1926-03-26, MRN [id number]  PCP:  [name], MD  Cardiologist:   [name] Klein, MD   Chief Complaint  Patient presents with  . Follow-up  Pacemaker check, CAD  History of Present Illness:  [name] [name] is a 87 y.o. male with complete heart block (pacemaker dependent), permanent atrial fibrillation, CAD s/p CABG, Alzheimer's dementia, small ascending thoracic aortic aneurysm (4 cm), small abdominal aortic aneurysm (2.8 cm), hypertension and hyperlipidemia here for follow-up on his pacemaker (2009, [employer] Zephyr XL DR, programmed VVIR).  He is not on anticoagulation due to severe gait instability and falls leading to fractures as well as 2 episodes of severe lower GI bleeding.  His left atrial appendage was surgically ligated.  His mobility continues to deteriorate and his had several more falls.  He has not had thankfully any serious bleeding or head injuries.  [name] [name] continues to deteriorate.  He is less interactive today compared with previous appointments.  Interrogation of his [facility] dual-chamber pacemaker shows the device is functioning normally, but the battery longevity is estimated now at less than 6 months.  The leads were implanted in 2001.  The current generator was implanted in 2009.  He is in atrial fibrillation with 100% ventricular paced rhythm and does not have any escape beats when pacing is taken down to less than 30 bpm.  He appears to be pacemaker dependent.  The heart rate histograms are very blunted but he is also quite sedentary.  Lead parameters remain normal.  The patient specifically denies any chest pain at rest exertion, dyspnea at rest or with exertion, orthopnea, paroxysmal nocturnal dyspnea, syncope, palpitations, focal neurological deficits, intermittent claudication, lower extremity edema, unexplained weight gain, cough, hemoptysis or wheezing.  He has permanent atrial  fibrillation and his [place] dual-chamber pacemaker is programmed VVIR.  Echo performed in July 2019 shows borderline LV systolic function with EF of 50-55%, aortic valve sclerosis without stenosis.  He had bypass surgery in 2010 [name] [name], LIMA to LAD, SVG to distal circumflex, SVG to distal RCA) and has not had any signs or symptoms of coronary disease since that time.  He had a surgical maze ablation and ligation of left atrial appendage at that time. His nuclear stress test in August 2014 showed a dense inferolateral scar. Left ventricular ejection fraction was around 50%.   Past Medical History:  Diagnosis Date  . Atrial fibrillation, permanent ([place])    no longer on AC due to GI bleed  . CAD (coronary artery disease)   . Dementia ([place])   . Diverticulosis    diverticular bleed 2011  . High cholesterol   . HTN (hypertension)    has improved over time, no rx as of 2019  . Lower GI bleed     Past Surgical History:  Procedure Laterality Date  . angiolplasty    . APPENDECTOMY    . INSERT / REPLACE / REMOVE PACEMAKER    . INTRAMEDULLARY (IM) NAIL INTERTROCHANTERIC Left 12/26/2018   Procedure: INTRAMEDULLARY (IM) NAIL INTERTROCHANTRIC;  Surgeon: Leandrew Koyanagi, MD;  Location: [place];  Service: Orthopedics;  Laterality: Left;  . INTRAMEDULLARY (IM) NAIL INTERTROCHANTERIC Right 03/22/2020   Procedure: INTRAMEDULLARY (IM) NAIL INTERTROCHANTRIC;  Surgeon: Newt Minion, MD;  Location: [place] [place];  Service: Orthopedics;  Laterality: Right;  . KNEE SURGERY    . PILONIDAL CYST EXCISION    .  rotator cuff surgery  1985  . TONSILLECTOMY    . triple heart bypass      Current Medications: Outpatient Medications Prior to Visit  Medication Sig Dispense Refill  . acetaminophen (TYLENOL) 500 MG tablet Take 1 tablet (500 mg total) by mouth every 6 (six) hours as needed for mild pain. 30 tablet 0  . aspirin EC 81 MG tablet Take 81 mg by mouth daily. Swallow whole.    [name] atorvastatin  (LIPITOR) 40 MG tablet Take 40 mg by mouth every evening.     . Cholecalciferol (VITAMIN D) 2000 UNITS CAPS Take 2,000 Units by mouth at bedtime.     . donepezil (ARICEPT) 5 MG tablet Take 1 tablet (5 mg total) by mouth 2 (two) times daily. 180 tablet 3  . ferrous sulfate 325 (65 FE) MG EC tablet Take 325 mg by mouth daily.     . finasteride (PROSCAR) 5 MG tablet Take 5 mg by mouth every evening.     . lactose free nutrition (BOOST) LIQD Take 237 mLs by mouth daily. Add chocolate ice cream to make a milkshake    . omeprazole (PRILOSEC) 20 MG capsule Take 20 mg by mouth daily.    . polyethylene glycol (MIRALAX / GLYCOLAX) 17 g packet Take 17 g by mouth daily as needed for mild constipation. 14 each 0  . sertraline (ZOLOFT) 25 MG tablet Take 1 tablet (25 mg total) by mouth daily. 30 tablet 1   No facility-administered medications prior to visit.     Allergies:   Amiodarone; Antihistamines, diphenhydramine-type; Lovastatin; and Other   Social History   Socioeconomic History  . Marital status: Married    Spouse name: [name]  . Number of children: 6  . Years of education: college  . Highest education level: Not on file  Occupational History  . Occupation: retired  Tobacco Use  . Smoking status: Former Smoker    Quit date: 07/02/1974    Years since quitting: 46.3  . Smokeless tobacco: Never Used  Vaping Use  . Vaping Use: Never used  Substance and Sexual Activity  . Alcohol use: Not Currently    Comment: last used in dec 2019  . Drug use: No  . Sexual activity: Not on file  Other Topics Concern  . Not on file  Social History Narrative   Patient is married [name]) and lives at home with his wife.   Patient has a college education.   Patient is a retired Materials engineer.   Patient does not drink any caffeine.   Patient has six children.   Social Determinants of Health   Financial Resource Strain: Not on file  Food Insecurity: Not on file  Transportation Needs:  Not on file  Physical Activity: Not on file  Stress: Not on file  Social Connections: Not on file     Family History:  The patient's family history includes COPD in his sister; Cervical cancer in an other family member; Dementia in his brother; Stroke in his father.   ROS:   Please see the history of present illness.    ROS All other systems are reviewed and are negative.  System is largely obtained from his wife since his memory is poor   PHYSICAL EXAM:   VS:  BP 126/66 (BP Location: Left Arm, Patient Position: Sitting, Cuff Size: Normal)   Pulse 60   Ht 6\' 1"  (1.854 m)   Wt 145 lb 3.2 oz (65.9 kg)   BMI 19.16  kg/m       General: Alert, oriented to self and circumstances, but not to exact location and time, no distress, elderly and frail.  In a wheelchair. Head: no evidence of trauma, PERRL, EOMI, no exophtalmos or lid lag, no myxedema, no xanthelasma; normal ears, nose and oropharynx Neck: normal jugular venous pulsations and no hepatojugular reflux; brisk carotid pulses without delay and no carotid bruits Chest: clear to auscultation, no signs of consolidation by percussion or palpation, normal fremitus, symmetrical and full respiratory excursions Cardiovascular: normal position and quality of the apical impulse, regular rhythm, normal first and paradoxically split second heart sounds, no murmurs, rubs or gallops Abdomen: no tenderness or distention, no masses by palpation, no abnormal pulsatility or arterial bruits, normal bowel sounds, no hepatosplenomegaly Extremities: no clubbing, cyanosis or edema; 2+ radial, ulnar and brachial pulses bilaterally; 2+ right femoral, posterior tibial and dorsalis pedis pulses; 2+ left femoral, posterior tibial and dorsalis pedis pulses; no subclavian or femoral bruits Neurological: grossly nonfocal Psych: Normal mood and affect   Wt Readings from Last 3 Encounters:  11/04/20 145 lb 3.2 oz (65.9 kg)  05/18/20 142 lb 11.2 oz (64.7 kg)   05/17/20 142 lb 11.2 oz (64.7 kg)      Studies/Labs Reviewed:   EKG:  EKG is ordered today and shows 100% ventricular paced rhythm with a background of atrial fibrillation.  QTc 464 ms  BMET    Component Value Date/Time   NA 140 05/18/2020 0000   K 4.6 05/18/2020 0000   CL 106 05/18/2020 0000   CO2 27 (A) 05/18/2020 0000   GLUCOSE 93 03/24/2020 0456   BUN 26 (A) 05/18/2020 0000   CREATININE 1.3 05/18/2020 0000   CREATININE 1.71 (H) 03/24/2020 0456   CALCIUM 8.4 (A) 05/18/2020 0000   GFRNONAA 47 05/18/2020 0000   GFRAA 54 05/18/2020 0000   Lipid profile 04/08/2019 total cholesterol 141, triglycerides 92, HDL 45, LDL 78  10/07/2019 Cholesterol 136, HDL 48, LDL 73, triglycerides 78 Hemoglobin 11.3, normal liver function tests 02/08/2020  creatinine 1.59, potassium 4.7  08/04/2020 Creatinine 2.94  09/07/2020 Hemoglobin 9.8  ASSESSMENT:    1. CHB (complete heart block) ([place])   2. Permanent atrial fibrillation ([place])   3. Coronary artery disease involving native coronary artery of native heart without angina pectoris   4. Left ventricular systolic dysfunction without heart failure   5. Pacemaker   6. Ascending aortic aneurysm ([place])   7. AAA (abdominal aortic aneurysm) without rupture ([facility])      PLAN:  In order of problems listed above:  1. CHB: Now pacemaker dependent. 2. AFib: Permanent arrhythmia.  Off anticoagulants due to frequent falls and recurrent GI bleeding.   CHADSVasc 4 (age 32, CAD, LV dysf). He does have a history of appendage ligation, so embolic risk is lower. 3. CAD s/p CABG: Asymptomatic, albeit in the setting of a very sedentary lifestyle. Risk factors are well addressed. 4. Borderline LV dysfunction: Despite this he has never had clinical heart failure.  He has a tendency to develop hyperkalemia and hypotension when prescribed RAAS inhibitors or beta-blockers.   5. PM: Approaching ERI.  Discussed generator change out with Mr. Marple and his wife.  We  will increase the frequency of device checks.  Unfortunately he cannot do remote downloads with his device. 6. Ascending aortic aneurysm and abdominal aortic aneurysm: Asymptomatic.  He is not a candidate for surgery or device repair.   Medication Adjustments/Labs and Tests Ordered: Current medicines are reviewed at  length with the patient today.  Concerns regarding medicines are outlined above.  Medication changes, Labs and Tests ordered today are listed in the Patient Instructions below. Patient Instructions  Medication Instructions:  No changes *If you need a refill on your cardiac medications before your next appointment, please call your pharmacy*   Lab Work: None ordered If you have labs (blood work) drawn today and your tests are completely normal, you will receive your results only by: [name] MyChart Message (if you have MyChart) OR . A paper copy in the mail If you have any lab test that is abnormal or we need to change your treatment, we will call you to review the results.   Testing/Procedures: None ordered   Follow-Up: At [facility], you and your health needs are our priority.  As part of our continuing mission to provide you with exceptional heart care, we have created designated Provider Care Teams.  These Care Teams include your primary Cardiologist (physician) and Advanced Practice Providers (APPs -  Physician Assistants and Nurse Practitioners) who all work together to provide you with the care you need, when you need it.  We recommend signing up for the patient portal called "MyChart".  Sign up information is provided on this After Visit Summary.  MyChart is used to connect with patients for Virtual Visits (Telemedicine).  Patients are able to view lab/test results, encounter notes, upcoming appointments, etc.  Non-urgent messages can be sent to your provider as well.   To learn more about what you can do with MyChart, go to [email or web address].    Your next  appointment:   Keep your appointment on 02/27/21 at 11:40 with Dr. Sallyanne Kuster    Signed, [name] Klein, MD  11/10/2020 5:27 PM    [facility] [address], [place], [place]  [address] Phone: [phone]; Fax: [phone]

## 2020-11-04 NOTE — Patient Instructions (Signed)
Medication Instructions:  No changes *If you need a refill on your cardiac medications before your next appointment, please call your pharmacy*   Lab Work: None ordered If you have labs (blood work) drawn today and your tests are completely normal, you will receive your results only by: [name] MyChart Message (if you have MyChart) OR . A paper copy in the mail If you have any lab test that is abnormal or we need to change your treatment, we will call you to review the results.   Testing/Procedures: None ordered   Follow-Up: At [facility], you and your health needs are our priority.  As part of our continuing mission to provide you with exceptional heart care, we have created designated Provider Care Teams.  These Care Teams include your primary Cardiologist (physician) and Advanced Practice Providers (APPs -  Physician Assistants and Nurse Practitioners) who all work together to provide you with the care you need, when you need it.  We recommend signing up for the patient portal called "MyChart".  Sign up information is provided on this After Visit Summary.  MyChart is used to connect with patients for Virtual Visits (Telemedicine).  Patients are able to view lab/test results, encounter notes, upcoming appointments, etc.  Non-urgent messages can be sent to your provider as well.   To learn more about what you can do with MyChart, go to [email or web address].    Your next appointment:   Keep your appointment on 02/27/21 at 11:40 with Dr. Sallyanne Kuster

## 2020-11-07 ENCOUNTER — Telehealth: Payer: Self-pay | Admitting: Geriatric Medicine

## 2020-11-07 NOTE — Telephone Encounter (Signed)
   [name] [name] DOB: 02-07-1926 MRN: [id number]   RIDER WAIVER AND RELEASE OF LIABILITY  For purposes of improving physical access to our facilities, [facility] is pleased to partner with third parties to provide [facility] patients or other authorized individuals the option of convenient, on-demand ground transportation services (the [employer] bre[employer]") through use of the technology service that enables users to request on-demand ground transportation from independent third-party providers.  By opting to use and accept these [employer], I, the undersigned, hereby agree on behalf of myself, and on behalf of any minor child using the [employer] for whom I am the parent or legal guardian, as follows:  1. Government social research officer provided to me are provided by independent third-party transportation providers who are not [employer] or employees and who are unaffiliated with [employer]. 2. [facility] is neither a transportation carrier nor a common or public carrier. 3. [facility] has no control over the quality or safety of the transportation that occurs as a result of the [employer]. 4. [facility] cannot guarantee that any third-party transportation provider will complete any arranged transportation service. 5. [facility] makes no representation, warranty, or guarantee regarding the reliability, timeliness, quality, safety, suitability, or availability of any of the Transport Services or that they will be error free. 6. I fully understand that traveling by vehicle involves risks and dangers of serious bodily injury, including permanent disability, paralysis, and death. I agree, on behalf of myself and on behalf of any minor child using the Transport Services for whom I am the parent or legal guardian, that the entire risk arising out of my use of the [employer] remains solely with me, to the maximum extent permitted under applicable law. 7. The [employer] are provided "as is" and "as available." [facility] disclaims all representations and warranties, express, implied or statutory, not expressly set out in these terms, including the implied warranties of merchantability and fitness for a particular purpose. 8. I hereby waive and release [employer] [facility], its agents, employees, officers, directors, representatives, insurers, attorneys, assigns, successors, subsidiaries, and affiliates from any and all past, present, or future claims, demands, liabilities, actions, causes of action, or suits of any kind directly or indirectly arising from acceptance and use of the [employer]. 9. I further waive and release [facility] and its affiliates from all present and future liability and responsibility for any injury or death to persons or damages to property caused by or related to the use of the [employer]. 10. I have read this Waiver and Release of Liability, and I understand the terms used in it and their legal significance. This Waiver is freely and voluntarily given with the understanding that my right (as well as the right of any minor child for whom I am the parent or legal guardian using the [employer]) to legal recourse against [facility] in connection with the [employer] is knowingly surrendered in return for use of these services.   I attest that I read the consent document to [name] [name], gave Mr. Ashland the opportunity to ask questions and answered the questions asked (if any). I affirm that [name] [name] then provided consent for he's participation in this program.     [name] [name], patient's wife consented on his behalf

## 2020-11-08 DIAGNOSIS — D649 Anemia, unspecified: Secondary | ICD-10-CM | POA: Diagnosis not present

## 2020-11-24 ENCOUNTER — Telehealth: Payer: Self-pay

## 2020-11-24 NOTE — Telephone Encounter (Signed)
Attempted to contact patient's wife [name]  to schedule a Palliative Care consult appointment. No answer left a message to return call.

## 2020-11-24 NOTE — Telephone Encounter (Signed)
Spoke with patient's wife [name] and scheduled an in-person Palliative Consult for 12/28/20 @ 12:30PM  COVID screening was negative. No pets in home. Patient lives with wife.   Consent obtained; updated Outlook/Netsmart/Team List and Epic.   Family is aware they may be receiving a call from NP the day before or day of to confirm appointment.

## 2020-12-15 ENCOUNTER — Other Ambulatory Visit: Payer: Self-pay

## 2020-12-15 ENCOUNTER — Other Ambulatory Visit: Payer: Medicare Other | Admitting: Hospice

## 2020-12-15 DIAGNOSIS — F0391 Unspecified dementia with behavioral disturbance: Secondary | ICD-10-CM | POA: Diagnosis not present

## 2020-12-15 DIAGNOSIS — N1832 Chronic kidney disease, stage 3b: Secondary | ICD-10-CM | POA: Diagnosis not present

## 2020-12-15 DIAGNOSIS — Z515 Encounter for palliative care: Secondary | ICD-10-CM | POA: Diagnosis not present

## 2020-12-15 DIAGNOSIS — I714 Abdominal aortic aneurysm, without rupture: Secondary | ICD-10-CM | POA: Diagnosis not present

## 2020-12-15 DIAGNOSIS — I700 Atherosclerosis of aorta: Secondary | ICD-10-CM | POA: Diagnosis not present

## 2020-12-15 DIAGNOSIS — Z79899 Other long term (current) drug therapy: Secondary | ICD-10-CM | POA: Diagnosis not present

## 2020-12-15 DIAGNOSIS — I482 Chronic atrial fibrillation, unspecified: Secondary | ICD-10-CM | POA: Diagnosis not present

## 2020-12-15 DIAGNOSIS — K7030 Alcoholic cirrhosis of liver without ascites: Secondary | ICD-10-CM | POA: Diagnosis not present

## 2020-12-15 DIAGNOSIS — Z950 Presence of cardiac pacemaker: Secondary | ICD-10-CM | POA: Diagnosis not present

## 2020-12-15 DIAGNOSIS — N184 Chronic kidney disease, stage 4 (severe): Secondary | ICD-10-CM | POA: Diagnosis not present

## 2020-12-15 DIAGNOSIS — E7800 Pure hypercholesterolemia, unspecified: Secondary | ICD-10-CM | POA: Diagnosis not present

## 2020-12-15 DIAGNOSIS — D649 Anemia, unspecified: Secondary | ICD-10-CM | POA: Diagnosis not present

## 2020-12-15 DIAGNOSIS — G301 Alzheimer's disease with late onset: Secondary | ICD-10-CM | POA: Diagnosis not present

## 2020-12-15 DIAGNOSIS — I10 Essential (primary) hypertension: Secondary | ICD-10-CM | POA: Diagnosis not present

## 2020-12-15 NOTE — Progress Notes (Signed)
[facility] Consult Note Telephone: [phone]  Fax: [phone]  PATIENT NAME: [name] [name] [address] [place] [address] [phone] (home)  DOB: 04-03-1981 MRN: [id number]  PRIMARY CARE PROVIDER:    [name], MD,  [place] [place] [address] [phone]  REFERRING PROVIDER:   [name], MD [address] [place],  [place] [address] [phone]  RESPONSIBLE PARTY:   Spouse [phone] Information     Name Relation Home Work [address]   [place] [phone]  [phone]   Davis, Ambrosini   [phone]      Regino Schultze: Caregiver Tue - Ludwig Clarks 12pm-4pm  I met face to face with patient and family at home.  Palliative Care was asked to follow this patient by consultation request of  [name], MD to address advance care planning, complex medical decision making and goals of care clarification.  [place] are present with patient during visit. This is the initial visit.    ASSESSMENT AND / RECOMMENDATIONS:   Advance Care Planning: Our advance care planning conversation included a discussion about:    The value and importance of advance care planning  Difference between Hospice and Palliative care Exploration of goals of care in the event of a sudden injury or illness  Identification and preparation of a healthcare agent  Review and updating or creation of an  advance directive document . Decision not to resuscitate or to de-escalate disease focused treatments due to poor prognosis.  CODE STATUS: Spouse affirmed that patient is a DO NOT RESUSCITATE.  Goals of Care: Goals include to maximize quality of life and symptom management.Goal is comfort care, no hospitalization. MOST form filled out today. Selections include no feeding tube, comfort measures, IV fluids as indicated and antibiotics as indicated.  MOST form left with patient at  home; same document uploaded to epic today.  I spent  46 minutes providing this initial consultation. More than 50% of the time in this consultation was spent on counseling patient and coordinating communication. --------------------------------------------------------------------------------------------------------------------------------------  Symptom Management/Plan: Dementia: worsening dementia in line with disease trajectory.  Education provided on what to expect. Encourage reminiscence, word search/puzzles, cueing for recollection.  Promote calm approach and engaging environment.  Continue donepezil and memantine as currently ordered. Provide help during meals to ensure adequate oral intake.  Continue ongoing supportive care.  Routine CBC BMP Afib: Pacemaker present, Aspirin. Patient is followed by a Cardiologist. Anxiety: Managed with Zoloft Fall: Patient is a high fall risk. Fall precautions discussed.  Follow up: Palliative care will continue to follow for complex medical decision making, advance care planning, and clarification of goals. Return 6 weeks or prn.Encouraged to call provider sooner with any concerns.   Family /Caregiver/Community Supports: Patient lives at home with spouse. Caregiver comes in to help with ADLs. Strong family support system identified  PPS: 50%  HOSPICE ELIGIBILITY/DIAGNOSIS: TBD  Chief Complaint: Initial Palliative care visit:Dementia  HISTORY OF PRESENT ILLNESS:  [name] [name] is a 87 y.o. year old male  with multiple medical conditions including Dementia, ongoing for over 10 years ago, worsened in the last three months with increasing memory loss, confusion, needing more help with dressing and self care. Dementia impairs his independence and quality of life.  Dementia associated fatigue makes his memory loss worse;  cueing is sometimes helpful. History of Afib, Bypass surgery , CKD 3, Fall, Anxiety, Abdominal Aortic Aneurysm without surgery.  History  obtained from review of EMR, discussion with primary team, caregiver, family and/or Mr. Avino.  Review and summarization of Epic records shows history from other than patient. Rest of 10 point ROS asked and negative.     Review of lab tests/diagnostics   Results for AVARY, PITSENBARGER (MRN [id number]) as of 12/15/2020 13:04  Ref. Range 05/18/2020 00:00  Sodium Latest Ref Range: 137 - 147  140  Potassium Latest Ref Range: 3.4 - 5.3  4.6  Chloride Latest Ref Range: 99 - 108  106  CO2 Latest Ref Range: 13 - 22  27 (A)  Glucose Unknown 83  BUN Latest Ref Range: 4 - 21  26 (A)  Creatinine Latest Ref Range: 0.6 - 1.3  1.3  Calcium Latest Ref Range: 8.7 - 10.7  8.4 (A)  Alkaline Phosphatase Latest Ref Range: 25 - 125  92  Albumin Latest Ref Range: 3.5 - 5.0  2.6 (A)  AST Latest Ref Range: 14 - 40  22  ALT Latest Ref Range: 10 - 40  8 (A)  Bilirubin, Total Unknown 0.3  GFR, Est Non African American Unknown 47  GFR, Est African American Unknown 54  CBC with Diff and CMP drawn  at PCP office; awaiting result.   ROS General: NAD EYES: denies vision changes ENMT: denies dysphagia Cardiovascular: denies chest pain/discomfort Pulmonary: denies cough, denies SOB Abdomen: endorses good appetite, denies constipation/diarrhea GU: denies dysuria, urinary frequency, reports continence of bowel and bladder MSK:  endorses weakness,  no fall since April 2022 Skin: denies rashes or wounds Neurological: denies pain, denies insomnia Psych: Endorses positive mood, non anxious Heme/lymph/immuno: denies bruises, abnormal bleeding  Physical Exam: Height/Weight 5 feet 8 inches/144 Ibs Constitutional: NAD General: Well groomed, cooperative EYES: anicteric sclera, lids intact, no discharge  ENMT: Moist mucous membrane CV: S1 S2, RRR, no LE edema Pulmonary: LCTA, no increased work of breathing, no cough, Abdomen: active BS + 4 quadrants, soft and non tender GU: no suprapubic tenderness MSK: weakness,  sarcopenia, limited ROM Skin: warm and dry, no rashes or wounds on visible skin Neuro:  weakness, otherwise non focal, memory loss Psych: non-anxious affect Hem/lymph/immuno: no widespread bruising   PAST MEDICAL HISTORY:  Active Ambulatory Problems    Diagnosis Date Noted   Pacemaker 01/26/2013   Permanent atrial fibrillation ([place]) 01/26/2013   Second degree AV block, Mobitz type II 01/26/2013   Coronary artery disease involving native coronary artery of native heart without angina pectoris 01/26/2013   Dyspnea 01/26/2013   Alzheimer's disease ([place]) 01/26/2015   CHB (complete heart block) ([place]) 06/07/2015   Left ventricular systolic dysfunction without heart failure 05/11/2017   Hypercholesterolemia 05/11/2017   Heart block AV second degree 07/15/2017   Status post three vessel coronary artery bypass 07/15/2017   AAA (abdominal aortic aneurysm) without rupture ([place]) 01/09/2018   Ascending aortic aneurysm ([place]) 01/09/2018   GI bleed 06/05/2018   HTN (hypertension)    High cholesterol    Diverticulosis    Lower GI bleed    Acute blood loss anemia 06/08/2018   Pain due to onychomycosis of toenails of both feet 12/12/2018   Closed displaced intertrochanteric fracture of left femur ([place]) 12/25/2018   Closed intertrochanteric fracture of hip, left, initial encounter ([place]) 12/25/2018   Urinary urgency 01/01/2019   Candidiasis 01/01/2019   Hip fracture ([place]) 03/21/2020   DNR (do not resuscitate) 03/21/2020   Acquired thrombophilia ([place]) 03/23/2020   Right hip pain 04/04/2020   Depression, recurrent ([place]) 04/04/2020  Malaise 04/04/2020   CKD (chronic kidney disease) stage 3, GFR 30-59 ml/min (HCC) 04/06/2020   Weight loss 04/20/2020   Slow transit constipation 04/20/2020   GERD (gastroesophageal reflux disease) 04/22/2020   Cough 05/18/2020   Dysphagia 05/19/2020   Hip fracture requiring operative repair [facility]) 08/29/2020   Resolved Ambulatory Problems    Diagnosis Date Noted    Dementia with behavioral disturbance ([place]) 07/28/2013   Long term current use of anticoagulant 08/02/2016   Dementia arising in the senium and presenium ([place]) 07/15/2017   Dementia ([place])    CAD (coronary artery disease)    Atrial fibrillation (HCC)    Past Medical History:  Diagnosis Date   Atrial fibrillation, permanent ([place])     SOCIAL HX:  Social History   Tobacco Use   Smoking status: Former    Pack years: 0.00    Types: Cigarettes    Quit date: 07/02/1974    Years since quitting: 46.4   Smokeless tobacco: Never  Substance Use Topics   Alcohol use: Not Currently    Comment: last used in dec 2019     FAMILY HX:  Family History  Problem Relation Age of Onset   Stroke Father    Cervical cancer Other        siblings   COPD Sister    Dementia Brother       ALLERGIES:  Allergies  Allergen Reactions   Amiodarone Other (See Comments)    Had a severe lung infection in 2003   Antihistamines, Diphenhydramine-Type Other (See Comments)    Jittery   Lovastatin     Other reaction(s): NAUSEA,VOMITING   Other     Other reaction(s): NAUSEA,VOMITING Other reaction(s): NAUSEA,VOMITING      PERTINENT MEDICATIONS:  Outpatient Encounter Medications as of 12/15/2020  Medication Sig   acetaminophen (TYLENOL) 500 MG tablet Take 1 tablet (500 mg total) by mouth every 6 (six) hours as needed for mild pain.   aspirin EC 81 MG tablet Take 81 mg by mouth daily. Swallow whole.   atorvastatin (LIPITOR) 40 MG tablet Take 40 mg by mouth every evening.    Cholecalciferol (VITAMIN D) 2000 UNITS CAPS Take 2,000 Units by mouth at bedtime.    donepezil (ARICEPT) 5 MG tablet Take 1 tablet (5 mg total) by mouth 2 (two) times daily.   ferrous sulfate 325 (65 FE) MG EC tablet Take 325 mg by mouth daily.    finasteride (PROSCAR) 5 MG tablet Take 5 mg by mouth every evening.    lactose free nutrition (BOOST) LIQD Take 237 mLs by mouth daily. Add chocolate ice cream to make a milkshake    omeprazole (PRILOSEC) 20 MG capsule Take 20 mg by mouth daily.   polyethylene glycol (MIRALAX / GLYCOLAX) 17 g packet Take 17 g by mouth daily as needed for mild constipation.   sertraline (ZOLOFT) 25 MG tablet Take 1 tablet (25 mg total) by mouth daily.   No facility-administered encounter medications on file as of 12/15/2020.     Thank you for the opportunity to participate in the care of Mr. Tsou.  The palliative care team will continue to follow. Please call our office at [phone] if we can be of additional assistance.   Note: Portions of this note were generated with [employer]. Dictation errors may occur despite best attempts at proofreading.  Teodoro Spray, NP

## 2020-12-16 DIAGNOSIS — I10 Essential (primary) hypertension: Secondary | ICD-10-CM | POA: Diagnosis not present

## 2020-12-16 DIAGNOSIS — K219 Gastro-esophageal reflux disease without esophagitis: Secondary | ICD-10-CM | POA: Diagnosis not present

## 2020-12-16 DIAGNOSIS — G301 Alzheimer's disease with late onset: Secondary | ICD-10-CM | POA: Diagnosis not present

## 2020-12-16 DIAGNOSIS — E7800 Pure hypercholesterolemia, unspecified: Secondary | ICD-10-CM | POA: Diagnosis not present

## 2020-12-16 DIAGNOSIS — N1832 Chronic kidney disease, stage 3b: Secondary | ICD-10-CM | POA: Diagnosis not present

## 2020-12-16 DIAGNOSIS — N184 Chronic kidney disease, stage 4 (severe): Secondary | ICD-10-CM | POA: Diagnosis not present

## 2020-12-19 DIAGNOSIS — R310 Gross hematuria: Secondary | ICD-10-CM | POA: Diagnosis not present

## 2020-12-19 DIAGNOSIS — N184 Chronic kidney disease, stage 4 (severe): Secondary | ICD-10-CM | POA: Diagnosis not present

## 2020-12-19 DIAGNOSIS — I4819 Other persistent atrial fibrillation: Secondary | ICD-10-CM | POA: Diagnosis not present

## 2020-12-19 DIAGNOSIS — I10 Essential (primary) hypertension: Secondary | ICD-10-CM | POA: Diagnosis not present

## 2020-12-19 DIAGNOSIS — K219 Gastro-esophageal reflux disease without esophagitis: Secondary | ICD-10-CM | POA: Diagnosis not present

## 2020-12-27 DIAGNOSIS — L570 Actinic keratosis: Secondary | ICD-10-CM | POA: Diagnosis not present

## 2020-12-27 DIAGNOSIS — L821 Other seborrheic keratosis: Secondary | ICD-10-CM | POA: Diagnosis not present

## 2020-12-27 DIAGNOSIS — Z85828 Personal history of other malignant neoplasm of skin: Secondary | ICD-10-CM | POA: Diagnosis not present

## 2020-12-27 DIAGNOSIS — D1801 Hemangioma of skin and subcutaneous tissue: Secondary | ICD-10-CM | POA: Diagnosis not present

## 2020-12-27 DIAGNOSIS — L905 Scar conditions and fibrosis of skin: Secondary | ICD-10-CM | POA: Diagnosis not present

## 2020-12-27 DIAGNOSIS — L853 Xerosis cutis: Secondary | ICD-10-CM | POA: Diagnosis not present

## 2020-12-28 ENCOUNTER — Ambulatory Visit (INDEPENDENT_AMBULATORY_CARE_PROVIDER_SITE_OTHER): Payer: Medicare Other | Admitting: Podiatry

## 2020-12-28 ENCOUNTER — Other Ambulatory Visit: Payer: Medicare Other | Admitting: Hospice

## 2020-12-28 ENCOUNTER — Other Ambulatory Visit: Payer: Self-pay

## 2020-12-28 ENCOUNTER — Encounter: Payer: Self-pay | Admitting: Podiatry

## 2020-12-28 DIAGNOSIS — B351 Tinea unguium: Secondary | ICD-10-CM | POA: Diagnosis not present

## 2020-12-28 DIAGNOSIS — I259 Chronic ischemic heart disease, unspecified: Secondary | ICD-10-CM | POA: Insufficient documentation

## 2020-12-28 DIAGNOSIS — M79675 Pain in left toe(s): Secondary | ICD-10-CM

## 2020-12-28 DIAGNOSIS — F09 Unspecified mental disorder due to known physiological condition: Secondary | ICD-10-CM | POA: Insufficient documentation

## 2020-12-28 DIAGNOSIS — N1832 Chronic kidney disease, stage 3b: Secondary | ICD-10-CM

## 2020-12-28 DIAGNOSIS — Z8719 Personal history of other diseases of the digestive system: Secondary | ICD-10-CM | POA: Insufficient documentation

## 2020-12-28 DIAGNOSIS — N400 Enlarged prostate without lower urinary tract symptoms: Secondary | ICD-10-CM | POA: Insufficient documentation

## 2020-12-28 DIAGNOSIS — M48061 Spinal stenosis, lumbar region without neurogenic claudication: Secondary | ICD-10-CM | POA: Insufficient documentation

## 2020-12-28 DIAGNOSIS — J9589 Other postprocedural complications and disorders of respiratory system, not elsewhere classified: Secondary | ICD-10-CM | POA: Insufficient documentation

## 2020-12-28 DIAGNOSIS — K7030 Alcoholic cirrhosis of liver without ascites: Secondary | ICD-10-CM | POA: Insufficient documentation

## 2020-12-28 DIAGNOSIS — J309 Allergic rhinitis, unspecified: Secondary | ICD-10-CM | POA: Insufficient documentation

## 2020-12-28 DIAGNOSIS — M79674 Pain in right toe(s): Secondary | ICD-10-CM

## 2020-12-28 NOTE — Progress Notes (Signed)
This patient returns to the office for evaluation and treatment of long thick painful nails .  This patient is unable to trim his own nails since the patient cannot reach the feet.  Patient says the nails are painful walking and wearing his shoes.  He returns for preventive foot care services. He presents to the office with wife.  General Appearance  Alert, conversant and in no acute stress.  Vascular  Dorsalis pedis and posterior tibial  pulses are not   palpable  bilaterally.  Capillary return is within normal limits  Bilaterally  Cold feet  Bilaterally. Absent hair.  Neurologic  Senn-Weinstein monofilament wire test within normal limits  bilaterally. Muscle power within normal limits bilaterally.  Nails Thick disfigured discolored nails with subungual debris  from hallux to fifth toes bilaterally. No evidence of bacterial infection or drainage bilaterally.  Orthopedic  No limitations of motion  feet .  No crepitus or effusions noted.  No bony pathology or digital deformities noted.  Skin  normotropic skin with no porokeratosis noted bilaterally.  No signs of infections or ulcers noted.     Onychomycosis  Pain in toes right foot  Pain in toes left foot  Debridement  of nails  1-5  B/L with a nail nipper.  Nails were then filed using a dremel tool with no incidents.    RTC  10 weeks   Gardiner Barefoot DPM

## 2021-01-11 DIAGNOSIS — R310 Gross hematuria: Secondary | ICD-10-CM | POA: Diagnosis not present

## 2021-01-11 DIAGNOSIS — G301 Alzheimer's disease with late onset: Secondary | ICD-10-CM | POA: Diagnosis not present

## 2021-01-11 DIAGNOSIS — I10 Essential (primary) hypertension: Secondary | ICD-10-CM | POA: Diagnosis not present

## 2021-01-11 DIAGNOSIS — I4811 Longstanding persistent atrial fibrillation: Secondary | ICD-10-CM | POA: Diagnosis not present

## 2021-01-11 DIAGNOSIS — N184 Chronic kidney disease, stage 4 (severe): Secondary | ICD-10-CM | POA: Diagnosis not present

## 2021-01-13 DIAGNOSIS — N1330 Unspecified hydronephrosis: Secondary | ICD-10-CM | POA: Diagnosis not present

## 2021-01-13 DIAGNOSIS — N179 Acute kidney failure, unspecified: Secondary | ICD-10-CM | POA: Diagnosis not present

## 2021-01-20 ENCOUNTER — Encounter (HOSPITAL_COMMUNITY): Payer: Self-pay | Admitting: Internal Medicine

## 2021-01-20 ENCOUNTER — Inpatient Hospital Stay (HOSPITAL_COMMUNITY): Payer: Medicare Other

## 2021-01-20 ENCOUNTER — Inpatient Hospital Stay (HOSPITAL_COMMUNITY)
Admission: EM | Admit: 2021-01-20 | Discharge: 2021-01-29 | DRG: 987 | Disposition: A | Discharge status: Hospice | Payer: Medicare Other | Attending: Internal Medicine | Admitting: Internal Medicine

## 2021-01-20 DIAGNOSIS — N200 Calculus of kidney: Secondary | ICD-10-CM

## 2021-01-20 DIAGNOSIS — F32A Depression, unspecified: Secondary | ICD-10-CM | POA: Diagnosis present

## 2021-01-20 DIAGNOSIS — N202 Calculus of kidney with calculus of ureter: Secondary | ICD-10-CM | POA: Diagnosis present

## 2021-01-20 DIAGNOSIS — E43 Unspecified severe protein-calorie malnutrition: Secondary | ICD-10-CM | POA: Diagnosis not present

## 2021-01-20 DIAGNOSIS — E861 Hypovolemia: Secondary | ICD-10-CM | POA: Diagnosis present

## 2021-01-20 DIAGNOSIS — G9340 Encephalopathy, unspecified: Secondary | ICD-10-CM | POA: Diagnosis not present

## 2021-01-20 DIAGNOSIS — Z66 Do not resuscitate: Secondary | ICD-10-CM | POA: Diagnosis not present

## 2021-01-20 DIAGNOSIS — N400 Enlarged prostate without lower urinary tract symptoms: Secondary | ICD-10-CM | POA: Diagnosis present

## 2021-01-20 DIAGNOSIS — R6521 Severe sepsis with septic shock: Secondary | ICD-10-CM | POA: Diagnosis not present

## 2021-01-20 DIAGNOSIS — Z79899 Other long term (current) drug therapy: Secondary | ICD-10-CM

## 2021-01-20 DIAGNOSIS — R0902 Hypoxemia: Secondary | ICD-10-CM | POA: Diagnosis not present

## 2021-01-20 DIAGNOSIS — N3001 Acute cystitis with hematuria: Secondary | ICD-10-CM | POA: Diagnosis not present

## 2021-01-20 DIAGNOSIS — I4821 Permanent atrial fibrillation: Secondary | ICD-10-CM | POA: Diagnosis present

## 2021-01-20 DIAGNOSIS — F0281 Dementia in other diseases classified elsewhere with behavioral disturbance: Secondary | ICD-10-CM | POA: Diagnosis present

## 2021-01-20 DIAGNOSIS — N189 Chronic kidney disease, unspecified: Secondary | ICD-10-CM | POA: Diagnosis not present

## 2021-01-20 DIAGNOSIS — F0280 Dementia in other diseases classified elsewhere without behavioral disturbance: Secondary | ICD-10-CM | POA: Diagnosis not present

## 2021-01-20 DIAGNOSIS — I10 Essential (primary) hypertension: Secondary | ICD-10-CM | POA: Diagnosis not present

## 2021-01-20 DIAGNOSIS — I129 Hypertensive chronic kidney disease with stage 1 through stage 4 chronic kidney disease, or unspecified chronic kidney disease: Secondary | ICD-10-CM | POA: Diagnosis present

## 2021-01-20 DIAGNOSIS — D62 Acute posthemorrhagic anemia: Secondary | ICD-10-CM | POA: Diagnosis present

## 2021-01-20 DIAGNOSIS — E785 Hyperlipidemia, unspecified: Secondary | ICD-10-CM | POA: Diagnosis not present

## 2021-01-20 DIAGNOSIS — N1330 Unspecified hydronephrosis: Secondary | ICD-10-CM

## 2021-01-20 DIAGNOSIS — Z6820 Body mass index (BMI) 20.0-20.9, adult: Secondary | ICD-10-CM

## 2021-01-20 DIAGNOSIS — N179 Acute kidney failure, unspecified: Secondary | ICD-10-CM | POA: Diagnosis not present

## 2021-01-20 DIAGNOSIS — Z681 Body mass index (BMI) 19 or less, adult: Secondary | ICD-10-CM | POA: Diagnosis not present

## 2021-01-20 DIAGNOSIS — K921 Melena: Secondary | ICD-10-CM | POA: Diagnosis not present

## 2021-01-20 DIAGNOSIS — K5730 Diverticulosis of large intestine without perforation or abscess without bleeding: Secondary | ICD-10-CM | POA: Diagnosis not present

## 2021-01-20 DIAGNOSIS — N3289 Other specified disorders of bladder: Secondary | ICD-10-CM | POA: Diagnosis not present

## 2021-01-20 DIAGNOSIS — K219 Gastro-esophageal reflux disease without esophagitis: Secondary | ICD-10-CM | POA: Diagnosis present

## 2021-01-20 DIAGNOSIS — N1339 Other hydronephrosis: Secondary | ICD-10-CM | POA: Diagnosis not present

## 2021-01-20 DIAGNOSIS — K922 Gastrointestinal hemorrhage, unspecified: Secondary | ICD-10-CM | POA: Diagnosis not present

## 2021-01-20 DIAGNOSIS — E872 Acidosis: Secondary | ICD-10-CM | POA: Diagnosis not present

## 2021-01-20 DIAGNOSIS — D539 Nutritional anemia, unspecified: Secondary | ICD-10-CM | POA: Insufficient documentation

## 2021-01-20 DIAGNOSIS — N390 Urinary tract infection, site not specified: Secondary | ICD-10-CM | POA: Diagnosis not present

## 2021-01-20 DIAGNOSIS — E7800 Pure hypercholesterolemia, unspecified: Secondary | ICD-10-CM | POA: Diagnosis not present

## 2021-01-20 DIAGNOSIS — Z20822 Contact with and (suspected) exposure to covid-19: Secondary | ICD-10-CM | POA: Diagnosis not present

## 2021-01-20 DIAGNOSIS — R54 Age-related physical debility: Secondary | ICD-10-CM | POA: Diagnosis present

## 2021-01-20 DIAGNOSIS — Z823 Family history of stroke: Secondary | ICD-10-CM

## 2021-01-20 DIAGNOSIS — I4891 Unspecified atrial fibrillation: Secondary | ICD-10-CM | POA: Diagnosis not present

## 2021-01-20 DIAGNOSIS — Z7982 Long term (current) use of aspirin: Secondary | ICD-10-CM

## 2021-01-20 DIAGNOSIS — R0689 Other abnormalities of breathing: Secondary | ICD-10-CM | POA: Diagnosis not present

## 2021-01-20 DIAGNOSIS — Z7189 Other specified counseling: Secondary | ICD-10-CM | POA: Diagnosis not present

## 2021-01-20 DIAGNOSIS — R2681 Unsteadiness on feet: Secondary | ICD-10-CM | POA: Diagnosis present

## 2021-01-20 DIAGNOSIS — Z888 Allergy status to other drugs, medicaments and biological substances status: Secondary | ICD-10-CM

## 2021-01-20 DIAGNOSIS — Z515 Encounter for palliative care: Secondary | ICD-10-CM | POA: Diagnosis not present

## 2021-01-20 DIAGNOSIS — I959 Hypotension, unspecified: Secondary | ICD-10-CM | POA: Diagnosis not present

## 2021-01-20 DIAGNOSIS — G9341 Metabolic encephalopathy: Secondary | ICD-10-CM | POA: Diagnosis not present

## 2021-01-20 DIAGNOSIS — I714 Abdominal aortic aneurysm, without rupture: Secondary | ICD-10-CM | POA: Diagnosis present

## 2021-01-20 DIAGNOSIS — N136 Pyonephrosis: Secondary | ICD-10-CM | POA: Diagnosis present

## 2021-01-20 DIAGNOSIS — R64 Cachexia: Secondary | ICD-10-CM | POA: Diagnosis present

## 2021-01-20 DIAGNOSIS — Z87891 Personal history of nicotine dependence: Secondary | ICD-10-CM

## 2021-01-20 DIAGNOSIS — I700 Atherosclerosis of aorta: Secondary | ICD-10-CM | POA: Diagnosis present

## 2021-01-20 DIAGNOSIS — R58 Hemorrhage, not elsewhere classified: Secondary | ICD-10-CM | POA: Diagnosis not present

## 2021-01-20 DIAGNOSIS — Z87442 Personal history of urinary calculi: Secondary | ICD-10-CM

## 2021-01-20 DIAGNOSIS — I442 Atrioventricular block, complete: Secondary | ICD-10-CM | POA: Diagnosis not present

## 2021-01-20 DIAGNOSIS — Z466 Encounter for fitting and adjustment of urinary device: Secondary | ICD-10-CM | POA: Diagnosis not present

## 2021-01-20 DIAGNOSIS — D649 Anemia, unspecified: Secondary | ICD-10-CM | POA: Diagnosis not present

## 2021-01-20 DIAGNOSIS — F0390 Unspecified dementia without behavioral disturbance: Secondary | ICD-10-CM | POA: Diagnosis not present

## 2021-01-20 DIAGNOSIS — N1832 Chronic kidney disease, stage 3b: Secondary | ICD-10-CM | POA: Diagnosis present

## 2021-01-20 DIAGNOSIS — K5790 Diverticulosis of intestine, part unspecified, without perforation or abscess without bleeding: Secondary | ICD-10-CM | POA: Diagnosis not present

## 2021-01-20 DIAGNOSIS — A419 Sepsis, unspecified organism: Secondary | ICD-10-CM | POA: Diagnosis not present

## 2021-01-20 DIAGNOSIS — R410 Disorientation, unspecified: Secondary | ICD-10-CM | POA: Diagnosis not present

## 2021-01-20 DIAGNOSIS — I2510 Atherosclerotic heart disease of native coronary artery without angina pectoris: Secondary | ICD-10-CM | POA: Diagnosis not present

## 2021-01-20 DIAGNOSIS — G309 Alzheimer's disease, unspecified: Secondary | ICD-10-CM | POA: Diagnosis present

## 2021-01-20 DIAGNOSIS — N132 Hydronephrosis with renal and ureteral calculous obstruction: Secondary | ICD-10-CM | POA: Diagnosis not present

## 2021-01-20 DIAGNOSIS — N320 Bladder-neck obstruction: Secondary | ICD-10-CM | POA: Diagnosis present

## 2021-01-20 DIAGNOSIS — Z7401 Bed confinement status: Secondary | ICD-10-CM | POA: Diagnosis not present

## 2021-01-20 DIAGNOSIS — K5731 Diverticulosis of large intestine without perforation or abscess with bleeding: Principal | ICD-10-CM | POA: Diagnosis present

## 2021-01-20 DIAGNOSIS — Z825 Family history of asthma and other chronic lower respiratory diseases: Secondary | ICD-10-CM

## 2021-01-20 DIAGNOSIS — R197 Diarrhea, unspecified: Secondary | ICD-10-CM | POA: Diagnosis not present

## 2021-01-20 LAB — URINALYSIS, ROUTINE W REFLEX MICROSCOPIC
Bilirubin Urine: NEGATIVE
Glucose, UA: NEGATIVE mg/dL
Ketones, ur: NEGATIVE mg/dL
Nitrite: NEGATIVE
Protein, ur: 100 mg/dL — AB
Specific Gravity, Urine: 1.012 (ref 1.005–1.030)
WBC, UA: >50 WBC/hpf — ABNORMAL HIGH (ref 0–5)
pH: 5 (ref 5.0–8.0)

## 2021-01-20 LAB — CBC WITH DIFFERENTIAL/PLATELET
Abs Immature Granulocytes: 0.03 10*3/uL (ref 0.00–0.07)
Basophils Absolute: 0 10*3/uL (ref 0.0–0.1)
Basophils Relative: 0 %
Eosinophils Absolute: 0 10*3/uL (ref 0.0–0.5)
Eosinophils Relative: 0 %
HCT: 22.8 % — ABNORMAL LOW (ref 39.0–52.0)
Hemoglobin: 7.2 g/dL — ABNORMAL LOW (ref 13.0–17.0)
Immature Granulocytes: 0 %
Lymphocytes Relative: 12 %
Lymphs Abs: 0.9 10*3/uL (ref 0.7–4.0)
MCH: 32.7 pg (ref 26.0–34.0)
MCHC: 31.6 g/dL (ref 30.0–36.0)
MCV: 103.6 fL — ABNORMAL HIGH (ref 80.0–100.0)
Monocytes Absolute: 0.7 10*3/uL (ref 0.1–1.0)
Monocytes Relative: 9 %
Neutro Abs: 5.5 10*3/uL (ref 1.7–7.7)
Neutrophils Relative %: 79 %
Platelets: 212 10*3/uL (ref 150–400)
RBC: 2.2 MIL/uL — ABNORMAL LOW (ref 4.22–5.81)
RDW: 15.7 % — ABNORMAL HIGH (ref 11.5–15.5)
WBC: 7 10*3/uL (ref 4.0–10.5)
nRBC: 0 % (ref 0.0–0.2)

## 2021-01-20 LAB — COMPREHENSIVE METABOLIC PANEL
ALT: 13 U/L (ref 0–44)
AST: 21 U/L (ref 15–41)
Albumin: 2.6 g/dL — ABNORMAL LOW (ref 3.5–5.0)
Alkaline Phosphatase: 71 U/L (ref 38–126)
Anion gap: 13 (ref 5–15)
BUN: 108 mg/dL — ABNORMAL HIGH (ref 8–23)
CO2: 15 mmol/L — ABNORMAL LOW (ref 22–32)
Calcium: 9 mg/dL (ref 8.9–10.3)
Chloride: 107 mmol/L (ref 98–111)
Creatinine, Ser: 3.4 mg/dL — ABNORMAL HIGH (ref 0.61–1.24)
GFR, Estimated: 16 mL/min — ABNORMAL LOW (ref >60)
Glucose, Bld: 126 mg/dL — ABNORMAL HIGH (ref 70–99)
Potassium: 4.8 mmol/L (ref 3.5–5.1)
Sodium: 135 mmol/L (ref 135–145)
Total Bilirubin: 0.2 mg/dL — ABNORMAL LOW (ref 0.3–1.2)
Total Protein: 6.4 g/dL — ABNORMAL LOW (ref 6.5–8.1)

## 2021-01-20 LAB — POC OCCULT BLOOD, ED: Fecal Occult Bld: POSITIVE — AB

## 2021-01-20 LAB — LIPASE, BLOOD: Lipase: 44 U/L (ref 11–51)

## 2021-01-20 LAB — PREPARE RBC (CROSSMATCH)

## 2021-01-20 MED ORDER — ACETAMINOPHEN 650 MG RE SUPP: 650.0000 mg | Freq: Four times a day (QID) | RECTAL | Status: DC | PRN

## 2021-01-20 MED ORDER — ONDANSETRON HCL 4 MG PO TABS: 4.0000 mg | ORAL_TABLET | Freq: Four times a day (QID) | ORAL | Status: DC | PRN

## 2021-01-20 MED ORDER — SODIUM CHLORIDE 0.9 % IV SOLN
1.0000 g | Freq: Once | INTRAVENOUS | Status: AC
  Administered 2021-01-20: 1 g via INTRAVENOUS
  Filled 2021-01-20: qty 10

## 2021-01-20 MED ORDER — SODIUM CHLORIDE 0.9 % IV BOLUS
1000.0000 mL | Freq: Once | INTRAVENOUS | Status: AC
  Administered 2021-01-21: 1000 mL via INTRAVENOUS

## 2021-01-20 MED ORDER — PANTOPRAZOLE SODIUM 40 MG IV SOLR
40.0000 mg | Freq: Two times a day (BID) | INTRAVENOUS | Status: DC
  Administered 2021-01-21 – 2021-01-24 (×7): 40 mg via INTRAVENOUS
  Filled 2021-01-20 (×7): qty 40

## 2021-01-20 MED ORDER — ACETAMINOPHEN 325 MG PO TABS
650.0000 mg | ORAL_TABLET | Freq: Four times a day (QID) | ORAL | Status: DC | PRN
  Administered 2021-01-21 – 2021-01-28 (×2): 650 mg via ORAL
  Filled 2021-01-20 (×2): qty 2

## 2021-01-20 MED ORDER — SODIUM CHLORIDE 0.9 % IV SOLN
10.0000 mL/h | Freq: Once | INTRAVENOUS | Status: AC
  Administered 2021-01-21: 10 mL/h via INTRAVENOUS

## 2021-01-20 MED ORDER — ONDANSETRON HCL 4 MG/2ML IJ SOLN: 4.0000 mg | Freq: Four times a day (QID) | INTRAMUSCULAR | Status: DC | PRN

## 2021-01-20 MED ORDER — SODIUM CHLORIDE 0.9 % IV BOLUS
500.0000 mL | Freq: Once | INTRAVENOUS | Status: AC
  Administered 2021-01-20: 500 mL via INTRAVENOUS

## 2021-01-20 NOTE — ED Triage Notes (Signed)
Pt bib ems from home with c/o diarrhea for 24 hours with dark stools. PMH GI bleed and diverticulitis. Pt has advanced dementia . EMS stated family believes he has a large kidney stone. No thinners  Afib  Pacemaker  Pt received 250 bolus in route.  BP 96/64 HR 60 Cbg 173

## 2021-01-20 NOTE — ED Provider Notes (Signed)
[facility] EMERGENCY DEPARTMENT Provider Note   CSN: [id number] Arrival date & time: 01/20/21  1955     History Chief Complaint  Patient presents with   Rectal Bleeding    [name] [name] is a 87 y.o. male.  87 year old male with history of dementia, permanent A. fib (not on thinners due to unsteady gait and prior GI bleed), diverticulosis, hypertension, hyperlipidemia, CAD, CKD, with pacemaker, brought in by EMS from home with concern for GI bleed.  Patient is a poor historian, level 5 caveat applies.  History is provided largely by patient's wife who is at bedside.  Patient reports feeling fine and would like to get out of bed and go home today.  Patient's wife states that she noticed diarrhea this morning with large output of stool, treated at home with Kaopectate however he later developed red and dark stools which concerned her due to his prior GI bleed history and prompted her to call 911 for transport to the hospital.  States patient has not been eating per usual today, has only been drinking ginger ale.  History of anemia however does feel his complexion is more pale today than usual.  Reports 5-6 loose stools total today. Deviously seen by [facility].      Past Medical History:  Diagnosis Date   Atrial fibrillation, permanent ([place])    no longer on AC due to GI bleed   CAD (coronary artery disease)    Dementia (HCC)    Diverticulosis    diverticular bleed 2011   High cholesterol    HTN (hypertension)    has improved over time, no rx as of 2019   Lower GI bleed     Patient Active Problem List   Diagnosis Date Noted   Acute GI bleeding 17/51/0258   Alcoholic cirrhosis ([place]) 52/77/8242   Allergic rhinitis 12/28/2020   Chronic ischemic heart disease 12/28/2020   History of gastrointestinal disease 12/28/2020   Hypertrophy of prostate without urinary obstruction and other lower urinary tract symptoms (LUTS) 12/28/2020   Pulmonary insufficiency following  trauma and surgery 12/28/2020   Spinal stenosis of lumbar region without neurogenic claudication 12/28/2020   Unspecified persistent mental disorders due to conditions classified elsewhere 12/28/2020   Hip fracture requiring operative repair ([place]) 08/29/2020   Dysphagia 05/19/2020   Cough 05/18/2020   GERD (gastroesophageal reflux disease) 04/22/2020   Weight loss 04/20/2020   Slow transit constipation 04/20/2020   CKD (chronic kidney disease) stage 3, GFR 30-59 ml/min ([place]) 04/06/2020   Right hip pain 04/04/2020   Depression, recurrent ([place]) 04/04/2020   Malaise 04/04/2020   Acquired thrombophilia ([place]) 03/23/2020   Hip fracture ([place]) 03/21/2020   DNR (do not resuscitate) 03/21/2020   Urinary urgency 01/01/2019   Candidiasis 01/01/2019   Closed displaced intertrochanteric fracture of left femur ([place]) 12/25/2018   Closed intertrochanteric fracture of hip, left, initial encounter ([place]) 12/25/2018   Pain due to onychomycosis of toenails of both feet 12/12/2018   Acute blood loss anemia 06/08/2018   GI bleed 06/05/2018   HTN (hypertension)    High cholesterol    Diverticulosis    Lower GI bleed    AAA (abdominal aortic aneurysm) without rupture ([place]) 01/09/2018   Ascending aortic aneurysm ([place]) 01/09/2018   Heart block AV second degree 07/15/2017   Status post three vessel coronary artery bypass 07/15/2017   Left ventricular systolic dysfunction without heart failure 05/11/2017   Hypercholesterolemia 05/11/2017   CHB (complete heart block) ([place]) 06/07/2015  Alzheimer's disease ([place]) 01/26/2015   Pacemaker 01/26/2013   Permanent atrial fibrillation ([place]) 01/26/2013   Second degree AV block, Mobitz type II 01/26/2013   Coronary artery disease involving native coronary artery of native heart without angina pectoris 01/26/2013   Dyspnea 01/26/2013    Past Surgical History:  Procedure Laterality Date   angiolplasty     APPENDECTOMY     INSERT / REPLACE / REMOVE PACEMAKER      INTRAMEDULLARY (IM) NAIL INTERTROCHANTERIC Left 12/26/2018   Procedure: INTRAMEDULLARY (IM) NAIL INTERTROCHANTRIC;  Surgeon: Leandrew Koyanagi, MD;  Location: [facility];  Service: Orthopedics;  Laterality: Left;   INTRAMEDULLARY (IM) NAIL INTERTROCHANTERIC Right 03/22/2020   Procedure: INTRAMEDULLARY (IM) NAIL INTERTROCHANTRIC;  Surgeon: Newt Minion, MD;  Location: [facility];  Service: Orthopedics;  Laterality: Right;   KNEE SURGERY     PILONIDAL CYST EXCISION     rotator cuff surgery  1985   TONSILLECTOMY     triple heart bypass         Family History  Problem Relation Age of Onset   Stroke Father    Cervical cancer Other        siblings   COPD Sister    Dementia Brother     Social History   Tobacco Use   Smoking status: Former    Types: Cigarettes    Quit date: 07/02/1974    Years since quitting: 46.5   Smokeless tobacco: Never  Vaping Use   Vaping Use: Never used  Substance Use Topics   Alcohol use: Not Currently    Comment: last used in dec 2019   Drug use: No    Home Medications Prior to Admission medications   Medication Sig Start Date End Date Taking? Authorizing Provider  acetaminophen (TYLENOL) 500 MG tablet Take 1 tablet (500 mg total) by mouth every 6 (six) hours as needed for mild pain. 03/22/20   Newt Minion, MD  aspirin EC 81 MG tablet Take 81 mg by mouth daily. Swallow whole.    [provider]  atorvastatin (LIPITOR) 40 MG tablet Take 40 mg by mouth every evening.     [provider]  Cholecalciferol (VITAMIN D) 2000 UNITS CAPS Take 2,000 Units by mouth at bedtime.     [provider]  donepezil (ARICEPT) 5 MG tablet Take 1 tablet (5 mg total) by mouth 2 (two) times daily. 08/29/20   Dohmeier, Asencion Partridge, MD  ferrous sulfate 325 (65 FE) MG EC tablet Take 325 mg by mouth daily.     [provider]  finasteride (PROSCAR) 5 MG tablet Take 5 mg by mouth every evening.     [provider]  lactose free nutrition (BOOST) LIQD Take  237 mLs by mouth daily. Add chocolate ice cream to make a milkshake    [provider]  omeprazole (PRILOSEC) 20 MG capsule Take 20 mg by mouth daily.    [provider]  polyethylene glycol (MIRALAX / GLYCOLAX) 17 g packet Take 17 g by mouth daily as needed for mild constipation. 03/24/20   Donne Hazel, MD  sertraline (ZOLOFT) 25 MG tablet Take 1 tablet (25 mg total) by mouth daily. 08/29/20   Dohmeier, Asencion Partridge, MD  sulfamethoxazole-trimethoprim (BACTRIM) 400-80 MG tablet 1 tablet 12/19/20   [provider]    Allergies    Amiodarone; Antihistamines, diphenhydramine-type; Lovastatin; and Other  Review of Systems   Review of Systems  Unable to perform ROS: Dementia  Gastrointestinal:  Positive for blood  in stool and diarrhea. Negative for abdominal pain.   Physical Exam Updated Vital Signs BP (!) 110/57   Pulse (!) 53   Temp (!) 97.4 F (36.3 C) (Oral)   Resp (!) 25   SpO2 93%   Physical Exam Vitals and nursing note reviewed. Exam conducted with a chaperone present.  Constitutional:      General: He is not in acute distress.    Appearance: He is well-developed. He is not diaphoretic.  HENT:     Head: Normocephalic and atraumatic.     Nose: Nose normal.     Mouth/Throat:     Mouth: Mucous membranes are moist.  Cardiovascular:     Rate and Rhythm: Normal rate and regular rhythm.     Heart sounds: Normal heart sounds.  Pulmonary:     Effort: Pulmonary effort is normal.     Breath sounds: Normal breath sounds.  Abdominal:     Palpations: Abdomen is soft.     Tenderness: There is no abdominal tenderness.  Genitourinary:    Rectum: Guaiac result positive.     Comments: Stool dark red Musculoskeletal:     Cervical back: Neck supple.     Right lower leg: No edema.     Left lower leg: No edema.  Skin:    General: Skin is warm and dry.     Coloration: Skin is pale.  Neurological:     Mental Status: He is alert. Mental status is at baseline.   Psychiatric:        Behavior: Behavior normal.    ED Results / Procedures / Treatments   Labs (all labs ordered are listed, but only abnormal results are displayed) Labs Reviewed  CBC WITH DIFFERENTIAL/PLATELET - Abnormal; Notable for the following components:      Result Value   RBC 2.20 (*)    Hemoglobin 7.2 (*)    HCT 22.8 (*)    MCV 103.6 (*)    RDW 15.7 (*)    All other components within normal limits  COMPREHENSIVE METABOLIC PANEL - Abnormal; Notable for the following components:   CO2 15 (*)    Glucose, Bld 126 (*)    BUN 108 (*)    Creatinine, Ser 3.40 (*)    Total Protein 6.4 (*)    Albumin 2.6 (*)    Total Bilirubin 0.2 (*)    GFR, Estimated 16 (*)    All other components within normal limits  URINALYSIS, ROUTINE W REFLEX MICROSCOPIC - Abnormal; Notable for the following components:   Color, Urine AMBER (*)    APPearance TURBID (*)    Hgb urine dipstick SMALL (*)    Protein, ur 100 (*)    Leukocytes,Ua LARGE (*)    WBC, UA >50 (*)    Bacteria, UA MANY (*)    All other components within normal limits  POC OCCULT BLOOD, ED - Abnormal; Notable for the following components:   Fecal Occult Bld POSITIVE (*)    All other components within normal limits  RESP PANEL BY RT-PCR (FLU A&B, COVID) ARPGX2  URINE CULTURE  LIPASE, BLOOD  TYPE AND SCREEN  PREPARE RBC (CROSSMATCH)    EKG None  Radiology No results found.  Procedures .Critical Care  Date/Time: 01/20/2021 10:01 PM Performed by: Tacy Learn, PA-C Authorized by: Tacy Learn, PA-C   Critical care provider statement:    Critical care time (minutes):  45   Critical care was time spent personally by me on the following activities:  Discussions with consultants, evaluation of patient's response to treatment, examination of patient, ordering and performing treatments and interventions, ordering and review of laboratory studies, ordering and review of radiographic studies, pulse oximetry,  re-evaluation of patient's condition, obtaining history from patient or surrogate and review of old charts   Medications Ordered in ED Medications  0.9 %  sodium chloride infusion (has no administration in time range)  sodium chloride 0.9 % bolus 500 mL (has no administration in time range)  cefTRIAXone (ROCEPHIN) 1 g in sodium chloride 0.9 % 100 mL IVPB (has no administration in time range)    ED Course  I have reviewed the triage vital signs and the nursing notes.  Pertinent labs & imaging results that were available during my care of the patient were reviewed by me and considered in my medical decision making (see chart for details).  Clinical Course as of 01/20/21 2236  Fri Jan 21, 2855  6170 87 year old male with dementia, report of large amount of loose stool output this morning per wife/caretaker, mid day noticed bright red as well as black stools and came in for concern for GI bleed. No abdominal pain, abdomen is soft and non tender, no fevers, normal WBC. Hemoccult + with dark red stools. Hgb 7.2 (previously 8.6 in 05/2020, hx of iron deficient anemia). Ordered 1 unit packed red blood cells, patient and wife consent to transfusion.   CMP with AKI- Cr 3.4 (previously 1.3 on 05/2020 labs). Given additional IV fluids although wife reports CHF history, prior echo with EF 55-60%.  Vitals with occasional soft pressures in the upper 80H systolic, most recently 212/24. [LM]  2200 Case discussed with Dr. Olevia Bowens with [facility] hospitalist service who will consult for admission. [LM]    Clinical Course User Index [LM] Roque Lias   MDM Rules/Calculators/A&P                            Final Clinical Impression(s) / ED Diagnoses Final diagnoses:  Acute GI bleeding  Anemia, unspecified type  AKI (acute kidney injury) ([place])  Acute cystitis with hematuria    Rx / DC Orders ED Discharge Orders     None        Roque Lias 01/20/21 2236    Blanchie Dessert,  MD 01/20/21 2333

## 2021-01-20 NOTE — H&P (Signed)
History and Physical    [name] [name] PIR:[id number] DOB: 06-Jun-1926 DOA: 01/20/2021  PCP: [name], MD   Patient coming from: Home.  I have personally briefly reviewed patient's old medical records in [place]  Chief Complaint: Diarrhea and dark stools.  HPI: [name] [name] is a 87 y.o. male with medical history significant of permanent atrial fibrillation no longer on anticoagulation due to previous GI bleed history, CAD, unspecified dementia, diverticulosis with history of diverticular bleed in 2011, hyperlipidemia, hypertension who was brought via EMS to the emergency department with a history of 24 hours of diarrhea and dark stools.  He has a history of dementia and is able to answer simple questions.  He denied headache, chest pain, back or abdominal pain at the time of examination.  He stated he was feeling better.  ED Course: Initial vital signs were temperature 97.4 F, pulse 60, respiration 19, BP 115/70 mmHg O2 sat 100% on room air.  The patient received a gram of ceftriaxone and 1500 mL of NS bolus.  He was transfused a unit of PRBC.  Lab work: His urinalysis shows small hemoglobinuria, proteinuria 100 mg/dL, large leukocyte esterase, WBC of more than 50 and many bacteria.  CBC showed a white count 7.0, hemoglobin 7.2 g/dL and platelets 212.  Lipase was normal. CMP showed a CO2 of 15 mmol/L.  The rest of the electrolytes were normal.  Anion gap was 13.  Glucose was 126, BUN 180 and creatinine 3.40 mg/dL.  His previous creatinine level was 1.3 mg/dL in November 2021.  Total protein 6.4 and albumin 2.6 g/dL.  Rest of LFTs are unremarkable.  Review of Systems: As per HPI otherwise all other systems reviewed and are negative.  Past Medical History:  Diagnosis Date   Atrial fibrillation, permanent (HCC)    no longer on AC due to GI bleed   CAD (coronary artery disease)    Dementia (HCC)    Diverticulosis    diverticular bleed 2011   High cholesterol    HTN  (hypertension)    has improved over time, no rx as of 2019   Lower GI bleed     Past Surgical History:  Procedure Laterality Date   angiolplasty     APPENDECTOMY     INSERT / REPLACE / REMOVE PACEMAKER     INTRAMEDULLARY (IM) NAIL INTERTROCHANTERIC Left 12/26/2018   Procedure: INTRAMEDULLARY (IM) NAIL INTERTROCHANTRIC;  Surgeon: Leandrew Koyanagi, MD;  Location: [facility];  Service: Orthopedics;  Laterality: Left;   INTRAMEDULLARY (IM) NAIL INTERTROCHANTERIC Right 03/22/2020   Procedure: INTRAMEDULLARY (IM) NAIL INTERTROCHANTRIC;  Surgeon: Newt Minion, MD;  Location: [facility];  Service: Orthopedics;  Laterality: Right;   KNEE SURGERY     PILONIDAL CYST EXCISION     rotator cuff surgery  1985   TONSILLECTOMY     triple heart bypass     Social History  reports that he quit smoking about 46 years ago. He has never used smokeless tobacco. He reports previous alcohol use. He reports that he does not use drugs.  Allergies  Allergen Reactions   Amiodarone Other (See Comments)    Had a severe lung infection in 2003   Antihistamines, Diphenhydramine-Type Other (See Comments)    Jittery   Lovastatin     Other reaction(s): NAUSEA,VOMITING   Other     Other reaction(s): NAUSEA,VOMITING Other reaction(s): NAUSEA,VOMITING   Family History  Problem Relation Age of Onset   Stroke Father    Cervical  cancer Other        siblings   COPD Sister    Dementia Brother    Prior to Admission medications   Medication Sig Start Date End Date Taking? Authorizing Provider  acetaminophen (TYLENOL) 500 MG tablet Take 1 tablet (500 mg total) by mouth every 6 (six) hours as needed for mild pain. 03/22/20   Newt Minion, MD  aspirin EC 81 MG tablet Take 81 mg by mouth daily. Swallow whole.    [provider]  atorvastatin (LIPITOR) 40 MG tablet Take 40 mg by mouth every evening.     [provider]  Cholecalciferol (VITAMIN D) 2000 UNITS CAPS Take 2,000 Units by mouth at bedtime.     [provider]  donepezil (ARICEPT) 5 MG tablet Take 1 tablet (5 mg total) by mouth 2 (two) times daily. 08/29/20   Dohmeier, Asencion Partridge, MD  ferrous sulfate 325 (65 FE) MG EC tablet Take 325 mg by mouth daily.     [provider]  finasteride (PROSCAR) 5 MG tablet Take 5 mg by mouth every evening.     [provider]  lactose free nutrition (BOOST) LIQD Take 237 mLs by mouth daily. Add chocolate ice cream to make a milkshake    [provider]  omeprazole (PRILOSEC) 20 MG capsule Take 20 mg by mouth daily.    [provider]  polyethylene glycol (MIRALAX / GLYCOLAX) 17 g packet Take 17 g by mouth daily as needed for mild constipation. 03/24/20   Donne Hazel, MD  sertraline (ZOLOFT) 25 MG tablet Take 1 tablet (25 mg total) by mouth daily. 08/29/20   Dohmeier, Asencion Partridge, MD  sulfamethoxazole-trimethoprim (BACTRIM) 400-80 MG tablet 1 tablet 12/19/20   [provider]   Physical Exam: Vitals:   01/20/21 2100 01/20/21 2115 01/20/21 2130 01/20/21 2240  BP: 111/66 (!) 135/58 (!) 110/57 91/61  Pulse:   (!) 53 (!) 105  Resp: 16 14 (!) 25 20  Temp:    (!) 97.5 F (36.4 C)  TempSrc:    Oral  SpO2:   93% 93%   Constitutional: Frail, elderly male.  NAD, calm, comfortable Eyes: PERRL, lids and conjunctivae are pale. Sclera is mildly injected. ENMT: Mucous membranes are moist. Posterior pharynx clear of any exudate or lesions.  Neck: normal, supple, no masses, no thyromegaly Respiratory: clear to auscultation bilaterally, no wheezing, no crackles. Normal respiratory effort. No accessory muscle use.  Cardiovascular: Bradycardic at 58 bpm, no murmurs / rubs / gallops. No extremity edema. 2+ pedal pulses. No carotid bruits.  Abdomen: No distention.  Bowel sounds positive.  Soft, no tenderness, no masses palpated. No hepatosplenomegaly. ED rectal exam positive for direct stool and guaiac positivity. Musculoskeletal: no clubbing / cyanosis. Good ROM, no contractures.  Normal muscle tone.  Skin: no acute rashes, lesions, ulcers on very limited otological examination. Neurologic: CN 2-12 grossly intact. Sensation intact, DTR normal. Strength 5/5 in all 4.  Psychiatric: Normal judgment and insight. Alert and oriented x 3. Normal mood.   Labs on Admission: I have personally reviewed following labs and imaging studies  CBC: Recent Labs  Lab 01/20/21 2035  WBC 7.0  NEUTROABS 5.5  HGB 7.2*  HCT 22.8*  MCV 103.6*  PLT 474    Basic Metabolic Panel: Recent Labs  Lab 01/20/21 2035  NA 135  K 4.8  CL 107  CO2 15*  GLUCOSE 126*  BUN 108*  CREATININE 3.40*  CALCIUM 9.0    GFR: CrCl  cannot be calculated (Unknown ideal weight.).  Liver Function Tests: Recent Labs  Lab 01/20/21 2035  AST 21  ALT 13  ALKPHOS 71  BILITOT 0.2*  PROT 6.4*  ALBUMIN 2.6*    Urine analysis:    Component Value Date/Time   COLORURINE AMBER (A) 01/20/2021 2202   APPEARANCEUR TURBID (A) 01/20/2021 2202   LABSPEC 1.012 01/20/2021 2202   PHURINE 5.0 01/20/2021 2202   GLUCOSEU NEGATIVE 01/20/2021 2202   HGBUR SMALL (A) 01/20/2021 2202   BILIRUBINUR NEGATIVE 01/20/2021 2202   KETONESUR NEGATIVE 01/20/2021 2202   PROTEINUR 100 (A) 01/20/2021 2202        NITRITE NEGATIVE 01/20/2021 2202   LEUKOCYTESUR LARGE (A) 01/20/2021 2202   Radiological Exams on Admission: No results found.  Echocardiogram 01/21/2018. -------------------------------------------------------------------  Indications:      R01.1 Murmur.   -------------------------------------------------------------------  Study Conclusions   - Left ventricle: The cavity size was normal. Wall thickness was    increased in a pattern of mild LVH. Systolic function was normal.    The estimated ejection fraction was in the range of 50% to 55%.    Wall motion was dyssynchronous but otherwise grossly normal. The    study is not technically sufficient to allow evaluation of LV    diastolic function due to  paced rhythm with lack of atrial    contraction.  - Ventricular septum: Septal motion showed abnormal function and    mild dyssynergy. These changes are consistent with right    ventricular pacing.  - Aortic valve: Sclerosis without stenosis. There was no    significant regurgitation.  - Mitral valve: There was trivial regurgitation.  - Left atrium: The atrium was mildly dilated.  - Right ventricle: Systolic function was mildly reduced.  - Atrial septum: No defect or patent foramen ovale was identified.  - Tricuspid valve: There was mild regurgitation.  - Pulmonic valve: There was no significant regurgitation.  - Pulmonary arteries: Systolic pressure was mildly increased. PA    peak pressure: 36 mm Hg (S).   Impressions:   - Dyssynchronous LV contraction, likely due to RV pacing. Normal LV    EF. Aortic sclerosis without stenosis, trivial MR, mild TR.   EKG: Independently reviewed.   Assessment/Plan Principal Problem:   Acute GI bleeding   Macrocytic acute blood loss anemia Observation/progressive unit. Keep NPO. Pantoprazole 40 mg IVP every 12 hours. Monitor hematocrit and hemoglobin. Transfuse as needed. GI has been consulted.  Active Problems:   Acute kidney injury superimposed on CKD ([place]) Secondary to hypovolemia and poor oral intake. Continue IV fluids and blood transfusion as needed.    Urinary tract infection Continue ceftriaxone 1 g every 24 hours per Follow-up urine culture and sensitivity.    BPH (benign prostatic hyperplasia) Continue Proscar 5 mg p.o. daily once cleared for oral intake.    Coronary artery disease Currently NPO. On aspirin atorvastatin.    Alzheimer's disease ([place]) Continue donezepil 5 mg p.o. twice daily.    Hypercholesterolemia On atorvastatin 10 mg p.o. daily.    HTN (hypertension) Off antihypertensives. He was hypotensive on presentation. Monitor blood pressure.     DVT prophylaxis: SCDs. Code Status:   DNR. Family  Communication:  His wife was in earlier, but left home. Disposition Plan:   Patient is from:  Home.  Anticipated DC to:  Home.  Anticipated DC date:  01/23/2021.  Anticipated DC barriers: Clinical status.  Consults called:  [facility] (left message for Dr. Paulita Fujita) Admission status:  Inpatient/progressive  care unit.  Severity of Illness:  High severity after presenting with acute GI bleed with hypotension and AKI superimposed on stage III CKD.  The patient will need to remain in the hospital for at least 24 to 48 hours for close monitoring, further work-up and evaluation by gastroenterology.  Reubin Milan MD [facility]  How to contact the [facility] Attending or Consulting provider [place] or covering provider during after hours Okay, for this patient?   Check the care team in [facility] and look for a) attending/consulting [facility] provider listed and b) the [facility] team listed Log into [email or web address] and use [facility]'s universal password to access. If you do not have the password, please contact the hospital operator. Locate the [facility] provider you are looking for under [facility] and page to a number that you can be directly reached. If you still have difficulty reaching the provider, please page the [facility] (Director on Call) for the Hospitalists listed on amion for assistance.  01/20/2021, 10:50 PM   This document was prepared using Dragon voice recognition software and may contain some unintended transcription errors.

## 2021-01-21 ENCOUNTER — Other Ambulatory Visit: Payer: Self-pay

## 2021-01-21 DIAGNOSIS — K922 Gastrointestinal hemorrhage, unspecified: Secondary | ICD-10-CM | POA: Diagnosis not present

## 2021-01-21 LAB — RENAL FUNCTION PANEL
Albumin: 2 g/dL — ABNORMAL LOW (ref 3.5–5.0)
Anion gap: 9 (ref 5–15)
BUN: 97 mg/dL — ABNORMAL HIGH (ref 8–23)
CO2: 15 mmol/L — ABNORMAL LOW (ref 22–32)
Calcium: 7.4 mg/dL — ABNORMAL LOW (ref 8.9–10.3)
Chloride: 114 mmol/L — ABNORMAL HIGH (ref 98–111)
Creatinine, Ser: 2.75 mg/dL — ABNORMAL HIGH (ref 0.61–1.24)
GFR, Estimated: 21 mL/min — ABNORMAL LOW (ref >60)
Glucose, Bld: 95 mg/dL (ref 70–99)
Phosphorus: 3.1 mg/dL (ref 2.5–4.6)
Potassium: 4 mmol/L (ref 3.5–5.1)
Sodium: 138 mmol/L (ref 135–145)

## 2021-01-21 LAB — CBC
HCT: 19.1 % — ABNORMAL LOW (ref 39.0–52.0)
HCT: 20.1 % — ABNORMAL LOW (ref 39.0–52.0)
Hemoglobin: 5.9 g/dL — CL (ref 13.0–17.0)
Hemoglobin: 6.4 g/dL — CL (ref 13.0–17.0)
MCH: 31.1 pg (ref 26.0–34.0)
MCH: 31.4 pg (ref 26.0–34.0)
MCHC: 30.9 g/dL (ref 30.0–36.0)
MCHC: 31.8 g/dL (ref 30.0–36.0)
MCV: 100.5 fL — ABNORMAL HIGH (ref 80.0–100.0)
MCV: 98.5 fL (ref 80.0–100.0)
Platelets: 146 10*3/uL — ABNORMAL LOW (ref 150–400)
Platelets: 152 10*3/uL (ref 150–400)
RBC: 1.9 MIL/uL — ABNORMAL LOW (ref 4.22–5.81)
RBC: 2.04 MIL/uL — ABNORMAL LOW (ref 4.22–5.81)
RDW: 16.1 % — ABNORMAL HIGH (ref 11.5–15.5)
RDW: 15.9 % — ABNORMAL HIGH (ref 11.5–15.5)
WBC: 5.4 10*3/uL (ref 4.0–10.5)
WBC: 8.8 10*3/uL (ref 4.0–10.5)
nRBC: 0 % (ref 0.0–0.2)
nRBC: 0 % (ref 0.0–0.2)

## 2021-01-21 LAB — PREPARE RBC (CROSSMATCH)

## 2021-01-21 LAB — RESP PANEL BY RT-PCR (FLU A&B, COVID) ARPGX2
Influenza A by PCR: NEGATIVE
Influenza B by PCR: NEGATIVE
SARS Coronavirus 2 by RT PCR: NEGATIVE

## 2021-01-21 MED ORDER — SODIUM CHLORIDE 0.9 % IV SOLN: INTRAVENOUS | Status: DC

## 2021-01-21 MED ORDER — SODIUM CHLORIDE 0.9% IV SOLUTION: Freq: Once | INTRAVENOUS | Status: DC

## 2021-01-21 MED ORDER — FERROUS SULFATE 325 (65 FE) MG PO TABS
325.0000 mg | ORAL_TABLET | Freq: Every day | ORAL | Status: DC
  Administered 2021-01-23 – 2021-01-28 (×4): 325 mg via ORAL
  Filled 2021-01-21 (×6): qty 1

## 2021-01-21 MED ORDER — VITAMIN D 25 MCG (1000 UNIT) PO TABS
2000.0000 [IU] | ORAL_TABLET | Freq: Every day | ORAL | Status: DC
  Administered 2021-01-21 – 2021-01-27 (×7): 2000 [IU] via ORAL
  Filled 2021-01-21 (×7): qty 2

## 2021-01-21 MED ORDER — FINASTERIDE 5 MG PO TABS
5.0000 mg | ORAL_TABLET | Freq: Every evening | ORAL | Status: DC
  Administered 2021-01-21 – 2021-01-27 (×7): 5 mg via ORAL
  Filled 2021-01-21 (×8): qty 1

## 2021-01-21 MED ORDER — DONEPEZIL HCL 5 MG PO TABS
5.0000 mg | ORAL_TABLET | Freq: Two times a day (BID) | ORAL | Status: DC
  Administered 2021-01-21 – 2021-01-28 (×12): 5 mg via ORAL
  Filled 2021-01-21 (×14): qty 1

## 2021-01-21 MED ORDER — SODIUM CHLORIDE 0.9 % IV SOLN
1.0000 g | INTRAVENOUS | Status: DC
  Administered 2021-01-22 (×2): 1 g via INTRAVENOUS
  Filled 2021-01-21: qty 10
  Filled 2021-01-21: qty 1
  Filled 2021-01-21: qty 10

## 2021-01-21 MED ORDER — ATORVASTATIN CALCIUM 40 MG PO TABS
40.0000 mg | ORAL_TABLET | Freq: Every evening | ORAL | Status: DC
  Administered 2021-01-21 – 2021-01-27 (×7): 40 mg via ORAL
  Filled 2021-01-21 (×8): qty 1

## 2021-01-21 MED ORDER — SERTRALINE HCL 25 MG PO TABS
25.0000 mg | ORAL_TABLET | Freq: Every day | ORAL | Status: DC
  Administered 2021-01-23 – 2021-01-28 (×5): 25 mg via ORAL
  Filled 2021-01-21 (×6): qty 1

## 2021-01-21 MED ORDER — ACETAMINOPHEN 500 MG PO TABS: 500.0000 mg | ORAL_TABLET | Freq: Four times a day (QID) | ORAL | Status: DC | PRN

## 2021-01-21 NOTE — Consult Note (Addendum)
Urology Consult Note   Requesting Attending Physician:  Darliss Cheney, MD Service Providing Consult: Urology  Consulting Attending: Franchot Gallo, MD   Reason for Consult:  nephrolithiasis  HPI: [name] [name] is seen in consultation for reasons noted above at the request of Darliss Cheney, MD for evaluation of nephrolthiasis.   This is a 87 y.o. male with PMH A fib (not on anticoagulation due to GI bleeds), dementi, diverticulosis, HLD, HTN who is being treated for acute GI bleed and blood loss anemia. Incidentally found to have UA suspicious for UTI and left nephrolithiasis with stone at the UPJ and mild proximal hydronephrosis.   He is known to our practice and has been seen in the past for BPH.  He is currently on finasteride 5 mg.  He was last seen in 2017 at which point he was referred to [place] for a 2 cm left renal stone.  Appears to be the same stone that has not since been treated.  He denies acute flank pain.  Does note intermittent hematuria but none recently.  Denies fevers.  He has had stone treatment in the past in the form of shockwave and in the form of ureteroscopy.  He has been afebrile during this admission.  Intermittently hypotensive but has improved after transfusions.  Past Medical History: Past Medical History:  Diagnosis Date   Atrial fibrillation, permanent (HCC)    no longer on AC due to GI bleed   CAD (coronary artery disease)    Dementia (HCC)    Diverticulosis    diverticular bleed 2011   High cholesterol    HTN (hypertension)    has improved over time, no rx as of 2019   Lower GI bleed     Past Surgical History:  Past Surgical History:  Procedure Laterality Date   angiolplasty     APPENDECTOMY     INSERT / REPLACE / REMOVE PACEMAKER     INTRAMEDULLARY (IM) NAIL INTERTROCHANTERIC Left 12/26/2018   Procedure: INTRAMEDULLARY (IM) NAIL INTERTROCHANTRIC;  Surgeon: Leandrew Koyanagi, MD;  Location: [facility];  Service: Orthopedics;  Laterality: Left;    INTRAMEDULLARY (IM) NAIL INTERTROCHANTERIC Right 03/22/2020   Procedure: INTRAMEDULLARY (IM) NAIL INTERTROCHANTRIC;  Surgeon: Newt Minion, MD;  Location: [facility];  Service: Orthopedics;  Laterality: Right;   KNEE SURGERY     PILONIDAL CYST EXCISION     rotator cuff surgery  1985   TONSILLECTOMY     triple heart bypass      Medication: Current Facility-Administered Medications  Medication Dose Route Frequency Provider Last Rate Last Admin   0.9 %  sodium chloride infusion (Manually program via Guardrails IV Fluids)   Intravenous Once Reubin Milan, MD       0.9 %  sodium chloride infusion   Intravenous Continuous Darliss Cheney, MD       acetaminophen (TYLENOL) tablet 650 mg  650 mg Oral Q6H PRN Reubin Milan, MD       Or   acetaminophen (TYLENOL) suppository 650 mg  650 mg Rectal Q6H PRN Reubin Milan, MD       atorvastatin (LIPITOR) tablet 40 mg  40 mg Oral QPM Pahwani, Einar Grad, MD       cefTRIAXone (ROCEPHIN) 1 g in sodium chloride 0.9 % 100 mL IVPB  1 g Intravenous Q24H Reubin Milan, MD       cholecalciferol (VITAMIN D3) tablet 2,000 Units  2,000 Units Oral QHS Darliss Cheney, MD  donepezil (ARICEPT) tablet 5 mg  5 mg Oral BID Darliss Cheney, MD       ferrous sulfate tablet 325 mg  325 mg Oral Daily Pahwani, Ravi, MD       finasteride (PROSCAR) tablet 5 mg  5 mg Oral QPM Pahwani, Einar Grad, MD       ondansetron (ZOFRAN) tablet 4 mg  4 mg Oral Q6H PRN Reubin Milan, MD       Or   ondansetron [facility]) injection 4 mg  4 mg Intravenous Q6H PRN Reubin Milan, MD       pantoprazole (PROTONIX) injection 40 mg  40 mg Intravenous Q12H Reubin Milan, MD   40 mg at 01/21/21 1159   sertraline (ZOLOFT) tablet 25 mg  25 mg Oral Daily Darliss Cheney, MD        Allergies: Allergies  Allergen Reactions   Amiodarone Other (See Comments)    Had a severe lung infection in 2003   Antihistamines, Diphenhydramine-Type Other (See Comments)    Jittery   Lovastatin      Other reaction(s): NAUSEA,VOMITING   Other     Other reaction(s): NAUSEA,VOMITING Other reaction(s): NAUSEA,VOMITING    Social History: Social History   Tobacco Use   Smoking status: Former    Types: Cigarettes    Quit date: 07/02/1974    Years since quitting: 46.5   Smokeless tobacco: Never  Vaping Use   Vaping Use: Never used  Substance Use Topics   Alcohol use: Not Currently    Comment: last used in dec 2019   Drug use: No    Family History Family History  Problem Relation Age of Onset   Stroke Father    Cervical cancer Other        siblings   COPD Sister    Dementia Brother     Review of Systems 10 systems were reviewed and are negative except as noted specifically in the HPI.  Objective   Vital signs in last 24 hours: BP 108/62 (BP Location: Left Arm)   Pulse 68   Temp (!) 97.3 F (36.3 C) (Oral)   Resp 17   Ht 6' (1.829 m)   Wt 63.3 kg   SpO2 100%   BMI 18.93 kg/m   Physical Exam General: NAD, A&O, resting, appropriate HEENT: [place]/AT, EOMI, MMM Pulmonary: Normal work of breathing Cardiovascular: HDS, adequate peripheral perfusion Abdomen: Soft, NTTP, nondistended. GU: No significant CVA tenderness Extremities: warm and well perfused Neuro: Appropriate, no focal neurological deficits  Most Recent Labs: Lab Results  Component Value Date   WBC 5.4 01/21/2021   HGB 5.9 (LL) 01/21/2021   HCT 19.1 (L) 01/21/2021   PLT 146 (L) 01/21/2021    Lab Results  Component Value Date   NA 138 01/21/2021   K 4.0 01/21/2021   CL 114 (H) 01/21/2021   CO2 15 (L) 01/21/2021   BUN 97 (H) 01/21/2021   CREATININE 2.75 (H) 01/21/2021   CALCIUM 7.4 (L) 01/21/2021   MG 2.2 12/16/2008   PHOS 3.1 01/21/2021    Lab Results  Component Value Date   INR 1.2 03/21/2020   APTT 34 12/15/2008     Urine Culture: @[id number] (laburin,org,[id number])@   IMAGING: CT RENAL STONE STUDY  Result Date: 01/20/2021 CLINICAL DATA:  87 year old male with epigastric  pain and anemia. Blood in stool. Evaluate for kidney stone. EXAM: CT ABDOMEN AND PELVIS WITHOUT CONTRAST TECHNIQUE: Multidetector CT imaging of the abdomen and pelvis was performed following the standard protocol without  IV contrast. COMPARISON:  CT abdomen pelvis dated 06/05/2018. FINDINGS: Evaluation of this exam is limited in the absence of intravenous contrast. Lower chest: There is emphysematous changes of the lungs. There is advanced 3 vessel coronary vascular calcification. Partially visualized pacemaker wire. No intra-abdominal free air or free fluid. Hepatobiliary: The liver is unremarkable. No intrahepatic biliary dilatation. The gallbladder is unremarkable. Pancreas: Unremarkable. No pancreatic ductal dilatation or surrounding inflammatory changes. Spleen: Normal in size without focal abnormality. Adrenals/Urinary Tract: The adrenal glands unremarkable there is a large stone or clusters of stone in the left renal pelvis extending to the ureteropelvic junction measuring up to 2.4 cm length. There is moderate left hydronephrosis. Additional nonobstructing left renal inferior pole calculi noted. There is moderate left renal parenchyma atrophy. Small hypodense lesion from the superior pole the left kidney is not characterized. There is mild right renal parenchyma atrophy with mild right hydronephrosis. There is mild right hydroureter. No stone identified in the right kidney or in the visualized right ureter. There is diffuse trabeculated bladder wall likely related to chronic bladder outlet obstruction. Diffusely thickened bladder wall with perivesical stranding. Correlation with urinalysis recommended to evaluate for cystitis. Stomach/Bowel: There is severe sigmoid diverticulosis without active inflammatory changes. There is no bowel obstruction. Appendectomy. Vascular/Lymphatic: Advanced aortoiliac atherosclerotic disease. The IVC is unremarkable. No portal venous gas. There is no adenopathy. Reproductive:  The prostate gland is not well visualized Other: None Musculoskeletal: Severe osteopenia and degenerative changes. Bilateral femoral ORIF. No acute osseous pathology. IMPRESSION: 1. Large stone or clusters of stone in the left renal pelvis extending to the ureteropelvic junction with moderate left hydronephrosis. Additional nonobstructing left renal inferior pole calculi noted. 2. Thickened bladder wall with perivesical stranding. Correlation with urinalysis recommended to evaluate for cystitis. 3. Severe sigmoid diverticulosis. No bowel obstruction. 4. Aortic Atherosclerosis (ICD10-I70.0). Electronically Signed   By: Anner Crete M.D.   On: 01/20/2021 23:44    ------  Assessment:  87 y.o. male with PMH A fib (not on anticoagulation due to GI bleeds), dementi, diverticulosis, HLD, HTN who is being treated for acute GI bleed and blood loss anemia. Incidentally found to have UA suspicious for UTI and left nephrolithiasis with stone at the UPJ and mild proximal hydronephrosis.   He is being resuscitated by the primary team for his anemia. Creatinine upon admission was 3.4 from his baseline of 1.5-2. Downtrending to 2.75 today. 2upRBC for Hb 5.9 today. Being evlauated by GI.   With regards to his nephrolithiasis, patient is currently afebrile and stable. He does have hydronephrosis on the left and UA suspicious for UTI. No leukocytosis. Creatinine downtrending. He would benefit from left ureteral stent placement to decrease his risk for concomincant urosepsis. Patient would then need the stone burden treated, unless he opted for serial stent exchanges rather than stone treatment (ureteroscopy versus percutaneous nephrostolithotomy).   This was discussed with the patient and his wife.  They opted to proceed with left ureteral stent placement to decrease his chances of developing pyelonephritis or urosepsis down the line.  He does have an appointment scheduled with Dr. Lovena Neighbours on January 31, 2021 and will  practice for BPH.  Recommendations: -Please keep the patient n.p.o. starting midnight tonight -We will post the patient for cystoscopy, left ureteral stent placement on 01/22/2021.  Please page [place] if the patient develops a fever in the interim. -Please treat his UTI with complicated UTI duration with culture specific antibiotics -Continue resuscitation per primary team  Thank you for this consult.  Please contact the urology consult pager with any further questions/concerns.   I have reviewed the pt's history, labs, imaging and spoken w/ the pt (he has minimal understanding of situation). I agree w/ assessment and plan.

## 2021-01-21 NOTE — Progress Notes (Signed)
PROGRESS NOTE    [name] [name]  ZDG:[id number] DOB: 1926-03-07 DOA: 01/20/2021 PCP: [name], MD   Brief Narrative:  HPI: [name] [name] is a 87 y.o. male with medical history significant of permanent atrial fibrillation no longer on anticoagulation due to previous GI bleed history, CAD, unspecified dementia, diverticulosis with history of diverticular bleed in 2011, hyperlipidemia, hypertension who was brought via EMS to the emergency department with a history of 24 hours of diarrhea and dark stools.  He has a history of dementia and is able to answer simple questions.  He denied headache, chest pain, back or abdominal pain at the time of examination.  He stated he was feeling better.   ED Course: Initial vital signs were temperature 97.4 F, pulse 60, respiration 19, BP 115/70 mmHg O2 sat 100% on room air.  The patient received a gram of ceftriaxone and 1500 mL of NS bolus.  He was transfused a unit of PRBC.    Assessment & Plan:   Principal Problem:   Acute GI bleeding Active Problems:   Coronary artery disease involving native coronary artery of native heart without angina pectoris   Alzheimer's disease (HCC)   Hypercholesterolemia   HTN (hypertension)   BPH (benign prostatic hyperplasia)   Urinary tract infection   Acute kidney injury superimposed on CKD (HCC)   Macrocytic anemia  Acute GI bleeding, lower vs upper: Presented with hemoglobin 7.2.  Dropped to 5.9.  Transfusion ordered by night hospitalist.  No further episodes of GI bleeding here.  GI consulted.  Awaiting further recommendations.  Continue PPI.  Monitor H&H every 12 hours.  AKI on CKD stage IIIb/large left renal pelvis stone/left nephrolithiasis/obstructive uropathy: Baseline creatinine between 1.7 and 2.  Presented with 3.4.  Likely combination of obstructive uropathy and prerenal since his creatinine has improved somewhat with hydration.  Consulted urology.  Will resume IV hydration.     Urinary tract  infection: Continue Rocephin and follow culture.  BPH (benign prostatic hyperplasia) Resume Proscar.     Coronary artery disease Currently NPO. On aspirin atorvastatin.     Alzheimer's disease ([place]) Continue donezepil 5 mg p.o. twice daily.     Hypercholesterolemia On atorvastatin 10 mg p.o. daily.     HTN (hypertension) He was hypotensive upon presentation but BP better now.  Resume Proscar.  DVT prophylaxis: SCDs Start: 01/20/21 2238   Code Status: DNR  Family Communication: None present at bedside.  Status is: Inpatient  Remains inpatient appropriate because:Ongoing diagnostic testing needed not appropriate for outpatient work up  Dispo: The patient is from: Home              Anticipated d/c is to: Home              Patient currently is not medically stable to d/c.   Difficult to place patient No        Estimated body mass index is 18.93 kg/m as calculated from the following:   Height as of this encounter: 6' (1.829 m).   Weight as of this encounter: 63.3 kg.      Nutritional status:               Consultants:  GI and urology  Procedures:  None  Antimicrobials:  Anti-infectives (From admission, onward)    Start     Dose/Rate Route Frequency Ordered Stop   01/21/21 2300  cefTRIAXone (ROCEPHIN) 1 g in sodium chloride 0.9 % 100 mL IVPB  1 g 200 mL/hr over 30 Minutes Intravenous Every 24 hours 01/21/21 0758     01/20/21 2245  cefTRIAXone (ROCEPHIN) 1 g in sodium chloride 0.9 % 100 mL IVPB        1 g 200 mL/hr over 30 Minutes Intravenous  Once 01/20/21 2235 01/21/21 0116          Subjective: Seen and examined.  Patient alert and oriented to self only.  Denies any complaint.  Looks comfortable.  Objective: Vitals:   01/21/21 0647 01/21/21 0813 01/21/21 0850 01/21/21 1059  BP: 99/60 106/63 (!) 90/55 108/62  Pulse: 65 64 66 68  Resp: 18 20 19 17   Temp: (!) 97.5 F (36.4 C) (!) 97.4 F (36.3 C) 97.6 F (36.4 C) (!) 97.3 F  (36.3 C)  TempSrc: Oral Oral Oral Oral  SpO2: 100% 99% 100% 100%  Weight: 63.3 kg     Height: 6' (1.829 m)       Intake/Output Summary (Last 24 hours) at 01/21/2021 1215 Last data filed at 01/21/2021 1059 Gross per 24 hour  Intake 1636.25 ml  Output --  Net 1636.25 ml   Filed Weights   01/21/21 0647  Weight: 63.3 kg    Examination:  General exam: Appears calm and comfortable  Respiratory system: Clear to auscultation. Respiratory effort normal. Cardiovascular system: S1 & S2 heard, RRR. No JVD, murmurs, rubs, gallops or clicks. No pedal edema. Gastrointestinal system: Abdomen is nondistended, soft and nontender. No organomegaly or masses felt. Normal bowel sounds heard. Central nervous system: Alert and oriented x1. No focal neurological deficits. Extremities: Symmetric 5 x 5 power. Skin: No rashes, lesions or ulcers    Data Reviewed: I have personally reviewed following labs and imaging studies  CBC: Recent Labs  Lab 01/20/21 2035 01/21/21 0427  WBC 7.0 5.4  NEUTROABS 5.5  --   HGB 7.2* 5.9*  HCT 22.8* 19.1*  MCV 103.6* 100.5*  PLT 212 097*   Basic Metabolic Panel: Recent Labs  Lab 01/20/21 2035 01/21/21 0428  NA 135 138  K 4.8 4.0  CL 107 114*  CO2 15* 15*  GLUCOSE 126* 95  BUN 108* 97*  CREATININE 3.40* 2.75*  CALCIUM 9.0 7.4*  PHOS  --  3.1   GFR: Estimated Creatinine Clearance: 15 mL/min (A) (by C-G formula based on SCr of 2.75 mg/dL (H)). Liver Function Tests: Recent Labs  Lab 01/20/21 2035 01/21/21 0428  AST 21  --   ALT 13  --   ALKPHOS 71  --   BILITOT 0.2*  --   PROT 6.4*  --   ALBUMIN 2.6* 2.0*   Recent Labs  Lab 01/20/21 2035  LIPASE 44   No results for input(s): AMMONIA in the last 168 hours. Coagulation Profile: No results for input(s): INR, PROTIME in the last 168 hours. Cardiac Enzymes: No results for input(s): CKTOTAL, CKMB, CKMBINDEX, TROPONINI in the last 168 hours. BNP (last 3 results) No results for input(s):  PROBNP in the last 8760 hours. HbA1C: No results for input(s): HGBA1C in the last 72 hours. CBG: No results for input(s): GLUCAP in the last 168 hours. Lipid Profile: No results for input(s): CHOL, HDL, LDLCALC, TRIG, CHOLHDL, LDLDIRECT in the last 72 hours. Thyroid Function Tests: No results for input(s): TSH, T4TOTAL, FREET4, T3FREE, THYROIDAB in the last 72 hours. Anemia Panel: No results for input(s): VITAMINB12, FOLATE, FERRITIN, TIBC, IRON, RETICCTPCT in the last 72 hours. Sepsis Labs: No results for input(s): PROCALCITON, LATICACIDVEN in the last 168 hours.  Recent Results (from the past 240 hour(s))  Resp Panel by RT-PCR (Flu A&B, Covid) Nasopharyngeal Swab     Status: None   Collection Time: 01/21/21  4:30 AM   Specimen: Nasopharyngeal Swab; Nasopharyngeal(NP) swabs in vial transport medium  Result Value Ref Range Status   SARS Coronavirus 2 by RT PCR NEGATIVE NEGATIVE Final    Comment: (NOTE) SARS-CoV-2 target nucleic acids are NOT DETECTED.  The SARS-CoV-2 RNA is generally detectable in upper respiratory specimens during the acute phase of infection. The lowest concentration of SARS-CoV-2 viral copies this assay can detect is 138 copies/mL. A negative result does not preclude SARS-Cov-2 infection and should not be used as the sole basis for treatment or other patient management decisions. A negative result may occur with  improper specimen collection/handling, submission of specimen other than nasopharyngeal swab, presence of viral mutation(s) within the areas targeted by this assay, and inadequate number of viral copies(<138 copies/mL). A negative result must be combined with clinical observations, patient history, and epidemiological information. The expected result is Negative.  Fact Sheet for Patients:  EntrepreneurPulse.com.au  Fact Sheet for Healthcare Providers:  IncredibleEmployment.be  This test is no t yet approved or  cleared by the [place] FDA and  has been authorized for detection and/or diagnosis of SARS-CoV-2 by FDA under an Emergency Use Authorization (EUA). This EUA will remain  in effect (meaning this test can be used) for the duration of the COVID-19 declaration under Section 564(b)(1) of the Act, 21 U.S.C.section 360bbb-3(b)(1), unless the authorization is terminated  or revoked sooner.       Influenza A by PCR NEGATIVE NEGATIVE Final   Influenza B by PCR NEGATIVE NEGATIVE Final    Comment: (NOTE) The Xpert Xpress SARS-CoV-2/FLU/RSV plus assay is intended as an aid in the diagnosis of influenza from Nasopharyngeal swab specimens and should not be used as a sole basis for treatment. Nasal washings and aspirates are unacceptable for Xpert Xpress SARS-CoV-2/FLU/RSV testing.  Fact Sheet for Patients: EntrepreneurPulse.com.au  Fact Sheet for Healthcare Providers: IncredibleEmployment.be  This test is not yet approved or cleared by the [place] FDA and has been authorized for detection and/or diagnosis of SARS-CoV-2 by FDA under an Emergency Use Authorization (EUA). This EUA will remain in effect (meaning this test can be used) for the duration of the COVID-19 declaration under Section 564(b)(1) of the Act, 21 U.S.C. section 360bbb-3(b)(1), unless the authorization is terminated or revoked.  Performed at [facility], [place] [address], [place], [place] [address]       Radiology Studies: CT RENAL STONE STUDY  Result Date: 01/20/2021 CLINICAL DATA:  87 year old male with epigastric pain and anemia. Blood in stool. Evaluate for kidney stone. EXAM: CT ABDOMEN AND PELVIS WITHOUT CONTRAST TECHNIQUE: Multidetector CT imaging of the abdomen and pelvis was performed following the standard protocol without IV contrast. COMPARISON:  CT abdomen pelvis dated 06/05/2018. FINDINGS: Evaluation of this exam is limited in the absence of intravenous  contrast. Lower chest: There is emphysematous changes of the lungs. There is advanced 3 vessel coronary vascular calcification. Partially visualized pacemaker wire. No intra-abdominal free air or free fluid. Hepatobiliary: The liver is unremarkable. No intrahepatic biliary dilatation. The gallbladder is unremarkable. Pancreas: Unremarkable. No pancreatic ductal dilatation or surrounding inflammatory changes. Spleen: Normal in size without focal abnormality. Adrenals/Urinary Tract: The adrenal glands unremarkable there is a large stone or clusters of stone in the left renal pelvis extending to the ureteropelvic junction measuring up to 2.4 cm length.  There is moderate left hydronephrosis. Additional nonobstructing left renal inferior pole calculi noted. There is moderate left renal parenchyma atrophy. Small hypodense lesion from the superior pole the left kidney is not characterized. There is mild right renal parenchyma atrophy with mild right hydronephrosis. There is mild right hydroureter. No stone identified in the right kidney or in the visualized right ureter. There is diffuse trabeculated bladder wall likely related to chronic bladder outlet obstruction. Diffusely thickened bladder wall with perivesical stranding. Correlation with urinalysis recommended to evaluate for cystitis. Stomach/Bowel: There is severe sigmoid diverticulosis without active inflammatory changes. There is no bowel obstruction. Appendectomy. Vascular/Lymphatic: Advanced aortoiliac atherosclerotic disease. The IVC is unremarkable. No portal venous gas. There is no adenopathy. Reproductive: The prostate gland is not well visualized Other: None Musculoskeletal: Severe osteopenia and degenerative changes. Bilateral femoral ORIF. No acute osseous pathology. IMPRESSION: 1. Large stone or clusters of stone in the left renal pelvis extending to the ureteropelvic junction with moderate left hydronephrosis. Additional nonobstructing left renal  inferior pole calculi noted. 2. Thickened bladder wall with perivesical stranding. Correlation with urinalysis recommended to evaluate for cystitis. 3. Severe sigmoid diverticulosis. No bowel obstruction. 4. Aortic Atherosclerosis (ICD10-I70.0). Electronically Signed   By: Anner Crete M.D.   On: 01/20/2021 23:44    Scheduled Meds:  sodium chloride   Intravenous Once   atorvastatin  40 mg Oral QPM   donepezil  5 mg Oral BID   ferrous sulfate  325 mg Oral Daily   pantoprazole (PROTONIX) IV  40 mg Intravenous Q12H   sertraline  25 mg Oral Daily   Vitamin D  2,000 Units Oral QHS   Continuous Infusions:  cefTRIAXone (ROCEPHIN)  IV       LOS: 1 day   Time spent: 36 minutes   Darliss Cheney, MD [facility]  01/21/2021, 12:15 PM   How to contact the [facility] Attending or Consulting provider [place] or covering provider during after hours [place], for this patient?  Check the care team in [facility] and look for a) attending/consulting [facility] provider listed and b) the [facility] team listed. Page or secure chat [phone]. Log into [email or web address] and use [place]'s universal password to access. If you do not have the password, please contact the hospital operator. Locate the [facility] provider you are looking for under [facility] and page to a number that you can be directly reached. If you still have difficulty reaching the provider, please page the [facility] (Director on Call) for the Hospitalists listed on amion for assistance.

## 2021-01-21 NOTE — ED Notes (Signed)
Provider has been paged about pt's 5.9 hemoglobin, transporting pt up to 4E17 now.

## 2021-01-21 NOTE — ED Notes (Signed)
Pt's low hemoglobin was paged to physician to this RN's phone in ED [phone]), pt is now in 4E in room 17.

## 2021-01-21 NOTE — Consult Note (Signed)
[facility] Consultation Note  Referring Provider: [facility] Primary Care Physician:  [name] Manes, MD  Reason for Consultation:  Hematochezia  HPI: [name] [name] is a 87 y.o. male presenting with weakness, hematochezia.  At least couple prior episodes, 2011 and 2019.  Had lost colonoscopy 2011 showing diverticulosis.  No abdominal pain.   Past Medical History:  Diagnosis Date   Atrial fibrillation, permanent (HCC)    no longer on AC due to GI bleed   CAD (coronary artery disease)    Dementia (HCC)    Diverticulosis    diverticular bleed 2011   High cholesterol    HTN (hypertension)    has improved over time, no rx as of 2019   Lower GI bleed     Past Surgical History:  Procedure Laterality Date   angiolplasty     APPENDECTOMY     INSERT / REPLACE / REMOVE PACEMAKER     INTRAMEDULLARY (IM) NAIL INTERTROCHANTERIC Left 12/26/2018   Procedure: INTRAMEDULLARY (IM) NAIL INTERTROCHANTRIC;  Surgeon: Leandrew Koyanagi, MD;  Location: [facility];  Service: Orthopedics;  Laterality: Left;   INTRAMEDULLARY (IM) NAIL INTERTROCHANTERIC Right 03/22/2020   Procedure: INTRAMEDULLARY (IM) NAIL INTERTROCHANTRIC;  Surgeon: Newt Minion, MD;  Location: [facility];  Service: Orthopedics;  Laterality: Right;   KNEE SURGERY     PILONIDAL CYST EXCISION     rotator cuff surgery  1985   TONSILLECTOMY     triple heart bypass      Prior to Admission medications   Medication Sig Start Date End Date Taking? Authorizing Provider  acetaminophen (TYLENOL) 500 MG tablet Take 1 tablet (500 mg total) by mouth every 6 (six) hours as needed for mild pain. 03/22/20   Newt Minion, MD  aspirin EC 81 MG tablet Take 81 mg by mouth daily. Swallow whole.    [provider]  atorvastatin (LIPITOR) 40 MG tablet Take 40 mg by mouth every evening.     [provider]  Cholecalciferol (VITAMIN D) 2000 UNITS CAPS Take 2,000 Units by mouth at bedtime.     [provider]  donepezil  (ARICEPT) 5 MG tablet Take 1 tablet (5 mg total) by mouth 2 (two) times daily. 08/29/20   Dohmeier, Asencion Partridge, MD  ferrous sulfate 325 (65 FE) MG EC tablet Take 325 mg by mouth daily.     [provider]  finasteride (PROSCAR) 5 MG tablet Take 5 mg by mouth every evening.     [provider]  lactose free nutrition (BOOST) LIQD Take 237 mLs by mouth daily. Add chocolate ice cream to make a milkshake    [provider]  omeprazole (PRILOSEC) 20 MG capsule Take 20 mg by mouth daily.    [provider]  polyethylene glycol (MIRALAX / GLYCOLAX) 17 g packet Take 17 g by mouth daily as needed for mild constipation. 03/24/20   Donne Hazel, MD  sertraline (ZOLOFT) 25 MG tablet Take 1 tablet (25 mg total) by mouth daily. 08/29/20   Dohmeier, Asencion Partridge, MD  sulfamethoxazole-trimethoprim (BACTRIM) 400-80 MG tablet 1 tablet 12/19/20   [provider]    Current Facility-Administered Medications  Medication Dose Route Frequency Provider Last Rate Last Admin   0.9 %  sodium chloride infusion (Manually program via Guardrails IV Fluids)   Intravenous Once Reubin Milan, MD       0.9 %  sodium chloride infusion   Intravenous Continuous Darliss Cheney, MD       acetaminophen (TYLENOL)  tablet 650 mg  650 mg Oral Q6H PRN Reubin Milan, MD       Or   acetaminophen (TYLENOL) suppository 650 mg  650 mg Rectal Q6H PRN Reubin Milan, MD       atorvastatin (LIPITOR) tablet 40 mg  40 mg Oral QPM Darliss Cheney, MD       cefTRIAXone (ROCEPHIN) 1 g in sodium chloride 0.9 % 100 mL IVPB  1 g Intravenous Q24H Reubin Milan, MD       cholecalciferol (VITAMIN D3) tablet 2,000 Units  2,000 Units Oral QHS Darliss Cheney, MD       donepezil (ARICEPT) tablet 5 mg  5 mg Oral BID Darliss Cheney, MD       ferrous sulfate tablet 325 mg  325 mg Oral Daily Pahwani, Einar Grad, MD       finasteride (PROSCAR) tablet 5 mg  5 mg Oral QPM Darliss Cheney, MD       ondansetron (ZOFRAN) tablet  4 mg  4 mg Oral Q6H PRN Reubin Milan, MD       Or   ondansetron [facility]) injection 4 mg  4 mg Intravenous Q6H PRN Reubin Milan, MD       pantoprazole (PROTONIX) injection 40 mg  40 mg Intravenous Q12H Reubin Milan, MD   40 mg at 01/21/21 1159   sertraline (ZOLOFT) tablet 25 mg  25 mg Oral Daily Darliss Cheney, MD        Allergies as of 01/20/2021 - Review Complete 12/28/2020  Allergen Reaction Noted   Amiodarone Other (See Comments) 01/26/2013   Antihistamines, diphenhydramine-type Other (See Comments) 01/26/2013   Lovastatin  07/20/2003   Other  07/20/2003    Family History  Problem Relation Age of Onset   Stroke Father    Cervical cancer Other        siblings   COPD Sister    Dementia Brother     Social History   Socioeconomic History   Marital status: Married    Spouse name: [name]   Number of children: 6   Years of education: college   Highest education level: Not on file  Occupational History   Occupation: retired  Tobacco Use   Smoking status: Former    Types: Cigarettes    Quit date: 07/02/1974    Years since quitting: 46.5   Smokeless tobacco: Never  Vaping Use   Vaping Use: Never used  Substance and Sexual Activity   Alcohol use: Not Currently    Comment: last used in dec 2019   Drug use: No   Sexual activity: Not on file  Other Topics Concern   Not on file  Social History Narrative   Patient is married [name]) and lives at home with his wife.   Patient has a college education.   Patient is a retired Materials engineer.   Patient does not drink any caffeine.   Patient has six children.   Social Determinants of Health   Financial Resource Strain: Not on file  Food Insecurity: Not on file  Transportation Needs: Not on file  Physical Activity: Not on file  Stress: Not on file  Social Connections: Not on file  Intimate Partner Violence: Not on file    Review of Systems: As per HPI, all others negative  Physical  Exam: Vital signs in last 24 hours: Temp:  [97.3 F (36.3 C)-98.2 F (36.8 C)] 97.3 F (36.3 C) (07/23 1059) Pulse Rate:  [53-105] 68 (07/23  1059) Resp:  [14-25] 17 (07/23 1059) BP: (90-135)/(54-104) 108/62 (07/23 1059) SpO2:  [92 %-100 %] 100 % (07/23 1059) Weight:  [63.3 kg] 63.3 kg (07/23 0647) Last BM Date: 01/20/21 General:   Alert,  thin, elderly, cachectic-apperaring Head:  Normocephalic and atraumatic. Eyes:  Sclera clear, no icterus.   Conjunctiva pale Ears:  Normal auditory acuity. Nose:  No deformity, discharge,  or lesions. Mouth:  No deformity or lesions.  Oropharynx pale and dry Neck:  Supple; no masses or thyromegaly. Abdomen:  Soft, nontender and nondistended. No masses, hepatosplenomegaly or hernias noted. Normal bowel sounds, without guarding, and without rebound.     Msk:  Symmetrical without gross deformities. Normal posture. Pulses:  Normal pulses noted. Extremities:  Without clubbing or edema. Neurologic:  Alert and  oriented x4;  grossly normal neurologically. Skin:  Pale, otherwise intact without significant lesions or rashes. Psych:  Alert and cooperative. Normal mood and affect.   Lab Results: Recent Labs    01/20/21 2035 01/21/21 0427  WBC 7.0 5.4  HGB 7.2* 5.9*  HCT 22.8* 19.1*  PLT 212 146*   BMET Recent Labs    01/20/21 2035 01/21/21 0428  NA 135 138  K 4.8 4.0  CL 107 114*  CO2 15* 15*  GLUCOSE 126* 95  BUN 108* 97*  CREATININE 3.40* 2.75*  CALCIUM 9.0 7.4*   LFT Recent Labs    01/20/21 2035 01/21/21 0428  PROT 6.4*  --   ALBUMIN 2.6* 2.0*  AST 21  --   ALT 13  --   ALKPHOS 71  --   BILITOT 0.2*  --    PT/INR No results for input(s): LABPROT, INR in the last 72 hours.  Studies/Results: CT RENAL STONE STUDY  Result Date: 01/20/2021 CLINICAL DATA:  87 year old male with epigastric pain and anemia. Blood in stool. Evaluate for kidney stone. EXAM: CT ABDOMEN AND PELVIS WITHOUT CONTRAST TECHNIQUE: Multidetector CT imaging  of the abdomen and pelvis was performed following the standard protocol without IV contrast. COMPARISON:  CT abdomen pelvis dated 06/05/2018. FINDINGS: Evaluation of this exam is limited in the absence of intravenous contrast. Lower chest: There is emphysematous changes of the lungs. There is advanced 3 vessel coronary vascular calcification. Partially visualized pacemaker wire. No intra-abdominal free air or free fluid. Hepatobiliary: The liver is unremarkable. No intrahepatic biliary dilatation. The gallbladder is unremarkable. Pancreas: Unremarkable. No pancreatic ductal dilatation or surrounding inflammatory changes. Spleen: Normal in size without focal abnormality. Adrenals/Urinary Tract: The adrenal glands unremarkable there is a large stone or clusters of stone in the left renal pelvis extending to the ureteropelvic junction measuring up to 2.4 cm length. There is moderate left hydronephrosis. Additional nonobstructing left renal inferior pole calculi noted. There is moderate left renal parenchyma atrophy. Small hypodense lesion from the superior pole the left kidney is not characterized. There is mild right renal parenchyma atrophy with mild right hydronephrosis. There is mild right hydroureter. No stone identified in the right kidney or in the visualized right ureter. There is diffuse trabeculated bladder wall likely related to chronic bladder outlet obstruction. Diffusely thickened bladder wall with perivesical stranding. Correlation with urinalysis recommended to evaluate for cystitis. Stomach/Bowel: There is severe sigmoid diverticulosis without active inflammatory changes. There is no bowel obstruction. Appendectomy. Vascular/Lymphatic: Advanced aortoiliac atherosclerotic disease. The IVC is unremarkable. No portal venous gas. There is no adenopathy. Reproductive: The prostate gland is not well visualized Other: None Musculoskeletal: Severe osteopenia and degenerative changes. Bilateral femoral ORIF. No  acute  osseous pathology. IMPRESSION: 1. Large stone or clusters of stone in the left renal pelvis extending to the ureteropelvic junction with moderate left hydronephrosis. Additional nonobstructing left renal inferior pole calculi noted. 2. Thickened bladder wall with perivesical stranding. Correlation with urinalysis recommended to evaluate for cystitis. 3. Severe sigmoid diverticulosis. No bowel obstruction. 4. Aortic Atherosclerosis (ICD10-I70.0). Electronically Signed   By: Anner Crete M.D.   On: 01/20/2021 23:44    Impression:   Hematochezia, suspect diverticulosis. Acute blood loss anemia. Ureteral stone.  Plan:   Supportive care:  IVF, follow CBCs, blood transfusion if needed. Do not suggest any invasive testing (e.g., colonoscopy) at this time. When/if patient has recurrent bleeding, consider tagged RBC study as next step in management. Clear liquid diet ok. Case discussed with patient and his wife at the bedside. Urology consulted for worsening renal function and ureteral stone. [facility] will follow.   LOS: 1 day   Charlena Haub M  01/21/2021, 1:42 PM  Cell [phone] If no answer or after 5 PM call [phone]

## 2021-01-22 ENCOUNTER — Encounter (HOSPITAL_COMMUNITY)
Admission: EM | Disposition: A | Discharge status: Hospice | Payer: Self-pay | Source: Home / Self Care | Attending: Internal Medicine

## 2021-01-22 ENCOUNTER — Encounter (HOSPITAL_COMMUNITY): Payer: Self-pay | Admitting: Certified Registered Nurse Anesthetist

## 2021-01-22 ENCOUNTER — Inpatient Hospital Stay (HOSPITAL_COMMUNITY): Payer: Medicare Other | Admitting: Certified Registered Nurse Anesthetist

## 2021-01-22 ENCOUNTER — Inpatient Hospital Stay (HOSPITAL_COMMUNITY): Payer: Medicare Other

## 2021-01-22 DIAGNOSIS — F0280 Dementia in other diseases classified elsewhere without behavioral disturbance: Secondary | ICD-10-CM

## 2021-01-22 DIAGNOSIS — N179 Acute kidney failure, unspecified: Secondary | ICD-10-CM

## 2021-01-22 DIAGNOSIS — N200 Calculus of kidney: Secondary | ICD-10-CM

## 2021-01-22 DIAGNOSIS — G309 Alzheimer's disease, unspecified: Secondary | ICD-10-CM

## 2021-01-22 DIAGNOSIS — D62 Acute posthemorrhagic anemia: Secondary | ICD-10-CM | POA: Diagnosis not present

## 2021-01-22 DIAGNOSIS — I4821 Permanent atrial fibrillation: Secondary | ICD-10-CM

## 2021-01-22 DIAGNOSIS — N3001 Acute cystitis with hematuria: Secondary | ICD-10-CM

## 2021-01-22 DIAGNOSIS — N189 Chronic kidney disease, unspecified: Secondary | ICD-10-CM

## 2021-01-22 DIAGNOSIS — K922 Gastrointestinal hemorrhage, unspecified: Secondary | ICD-10-CM | POA: Diagnosis not present

## 2021-01-22 DIAGNOSIS — I700 Atherosclerosis of aorta: Secondary | ICD-10-CM

## 2021-01-22 HISTORY — PX: CYSTOSCOPY W/ URETERAL STENT PLACEMENT: SHX1429

## 2021-01-22 LAB — URINE CULTURE: Culture: 20000 — AB

## 2021-01-22 LAB — RENAL FUNCTION PANEL
Albumin: 2 g/dL — ABNORMAL LOW (ref 3.5–5.0)
Anion gap: 10 (ref 5–15)
BUN: 109 mg/dL — ABNORMAL HIGH (ref 8–23)
CO2: 14 mmol/L — ABNORMAL LOW (ref 22–32)
Calcium: 8.6 mg/dL — ABNORMAL LOW (ref 8.9–10.3)
Chloride: 114 mmol/L — ABNORMAL HIGH (ref 98–111)
Creatinine, Ser: 3.02 mg/dL — ABNORMAL HIGH (ref 0.61–1.24)
GFR, Estimated: 19 mL/min — ABNORMAL LOW (ref >60)
Glucose, Bld: 103 mg/dL — ABNORMAL HIGH (ref 70–99)
Phosphorus: 3.8 mg/dL (ref 2.5–4.6)
Potassium: 4.7 mmol/L (ref 3.5–5.1)
Sodium: 138 mmol/L (ref 135–145)

## 2021-01-22 LAB — CBC
HCT: 20.4 % — ABNORMAL LOW (ref 39.0–52.0)
Hemoglobin: 6.4 g/dL — CL (ref 13.0–17.0)
MCH: 30.5 pg (ref 26.0–34.0)
MCHC: 31.4 g/dL (ref 30.0–36.0)
MCV: 97.1 fL (ref 80.0–100.0)
Platelets: 136 10*3/uL — ABNORMAL LOW (ref 150–400)
RBC: 2.1 MIL/uL — ABNORMAL LOW (ref 4.22–5.81)
RDW: 16.3 % — ABNORMAL HIGH (ref 11.5–15.5)
WBC: 8.4 10*3/uL (ref 4.0–10.5)
nRBC: 0 % (ref 0.0–0.2)

## 2021-01-22 LAB — PREPARE RBC (CROSSMATCH)

## 2021-01-22 SURGERY — CYSTOSCOPY, WITH RETROGRADE PYELOGRAM AND URETERAL STENT INSERTION
Anesthesia: General | Laterality: Left

## 2021-01-22 MED ORDER — CHLORHEXIDINE GLUCONATE 0.12 % MT SOLN
OROMUCOSAL | Status: AC
  Administered 2021-01-22: 15 mL via OROMUCOSAL
  Filled 2021-01-22: qty 15

## 2021-01-22 MED ORDER — ORAL CARE MOUTH RINSE: 15.0000 mL | Freq: Once | OROMUCOSAL | Status: AC

## 2021-01-22 MED ORDER — SODIUM CHLORIDE 0.9 % IV SOLN: INTRAVENOUS | Status: DC

## 2021-01-22 MED ORDER — LIDOCAINE 2% (20 MG/ML) 5 ML SYRINGE
INTRAMUSCULAR | Status: DC | PRN
  Administered 2021-01-22: 60 mg via INTRAVENOUS

## 2021-01-22 MED ORDER — SODIUM CHLORIDE 0.9% IV SOLUTION: Freq: Once | INTRAVENOUS | Status: DC

## 2021-01-22 MED ORDER — PHENYLEPHRINE HCL-NACL 10-0.9 MG/250ML-% IV SOLN
INTRAVENOUS | Status: DC | PRN
  Administered 2021-01-22: 60 ug/min via INTRAVENOUS

## 2021-01-22 MED ORDER — ACETAMINOPHEN 10 MG/ML IV SOLN: 1000.0000 mg | Freq: Once | INTRAVENOUS | Status: DC | PRN

## 2021-01-22 MED ORDER — AMISULPRIDE (ANTIEMETIC) 5 MG/2ML IV SOLN: 10.0000 mg | Freq: Once | INTRAVENOUS | Status: DC | PRN

## 2021-01-22 MED ORDER — LIDOCAINE 2% (20 MG/ML) 5 ML SYRINGE
INTRAMUSCULAR | Status: AC
  Filled 2021-01-22: qty 10

## 2021-01-22 MED ORDER — FENTANYL CITRATE (PF) 250 MCG/5ML IJ SOLN
INTRAMUSCULAR | Status: AC
  Filled 2021-01-22: qty 5

## 2021-01-22 MED ORDER — PROPOFOL 10 MG/ML IV BOLUS
INTRAVENOUS | Status: AC
  Filled 2021-01-22: qty 20

## 2021-01-22 MED ORDER — CHLORHEXIDINE GLUCONATE 0.12 % MT SOLN: 15.0000 mL | Freq: Once | OROMUCOSAL | Status: AC

## 2021-01-22 MED ORDER — WATER FOR IRRIGATION, STERILE IR SOLN
Status: DC | PRN
  Administered 2021-01-22: 3000 mL

## 2021-01-22 MED ORDER — LACTATED RINGERS IV SOLN: INTRAVENOUS | Status: DC

## 2021-01-22 MED ORDER — IOHEXOL 300 MG/ML  SOLN
INTRAMUSCULAR | Status: DC | PRN
  Administered 2021-01-22: 8 mL

## 2021-01-22 MED ORDER — SUCCINYLCHOLINE CHLORIDE 200 MG/10ML IV SOSY
PREFILLED_SYRINGE | INTRAVENOUS | Status: AC
  Filled 2021-01-22: qty 10

## 2021-01-22 MED ORDER — ROCURONIUM BROMIDE 10 MG/ML (PF) SYRINGE
PREFILLED_SYRINGE | INTRAVENOUS | Status: AC
  Filled 2021-01-22: qty 10

## 2021-01-22 MED ORDER — PHENYLEPHRINE 40 MCG/ML (10ML) SYRINGE FOR IV PUSH (FOR BLOOD PRESSURE SUPPORT)
PREFILLED_SYRINGE | INTRAVENOUS | Status: AC
  Filled 2021-01-22: qty 10

## 2021-01-22 MED ORDER — DEXAMETHASONE SODIUM PHOSPHATE 10 MG/ML IJ SOLN
INTRAMUSCULAR | Status: AC
  Filled 2021-01-22: qty 1

## 2021-01-22 MED ORDER — ONDANSETRON HCL 4 MG/2ML IJ SOLN: 4.0000 mg | Freq: Once | INTRAMUSCULAR | Status: DC | PRN

## 2021-01-22 MED ORDER — PROPOFOL 10 MG/ML IV BOLUS
INTRAVENOUS | Status: DC | PRN
  Administered 2021-01-22: 150 mg via INTRAVENOUS

## 2021-01-22 MED ORDER — ONDANSETRON HCL 4 MG/2ML IJ SOLN
INTRAMUSCULAR | Status: AC
  Filled 2021-01-22: qty 2

## 2021-01-22 MED ORDER — EPHEDRINE SULFATE 50 MG/ML IJ SOLN
INTRAMUSCULAR | Status: DC | PRN
  Administered 2021-01-22: 5 mg via INTRAVENOUS
  Administered 2021-01-22: 10 mg via INTRAVENOUS

## 2021-01-22 MED ORDER — LIDOCAINE 2% (20 MG/ML) 5 ML SYRINGE
INTRAMUSCULAR | Status: AC
  Filled 2021-01-22: qty 5

## 2021-01-22 MED ORDER — PHENYLEPHRINE 40 MCG/ML (10ML) SYRINGE FOR IV PUSH (FOR BLOOD PRESSURE SUPPORT)
PREFILLED_SYRINGE | INTRAVENOUS | Status: DC | PRN
  Administered 2021-01-22 (×2): 80 ug via INTRAVENOUS

## 2021-01-22 MED ORDER — FENTANYL CITRATE (PF) 100 MCG/2ML IJ SOLN: 25.0000 ug | INTRAMUSCULAR | Status: DC | PRN

## 2021-01-22 MED ORDER — LACTATED RINGERS IV SOLN: INTRAVENOUS | Status: DC | PRN

## 2021-01-22 SURGICAL SUPPLY — 19 items
BAG DRN RND TRDRP ANRFLXCHMBR (UROLOGICAL SUPPLIES) ×1
BAG URINE DRAIN 2000ML AR STRL (UROLOGICAL SUPPLIES) ×2 IMPLANT
CATH FOLEY 2WAY SLVR  5CC 16FR (CATHETERS)
CATH FOLEY 2WAY SLVR 5CC 16FR (CATHETERS) IMPLANT
CATH INTERMIT  6FR 70CM (CATHETERS) ×2 IMPLANT
GLOVE SURG ENC TEXT LTX SZ7.5 (GLOVE) ×2 IMPLANT
GOWN STRL REUS W/ TWL LRG LVL3 (GOWN DISPOSABLE) ×1 IMPLANT
GOWN STRL REUS W/ TWL XL LVL3 (GOWN DISPOSABLE) ×1 IMPLANT
GOWN STRL REUS W/TWL LRG LVL3 (GOWN DISPOSABLE) ×2
GOWN STRL REUS W/TWL XL LVL3 (GOWN DISPOSABLE) ×2
GUIDEWIRE STR DUAL SENSOR (WIRE) ×2 IMPLANT
KIT TURNOVER KIT B (KITS) ×2 IMPLANT
MANIFOLD NEPTUNE II (INSTRUMENTS) ×1 IMPLANT
PACK CYSTO (CUSTOM PROCEDURE TRAY) ×2 IMPLANT
STENT URET 6FRX26 CONTOUR (STENTS) ×1 IMPLANT
SYPHON OMNI JUG (MISCELLANEOUS) ×2 IMPLANT
TOWEL GREEN STERILE FF (TOWEL DISPOSABLE) ×2 IMPLANT
TUBE CONNECTING 12X1/4 (SUCTIONS) ×1 IMPLANT
WATER STERILE IRR 3000ML UROMA (IV SOLUTION) ×2 IMPLANT

## 2021-01-22 NOTE — Progress Notes (Signed)
Notified Dr. Roanna Banning of hgb of 6.4. no new orders .

## 2021-01-22 NOTE — Anesthesia Preprocedure Evaluation (Addendum)
Anesthesia Evaluation  Patient identified by MRN, date of birth, ID band Patient confused    Reviewed: Allergy & Precautions, NPO status , Patient's Chart, lab work & pertinent test results  Airway Mallampati: III  TM Distance: >3 FB Neck ROM: Full    Dental  (+) Poor Dentition, Chipped   Pulmonary former smoker,    Pulmonary exam normal breath sounds clear to auscultation       Cardiovascular hypertension, + CAD  + dysrhythmias Atrial Fibrillation + pacemaker  Rhythm:Irregular Rate:Normal  ECG: v-paced at 60   Neuro/Psych PSYCHIATRIC DISORDERS Depression Dementia negative neurological ROS     GI/Hepatic Neg liver ROS, GERD  Medicated,  Endo/Other  negative endocrine ROS  Renal/GU Renal disease     Musculoskeletal negative musculoskeletal ROS (+)   Abdominal   Peds  Hematology  (+) anemia , HLD   Anesthesia Other Findings ureterolithiasis  Reproductive/Obstetrics                            Anesthesia Physical Anesthesia Plan  ASA: 3  Anesthesia Plan: General   Post-op Pain Management:    Induction: Intravenous  PONV Risk Score and Plan: 2 and Ondansetron, Dexamethasone and Treatment may vary due to age or medical condition  Airway Management Planned: LMA  Additional Equipment:   Intra-op Plan:   Post-operative Plan: Extubation in OR  Informed Consent: I have reviewed the patients History and Physical, chart, labs and discussed the procedure including the risks, benefits and alternatives for the proposed anesthesia with the patient or authorized representative who has indicated his/her understanding and acceptance.   Patient has DNR.  Suspend DNR and Discussed DNR with power of attorney.   Consent reviewed with POA  Plan Discussed with: CRNA  Anesthesia Plan Comments: (Anesthetic plan discussed with wife via telephone)       Anesthesia Quick Evaluation

## 2021-01-22 NOTE — Assessment & Plan Note (Addendum)
--   Likely multifactorial including acute blood loss anemia, hypovolemia, suspected outpatient hypotension, left-sided nephrolithiasis with possible early or obstructive process, hydronephrosis.  Status post stent placement 7/24. -- No significant change in renal function.  Continue fluids, follow urine output as able although given dementia this has not been possible to record. -- BMP in AM, may take some time to improve

## 2021-01-22 NOTE — Progress Notes (Signed)
[name] Belts, RN 4E given report that the pt will be returning back to the floor until the surgery will take place.Pt AAOx1.

## 2021-01-22 NOTE — Assessment & Plan Note (Addendum)
--   Not on anticoagulation secondary to history of bleeding.  Appears stable. -- not on rate-control agent

## 2021-01-22 NOTE — Assessment & Plan Note (Signed)
--  continue Lipitor

## 2021-01-22 NOTE — Plan of Care (Signed)
Patient not in room (OR for urologic procedure).  [facility] will revisit tomorrow; please call me back if any further questions in the meantime.  Thanks.

## 2021-01-22 NOTE — Assessment & Plan Note (Addendum)
--   Secondary to diverticular bleed.  Hemoglobin responded appropriately to second transfusion.  -- no recurrent bleeding, Hgb stable.

## 2021-01-22 NOTE — Op Note (Signed)
Preoperative diagnosis: Large left renal pelvic stone with urinary tract infection  Postoperative diagnosis: Same  Principal procedure: Cystoscopy, left retrograde ureteropyelogram, fluoroscopic interpretation, placement of 6 French by 26 cm contour double-J stent without tether  Surgeon: Janaia Kozel  Anesthesia: General with LMA  Complications: None  Specimen: None  Estimated blood loss: None  Indications: 87 year old male with longstanding left renal pelvic stone.  He has had a few admissions for rectal bleeding most likely secondary to diverticulosis.  On this admission, he was found to have a urinary tract infection.  There is moderate, worsening hydronephrosis from this left renal pelvic stone.  Urologic consultation was requested.  We have recommended leaving the stone in place, temporarily or permanently stenting this left renal unit to allow for adequate drainage, as well as current antibiotic management for urinary tract infection.  We have discussed the procedure with the patient who is moderately demented as well as his family.  They desire to proceed with stent placement.  This can be done urgently, and will need to be changed every few months if he does well.  Findings: Urethra was normal.  Prostate moderately obstructive with bilobar hyperplasia.  Bladder displayed markedly trabeculated urothelium with cellules and small diverticuli.  No urothelial lesions were noted.  Left retrograde ureteropyelogram was performed through a normally configured and placed ureteral orifice.  This revealed the last 2 to 3 cm of the ureter to be normal.  Proximal to this there was significant widening, tortuosity, and a filling defect at a dilated UPJ/renal pelvis with pyelocaliectasis.  Description of procedure: The patient was properly identified and marked in the holding area.  He was taken to the operating room where general anesthetic was administered with the LMA.  He was placed in the  dorsolithotomy position.  Genitalia and perineum were prepped, draped, proper timeout performed.  73 French panendoscope was advanced under direct vision through his urethra with the above-mentioned findings.  Left retrograde ureteropyelogram was performed utilizing a 5 [place] open-ended catheter and Omnipaque with the above-mentioned findings.  Following this, sensor tip guidewire was advanced through the open-ended catheter, and fluoroscopically guided into the upper pole calyces past the stone.  Once this was identified, the open-ended catheter was removed.  I then slid a 6 [place] by 26 cm contour double-J stent over the top of the guidewire, properly positioned in the left ureter.  Following this, the guidewire was removed and excellent proximal and distal curls were seen using fluoroscopy and cystoscopy, respectively.  At this point, the bladder was drained.  The scope was removed and the patient awakened.  He was then taken to the PACU in stable condition.

## 2021-01-22 NOTE — Transfer of Care (Signed)
Immediate Anesthesia Transfer of Care Note  Patient: [name] [name]  Procedure(s) Performed: CYSTOSCOPY WITH RETROGRADE PYELOGRAM/URETERAL STENT PLACEMENT (Left)  Patient Location: PACU  Anesthesia Type:General  Level of Consciousness: sedated  Airway & Oxygen Therapy: Patient Spontanous Breathing and Patient connected to face mask oxygen  Post-op Assessment: Report given to RN and Post -op Vital signs reviewed and stable  Post vital signs: Reviewed and stable  Last Vitals:  Vitals Value Taken Time  BP 118/81 01/22/21 1529  Temp    Pulse 66 01/22/21 1531  Resp 16 01/22/21 1531  SpO2 100 % 01/22/21 1531  Vitals shown include unvalidated device data.  Last Pain:  Vitals:   01/22/21 1236  TempSrc: Oral  PainSc:          Complications: No notable events documented.

## 2021-01-22 NOTE — Congregational Nurse Program (Signed)
Pt found climbing out of bed stating he needed to have a bowel movement, even though he was unaware that it had started to come out. Medium size liquid consistency of red-black tarry stool visualized. Pt cleaned up and advised to call before getting out of bed. Side rails up x4 and call bell within reach.

## 2021-01-22 NOTE — Assessment & Plan Note (Addendum)
--   culture data unrevealing (yeast).  Completed 3 days antibiotic.

## 2021-01-22 NOTE — Assessment & Plan Note (Signed)
--   Status post left ureteral stent placement to decrease risk of developing pyelonephritis or urosepsis. --appointment scheduled with Dr. Lovena Neighbours on January 31, 2021 and will practice for BPH.

## 2021-01-22 NOTE — Progress Notes (Signed)
PROGRESS NOTE  [name] [name] QIW:[id number] DOB: [birth year] DOA: 01/20/2021 PCP: [name], MD  Brief History   87 year old man PMH permanent atrial fibrillation not on anticoagulation secondary to history of bleeding, known diverticulosis, diverticular bleed 2011, presented with 24 hours of diarrhea and dark stools.  Admitted for severe anemia secondary to GI bleed.  A & P  * Acute GI bleeding -- Presumed diverticular in nature.  Seen by gastroenterology.  Conservative management recommended. -- No further bleeding but hemoglobin remains low despite 3 units PRBC likely equilibration. Will transfuse further -- If patient has recurrent bleeding consider tagged RBC study  ABLA (acute blood loss anemia) -- Severe anemia, persistent despite 3 units PRBC, likely reflecting under estimated blood loss by initial CBC.  No evidence of ongoing bleeding.  Transfuse additional 2 units PRBC and follow.  Renal stone -- Status post left ureteral stent placement to decrease risk of developing pyelonephritis or urosepsis. --appointment scheduled with Dr. Lovena Neighbours on January 31, 2021 and will practice for BPH.  Acute kidney injury superimposed on CKD stage IIIb (HCC) -- Likely multifactorial including acute blood loss anemia, hypovolemia, suspected outpatient hypotension, left-sided nephrolithiasis with possible early or obstructive process.  Now status post stent placement. --No significant change today, follow volume status, follow urine output, will get blood today, recheck in a.m.  Acute lower UTI -- Follow-up culture data unrevealing (yeast).  Continue empiric antibiotic.  BPH (benign prostatic hyperplasia) --continue finasteride  Alzheimer's disease ([place]) --appears stable --continue donepezil  Permanent atrial fibrillation ([place]) -- Not on anticoagulation secondary to history of bleeding.  Appears stable. -- not on rate-control agent  Aortic atherosclerosis (HCC) --continue  atorvastatin  Hypercholesterolemia --continue Lipitor  Coronary artery disease involving native coronary artery of native heart without angina pectoris --stable --continue Lipitor, resume ASA when able  Disposition Plan:  Discussion:   Status is: Inpatient  Remains inpatient appropriate because:IV treatments appropriate due to intensity of illness or inability to take PO and Inpatient level of care appropriate due to severity of illness  Dispo: The patient is from: Home              Anticipated d/c is to: Home              Patient currently is not medically stable to d/c.   Difficult to place patient No  DVT prophylaxis: SCDs Start: 01/20/21 2238   Code Status: DNR Level of care: Progressive Family Communication: daughter at bedside  Murray Hodgkins, MD  [facility] Direct contact: see [email or web address] (further directions at bottom of note if needed) 7PM-7AM contact night coverage as at bottom of note 01/22/2021, 4:30 PM  LOS: 2 days    Consults:  GI Urology   Interval History/Subjective  CC: f/u acute GIB bleed  Feels well, no complaints No bleeding noted now  Objective   Vitals:  Vitals:   01/22/21 1545 01/22/21 1600  BP: 114/71 114/69  Pulse: 66 66  Resp: 18 18  Temp:  97.7 F (36.5 C)  SpO2: 100% 100%    Exam: Physical Exam Vitals and nursing note reviewed.  Constitutional:      General: He is not in acute distress.    Appearance: He is not ill-appearing or toxic-appearing.  Cardiovascular:     Rate and Rhythm: Normal rate and regular rhythm.     Heart sounds: No murmur heard.   No friction rub. No gallop.     Comments: Telemetry paced rhythm Pulmonary:     Effort: Pulmonary  effort is normal.     Breath sounds: Normal breath sounds.  Musculoskeletal:     Right lower leg: No edema.     Left lower leg: No edema.  Neurological:     Mental Status: He is alert.  Psychiatric:        Mood and Affect: Mood normal.        Behavior: Behavior  normal.    I have personally reviewed the labs and other data, making special note of:   Today's Data  CBG stable  Creatinine without significant change, 3.02 Hemoglobin 6.4, no change despite 3 units PRBC   Scheduled Meds:  sodium chloride   Intravenous Once   sodium chloride   Intravenous Once   sodium chloride   Intravenous Once   atorvastatin  40 mg Oral QPM   cholecalciferol  2,000 Units Oral QHS   donepezil  5 mg Oral BID   ferrous sulfate  325 mg Oral Daily   finasteride  5 mg Oral QPM   pantoprazole (PROTONIX) IV  40 mg Intravenous Q12H   sertraline  25 mg Oral Daily   Continuous Infusions:  sodium chloride 75 mL/hr at 01/22/21 0542   cefTRIAXone (ROCEPHIN)  IV 1 g (01/22/21 0030)    Principal Problem:   Acute GI bleeding Active Problems:   ABLA (acute blood loss anemia)   Acute lower UTI   Acute kidney injury superimposed on CKD stage IIIb (HCC)   Renal stone   Permanent atrial fibrillation (HCC)   Alzheimer's disease (HCC)   BPH (benign prostatic hyperplasia)   Coronary artery disease involving native coronary artery of native heart without angina pectoris   Hypercholesterolemia   Aortic atherosclerosis ([place])   LOS: 2 days   How to contact the [facility] Attending or Consulting provider [place] or covering provider during after hours 7P -7A, for this patient?  Check the care team in [facility] and look for a) attending/consulting TRH provider listed and b) the [facility] team listed Log into [email or web address] and use 's universal password to access. If you do not have the password, please contact the hospital operator. Locate the [facility] provider you are looking for under [facility] and page to a number that you can be directly reached. If you still have difficulty reaching the provider, please page the [facility] (Director on Call) for the Hospitalists listed on amion for assistance.

## 2021-01-22 NOTE — Assessment & Plan Note (Addendum)
--   Presumed diverticular in nature.  Seen by gastroenterology.  Conservative management recommended. -- No further bleeding.  Hemoglobin now improved.  RBC tagged study for recurrent bleeding or angiogram if unstable.

## 2021-01-22 NOTE — Assessment & Plan Note (Addendum)
--   With behavioral disturbance, continue donepezil.  Ativan as needed.

## 2021-01-22 NOTE — Assessment & Plan Note (Signed)
--  stable --continue Lipitor, resume ASA when able

## 2021-01-22 NOTE — Assessment & Plan Note (Signed)
--  continue finasteride  °

## 2021-01-22 NOTE — Anesthesia Postprocedure Evaluation (Signed)
Anesthesia Post Note  Patient: [name] [name]  Procedure(s) Performed: CYSTOSCOPY WITH RETROGRADE PYELOGRAM/URETERAL STENT PLACEMENT (Left)     Patient location during evaluation: PACU Anesthesia Type: General Level of consciousness: awake Pain management: pain level controlled Vital Signs Assessment: post-procedure vital signs reviewed and stable Respiratory status: spontaneous breathing, nonlabored ventilation, respiratory function stable and patient connected to nasal cannula oxygen Cardiovascular status: blood pressure returned to baseline and stable Postop Assessment: no apparent nausea or vomiting Anesthetic complications: no   No notable events documented.  Last Vitals:  Vitals:   01/22/21 1600 01/22/21 1705  BP: 114/69 125/61  Pulse: 66 65  Resp: 18 18  Temp: 36.5 C (!) 35.7 C  SpO2: 100% 100%    Last Pain:  Vitals:   01/22/21 1705  TempSrc: Axillary  PainSc:                  Karyl Kinnier Samel Bruna

## 2021-01-22 NOTE — Hospital Course (Addendum)
87 year old man PMH permanent atrial fibrillation not on anticoagulation secondary to history of bleeding, known diverticulosis, diverticular bleed 2011, presented with 24 hours of diarrhea and dark stools.  Admitted for severe anemia secondary to GI bleed, AKI, also found to have ureterolithiasis.  Seen by gastroenterology, recommendation for conservative management.  No apparent ongoing bleeding.  Transfused PRBC for ABLA with appropriate rise in hemoglobin.  Seen by urology and underwent stent placement.  AKI with modest improvement only at this point.  Continue fluids, repeat BMP in AM.  Plan for SNF when stable.

## 2021-01-22 NOTE — Anesthesia Procedure Notes (Signed)
Procedure Name: LMA Insertion Date/Time: 01/22/2021 1:48 PM Performed by: Asher Muir, CRNA Pre-anesthesia Checklist: Patient being monitored, Patient identified, Emergency Drugs available and Suction available Patient Re-evaluated:Patient Re-evaluated prior to induction Oxygen Delivery Method: Circle system utilized Preoxygenation: Pre-oxygenation with 100% oxygen Induction Type: IV induction Ventilation: Mask ventilation with difficulty LMA: LMA inserted LMA Size: 5.0 Number of attempts: 1 Dental Injury: Teeth and Oropharynx as per pre-operative assessment

## 2021-01-22 NOTE — Assessment & Plan Note (Signed)
-   continue atorvastatin

## 2021-01-23 ENCOUNTER — Encounter (HOSPITAL_COMMUNITY): Payer: Self-pay | Admitting: Urology

## 2021-01-23 DIAGNOSIS — D62 Acute posthemorrhagic anemia: Secondary | ICD-10-CM | POA: Diagnosis not present

## 2021-01-23 DIAGNOSIS — N179 Acute kidney failure, unspecified: Secondary | ICD-10-CM | POA: Diagnosis not present

## 2021-01-23 DIAGNOSIS — N390 Urinary tract infection, site not specified: Secondary | ICD-10-CM

## 2021-01-23 DIAGNOSIS — K922 Gastrointestinal hemorrhage, unspecified: Secondary | ICD-10-CM | POA: Diagnosis not present

## 2021-01-23 LAB — TYPE AND SCREEN
ABO/RH(D): O POS
Antibody Screen: NEGATIVE
Unit division: 0
Unit division: 0
Unit division: 0
Unit division: 0
Unit division: 0

## 2021-01-23 LAB — BPAM RBC
Blood Product Expiration Date: 202208232359
Blood Product Expiration Date: 202208242359
Blood Product Expiration Date: 202208252359
Blood Product Expiration Date: 202208262359
Blood Product Expiration Date: 202208262359
ISSUE DATE / TIME: 202207222215
ISSUE DATE / TIME: 202207230820
ISSUE DATE / TIME: 202207231812
ISSUE DATE / TIME: 202207241715
ISSUE DATE / TIME: 202207242103
Unit Type and Rh: 5100
Unit Type and Rh: 5100
Unit Type and Rh: 5100
Unit Type and Rh: 5100
Unit Type and Rh: 5100

## 2021-01-23 LAB — BASIC METABOLIC PANEL
Anion gap: 7 (ref 5–15)
BUN: 96 mg/dL — ABNORMAL HIGH (ref 8–23)
CO2: 16 mmol/L — ABNORMAL LOW (ref 22–32)
Calcium: 8.5 mg/dL — ABNORMAL LOW (ref 8.9–10.3)
Chloride: 116 mmol/L — ABNORMAL HIGH (ref 98–111)
Creatinine, Ser: 2.88 mg/dL — ABNORMAL HIGH (ref 0.61–1.24)
GFR, Estimated: 20 mL/min — ABNORMAL LOW (ref >60)
Glucose, Bld: 107 mg/dL — ABNORMAL HIGH (ref 70–99)
Potassium: 3.6 mmol/L (ref 3.5–5.1)
Sodium: 139 mmol/L (ref 135–145)

## 2021-01-23 LAB — CBC
HCT: 27.3 % — ABNORMAL LOW (ref 39.0–52.0)
Hemoglobin: 8.9 g/dL — ABNORMAL LOW (ref 13.0–17.0)
MCH: 30.9 pg (ref 26.0–34.0)
MCHC: 32.6 g/dL (ref 30.0–36.0)
MCV: 94.8 fL (ref 80.0–100.0)
Platelets: 122 10*3/uL — ABNORMAL LOW (ref 150–400)
RBC: 2.88 MIL/uL — ABNORMAL LOW (ref 4.22–5.81)
RDW: 15.4 % (ref 11.5–15.5)
WBC: 9.8 10*3/uL (ref 4.0–10.5)
nRBC: 0.2 % (ref 0.0–0.2)

## 2021-01-23 MED ORDER — ORAL CARE MOUTH RINSE
15.0000 mL | Freq: Two times a day (BID) | OROMUCOSAL | Status: DC
  Administered 2021-01-24 (×2): 15 mL via OROMUCOSAL

## 2021-01-23 MED ORDER — CHLORHEXIDINE GLUCONATE 0.12 % MT SOLN
15.0000 mL | Freq: Two times a day (BID) | OROMUCOSAL | Status: DC
  Administered 2021-01-23 – 2021-01-28 (×5): 15 mL via OROMUCOSAL
  Filled 2021-01-23 (×10): qty 15

## 2021-01-23 NOTE — Progress Notes (Addendum)
PROGRESS NOTE  [name] [name] BWL:[id number] DOB: [birth year] DOA: 01/20/2021 PCP: [name], MD  Brief History   87 year old man PMH permanent atrial fibrillation not on anticoagulation secondary to history of bleeding, known diverticulosis, diverticular bleed 2011, presented with 24 hours of diarrhea and dark stools.  Admitted for severe anemia secondary to GI bleed, AKI, also found to have ureterolithiasis.  Seen by gastroenterology, recommendation for conservative management.  No apparent ongoing bleeding.  Transfused PRBC for ABLA with appropriate rise in hemoglobin.  Seen by urology and underwent stent placement.  AKI with modest improvement only at this point.  A & P  * Acute GI bleeding -- Presumed diverticular in nature.  Seen by gastroenterology.  Conservative management recommended. -- No further bleeding.  Hemoglobin now improved.  RBC tagged study for recurrent bleeding or angiogram if unstable.  ABLA (acute blood loss anemia) -- Secondary to diverticular bleed.  Hemoglobin responded appropriately to second transfusion.  Monitor for recurrent bleeding, check CBC in AM.  Renal stone -- Status post left ureteral stent placement to decrease risk of developing pyelonephritis or urosepsis. --appointment scheduled with Dr. Lovena Neighbours on January 31, 2021 and will practice for BPH.  Acute kidney injury superimposed on CKD stage IIIb (HCC) -- Likely multifactorial including acute blood loss anemia, hypovolemia, suspected outpatient hypotension, left-sided nephrolithiasis with possible early or obstructive process.  Status post stent placement 7/24. --Mildly improved today.  Urine output probably adequate given unrecorded voids.  Acute lower UTI -- Follow-up culture data unrevealing (yeast).  Completed 3 days antibiotic.  BPH (benign prostatic hyperplasia) --continue finasteride  Alzheimer's disease ([place]) --appears stable --continue donepezil  Permanent atrial fibrillation ([place]) --  Not on anticoagulation secondary to history of bleeding.  Appears stable. -- not on rate-control agent  Aortic atherosclerosis (HCC) --continue atorvastatin  Hypercholesterolemia --continue Lipitor  Coronary artery disease involving native coronary artery of native heart without angina pectoris --stable --continue Lipitor, resume ASA when able  Disposition Plan:  Discussion: Clinically improved.  Renal function slow to improve.  Once renal function clearly improved can discharge to SNF.  Status is: Inpatient  Remains inpatient appropriate because:IV treatments appropriate due to intensity of illness or inability to take PO and Inpatient level of care appropriate due to severity of illness  Dispo: The patient is from: Home              Anticipated d/c is to: Home              Patient currently is not medically stable to d/c.   Difficult to place patient No  DVT prophylaxis: SCDs Start: 01/20/21 2238   Code Status: DNR Level of care: Progressive Family Communication: daughter and son at bedside.  Murray Hodgkins, MD  [facility] Direct contact: see [email or web address] (further directions at bottom of note if needed) 7PM-7AM contact night coverage as at bottom of note 01/23/2021, 4:41 PM  LOS: 3 days    Consults:  GI Urology   Interval History/Subjective  CC: f/u acute GIB bleed  Feels fine Son and daughter at bedside, report confused now but was doing well this morning.  Objective   Vitals:  Vitals:   01/23/21 0750 01/23/21 1322  BP: (!) 148/73 (!) 100/58  Pulse: 73 66  Resp: 18 18  Temp: 97.6 F (36.4 C) 97.6 F (36.4 C)  SpO2: 100% 100%    Exam: Physical Exam Vitals reviewed.  Constitutional:      General: He is not in acute distress.  Appearance: He is not ill-appearing or toxic-appearing.  Cardiovascular:     Rate and Rhythm: Normal rate and regular rhythm.     Heart sounds: No murmur heard. Pulmonary:     Effort: Pulmonary effort is normal.      Breath sounds: Normal breath sounds. No wheezing, rhonchi or rales.  Abdominal:     General: There is no distension.     Tenderness: There is no abdominal tenderness. There is no guarding.  Musculoskeletal:     Right lower leg: No edema.     Left lower leg: No edema.  Neurological:     Mental Status: He is alert.  Psychiatric:        Mood and Affect: Mood normal.     Comments: Mildly confused. Calm.   I have personally reviewed the labs and other data, making special note of:   Today's Data  BUN better at 96, creatinine slightly better at 2.88 Hemoglobin up appropriately to 8.9 status posttransfusion Platelets slightly lower at 122   Scheduled Meds:  atorvastatin  40 mg Oral QPM   chlorhexidine  15 mL Mouth Rinse BID   cholecalciferol  2,000 Units Oral QHS   donepezil  5 mg Oral BID   ferrous sulfate  325 mg Oral Daily   finasteride  5 mg Oral QPM   mouth rinse  15 mL Mouth Rinse q12n4p   pantoprazole (PROTONIX) IV  40 mg Intravenous Q12H   sertraline  25 mg Oral Daily   Continuous Infusions:  sodium chloride 75 mL/hr at 01/23/21 0600   cefTRIAXone (ROCEPHIN)  IV Stopped (01/22/21 2345)    Principal Problem:   Acute GI bleeding Active Problems:   ABLA (acute blood loss anemia)   Acute lower UTI   Acute kidney injury superimposed on CKD stage IIIb (HCC)   Renal stone   Permanent atrial fibrillation (HCC)   Alzheimer's disease (HCC)   BPH (benign prostatic hyperplasia)   Coronary artery disease involving native coronary artery of native heart without angina pectoris   Hypercholesterolemia   Aortic atherosclerosis ([place])   LOS: 3 days   How to contact the [facility] Attending or Consulting provider [place] or covering provider during after hours 7P -7A, for this patient?  Check the care team in [facility] and look for a) attending/consulting TRH provider listed and b) the [facility] team listed Log into [email or web address] and use [place]'s universal password to access. If you do not have  the password, please contact the hospital operator. Locate the [facility] provider you are looking for under [facility] and page to a number that you can be directly reached. If you still have difficulty reaching the provider, please page the [facility] (Director on Call) for the Hospitalists listed on amion for assistance.

## 2021-01-23 NOTE — TOC Initial Note (Signed)
Transition of Care [facility]) - Initial/Assessment Note    Patient Details  Name: [name] [name] MRN: [id number] Date of Birth: 07/28/1925  Transition of Care [facility]) CM/SW Contact:    Vinie Sill, LCSW Phone Number: 01/23/2021, 3:11 PM  Clinical Narrative:                  CSW spoke with patient's wife,[name]. CSW introduced self and explained role. Patient wife expressed she believes the patient would benefits from short term rehab at [facility] before discharging home. He has been to [facility] before and requested to send referral to [facility]. She was agreeable to sending to other SNFs as back up plan. CSW explained the SNF process and advised waiting on PT/OT recommendations. She states she understands. CSW was given permission to start the SNF process while waiting on recommendations. No questions or concerns noted at this time.   CSW will continue to follow and assist with discharge planning.  Thurmond Butts, MSW, LCSW Clinical Social Worker    Expected Discharge Plan: Skilled Nursing Facility Barriers to Discharge: Continued Medical Work up, Other (must enter comment), SNF Pending bed offer (PT/OT recommendations)   Patient Goals and CMS Choice        Expected Discharge Plan and Services Expected Discharge Plan: [place] In-house Referral: Clinical Social Work     Living arrangements for the past 2 months: Single Family Home                                      Prior Living Arrangements/Services Living arrangements for the past 2 months: Single Family Home Lives with:: Self, Spouse Patient language and need for interpreter reviewed:: No        Need for Family Participation in Patient Care: Yes (Comment) Care giver support system in place?: Yes (comment)   Criminal Activity/Legal Involvement Pertinent to Current Situation/Hospitalization: No - Comment as needed  Activities of Daily Living      Permission Sought/Granted Permission  sought to share information with : Family Supports    Share Information with NAME: Fiallos,[name]  Permission granted to share info w AGENCY: snfS     Permission granted to share info w Contact Information: [phone]  Emotional Assessment         Alcohol / Substance Use: Not Applicable Psych Involvement: No (comment)  Admission diagnosis:  Acute GI bleeding [K92.2] Acute cystitis with hematuria [N30.01] AKI (acute kidney injury) ([place]) [N17.9] Anemia, unspecified type [D64.9] Patient Active Problem List   Diagnosis Date Noted   Renal stone 01/22/2021   Aortic atherosclerosis ([place]) 01/22/2021   Acute GI bleeding 01/20/2021   Acute lower UTI 01/20/2021   Acute kidney injury superimposed on CKD stage IIIb ([place]) 01/20/2021   Macrocytic anemia 38/93/7342   Alcoholic cirrhosis ([place]) 87/68/1157   Allergic rhinitis 12/28/2020   Chronic ischemic heart disease 12/28/2020   History of gastrointestinal disease 12/28/2020   BPH (benign prostatic hyperplasia) 12/28/2020   Pulmonary insufficiency following trauma and surgery 12/28/2020   Spinal stenosis of lumbar region without neurogenic claudication 12/28/2020   Unspecified persistent mental disorders due to conditions classified elsewhere 12/28/2020   Hip fracture requiring operative repair ([place]) 08/29/2020   Dysphagia 05/19/2020   Cough 05/18/2020   GERD (gastroesophageal reflux disease) 04/22/2020   Weight loss 04/20/2020   Slow transit constipation 04/20/2020   CKD (chronic kidney disease) stage 3, GFR 30-59 ml/min ([place])  04/06/2020   Right hip pain 04/04/2020   Depression, recurrent ([place]) 04/04/2020   Malaise 04/04/2020   Acquired thrombophilia ([place]) 03/23/2020   Hip fracture ([place]) 03/21/2020   DNR (do not resuscitate) 03/21/2020   Urinary urgency 01/01/2019   Candidiasis 01/01/2019   Closed displaced intertrochanteric fracture of left femur ([place]) 12/25/2018   Closed intertrochanteric fracture of hip, left, initial  encounter ([place]) 12/25/2018   Pain due to onychomycosis of toenails of both feet 12/12/2018   ABLA (acute blood loss anemia) 06/08/2018   GI bleed 06/05/2018   HTN (hypertension)    High cholesterol    Diverticulosis    Lower GI bleed    AAA (abdominal aortic aneurysm) without rupture ([place]) 01/09/2018   Ascending aortic aneurysm ([place]) 01/09/2018   Heart block AV second degree 07/15/2017   Status post three vessel coronary artery bypass 07/15/2017   Left ventricular systolic dysfunction without heart failure 05/11/2017   Hypercholesterolemia 05/11/2017   CHB (complete heart block) ([place]) 06/07/2015   Alzheimer's disease ([place]) 01/26/2015   Pacemaker 01/26/2013   Permanent atrial fibrillation ([place]) 01/26/2013   Second degree AV block, Mobitz type II 01/26/2013   Coronary artery disease involving native coronary artery of native heart without angina pectoris 01/26/2013   Dyspnea 01/26/2013   PCP:  [name], MD Pharmacy:   [facility] [place], [place] - [address] [place] [address] Phone: [phone] Fax: [place], [place] - [address] [place] [address] Phone: [phone] Fax: [phone]     Social Determinants of Health (SDOH) Interventions    Readmission Risk Interventions No flowsheet data found.

## 2021-01-23 NOTE — TOC Progression Note (Signed)
Transition of Care [facility]) - Progression Note    Patient Details  Name: [name] [name] MRN: [id number] Date of Birth: February 21, 1926  Transition of Care [facility]) CM/SW [place], [place] Phone Number: 01/23/2021, 3:38 PM  Clinical Narrative:     CSW talked with Jordan w/[facility]- they have confirmed they patient's information and will review - will confirm once PT/OT recommendations are made and clinicals has been reviewed. CSW will keep SNF updated and inform when medically stable for discharge.  CSW will continue to follow and assist with discharge planning.  Thurmond Butts, MSW, LCSW Clinical Social Worker    Expected Discharge Plan: Skilled Nursing Facility Barriers to Discharge: Continued Medical Work up, Other (must enter comment), SNF Pending bed offer (PT/OT recommendations)  Expected Discharge Plan and Services Expected Discharge Plan: [place] In-house Referral: Clinical Social Work     Living arrangements for the past 2 months: Single Family Home                                       Social Determinants of Health (SDOH) Interventions    Readmission Risk Interventions No flowsheet data found.

## 2021-01-23 NOTE — Progress Notes (Signed)
[facility] Gastroenterology Progress Note  [name] [name] 87 y.o. [birth year]  CC:  Hematochezia  Subjective: Patient denies any abdominal pain or nausea. Had a brown stool today per RN.  Blackish stools yesterday.  ROS : Review of Systems  Cardiovascular:  Negative for chest pain and palpitations.  Gastrointestinal:  Negative for abdominal pain, blood in stool, constipation, diarrhea, heartburn, melena, nausea and vomiting.   Objective: Vital signs in last 24 hours: Vitals:   01/23/21 0315 01/23/21 0750  BP: (!) 111/59 (!) 148/73  Pulse: 66 73  Resp: 17 18  Temp: (!) 96.4 F (35.8 C) 97.6 F (36.4 C)  SpO2: 99% 100%    Physical Exam:  General:  Lethargic, elderly, cooperative, no distress  Head:  Normocephalic, without obvious abnormality, atraumatic  Eyes:  Anicteric sclera, EOM\s intact  Lungs:   Clear to auscultation bilaterally, respirations unlabored  Heart:  Regular rate and rhythm, S1, S2 normal  Abdomen:   Soft, non-tender, non-distended, bowel sounds active all four quadrants  Extremities: Extremities normal, atraumatic, no  edema    Lab Results: Recent Labs    01/21/21 0428 01/22/21 0533 01/23/21 0043  NA 138 138 139  K 4.0 4.7 3.6  CL 114* 114* 116*  CO2 15* 14* 16*  GLUCOSE 95 103* 107*  BUN 97* 109* 96*  CREATININE 2.75* 3.02* 2.88*  CALCIUM 7.4* 8.6* 8.5*  PHOS 3.1 3.8  --    Recent Labs    01/20/21 2035 01/21/21 0428 01/22/21 0533  AST 21  --   --   ALT 13  --   --   ALKPHOS 71  --   --   BILITOT 0.2*  --   --   PROT 6.4*  --   --   ALBUMIN 2.6* 2.0* 2.0*   Recent Labs    01/20/21 2035 01/21/21 0427 01/22/21 0533 01/23/21 0043  WBC 7.0   < > 8.4 9.8  NEUTROABS 5.5  --   --   --   HGB 7.2*   < > 6.4* 8.9*  HCT 22.8*   < > 20.4* 27.3*  MCV 103.6*   < > 97.1 94.8  PLT 212   < > 136* 122*   < > = values in this interval not displayed.   No results for input(s): LABPROT, INR in the last 72 hours.    Assessment: Hematochezia,  presumably diverticular bleeding.  Blackish stools, followed by brown stools suggestive of cessation of bleeding. -Hgb 8.9, improved from 6.4 after 2u pRBCs on 7/24  Plan: Soft diet.    Hopefully, if patient remains stable, he can be discharged over the next couple of days.  If re-bleeding occurs, consider nuclear bleeding scan vs. CT-A (if unstable).  [facility] will sign off. Please contact us if we can be of any further assistance during this hospital stay.  Salley Slaughter PA-C 01/23/2021, 11:03 AM  Contact #  [phone]

## 2021-01-23 NOTE — NC FL2 (Signed)
[place] LEVEL OF CARE SCREENING TOOL     IDENTIFICATION  Patient Name: [name] [name] Birthdate: 08/31/1925 Sex: male Admission Date (Current Location): 01/20/2021  [facility] and [place] Number:  Herbalist and Address:  [facility], [place] [address] [place],  [address]      Provider Number: [id number]  Attending Physician Name and Address:  Samuella Cota, MD  Relative Name and Phone Number:       Current Level of Care: Hospital Recommended Level of Care: [place] Prior Approval Number:    Date Approved/Denied:   PASRR Number: [id number]  Discharge Plan: SNF    Current Diagnoses: Patient Active Problem List   Diagnosis Date Noted   Renal stone 01/22/2021   Aortic atherosclerosis ([place]) 01/22/2021   Acute GI bleeding 01/20/2021   Acute lower UTI 01/20/2021   Acute kidney injury superimposed on CKD stage IIIb ([place]) 01/20/2021   Macrocytic anemia 00/37/0488   Alcoholic cirrhosis ([place]) 89/16/9450   Allergic rhinitis 12/28/2020   Chronic ischemic heart disease 12/28/2020   History of gastrointestinal disease 12/28/2020   BPH (benign prostatic hyperplasia) 12/28/2020   Pulmonary insufficiency following trauma and surgery 12/28/2020   Spinal stenosis of lumbar region without neurogenic claudication 12/28/2020   Unspecified persistent mental disorders due to conditions classified elsewhere 12/28/2020   Hip fracture requiring operative repair ([place]) 08/29/2020   Dysphagia 05/19/2020   Cough 05/18/2020   GERD (gastroesophageal reflux disease) 04/22/2020   Weight loss 04/20/2020   Slow transit constipation 04/20/2020   CKD (chronic kidney disease) stage 3, GFR 30-59 ml/min ([place]) 04/06/2020   Right hip pain 04/04/2020   Depression, recurrent ([place]) 04/04/2020   Malaise 04/04/2020   Acquired thrombophilia ([place]) 03/23/2020   Hip fracture ([place]) 03/21/2020   DNR (do not resuscitate) 03/21/2020    Urinary urgency 01/01/2019   Candidiasis 01/01/2019   Closed displaced intertrochanteric fracture of left femur ([place]) 12/25/2018   Closed intertrochanteric fracture of hip, left, initial encounter ([place]) 12/25/2018   Pain due to onychomycosis of toenails of both feet 12/12/2018   ABLA (acute blood loss anemia) 06/08/2018   GI bleed 06/05/2018   HTN (hypertension)    High cholesterol    Diverticulosis    Lower GI bleed    AAA (abdominal aortic aneurysm) without rupture ([place]) 01/09/2018   Ascending aortic aneurysm ([place]) 01/09/2018   Heart block AV second degree 07/15/2017   Status post three vessel coronary artery bypass 07/15/2017   Left ventricular systolic dysfunction without heart failure 05/11/2017   Hypercholesterolemia 05/11/2017   CHB (complete heart block) ([place]) 06/07/2015   Alzheimer's disease ([place]) 01/26/2015   Pacemaker 01/26/2013   Permanent atrial fibrillation ([place]) 01/26/2013   Second degree AV block, Mobitz type II 01/26/2013   Coronary artery disease involving native coronary artery of native heart without angina pectoris 01/26/2013   Dyspnea 01/26/2013    Orientation RESPIRATION BLADDER Height & Weight     Self  Normal Continent Weight: 150 lb 5.7 oz (68.2 kg) Height:  6' (182.9 cm)  BEHAVIORAL SYMPTOMS/MOOD NEUROLOGICAL BOWEL NUTRITION STATUS      Incontinent Diet (please see discharge summary)  AMBULATORY STATUS COMMUNICATION OF NEEDS Skin   Limited Assist Verbally Other (Comment) (Eccymosis,Arm Bilateral)                       Personal Care Assistance Level of Assistance  Bathing, Feeding, Dressing Bathing Assistance: Maximum assistance Feeding assistance: Limited  assistance Dressing Assistance: Maximum assistance     Functional Limitations Info  Sight, Hearing, Speech Sight Info: Impaired Hearing Info: Impaired Speech Info: Adequate    SPECIAL CARE FACTORS FREQUENCY  PT (By licensed PT), OT (By licensed OT)     PT Frequency: 5x per week OT  Frequency: 5x per week            Contractures Contractures Info: Not present    Additional Factors Info  Code Status, Allergies Code Status Info: DNR Allergies Info: Amiodarone,Antihistamines -Diphenhydramine-type,Lovastatin           Current Medications (01/23/2021):  This is the current hospital active medication list Current Facility-Administered Medications  Medication Dose Route Frequency Provider Last Rate Last Admin   0.9 %  sodium chloride infusion   Intravenous Continuous Samuella Cota, MD 75 mL/hr at 01/23/21 0600 Infusion Verify at 01/23/21 0600   acetaminophen (TYLENOL) tablet 650 mg  650 mg Oral Q6H PRN Franchot Gallo, MD   650 mg at 01/21/21 1627   Or   acetaminophen (TYLENOL) suppository 650 mg  650 mg Rectal Q6H PRN Franchot Gallo, MD       atorvastatin (LIPITOR) tablet 40 mg  40 mg Oral QPM Franchot Gallo, MD   40 mg at 01/22/21 1818   cefTRIAXone (ROCEPHIN) 1 g in sodium chloride 0.9 % 100 mL IVPB  1 g Intravenous Q24H Franchot Gallo, MD   Stopped at 01/22/21 2345   chlorhexidine (PERIDEX) 0.12 % solution 15 mL  15 mL Mouth Rinse BID Samuella Cota, MD       cholecalciferol (VITAMIN D3) tablet 2,000 Units  2,000 Units Oral Hewitt Shorts, MD   2,000 Units at 01/22/21 2304   donepezil (ARICEPT) tablet 5 mg  5 mg Oral BID Franchot Gallo, MD   5 mg at 01/23/21 1028   ferrous sulfate tablet 325 mg  325 mg Oral Daily Franchot Gallo, MD   325 mg at 01/23/21 1028   finasteride (PROSCAR) tablet 5 mg  5 mg Oral QPM Franchot Gallo, MD   5 mg at 01/22/21 1818   MEDLINE mouth rinse  15 mL Mouth Rinse q12n4p Samuella Cota, MD       ondansetron [facility]) tablet 4 mg  4 mg Oral Q6H PRN Franchot Gallo, MD       Or   ondansetron [facility]) injection 4 mg  4 mg Intravenous Q6H PRN Franchot Gallo, MD       pantoprazole (PROTONIX) injection 40 mg  40 mg Intravenous Theodoro Doing, MD   40 mg at 01/23/21 1028    sertraline (ZOLOFT) tablet 25 mg  25 mg Oral Daily Franchot Gallo, MD   25 mg at 01/23/21 1028     Discharge Medications: Please see discharge summary for a list of discharge medications.  Relevant Imaging Results:  Relevant Lab Results:   Additional Information SSN [id number] [place] COVID-19 Vaccine 04/27/2020 , 08/07/2019 , 07/17/2019, 10/26/2020  Vinie Sill, LCSW

## 2021-01-24 DIAGNOSIS — N179 Acute kidney failure, unspecified: Secondary | ICD-10-CM | POA: Diagnosis not present

## 2021-01-24 DIAGNOSIS — N1330 Unspecified hydronephrosis: Secondary | ICD-10-CM

## 2021-01-24 DIAGNOSIS — R410 Disorientation, unspecified: Secondary | ICD-10-CM | POA: Diagnosis not present

## 2021-01-24 DIAGNOSIS — G309 Alzheimer's disease, unspecified: Secondary | ICD-10-CM | POA: Diagnosis not present

## 2021-01-24 DIAGNOSIS — K922 Gastrointestinal hemorrhage, unspecified: Secondary | ICD-10-CM | POA: Diagnosis not present

## 2021-01-24 LAB — CBC
HCT: 27.4 % — ABNORMAL LOW (ref 39.0–52.0)
Hemoglobin: 9 g/dL — ABNORMAL LOW (ref 13.0–17.0)
MCH: 31.6 pg (ref 26.0–34.0)
MCHC: 32.8 g/dL (ref 30.0–36.0)
MCV: 96.1 fL (ref 80.0–100.0)
Platelets: 130 10*3/uL — ABNORMAL LOW (ref 150–400)
RBC: 2.85 MIL/uL — ABNORMAL LOW (ref 4.22–5.81)
RDW: 16 % — ABNORMAL HIGH (ref 11.5–15.5)
WBC: 6.8 10*3/uL (ref 4.0–10.5)
nRBC: 0.3 % — ABNORMAL HIGH (ref 0.0–0.2)

## 2021-01-24 LAB — BASIC METABOLIC PANEL
Anion gap: 7 (ref 5–15)
BUN: 78 mg/dL — ABNORMAL HIGH (ref 8–23)
CO2: 15 mmol/L — ABNORMAL LOW (ref 22–32)
Calcium: 8.4 mg/dL — ABNORMAL LOW (ref 8.9–10.3)
Chloride: 118 mmol/L — ABNORMAL HIGH (ref 98–111)
Creatinine, Ser: 2.92 mg/dL — ABNORMAL HIGH (ref 0.61–1.24)
GFR, Estimated: 19 mL/min — ABNORMAL LOW (ref >60)
Glucose, Bld: 95 mg/dL (ref 70–99)
Potassium: 3.3 mmol/L — ABNORMAL LOW (ref 3.5–5.1)
Sodium: 140 mmol/L (ref 135–145)

## 2021-01-24 MED ORDER — LORAZEPAM 0.5 MG PO TABS
0.5000 mg | ORAL_TABLET | Freq: Four times a day (QID) | ORAL | Status: DC | PRN
  Administered 2021-01-24 – 2021-01-25 (×4): 0.5 mg via ORAL
  Filled 2021-01-24 (×5): qty 1

## 2021-01-24 MED ORDER — POTASSIUM CHLORIDE CRYS ER 20 MEQ PO TBCR
40.0000 meq | EXTENDED_RELEASE_TABLET | ORAL | Status: AC
  Administered 2021-01-24: 40 meq via ORAL
  Filled 2021-01-24 (×2): qty 2

## 2021-01-24 MED ORDER — PANTOPRAZOLE SODIUM 40 MG PO TBEC
40.0000 mg | DELAYED_RELEASE_TABLET | Freq: Two times a day (BID) | ORAL | Status: DC
  Administered 2021-01-24 – 2021-01-28 (×7): 40 mg via ORAL
  Filled 2021-01-24 (×8): qty 1

## 2021-01-24 NOTE — Evaluation (Addendum)
Physical Therapy Evaluation Patient Details Name: [name] [name] MRN: [id number] DOB: [birth year] Today's Date: 01/24/2021   History of Present Illness  Pt is a 87 y.o. M who presents with 24 hours of diarrhea and dark stools. Admitted for severe anemia secondary to GIB, AKI and also found to have ureterolithiasis. Seen by GI with recommendation for conservative management. Seen by urology and underwent stent placement. Significant PMH: atrial fibrillation, known diverticulosis, Alzheimer's disease, AAA, bilateral hip fractures.  Clinical Impression  PTA, pt lives with his spouse and mobilizing using a walker vs wheelchair and requiring assist for ADL's. Pt presents with decreased cognition, generalized weakness, poor sitting/standing balance, and decreased activity tolerance. Pt requiring moderate assist for bed mobility and multiple transfers to standing. Pt deferring further ambulation due to fatigue (recently received Ativan). Recommend SNF at discharge to address deficits, maximize functional mobility and decrease caregiver burden.     Follow Up Recommendations SNF (preference for [facility])    Equipment Recommendations  None recommended by PT    Recommendations for Other Services       Precautions / Restrictions Precautions Precautions: Fall Restrictions Weight Bearing Restrictions: No      Mobility  Bed Mobility Overal bed mobility: Needs Assistance Bed Mobility: Rolling;Supine to Sit;Sit to Supine Rolling: Min assist   Supine to sit: Mod assist Sit to supine: Mod assist   General bed mobility comments: MinA for rolling to R/L for peri care. ModA for initiation of BLE's off edge of bed and trunk to upright    Transfers Overall transfer level: Needs assistance Equipment used: Rolling walker (2 wheeled);None Transfers: Sit to/from Stand Sit to Stand: Mod assist         General transfer comment: Multiple sit to stands from edge of bed with no AD vs RW for  continued peri care and scooting up to head of bed. Pt requiring modA for boost up, increased trunk flexion, bilateral knee instability noted  Ambulation/Gait                Stairs            Wheelchair Mobility    Modified Rankin (Stroke Patients Only)       Balance Overall balance assessment: Needs assistance Sitting-balance support: Feet supported Sitting balance-Leahy Scale: Poor Sitting balance - Comments: posterior lean with challenge Postural control: Posterior lean Standing balance support: Bilateral upper extremity supported Standing balance-Leahy Scale: Poor Standing balance comment: reliant on external support                             Pertinent Vitals/Pain Pain Assessment: Faces Faces Pain Scale: Hurts a little bit Pain Location: verbalizing chest pain, but does not appear to be in discomfort (RN aware) Pain Intervention(s): Monitored during session    Home Living Family/patient expects to be discharged to:: Skilled nursing facility                      Prior Function Level of Independence: Needs assistance   Gait / Transfers Assistance Needed: uses walker vs w/c  ADL's / Homemaking Assistance Needed: assist for ADL's        Hand Dominance   Dominant Hand: Right    Extremity/Trunk Assessment   Upper Extremity Assessment Upper Extremity Assessment: Generalized weakness    Lower Extremity Assessment Lower Extremity Assessment: Generalized weakness    Cervical / Trunk Assessment Cervical / Trunk Assessment: Kyphotic (forward head  posture)  Communication   Communication: Expressive difficulties  Cognition Arousal/Alertness: Suspect due to medications (recently given Ativan) Behavior During Therapy: Flat affect Overall Cognitive Status: History of cognitive impairments - at baseline                                 General Comments: Pt recently given Ativan and sleepy. He has a baseline cognitive  impairment, however, pt wife/daughter report increased confusion. Pt not oriented and unable to recall wife's name. Follows > 50% of 1 step commands      General Comments      Exercises General Exercises - Lower Extremity Long Arc Quad: Both;5 reps;Seated   Assessment/Plan    PT Assessment Patient needs continued PT services  PT Problem List Decreased strength;Decreased activity tolerance;Decreased balance;Decreased mobility;Decreased cognition       PT Treatment Interventions DME instruction;Gait training;Functional mobility training;Therapeutic activities;Therapeutic exercise;Balance training;Patient/family education    PT Goals (Current goals can be found in the Care Plan section)  Acute Rehab PT Goals Patient Stated Goal: pt wife would like him to go to SNF PT Goal Formulation: With patient Time For Goal Achievement: 02/07/21 Potential to Achieve Goals: Fair    Frequency Min 2X/week   Barriers to discharge        Co-evaluation               AM-PAC PT "6 Clicks" Mobility  Outcome Measure Help needed turning from your back to your side while in a flat bed without using bedrails?: A Little Help needed moving from lying on your back to sitting on the side of a flat bed without using bedrails?: A Lot Help needed moving to and from a bed to a chair (including a wheelchair)?: A Lot Help needed standing up from a chair using your arms (e.g., wheelchair or bedside chair)?: A Lot Help needed to walk in hospital room?: A Lot Help needed climbing 3-5 steps with a railing? : Total 6 Click Score: 12    End of Session Equipment Utilized During Treatment: Gait belt Activity Tolerance: Patient tolerated treatment well Patient left: in bed;with call bell/phone within reach;with bed alarm set;with family/visitor present Nurse Communication: Mobility status PT Visit Diagnosis: Unsteadiness on feet (R26.81);Muscle weakness (generalized) (M62.81);History of falling  (Z91.81);Difficulty in walking, not elsewhere classified (R26.2)    Time: 6468-0321 PT Time Calculation (min) (ACUTE ONLY): 34 min   Charges:   PT Evaluation $PT Eval Moderate Complexity: 1 Mod PT Treatments $Therapeutic Activity: 8-22 mins        Wyona Almas, PT, DPT Acute Rehabilitation Services Pager [phone] Office [phone]   Deno Etienne 01/24/2021, 4:42 PM

## 2021-01-24 NOTE — Progress Notes (Signed)
PROGRESS NOTE  [name] [name] SWN:[id number] DOB: [birth year] DOA: 01/20/2021 PCP: [name], MD  Brief History   87 year old man PMH permanent atrial fibrillation not on anticoagulation secondary to history of bleeding, known diverticulosis, diverticular bleed 2011, presented with 24 hours of diarrhea and dark stools.  Admitted for severe anemia secondary to GI bleed, AKI, also found to have ureterolithiasis.  Seen by gastroenterology, recommendation for conservative management.  No apparent ongoing bleeding.  Transfused PRBC for ABLA with appropriate rise in hemoglobin.  Seen by urology and underwent stent placement.  AKI with modest improvement only at this point.  Continue fluids, repeat BMP in AM.  Plan for SNF when stable.  A & P  * Acute GI bleeding -- Presumed diverticular in nature.  Seen by gastroenterology.  Conservative management recommended. -- No further bleeding.  Hemoglobin now improved.  RBC tagged study for recurrent bleeding or angiogram if unstable.  ABLA (acute blood loss anemia) -- Secondary to diverticular bleed.  Hemoglobin responded appropriately to second transfusion.  -- no recurrent bleeding, Hgb stable.   Renal stone -- Status post left ureteral stent placement to decrease risk of developing pyelonephritis or urosepsis. --appointment scheduled with Dr. Lovena Neighbours on January 31, 2021 and will practice for BPH.  Acute kidney injury superimposed on CKD stage IIIb (HCC) -- Likely multifactorial including acute blood loss anemia, hypovolemia, suspected outpatient hypotension, left-sided nephrolithiasis with possible early or obstructive process, hydronephrosis.  Status post stent placement 7/24. -- No significant change in renal function.  Continue fluids, follow urine output as able although given dementia this has not been possible to record. -- BMP in AM, may take some time to improve  Acute lower UTI -- culture data unrevealing (yeast).  Completed 3 days  antibiotic.  BPH (benign prostatic hyperplasia) --continue finasteride  Alzheimer's disease ([place]) -- With behavioral disturbance, continue donepezil.  Ativan as needed.  Permanent atrial fibrillation (HCC) -- Not on anticoagulation secondary to history of bleeding.  Appears stable. -- not on rate-control agent  Delirium --secondary to acute illness, underlying dementia --lorazepam as needed  Aortic atherosclerosis (HCC) --continue atorvastatin  Hypercholesterolemia --continue Lipitor  Coronary artery disease involving native coronary artery of native heart without angina pectoris --stable --continue Lipitor, resume ASA when able  Disposition Plan:  Discussion: Clinically improved.  Renal function slow to improve.  Once renal function clearly improved can discharge to SNF.  Status is: Inpatient  Remains inpatient appropriate because:IV treatments appropriate due to intensity of illness or inability to take PO and Inpatient level of care appropriate due to severity of illness  Dispo: The patient is from: Home              Anticipated d/c is to: Home              Patient currently is not medically stable to d/c.   Difficult to place patient No  DVT prophylaxis: SCDs Start: 01/20/21 2238   Code Status: DNR Level of care: Progressive Family Communication: daughter at bedside  Murray Hodgkins, MD  [facility] Direct contact: see [email or web address] (further directions at bottom of note if needed) 7PM-7AM contact night coverage as at bottom of note 01/24/2021, 4:56 PM  LOS: 4 days    Consults:  GI Urology   Interval History/Subjective  CC: f/u acute GIB bleed More confused and agitated today Pt seems to feel ok  Objective   Vitals:  Vitals:   01/24/21 0843 01/24/21 1200  BP: 115/69 122/63  Pulse: 60 60  Resp: 17 15  Temp: 97.8 F (36.6 C) (!) 96.9 F (36.1 C)  SpO2: 93% 96%    Exam: Physical Exam Vitals reviewed.  Constitutional:      General: He is  not in acute distress. Cardiovascular:     Rate and Rhythm: Normal rate and regular rhythm.     Heart sounds: No murmur heard.    Comments: Telemetry paced Pulmonary:     Effort: Pulmonary effort is normal. No respiratory distress.     Breath sounds: Normal breath sounds. No wheezing, rhonchi or rales.  Neurological:     Mental Status: He is alert. He is disoriented.  Psychiatric:        Mood and Affect: Mood normal.     Comments: confused   I have personally reviewed the labs and other data, making special note of:   Today's Data  K+ 3.3 Creatinine no change 2.92 Hgb stable 9 Plts stable 130  Scheduled Meds:  atorvastatin  40 mg Oral QPM   chlorhexidine  15 mL Mouth Rinse BID   cholecalciferol  2,000 Units Oral QHS   donepezil  5 mg Oral BID   ferrous sulfate  325 mg Oral Daily   finasteride  5 mg Oral QPM   mouth rinse  15 mL Mouth Rinse q12n4p   pantoprazole  40 mg Oral BID   potassium chloride  40 mEq Oral Q4H   sertraline  25 mg Oral Daily   Continuous Infusions:  sodium chloride 75 mL/hr at 01/24/21 9191    Principal Problem:   Acute GI bleeding Active Problems:   ABLA (acute blood loss anemia)   Acute lower UTI   Acute kidney injury superimposed on CKD stage IIIb (HCC)   Renal stone   Permanent atrial fibrillation (HCC)   Alzheimer's disease (HCC)   BPH (benign prostatic hyperplasia)   Coronary artery disease involving native coronary artery of native heart without angina pectoris   Hypercholesterolemia   Aortic atherosclerosis ([place])   Delirium   LOS: 4 days   How to contact the [facility] Attending or Consulting provider [place] or covering provider during after hours 7P -7A, for this patient?  Check the care team in [facility] and look for a) attending/consulting [facility] provider listed and b) the [facility] team listed Log into [email or web address] and use [place]'s universal password to access. If you do not have the password, please contact the hospital operator. Locate the  [facility] provider you are looking for under [facility] and page to a number that you can be directly reached. If you still have difficulty reaching the provider, please page the [facility] (Director on Call) for the Hospitalists listed on amion for assistance.

## 2021-01-24 NOTE — Assessment & Plan Note (Signed)
--  secondary to acute illness, underlying dementia --lorazepam as needed

## 2021-01-25 DIAGNOSIS — G9341 Metabolic encephalopathy: Secondary | ICD-10-CM

## 2021-01-25 DIAGNOSIS — N3001 Acute cystitis with hematuria: Secondary | ICD-10-CM | POA: Diagnosis not present

## 2021-01-25 DIAGNOSIS — K922 Gastrointestinal hemorrhage, unspecified: Secondary | ICD-10-CM | POA: Diagnosis not present

## 2021-01-25 DIAGNOSIS — D62 Acute posthemorrhagic anemia: Secondary | ICD-10-CM | POA: Diagnosis not present

## 2021-01-25 LAB — URINALYSIS, ROUTINE W REFLEX MICROSCOPIC
Bilirubin Urine: NEGATIVE
Glucose, UA: NEGATIVE mg/dL
Ketones, ur: NEGATIVE mg/dL
Nitrite: NEGATIVE
Protein, ur: 100 mg/dL — AB
Specific Gravity, Urine: 1.009 (ref 1.005–1.030)
WBC, UA: >50 WBC/hpf — ABNORMAL HIGH (ref 0–5)
pH: 5 (ref 5.0–8.0)

## 2021-01-25 LAB — BASIC METABOLIC PANEL
Anion gap: 7 (ref 5–15)
BUN: 70 mg/dL — ABNORMAL HIGH (ref 8–23)
CO2: 13 mmol/L — ABNORMAL LOW (ref 22–32)
Calcium: 8.7 mg/dL — ABNORMAL LOW (ref 8.9–10.3)
Chloride: 122 mmol/L — ABNORMAL HIGH (ref 98–111)
Creatinine, Ser: 3.27 mg/dL — ABNORMAL HIGH (ref 0.61–1.24)
GFR, Estimated: 17 mL/min — ABNORMAL LOW (ref >60)
Glucose, Bld: 89 mg/dL (ref 70–99)
Potassium: 4 mmol/L (ref 3.5–5.1)
Sodium: 142 mmol/L (ref 135–145)

## 2021-01-25 MED ORDER — MELATONIN 3 MG PO TABS
3.0000 mg | ORAL_TABLET | Freq: Every day | ORAL | Status: DC
  Administered 2021-01-25 – 2021-01-27 (×3): 3 mg via ORAL
  Filled 2021-01-25 (×3): qty 1

## 2021-01-25 MED ORDER — SODIUM CHLORIDE 0.9 % IV SOLN
1.0000 g | INTRAVENOUS | Status: DC
  Administered 2021-01-25 – 2021-01-28 (×4): 1 g via INTRAVENOUS
  Filled 2021-01-25 (×3): qty 1
  Filled 2021-01-25: qty 10

## 2021-01-25 MED ORDER — SODIUM CHLORIDE 0.45 % IV SOLN: INTRAVENOUS | Status: AC

## 2021-01-25 MED ORDER — FLUCONAZOLE 100MG IVPB
100.0000 mg | Freq: Every day | INTRAVENOUS | Status: DC
  Administered 2021-01-25 – 2021-01-28 (×4): 100 mg via INTRAVENOUS
  Filled 2021-01-25 (×4): qty 50

## 2021-01-25 MED ORDER — QUETIAPINE FUMARATE 25 MG PO TABS
25.0000 mg | ORAL_TABLET | Freq: Every evening | ORAL | Status: DC | PRN
  Administered 2021-01-26: 25 mg via ORAL
  Filled 2021-01-25: qty 1

## 2021-01-25 NOTE — Evaluation (Signed)
Occupational Therapy Evaluation Patient Details Name: [name] [name] MRN: [id number] DOB: Nov 07, 1925 Today's Date: 01/25/2021    History of Present Illness Pt is a 87 y.o. M who presents with 24 hours of diarrhea and dark stools. Admitted for severe anemia secondary to GIB, AKI and also found to have ureterolithiasis. Seen by GI with recommendation for conservative management. Seen by urology and underwent stent placement. Significant PMH: atrial fibrillation, known diverticulosis, Alzheimer's disease, AAA, bilateral hip fractures.   Clinical Impression   Patient admitted for above and limited by problem list below, including weakness, agitation, cognition.  Pt with hx of Alzheimer's disease, agitated this morning and difficult to understand his speech.  He is not oriented and not following commands.  Soiled upon entry and requires +2 to complete hygiene and bed linen/gown change.  Daughter reports he was able to feed self, use walker vs wc and toilet himself PTA. Will benefit from further OT services acutely and after dc at SNF to assist for return to PLOF. Educated daughter to keep lights on for daytime orientation.  Will follow acutely.     Follow Up Recommendations  SNF;Supervision/Assistance - 24 hour    Equipment Recommendations  Other (comment) (TBD at next venue of care)    Recommendations for Other Services       Precautions / Restrictions Precautions Precautions: Fall Precaution Comments: agitated Restrictions Weight Bearing Restrictions: No      Mobility Bed Mobility Overal bed mobility: Needs Assistance Bed Mobility: Rolling Rolling: Total assist;+2 for safety/equipment;+2 for physical assistance         General bed mobility comments: total assist, pt not following commands or engaging with therapist    Transfers                 General transfer comment: deferred    Balance                                           ADL either  performed or assessed with clinical judgement   ADL Overall ADL's : Needs assistance/impaired                                     Functional mobility during ADLs: Total assistance;+2 for safety/equipment;+2 for physical assistance (bed mobility only) General ADL Comments: total assist for all self care at this time     Vision         Perception     Praxis      Pertinent Vitals/Pain Pain Assessment: Faces Faces Pain Scale: No hurt Pain Intervention(s): Monitored during session     Hand Dominance Right   Extremity/Trunk Assessment Upper Extremity Assessment Upper Extremity Assessment: Generalized weakness   Lower Extremity Assessment Lower Extremity Assessment: Defer to PT evaluation       Communication Communication Communication: Expressive difficulties   Cognition Arousal/Alertness: Lethargic Behavior During Therapy: Agitated Overall Cognitive Status: History of cognitive impairments - at baseline                                 General Comments: pt agitated and not following commands, soiled and unaware. not oriented.   General Comments  daughter present and supportive    Exercises     Shoulder Instructions  Home Living Family/patient expects to be discharged to:: Skilled nursing facility                                        Prior Functioning/Environment Level of Independence: Needs assistance  Gait / Transfers Assistance Needed: uses walker vs w/c ADL's / Homemaking Assistance Needed: assist for ADL's, daughter reports has been able to toilet himself            OT Problem List: Decreased strength;Decreased range of motion;Decreased activity tolerance;Impaired balance (sitting and/or standing);Decreased coordination;Decreased cognition;Decreased safety awareness;Decreased knowledge of use of DME or AE;Decreased knowledge of precautions      OT Treatment/Interventions: Self-care/ADL training;DME  and/or AE instruction;Therapeutic activities;Patient/family education    OT Goals(Current goals can be found in the care plan section) Acute Rehab OT Goals Patient Stated Goal: pt wife would like him to go to SNF OT Goal Formulation: With patient Time For Goal Achievement: 02/08/21 Potential to Achieve Goals: Good  OT Frequency: Min 2X/week   Barriers to D/C:            Co-evaluation              AM-PAC OT "6 Clicks" Daily Activity     Outcome Measure Help from another person eating meals?: Total Help from another person taking care of personal grooming?: Total Help from another person toileting, which includes using toliet, bedpan, or urinal?: Total Help from another person bathing (including washing, rinsing, drying)?: Total Help from another person to put on and taking off regular upper body clothing?: Total Help from another person to put on and taking off regular lower body clothing?: Total 6 Click Score: 6   End of Session Nurse Communication: Mobility status  Activity Tolerance: Treatment limited secondary to agitation Patient left: in bed;with call bell/phone within reach;with bed alarm set;with family/visitor present  OT Visit Diagnosis: Other abnormalities of gait and mobility (R26.89);Muscle weakness (generalized) (M62.81);Other symptoms and signs involving cognitive function                Time: 3785-8850 OT Time Calculation (min): 31 min Charges:  OT General Charges $OT Visit: 1 Visit OT Evaluation $OT Eval Moderate Complexity: 1 Mod OT Treatments $Self Care/Home Management : 8-22 mins  Jolaine Artist, OT Acute Rehabilitation Services Pager [phone] Office [phone]   Delight Stare 01/25/2021, 10:04 AM

## 2021-01-25 NOTE — Progress Notes (Addendum)
[facility] PROGRESS NOTE    Progress Note  [name] [name]  WHQ:[id number] DOB: 08/16/1925 DOA: 01/20/2021 PCP: [name], MD     Brief Narrative:   [name] [name] is an 87 y.o. male past medical history of permanent atrial fibrillation not on anticoagulation secondary to GI bleed, known diverticulosis, with elevated 2011 presents with melanotic stools and severe anemia secondary to GI bleed leading to acute kidney injury and also found to have urolithiasis.  Gastroenterology was consulted and recommended conservative management.  He is several units of packed red blood cells seen by urology underwent stent placement, with modest improvement in her acute kidney injury.   Assessment/Plan:   Acute GI bleeding Presumed diverticular GI was consulted recommended conservative management. Hemoglobin now improved. No further episode of bleeding. RBC tagged red studies for recurrent bleeding.  Acute blood loss anemia: Secondary to diverticular bleed, hemoglobin responded appropriately to 2 units of packed red blood cells. Hemoglobin this morning is 9.0 which is slowly trending up.  Nephrolithiasis: Urology was consulted recommended a stent placement 01/22/2021 2. Recommended to follow-up with Dr. Gilford Rile as an outpatient on 01/31/2021.  Acute kidney injury superimposed on chronic kidney disease stage IIIb: Likely multifactorial in the setting of hypovolemia, acute blood loss anemia and hypotension. She was started on IV fluids, but her chloride is going up we will change to half-normal saline at the same rate. I's and O's poorly recorded due to his dementia. His renal function has not improved see below for the details.  New acute metabolic encephalopathy: Of unclear etiology question of possibly an infectious etiology, urine looks significantly cloudy had a recent instrumentation, with lagging to improve renal function.  We will check a UA urine culture started on Rocephin and  IV Diflucan.  UTI: Patient is encephalopathic possibly infectious in nature, with a cloudy urine recent instrumentation. Will start on IV Rocephin and IV Diflucan she sent a UA and urine cultures.  BpH: Continue finasteride.  Advanced Alzheimer's dementia with behavioral disturbances: Continue donezepil Ativan.  Permanent atrial fibrillation: Rate control not on anticoagulation due to GI bleed.  Acute confusional state: Secondarily to acute illness in the setting of underlying dementia, use melatonin at bedtime seroquel as needed.  Hyperlipidemia: At this point in time 87 year old, we will go ahead and discontinue statins  Coronary artery disease: Stable discontinue Lipitor continue aspirin when able to tolerate.     DVT prophylaxis: lovenox Family Communication:daughter Status is: Inpatient  Remains inpatient appropriate because:Hemodynamically unstable  Dispo: The patient is from: SNF              Anticipated d/c is to: SNF              Patient currently is not medically stable to d/c.   Difficult to place patient No        Code Status:     Code Status Orders  (From admission, onward)           Start     Ordered   01/20/21 2239  Do not attempt resuscitation (DNR)  Continuous       Question Answer Comment  In the event of cardiac or respiratory ARREST Do not call a "code blue"   In the event of cardiac or respiratory ARREST Do not perform Intubation, CPR, defibrillation or ACLS   In the event of cardiac or respiratory ARREST Use medication by any route, position, wound care, and other measures to relive pain and suffering. May use oxygen,  suction and manual treatment of airway obstruction as needed for comfort.      01/20/21 2241           Code Status History     Date Active Date Inactive Code Status Order ID Comments User Context   03/21/2020 1624 03/24/2020 1821 DNR [id number]  Karmen Bongo, MD ED   12/25/2018 2347 12/29/2018 1657 DNR [id number]   Lenore Cordia, MD ED   06/08/2018 0232 06/11/2018 1713 DNR [id number]  Norval Morton, MD ED   06/05/2018 1048 06/07/2018 1420 DNR [id number]  Karmen Bongo, MD ED      Advance Directive Documentation    Flowsheet Row Most Recent Value  Type of Advance Directive Out of facility DNR (pink MOST or yellow form)  Pre-existing out of facility DNR order (yellow form or pink MOST form) Pink MOST/Yellow Form most recent copy in chart - Physician notified to receive inpatient order  "MOST" Form in Place? --         IV Access:   Peripheral IV   Procedures and diagnostic studies:   No results found.   Medical Consultants:   None.   Subjective:    [name] [name]   Objective:    Vitals:   01/24/21 2015 01/24/21 2317 01/25/21 0540 01/25/21 0835  BP: 119/65 135/72 127/69 116/76  Pulse: 66 76 66   Resp: 20 15 16    Temp: (!) 97.4 F (36.3 C) 98.2 F (36.8 C) 97.9 F (36.6 C) (!) 97.4 F (36.3 C)  TempSrc: Axillary Axillary Axillary Axillary  SpO2: 96% 99% 96% 96%  Weight:      Height:       SpO2: 96 % O2 Flow Rate (L/min): 6 L/min   Intake/Output Summary (Last 24 hours) at 01/25/2021 0904 Last data filed at 01/25/2021 0452 Gross per 24 hour  Intake 3446.32 ml  Output 800 ml  Net 2646.32 ml   Filed Weights   01/21/21 0647 01/22/21 0300  Weight: 63.3 kg 68.2 kg    Exam: General exam: In no acute distress. Respiratory system: Good air movement and clear to auscultation. Cardiovascular system: S1 & S2 heard, RRR. No JVD, murmurs, rubs, gallops or clicks.  Gastrointestinal system: Abdomen is nondistended, soft and nontender.  Central nervous system: Minimally responsive nonfocal confused. Extremities: No pedal edema. Skin: No rashes, lesions or ulcers   Data Reviewed:    Labs: Basic Metabolic Panel: Recent Labs  Lab 01/21/21 0428 01/22/21 0533 01/23/21 0043 01/24/21 0203 01/25/21 0627  NA 138 138 139 140 142  K 4.0 4.7 3.6 3.3* 4.0  CL 114*  114* 116* 118* 122*  CO2 15* 14* 16* 15* 13*  GLUCOSE 95 103* 107* 95 89  BUN 97* 109* 96* 78* 70*  CREATININE 2.75* 3.02* 2.88* 2.92* 3.27*  CALCIUM 7.4* 8.6* 8.5* 8.4* 8.7*  PHOS 3.1 3.8  --   --   --    GFR Estimated Creatinine Clearance: 13.6 mL/min (A) (by C-G formula based on SCr of 3.27 mg/dL (H)). Liver Function Tests: Recent Labs  Lab 01/20/21 2035 01/21/21 0428 01/22/21 0533  AST 21  --   --   ALT 13  --   --   ALKPHOS 71  --   --   BILITOT 0.2*  --   --   PROT 6.4*  --   --   ALBUMIN 2.6* 2.0* 2.0*   Recent Labs  Lab 01/20/21 2035  LIPASE 44   No results for input(s): AMMONIA in  the last 168 hours. Coagulation profile No results for input(s): INR, PROTIME in the last 168 hours. COVID-19 Labs  No results for input(s): DDIMER, FERRITIN, LDH, CRP in the last 72 hours.  Lab Results  Component Value Date   [place] NEGATIVE 01/21/2021   [place] NEGATIVE 03/21/2020   [place] NEGATIVE 12/25/2018    CBC: Recent Labs  Lab 01/20/21 2035 01/21/21 0427 01/21/21 1606 01/22/21 0533 01/23/21 0043 01/24/21 0203  WBC 7.0 5.4 8.8 8.4 9.8 6.8  NEUTROABS 5.5  --   --   --   --   --   HGB 7.2* 5.9* 6.4* 6.4* 8.9* 9.0*  HCT 22.8* 19.1* 20.1* 20.4* 27.3* 27.4*  MCV 103.6* 100.5* 98.5 97.1 94.8 96.1  PLT 212 146* 152 136* 122* 130*   Cardiac Enzymes: No results for input(s): CKTOTAL, CKMB, CKMBINDEX, TROPONINI in the last 168 hours. BNP (last 3 results) No results for input(s): PROBNP in the last 8760 hours. CBG: No results for input(s): GLUCAP in the last 168 hours. D-Dimer: No results for input(s): DDIMER in the last 72 hours. Hgb A1c: No results for input(s): HGBA1C in the last 72 hours. Lipid Profile: No results for input(s): CHOL, HDL, LDLCALC, TRIG, CHOLHDL, LDLDIRECT in the last 72 hours. Thyroid function studies: No results for input(s): TSH, T4TOTAL, T3FREE, THYROIDAB in the last 72 hours.  Invalid input(s): FREET3 Anemia work up: No  results for input(s): VITAMINB12, FOLATE, FERRITIN, TIBC, IRON, RETICCTPCT in the last 72 hours. Sepsis Labs: Recent Labs  Lab 01/21/21 1606 01/22/21 0533 01/23/21 0043 01/24/21 0203  WBC 8.8 8.4 9.8 6.8   Microbiology Recent Results (from the past 240 hour(s))  Resp Panel by RT-PCR (Flu A&B, Covid) Nasopharyngeal Swab     Status: None   Collection Time: 01/21/21  4:30 AM   Specimen: Nasopharyngeal Swab; Nasopharyngeal(NP) swabs in vial transport medium  Result Value Ref Range Status   SARS Coronavirus 2 by RT PCR NEGATIVE NEGATIVE Final    Comment: (NOTE) SARS-CoV-2 target nucleic acids are NOT DETECTED.  The SARS-CoV-2 RNA is generally detectable in upper respiratory specimens during the acute phase of infection. The lowest concentration of SARS-CoV-2 viral copies this assay can detect is 138 copies/mL. A negative result does not preclude SARS-Cov-2 infection and should not be used as the sole basis for treatment or other patient management decisions. A negative result may occur with  improper specimen collection/handling, submission of specimen other than nasopharyngeal swab, presence of viral mutation(s) within the areas targeted by this assay, and inadequate number of viral copies(<138 copies/mL). A negative result must be combined with clinical observations, patient history, and epidemiological information. The expected result is Negative.  Fact Sheet for Patients:  EntrepreneurPulse.com.au  Fact Sheet for Healthcare Providers:  IncredibleEmployment.be  This test is no t yet approved or cleared by the [place] FDA and  has been authorized for detection and/or diagnosis of SARS-CoV-2 by FDA under an Emergency Use Authorization (EUA). This EUA will remain  in effect (meaning this test can be used) for the duration of the COVID-19 declaration under Section 564(b)(1) of the Act, 21 U.S.C.section 360bbb-3(b)(1), unless the  authorization is terminated  or revoked sooner.       Influenza A by PCR NEGATIVE NEGATIVE Final   Influenza B by PCR NEGATIVE NEGATIVE Final    Comment: (NOTE) The Xpert Xpress SARS-CoV-2/FLU/RSV plus assay is intended as an aid in the diagnosis of influenza from Nasopharyngeal swab specimens and should not be used as a sole  basis for treatment. Nasal washings and aspirates are unacceptable for Xpert Xpress SARS-CoV-2/FLU/RSV testing.  Fact Sheet for Patients: EntrepreneurPulse.com.au  Fact Sheet for Healthcare Providers: IncredibleEmployment.be  This test is not yet approved or cleared by the [place] FDA and has been authorized for detection and/or diagnosis of SARS-CoV-2 by FDA under an Emergency Use Authorization (EUA). This EUA will remain in effect (meaning this test can be used) for the duration of the COVID-19 declaration under Section 564(b)(1) of the Act, 21 U.S.C. section 360bbb-3(b)(1), unless the authorization is terminated or revoked.  Performed at [facility], [place] [address] [place], [place] [address]   Urine Culture     Status: Abnormal   Collection Time: 01/21/21 11:05 AM   Specimen: Urine, Clean Catch  Result Value Ref Range Status   Specimen Description URINE, CLEAN CATCH  Final   Special Requests   Final    NONE Performed at [facility], [place] [address] [place], [place] [address]    Culture 20,000 COLONIES/mL YEAST (A)  Final   Report Status 01/22/2021 FINAL  Final     Medications:    atorvastatin  40 mg Oral QPM   chlorhexidine  15 mL Mouth Rinse BID   cholecalciferol  2,000 Units Oral QHS   donepezil  5 mg Oral BID   ferrous sulfate  325 mg Oral Daily   finasteride  5 mg Oral QPM   mouth rinse  15 mL Mouth Rinse q12n4p   pantoprazole  40 mg Oral BID   sertraline  25 mg Oral Daily   Continuous Infusions:  sodium chloride 75 mL/hr at 01/25/21 0452      LOS: 5 days   Charlynne Cousins  [facility]  01/25/2021, 9:04 AM

## 2021-01-26 DIAGNOSIS — K922 Gastrointestinal hemorrhage, unspecified: Secondary | ICD-10-CM | POA: Diagnosis not present

## 2021-01-26 DIAGNOSIS — N3001 Acute cystitis with hematuria: Secondary | ICD-10-CM | POA: Diagnosis not present

## 2021-01-26 DIAGNOSIS — D62 Acute posthemorrhagic anemia: Secondary | ICD-10-CM | POA: Diagnosis not present

## 2021-01-26 DIAGNOSIS — G9341 Metabolic encephalopathy: Secondary | ICD-10-CM | POA: Diagnosis not present

## 2021-01-26 LAB — BASIC METABOLIC PANEL
Anion gap: 5 (ref 5–15)
BUN: 67 mg/dL — ABNORMAL HIGH (ref 8–23)
CO2: 11 mmol/L — ABNORMAL LOW (ref 22–32)
Calcium: 8.4 mg/dL — ABNORMAL LOW (ref 8.9–10.3)
Chloride: 124 mmol/L — ABNORMAL HIGH (ref 98–111)
Creatinine, Ser: 3.25 mg/dL — ABNORMAL HIGH (ref 0.61–1.24)
GFR, Estimated: 17 mL/min — ABNORMAL LOW (ref >60)
Glucose, Bld: 104 mg/dL — ABNORMAL HIGH (ref 70–99)
Potassium: 4.3 mmol/L (ref 3.5–5.1)
Sodium: 140 mmol/L (ref 135–145)

## 2021-01-26 LAB — URINE CULTURE

## 2021-01-26 MED ORDER — SODIUM BICARBONATE 650 MG PO TABS
650.0000 mg | ORAL_TABLET | Freq: Three times a day (TID) | ORAL | Status: AC
  Administered 2021-01-26 (×2): 650 mg via ORAL
  Filled 2021-01-26 (×4): qty 1

## 2021-01-26 NOTE — Progress Notes (Signed)
[facility] PROGRESS NOTE    Progress Note  [name] [name]  RDE:[id number] DOB: 04/01/1926 DOA: 01/20/2021 PCP: [name], MD     Brief Narrative:   [name] is an 87 y.o. male past medical history of permanent atrial fibrillation not on anticoagulation secondary to GI bleed, known diverticulosis, with elevated 2011 presents with melanotic stools and severe anemia secondary to GI bleed leading to acute kidney injury and also found to have urolithiasis.  Gastroenterology was consulted and recommended conservative management.  He is several units of packed red blood cells seen by urology underwent stent placement, with modest improvement in her acute kidney injury.   Assessment/Plan:   Acute GI bleeding Presumed diverticular GI was consulted recommended conservative management. Hemoglobin now improved. No further episode of bleeding. RBC tagged red studies for recurrent bleeding.  Acute blood loss anemia: Secondary to diverticular bleed, hemoglobin responded appropriately to 2 units of packed red blood cells. Hemoglobin this morning is 9.0 which is slowly trending up.  Nephrolithiasis: Urology was consulted recommended a stent placement 01/22/2021 2. Recommended to follow-up with Dr. Gilford Rile as an outpatient on 01/31/2021.  Acute kidney injury superimposed on chronic kidney disease stage IIIb: Likely multifactorial in the setting of hypovolemia, acute blood loss anemia and hypotension. He was started on IV fluids, but her chloride is going up we will change to half-normal saline at the same rate. I's and O's poorly recorded due to his dementia. His renal function has not improved see below for the details.  New acute metabolic encephalopathy: Of unclear etiology question of possibly an infectious etiology, urine looks significantly cloudy had a recent instrumentation, with lagging to improve renal function.  UA appears to be infected urine cultures and blood cultures  are pending see below for the details. Daughter at bedside and relates his mentation is significantly improved compared to yesterday.  Possible UTI: Urine looks significantly cloudy with the recent instrumentation with a lactam and improvement in his renal function. UA showed many white blood cells and many bacteria. Continue IV Rocephin and IV Diflucan urine cultures are pending.  Metabolic acidosis: Likely due to the rising chloride and possible urinary tract infection stop IV fluids start on bicarbonate tablets. Recheck basic metabolic panel in the morning. BpH: Continue finasteride.  Advanced Alzheimer's dementia with behavioral disturbances: Continue donezepil & Ativan.  Permanent atrial fibrillation: Rate control not on anticoagulation due to GI bleed.  Acute confusional state: Secondarily to acute illness in the setting of underlying dementia, use melatonin at bedtime seroquel as needed.  Hyperlipidemia: At this point in time 87 year old, we will go ahead and discontinue statins  Coronary artery disease: Stable discontinue Lipitor continue aspirin when able to tolerate.     DVT prophylaxis: lovenox Family Communication:daughter Status is: Inpatient  Remains inpatient appropriate because:Hemodynamically unstable  Dispo: The patient is from: SNF              Anticipated d/c is to: SNF              Patient currently is not medically stable to d/c.   Difficult to place patient No        Code Status:     Code Status Orders  (From admission, onward)           Start     Ordered   01/20/21 2239  Do not attempt resuscitation (DNR)  Continuous       Question Answer Comment  In the event of cardiac or respiratory  ARREST Do not call a "code blue"   In the event of cardiac or respiratory ARREST Do not perform Intubation, CPR, defibrillation or ACLS   In the event of cardiac or respiratory ARREST Use medication by any route, position, wound care, and other  measures to relive pain and suffering. May use oxygen, suction and manual treatment of airway obstruction as needed for comfort.      01/20/21 2241           Code Status History     Date Active Date Inactive Code Status Order ID Comments User Context   03/21/2020 1624 03/24/2020 1821 DNR [id number]  Karmen Bongo, MD ED   12/25/2018 2347 12/29/2018 1657 DNR [id number]  Lenore Cordia, MD ED   06/08/2018 0232 06/11/2018 1713 DNR [id number]  Norval Morton, MD ED   06/05/2018 1048 06/07/2018 1420 DNR [id number]  Karmen Bongo, MD ED      Advance Directive Documentation    Flowsheet Row Most Recent Value  Type of Advance Directive Out of facility DNR (pink MOST or yellow form)  Pre-existing out of facility DNR order (yellow form or pink MOST form) Pink MOST/Yellow Form most recent copy in chart - Physician notified to receive inpatient order  "MOST" Form in Place? --         IV Access:   Peripheral IV   Procedures and diagnostic studies:   No results found.   Medical Consultants:   None.   Subjective:    [name] [name] more awake this morning did not want open his eyes but was able to communicate with me  Objective:    Vitals:   01/25/21 1945 01/25/21 2300 01/26/21 0337 01/26/21 0723  BP: (!) 123/98 (!) 113/51 113/81 99/73  Pulse: 65 66 60 (!) 59  Resp: 16 16 16 16   Temp: 98.2 F (36.8 C)  99.8 F (37.7 C) 98.7 F (37.1 C)  TempSrc: Axillary  Axillary Axillary  SpO2:  97% 100% 100%  Weight:      Height:       SpO2: 100 % O2 Flow Rate (L/min): 6 L/min   Intake/Output Summary (Last 24 hours) at 01/26/2021 1003 Last data filed at 01/26/2021 0531 Gross per 24 hour  Intake 1406.45 ml  Output 925 ml  Net 481.45 ml    Filed Weights   01/21/21 0647 01/22/21 0300  Weight: 63.3 kg 68.2 kg    Exam: General exam: In no acute distress. Respiratory system: Good air movement and clear to auscultation. Cardiovascular system: S1 & S2 heard, RRR. No  JVD. Gastrointestinal system: Abdomen is nondistended, soft and nontender.  Extremities: No pedal edema. Skin: No rashes, lesions or ulcers  Data Reviewed:    Labs: Basic Metabolic Panel: Recent Labs  Lab 01/21/21 0428 01/22/21 0533 01/23/21 0043 01/24/21 0203 01/25/21 0627 01/26/21 0037  NA 138 138 139 140 142 140  K 4.0 4.7 3.6 3.3* 4.0 4.3  CL 114* 114* 116* 118* 122* 124*  CO2 15* 14* 16* 15* 13* 11*  GLUCOSE 95 103* 107* 95 89 104*  BUN 97* 109* 96* 78* 70* 67*  CREATININE 2.75* 3.02* 2.88* 2.92* 3.27* 3.25*  CALCIUM 7.4* 8.6* 8.5* 8.4* 8.7* 8.4*  PHOS 3.1 3.8  --   --   --   --     GFR Estimated Creatinine Clearance: 13.7 mL/min (A) (by C-G formula based on SCr of 3.25 mg/dL (H)). Liver Function Tests: Recent Labs  Lab 01/20/21 2035 01/21/21 0428 01/22/21 0533  AST 21  --   --   ALT 13  --   --   ALKPHOS 71  --   --   BILITOT 0.2*  --   --   PROT 6.4*  --   --   ALBUMIN 2.6* 2.0* 2.0*    Recent Labs  Lab 01/20/21 2035  LIPASE 44    No results for input(s): AMMONIA in the last 168 hours. Coagulation profile No results for input(s): INR, PROTIME in the last 168 hours. COVID-19 Labs  No results for input(s): DDIMER, FERRITIN, LDH, CRP in the last 72 hours.  Lab Results  Component Value Date   [place] NEGATIVE 01/21/2021   [place] NEGATIVE 03/21/2020   [place] NEGATIVE 12/25/2018    CBC: Recent Labs  Lab 01/20/21 2035 01/21/21 0427 01/21/21 1606 01/22/21 0533 01/23/21 0043 01/24/21 0203  WBC 7.0 5.4 8.8 8.4 9.8 6.8  NEUTROABS 5.5  --   --   --   --   --   HGB 7.2* 5.9* 6.4* 6.4* 8.9* 9.0*  HCT 22.8* 19.1* 20.1* 20.4* 27.3* 27.4*  MCV 103.6* 100.5* 98.5 97.1 94.8 96.1  PLT 212 146* 152 136* 122* 130*    Cardiac Enzymes: No results for input(s): CKTOTAL, CKMB, CKMBINDEX, TROPONINI in the last 168 hours. BNP (last 3 results) No results for input(s): PROBNP in the last 8760 hours. CBG: No results for input(s): GLUCAP in  the last 168 hours. D-Dimer: No results for input(s): DDIMER in the last 72 hours. Hgb A1c: No results for input(s): HGBA1C in the last 72 hours. Lipid Profile: No results for input(s): CHOL, HDL, LDLCALC, TRIG, CHOLHDL, LDLDIRECT in the last 72 hours. Thyroid function studies: No results for input(s): TSH, T4TOTAL, T3FREE, THYROIDAB in the last 72 hours.  Invalid input(s): FREET3 Anemia work up: No results for input(s): VITAMINB12, FOLATE, FERRITIN, TIBC, IRON, RETICCTPCT in the last 72 hours. Sepsis Labs: Recent Labs  Lab 01/21/21 1606 01/22/21 0533 01/23/21 0043 01/24/21 0203  WBC 8.8 8.4 9.8 6.8    Microbiology Recent Results (from the past 240 hour(s))  Resp Panel by RT-PCR (Flu A&B, Covid) Nasopharyngeal Swab     Status: None   Collection Time: 01/21/21  4:30 AM   Specimen: Nasopharyngeal Swab; Nasopharyngeal(NP) swabs in vial transport medium  Result Value Ref Range Status   SARS Coronavirus 2 by RT PCR NEGATIVE NEGATIVE Final    Comment: (NOTE) SARS-CoV-2 target nucleic acids are NOT DETECTED.  The SARS-CoV-2 RNA is generally detectable in upper respiratory specimens during the acute phase of infection. The lowest concentration of SARS-CoV-2 viral copies this assay can detect is 138 copies/mL. A negative result does not preclude SARS-Cov-2 infection and should not be used as the sole basis for treatment or other patient management decisions. A negative result may occur with  improper specimen collection/handling, submission of specimen other than nasopharyngeal swab, presence of viral mutation(s) within the areas targeted by this assay, and inadequate number of viral copies(<138 copies/mL). A negative result must be combined with clinical observations, patient history, and epidemiological information. The expected result is Negative.  Fact Sheet for Patients:  EntrepreneurPulse.com.au  Fact Sheet for Healthcare Providers:   IncredibleEmployment.be  This test is no t yet approved or cleared by the [place] FDA and  has been authorized for detection and/or diagnosis of SARS-CoV-2 by FDA under an Emergency Use Authorization (EUA). This EUA will remain  in effect (meaning this test can be used) for the duration of the COVID-19 declaration under  Section 564(b)(1) of the Act, 21 U.S.C.section 360bbb-3(b)(1), unless the authorization is terminated  or revoked sooner.       Influenza A by PCR NEGATIVE NEGATIVE Final   Influenza B by PCR NEGATIVE NEGATIVE Final    Comment: (NOTE) The Xpert Xpress SARS-CoV-2/FLU/RSV plus assay is intended as an aid in the diagnosis of influenza from Nasopharyngeal swab specimens and should not be used as a sole basis for treatment. Nasal washings and aspirates are unacceptable for Xpert Xpress SARS-CoV-2/FLU/RSV testing.  Fact Sheet for Patients: EntrepreneurPulse.com.au  Fact Sheet for Healthcare Providers: IncredibleEmployment.be  This test is not yet approved or cleared by the [place] FDA and has been authorized for detection and/or diagnosis of SARS-CoV-2 by FDA under an Emergency Use Authorization (EUA). This EUA will remain in effect (meaning this test can be used) for the duration of the COVID-19 declaration under Section 564(b)(1) of the Act, 21 U.S.C. section 360bbb-3(b)(1), unless the authorization is terminated or revoked.  Performed at [facility], [place] [address] [place], [place] [address]   Urine Culture     Status: Abnormal   Collection Time: 01/21/21 11:05 AM   Specimen: Urine, Clean Catch  Result Value Ref Range Status   Specimen Description URINE, CLEAN CATCH  Final   Special Requests   Final    NONE Performed at [facility], [place] [address] [place], [place] [address]    Culture 20,000 COLONIES/mL YEAST (A)  Final   Report Status 01/22/2021 FINAL  Final      Medications:    atorvastatin  40 mg Oral QPM   chlorhexidine  15 mL Mouth Rinse BID   cholecalciferol  2,000 Units Oral QHS   donepezil  5 mg Oral BID   ferrous sulfate  325 mg Oral Daily   finasteride  5 mg Oral QPM   mouth rinse  15 mL Mouth Rinse q12n4p   melatonin  3 mg Oral QHS   pantoprazole  40 mg Oral BID   sertraline  25 mg Oral Daily   Continuous Infusions:  cefTRIAXone (ROCEPHIN)  IV Stopped (01/25/21 1045)   fluconazole (DIFLUCAN) IV Stopped (01/25/21 1149)      LOS: 6 days   Charlynne Cousins  [facility]  01/26/2021, 10:03 AM

## 2021-01-26 NOTE — Progress Notes (Signed)
Pt has swelling to right arm. Assessed PIV. IV in correct location.  Elevated arm on two pillows to reduce swelling.  Pt initially allowed this while daughter in room but later refused.  Will continue to encourage elevation of extremity. Payton Emerald, RN

## 2021-01-27 ENCOUNTER — Inpatient Hospital Stay (HOSPITAL_COMMUNITY): Payer: Medicare Other

## 2021-01-27 DIAGNOSIS — N3001 Acute cystitis with hematuria: Secondary | ICD-10-CM | POA: Diagnosis not present

## 2021-01-27 DIAGNOSIS — G9341 Metabolic encephalopathy: Secondary | ICD-10-CM | POA: Diagnosis not present

## 2021-01-27 DIAGNOSIS — D62 Acute posthemorrhagic anemia: Secondary | ICD-10-CM | POA: Diagnosis not present

## 2021-01-27 DIAGNOSIS — K922 Gastrointestinal hemorrhage, unspecified: Secondary | ICD-10-CM | POA: Diagnosis not present

## 2021-01-27 MED ORDER — SODIUM ACETATE 2 MEQ/ML IV SOLN: INTRAVENOUS | Status: DC

## 2021-01-27 MED ORDER — SODIUM BICARBONATE 8.4 % IV SOLN
INTRAVENOUS | Status: DC
  Filled 2021-01-27 (×2): qty 1000

## 2021-01-27 MED ORDER — SODIUM BICARBONATE 650 MG PO TABS: 650.0000 mg | ORAL_TABLET | Freq: Three times a day (TID) | ORAL | Status: DC

## 2021-01-27 NOTE — Progress Notes (Signed)
Pt refused scheduled PO meds and breakfast this morning. IV antibiotics given, as scheduled.

## 2021-01-27 NOTE — Progress Notes (Signed)
[facility] PROGRESS NOTE    Progress Note  [name]  NLG:[id number] DOB: Dec 17, 1925 DOA: 01/20/2021 PCP: [name], MD     Brief Narrative:   [name] [name] is an 87 y.o. male past medical history of permanent atrial fibrillation not on anticoagulation secondary to GI bleed, known diverticulosis, with elevated 2011 presents with melanotic stools and severe anemia secondary to GI bleed leading to acute kidney injury and also found to have urolithiasis.  Gastroenterology was consulted and recommended conservative management.  He is several units of packed red blood cells seen by urology underwent stent placement, with modest improvement in her acute kidney injury.   Assessment/Plan:   Acute GI bleeding Presumed diverticular GI was consulted recommended conservative management. Hemoglobin now improved. No further episode of bleeding. RBC tagged red studies for recurrent bleeding.  Acute blood loss anemia: Secondary to diverticular bleed, hemoglobin responded appropriately to 2 units of packed red blood cells. Hemoglobin this morning is 9.0 which is slowly trending up.  Nephrolithiasis: Urology was consulted recommended a stent placement 01/22/2021 2. Recommended to follow-up with Dr. Gilford Rile as an outpatient on 01/31/2021.  Acute kidney injury superimposed on chronic kidney disease stage IIIb: Likely multifactorial in the setting of hypovolemia, acute blood loss anemia, infectious etiology and hypotension. Change IV fluids IV fluids to D5 with bicarbonate supplement. He is about had about 1400 cc from his urine output. Renal function has remained about the same but his BUN is slowly trending down.  New acute metabolic encephalopathy: Of unclear etiology question of possibly an infectious etiology, urine looks significantly cloudy had a recent instrumentation, with lagging to improve renal function.  UA appears to be infected urine cultures and blood cultures are  pending see below for the details. Daughter at bedside and relates his mentation is significantly improved compared to yesterday.  Possible UTI: Urine looks significantly cloudy with the recent instrumentation with a lactam and improvement in his renal function. UA showed many white blood cells and many bacteria. Continue IV Rocephin and IV Diflucan urine cultures are pending.  Non-anion gap metabolic acidosis: Chloride is trending high we will start him on D5 with bicarbonate supplements.  His BUN and creatinine are improving slowly, recheck labs in the morning.  BpH: Continue finasteride.  Advanced Alzheimer's dementia with behavioral disturbances: Continue donezepil & Ativan.  Permanent atrial fibrillation: Rate control not on anticoagulation due to GI bleed.  Acute confusional state: Secondarily to acute illness in the setting of underlying dementia, use melatonin at bedtime seroquel as needed.  Hyperlipidemia: At this point in time 87 year old, we will go ahead and discontinue statins  Coronary artery disease: Stable discontinue Lipitor continue aspirin when able to tolerate.    Ethics/goals of care: Will consult hospice and palliative care.  Severe protein caloric malnutrition: Noted  DVT prophylaxis: lovenox Family Communication:daughter Status is: Inpatient  Remains inpatient appropriate because:Hemodynamically unstable  Dispo: The patient is from: SNF              Anticipated d/c is to: SNF              Patient currently is not medically stable to d/c.   Difficult to place patient No        Code Status:     Code Status Orders  (From admission, onward)           Start     Ordered   01/20/21 2239  Do not attempt resuscitation (DNR)  Continuous  Question Answer Comment  In the event of cardiac or respiratory ARREST Do not call a "code blue"   In the event of cardiac or respiratory ARREST Do not perform Intubation, CPR, defibrillation or  ACLS   In the event of cardiac or respiratory ARREST Use medication by any route, position, wound care, and other measures to relive pain and suffering. May use oxygen, suction and manual treatment of airway obstruction as needed for comfort.      01/20/21 2241           Code Status History     Date Active Date Inactive Code Status Order ID Comments User Context   03/21/2020 1624 03/24/2020 1821 DNR [id number]  Karmen Bongo, MD ED   12/25/2018 2347 12/29/2018 1657 DNR [id number]  Lenore Cordia, MD ED   06/08/2018 0232 06/11/2018 1713 DNR [id number]  Norval Morton, MD ED   06/05/2018 1048 06/07/2018 1420 DNR [id number]  Karmen Bongo, MD ED      Advance Directive Documentation    Flowsheet Row Most Recent Value  Type of Advance Directive Out of facility DNR (pink MOST or yellow form)  Pre-existing out of facility DNR order (yellow form or pink MOST form) Pink MOST/Yellow Form most recent copy in chart - Physician notified to receive inpatient order  "MOST" Form in Place? --         IV Access:   Peripheral IV   Procedures and diagnostic studies:   No results found.   Medical Consultants:   None.   Subjective:    ADIT RIDDLES sleepy this morning  Objective:    Vitals:   01/26/21 2017 01/27/21 0002 01/27/21 0350 01/27/21 0752  BP: (!) 141/53 (!) 101/51 (!) 100/52 109/60  Pulse: 65 61 70 70  Resp: 18 20 18 19   Temp: 98.4 F (36.9 C) 99.7 F (37.6 C) 98.8 F (37.1 C) 98.7 F (37.1 C)  TempSrc: Oral Axillary Axillary Axillary  SpO2: 100% 98% 100% 99%  Weight:   64.4 kg   Height:       SpO2: 99 % O2 Flow Rate (L/min): 6 L/min   Intake/Output Summary (Last 24 hours) at 01/27/2021 0852 Last data filed at 01/27/2021 0220 Gross per 24 hour  Intake 150 ml  Output --  Net 150 ml    Filed Weights   01/21/21 0647 01/22/21 0300 01/27/21 0350  Weight: 63.3 kg 68.2 kg 64.4 kg    Exam: General exam: In no acute distress, cachectic Respiratory  system: Good air movement and clear to auscultation. Cardiovascular system: S1 & S2 heard, RRR. No JVD. Gastrointestinal system: Abdomen is nondistended, soft and nontender.  Extremities: No pedal edema. Skin: No rashes, lesions or ulcers  Data Reviewed:    Labs: Basic Metabolic Panel: Recent Labs  Lab 01/21/21 0428 01/22/21 0533 01/23/21 0043 01/24/21 0203 01/25/21 0627 01/26/21 0037  NA 138 138 139 140 142 140  K 4.0 4.7 3.6 3.3* 4.0 4.3  CL 114* 114* 116* 118* 122* 124*  CO2 15* 14* 16* 15* 13* 11*  GLUCOSE 95 103* 107* 95 89 104*  BUN 97* 109* 96* 78* 70* 67*  CREATININE 2.75* 3.02* 2.88* 2.92* 3.27* 3.25*  CALCIUM 7.4* 8.6* 8.5* 8.4* 8.7* 8.4*  PHOS 3.1 3.8  --   --   --   --     GFR Estimated Creatinine Clearance: 12.9 mL/min (A) (by C-G formula based on SCr of 3.25 mg/dL (H)). Liver Function Tests: Recent Labs  Lab 01/20/21  2035 01/21/21 0428 01/22/21 0533  AST 21  --   --   ALT 13  --   --   ALKPHOS 71  --   --   BILITOT 0.2*  --   --   PROT 6.4*  --   --   ALBUMIN 2.6* 2.0* 2.0*    Recent Labs  Lab 01/20/21 2035  LIPASE 44    No results for input(s): AMMONIA in the last 168 hours. Coagulation profile No results for input(s): INR, PROTIME in the last 168 hours. COVID-19 Labs  No results for input(s): DDIMER, FERRITIN, LDH, CRP in the last 72 hours.  Lab Results  Component Value Date   [place] NEGATIVE 01/21/2021   [place] NEGATIVE 03/21/2020   [place] NEGATIVE 12/25/2018    CBC: Recent Labs  Lab 01/20/21 2035 01/21/21 0427 01/21/21 1606 01/22/21 0533 01/23/21 0043 01/24/21 0203  WBC 7.0 5.4 8.8 8.4 9.8 6.8  NEUTROABS 5.5  --   --   --   --   --   HGB 7.2* 5.9* 6.4* 6.4* 8.9* 9.0*  HCT 22.8* 19.1* 20.1* 20.4* 27.3* 27.4*  MCV 103.6* 100.5* 98.5 97.1 94.8 96.1  PLT 212 146* 152 136* 122* 130*    Cardiac Enzymes: No results for input(s): CKTOTAL, CKMB, CKMBINDEX, TROPONINI in the last 168 hours. BNP (last 3  results) No results for input(s): PROBNP in the last 8760 hours. CBG: No results for input(s): GLUCAP in the last 168 hours. D-Dimer: No results for input(s): DDIMER in the last 72 hours. Hgb A1c: No results for input(s): HGBA1C in the last 72 hours. Lipid Profile: No results for input(s): CHOL, HDL, LDLCALC, TRIG, CHOLHDL, LDLDIRECT in the last 72 hours. Thyroid function studies: No results for input(s): TSH, T4TOTAL, T3FREE, THYROIDAB in the last 72 hours.  Invalid input(s): FREET3 Anemia work up: No results for input(s): VITAMINB12, FOLATE, FERRITIN, TIBC, IRON, RETICCTPCT in the last 72 hours. Sepsis Labs: Recent Labs  Lab 01/21/21 1606 01/22/21 0533 01/23/21 0043 01/24/21 0203  WBC 8.8 8.4 9.8 6.8    Microbiology Recent Results (from the past 240 hour(s))  Resp Panel by RT-PCR (Flu A&B, Covid) Nasopharyngeal Swab     Status: None   Collection Time: 01/21/21  4:30 AM   Specimen: Nasopharyngeal Swab; Nasopharyngeal(NP) swabs in vial transport medium  Result Value Ref Range Status   SARS Coronavirus 2 by RT PCR NEGATIVE NEGATIVE Final    Comment: (NOTE) SARS-CoV-2 target nucleic acids are NOT DETECTED.  The SARS-CoV-2 RNA is generally detectable in upper respiratory specimens during the acute phase of infection. The lowest concentration of SARS-CoV-2 viral copies this assay can detect is 138 copies/mL. A negative result does not preclude SARS-Cov-2 infection and should not be used as the sole basis for treatment or other patient management decisions. A negative result may occur with  improper specimen collection/handling, submission of specimen other than nasopharyngeal swab, presence of viral mutation(s) within the areas targeted by this assay, and inadequate number of viral copies(<138 copies/mL). A negative result must be combined with clinical observations, patient history, and epidemiological information. The expected result is Negative.  Fact Sheet for  Patients:  EntrepreneurPulse.com.au  Fact Sheet for Healthcare Providers:  IncredibleEmployment.be  This test is no t yet approved or cleared by the [place] FDA and  has been authorized for detection and/or diagnosis of SARS-CoV-2 by FDA under an Emergency Use Authorization (EUA). This EUA will remain  in effect (meaning this test can be used) for the  duration of the COVID-19 declaration under Section 564(b)(1) of the Act, 21 U.S.C.section 360bbb-3(b)(1), unless the authorization is terminated  or revoked sooner.       Influenza A by PCR NEGATIVE NEGATIVE Final   Influenza B by PCR NEGATIVE NEGATIVE Final    Comment: (NOTE) The Xpert Xpress SARS-CoV-2/FLU/RSV plus assay is intended as an aid in the diagnosis of influenza from Nasopharyngeal swab specimens and should not be used as a sole basis for treatment. Nasal washings and aspirates are unacceptable for Xpert Xpress SARS-CoV-2/FLU/RSV testing.  Fact Sheet for Patients: EntrepreneurPulse.com.au  Fact Sheet for Healthcare Providers: IncredibleEmployment.be  This test is not yet approved or cleared by the [place] FDA and has been authorized for detection and/or diagnosis of SARS-CoV-2 by FDA under an Emergency Use Authorization (EUA). This EUA will remain in effect (meaning this test can be used) for the duration of the COVID-19 declaration under Section 564(b)(1) of the Act, 21 U.S.C. section 360bbb-3(b)(1), unless the authorization is terminated or revoked.  Performed at [facility], [place] [address] [place], [place] [address]   Urine Culture     Status: Abnormal   Collection Time: 01/21/21 11:05 AM   Specimen: Urine, Clean Catch  Result Value Ref Range Status   Specimen Description URINE, CLEAN CATCH  Final   Special Requests   Final    NONE Performed at [facility], [place] [address], [place], [place] [address]     Culture 20,000 COLONIES/mL YEAST (A)  Final   Report Status 01/22/2021 FINAL  Final  Urine Culture     Status: Abnormal   Collection Time: 01/25/21  9:16 AM   Specimen: Urine, Clean Catch  Result Value Ref Range Status   Specimen Description URINE, CLEAN CATCH  Final   Special Requests   Final    NONE Performed at [facility], [place] [address], [place], [place] [address]    Culture MULTIPLE SPECIES PRESENT, SUGGEST RECOLLECTION (A)  Final   Report Status 01/26/2021 FINAL  Final     Medications:    atorvastatin  40 mg Oral QPM   chlorhexidine  15 mL Mouth Rinse BID   cholecalciferol  2,000 Units Oral QHS   donepezil  5 mg Oral BID   ferrous sulfate  325 mg Oral Daily   finasteride  5 mg Oral QPM   mouth rinse  15 mL Mouth Rinse q12n4p   melatonin  3 mg Oral QHS   pantoprazole  40 mg Oral BID   sertraline  25 mg Oral Daily   sodium bicarbonate  650 mg Oral TID   Continuous Infusions:  cefTRIAXone (ROCEPHIN)  IV 1 g (01/27/21 0804)   fluconazole (DIFLUCAN) IV Stopped (01/26/21 1306)      LOS: 7 days   Charlynne Cousins  [facility]  01/27/2021, 8:52 AM

## 2021-01-27 NOTE — Progress Notes (Signed)
Occupational Therapy Treatment Patient Details Name: [name] [name] MRN: [id number] DOB: 10/07/1925 Today's Date: 01/27/2021    History of present illness Pt is a 87 y.o. M who presents with 24 hours of diarrhea and dark stools. Admitted for severe anemia secondary to GIB, AKI and also found to have ureterolithiasis. Seen by GI with recommendation for conservative management. Seen by urology and underwent stent placement. Significant PMH: atrial fibrillation, known diverticulosis, Alzheimer's disease, AAA, bilateral hip fractures.   OT comments  Pt with lethargy likely due to medication. Demonstrated poor sitting balance at EOB with posterior bias. Stood from bed with RW and +2 mod assist. Pt with urinary incontinence upon standing. Total assist for LB bathing and dressing. Personal care worker in room throughout session and encouraging pt. She reports pt can do a lot of his ADLs himself at home.   Follow Up Recommendations  SNF;Supervision/Assistance - 24 hour    Equipment Recommendations  Other (comment) (defer to next venue)    Recommendations for Other Services      Precautions / Restrictions Precautions Precautions: Fall Precaution Comments: potential to get agitated       Mobility Bed Mobility Overal bed mobility: Needs Assistance Bed Mobility: Rolling;Sidelying to Sit;Sit to Supine Rolling: Max assist;+2 for safety/equipment Sidelying to sit: Max assist;+2 for safety/equipment   Sit to supine: Mod assist;+2 for physical assistance   General bed mobility comments: Initially asleep, then lethargic needing extra assist for direction and follow through.  Pt assisted more with return to supine and repositioning.    Transfers Overall transfer level: Needs assistance Equipment used: Rolling walker (2 wheeled) Transfers: Sit to/from Stand Sit to Stand: Mod assist;+2 physical assistance         General transfer comment: cues for direction and assist forward even more than  boosting, stood x 2 from EOB    Balance Overall balance assessment: Needs assistance Sitting-balance support: Feet supported Sitting balance-Leahy Scale: Poor Sitting balance - Comments: reliant on support or slowly falls back onto bil elbows. Postural control: Posterior lean Standing balance support: Bilateral upper extremity supported Standing balance-Leahy Scale: Poor Standing balance comment: reliant on external support and/or AD.  During 2 standing trials, pt incontinent of urine/stool and needed peri care that was completed while pt was standing at EOB in the RW.                           ADL either performed or assessed with clinical judgement   ADL Overall ADL's : Needs assistance/impaired                                       General ADL Comments: total assist, reaches to face/top of head with mitts removed     Vision       Perception     Praxis      Cognition Arousal/Alertness: Lethargic Behavior During Therapy: Flat affect;WFL for tasks assessed/performed Overall Cognitive Status: History of cognitive impairments - at baseline                                 General Comments: pt had medication last night and remains sleepy, but arousable        Exercises    Shoulder Instructions       General Comments Pt's outside caregiver  present and supplied insight into his recent LOF.    Pertinent Vitals/ Pain       Pain Assessment: Faces Faces Pain Scale: No hurt Pain Intervention(s): Monitored during session  Home Living                                          Prior Functioning/Environment              Frequency  Min 2X/week        Progress Toward Goals  OT Goals(current goals can now be found in the care plan section)  Progress towards OT goals: Progressing toward goals  Acute Rehab OT Goals Patient Stated Goal: pt wife would like him to go to SNF OT Goal Formulation: With  patient Time For Goal Achievement: 02/08/21 Potential to Achieve Goals: Good  Plan Discharge plan remains appropriate    Co-evaluation    PT/OT/SLP Co-Evaluation/Treatment: Yes Reason for Co-Treatment: Complexity of the patient's impairments (multi-system involvement);Necessary to address cognition/behavior during functional activity PT goals addressed during session: Mobility/safety with mobility OT goals addressed during session: Strengthening/ROM      AM-PAC OT "6 Clicks" Daily Activity     Outcome Measure   Help from another person eating meals?: Total Help from another person taking care of personal grooming?: Total Help from another person toileting, which includes using toliet, bedpan, or urinal?: Total Help from another person bathing (including washing, rinsing, drying)?: Total Help from another person to put on and taking off regular upper body clothing?: Total Help from another person to put on and taking off regular lower body clothing?: Total 6 Click Score: 6    End of Session    OT Visit Diagnosis: Other abnormalities of gait and mobility (R26.89);Muscle weakness (generalized) (M62.81);Other symptoms and signs involving cognitive function   Activity Tolerance Patient limited by lethargy   Patient Left in bed;with call bell/phone within reach;with bed alarm set;with family/visitor present   Nurse Communication Mobility status;Other (comment) (assist for pericare)        Time: 4097-3532 OT Time Calculation (min): 26 min  Charges: OT General Charges $OT Visit: 1 Visit OT Treatments $Therapeutic Activity: 8-22 mins  Nestor Lewandowsky, OTR/L Acute Rehabilitation Services Pager: [phone] Office: [phone]    [name], [name] 01/27/2021, 2:56 PM

## 2021-01-27 NOTE — Progress Notes (Signed)
Physical Therapy Treatment Patient Details Name: [name] [name] MRN: [id number] DOB: 11-10-1925 Today's Date: 01/27/2021    History of Present Illness Pt is a 87 y.o. M who presents with 24 hours of diarrhea and dark stools. Admitted for severe anemia secondary to GIB, AKI and also found to have ureterolithiasis. Seen by GI with recommendation for conservative management. Seen by urology and underwent stent placement. Significant PMH: atrial fibrillation, known diverticulosis, Alzheimer's disease, AAA, bilateral hip fractures.    PT Comments    Pt somewhat lethargic today and making slow progress toward goals.  Emphasis on transition to EOB, sitting balance, trials of sit to stands which led to standing for peri care due to incontinence.  Pt able to march in place on one of the standing trials and side stepped toward [facility] after 2nd standing trial before getting back into bed.  Pt generally mod to max of 2 persons today.    Follow Up Recommendations  SNF     Equipment Recommendations  None recommended by PT    Recommendations for Other Services       Precautions / Restrictions Precautions Precautions: Fall Precaution Comments: potential to get agitated    Mobility  Bed Mobility Overal bed mobility: Needs Assistance Bed Mobility: Rolling;Sidelying to Sit;Sit to Supine Rolling: Max assist;+2 for safety/equipment Sidelying to sit: Max assist;+2 for safety/equipment   Sit to supine: Mod assist;+2 for physical assistance   General bed mobility comments: Initially asleep, then lethargic needing extra assist for direction and follow through.  Pt assisted more with return to supine and repositioning.    Transfers Overall transfer level: Needs assistance Equipment used: Rolling walker (2 wheeled);None Transfers: Sit to/from Stand Sit to Stand: Mod assist;+2 physical assistance         General transfer comment: cues for direction and assist forward even more than  boosting.  Ambulation/Gait Ambulation/Gait assistance: Mod assist Gait Distance (Feet): 3 Feet Assistive device: Rolling walker (2 wheeled) Gait Pattern/deviations: Step-to pattern     General Gait Details: 3-4 side steps up toward HOB prior to sitting.   Stairs             Wheelchair Mobility    Modified Rankin (Stroke Patients Only)       Balance Overall balance assessment: Needs assistance Sitting-balance support: Feet supported Sitting balance-Leahy Scale: Poor Sitting balance - Comments: reliant on support or slowly falls back onto bil elbows. Postural control: Posterior lean   Standing balance-Leahy Scale: Poor Standing balance comment: reliant on external support and/or AD.  During 2 standing trials, pt incontinent of urine/stool and needed peri care that was completed while pt was standing at EOB in the RW.                            Cognition Arousal/Alertness: Lethargic Behavior During Therapy: Flat affect;WFL for tasks assessed/performed Overall Cognitive Status: History of cognitive impairments - at baseline                                        Exercises Other Exercises Other Exercises: graded hip/knee flexion/ext x 10 reps bil for warm up.    General Comments General comments (skin integrity, edema, etc.): Pt's outside caregiver present and supplied insight into his recent LOF.      Pertinent Vitals/Pain Pain Assessment: Faces Faces Pain Scale: No hurt Pain Intervention(s):  Monitored during session    Home Living                      Prior Function            PT Goals (current goals can now be found in the care plan section) Acute Rehab PT Goals Patient Stated Goal: pt wife would like him to go to SNF PT Goal Formulation: With patient Time For Goal Achievement: 02/07/21 Potential to Achieve Goals: Fair Progress towards PT goals: Progressing toward goals    Frequency    Min 2X/week       PT Plan Current plan remains appropriate    Co-evaluation PT/OT/SLP Co-Evaluation/Treatment: Yes Reason for Co-Treatment: Complexity of the patient's impairments (multi-system involvement);To address functional/ADL transfers PT goals addressed during session: Mobility/safety with mobility        AM-PAC PT "6 Clicks" Mobility   Outcome Measure  Help needed turning from your back to your side while in a flat bed without using bedrails?: A Lot Help needed moving from lying on your back to sitting on the side of a flat bed without using bedrails?: A Lot Help needed moving to and from a bed to a chair (including a wheelchair)?: A Lot Help needed standing up from a chair using your arms (e.g., wheelchair or bedside chair)?: A Lot Help needed to walk in hospital room?: A Lot Help needed climbing 3-5 steps with a railing? : Total 6 Click Score: 11    End of Session   Activity Tolerance: Patient limited by lethargy;Patient tolerated treatment well Patient left: in bed;with call bell/phone within reach;with bed alarm set;with family/visitor present Nurse Communication: Mobility status PT Visit Diagnosis: Unsteadiness on feet (R26.81);Muscle weakness (generalized) (M62.81);Difficulty in walking, not elsewhere classified (R26.2)     Time: 2836-6294 PT Time Calculation (min) (ACUTE ONLY): 26 min  Charges:  $Therapeutic Activity: 8-22 mins                     01/27/2021  [name] [name]., PT Acute Rehabilitation Services [phone]  (pager) [phone]  (office)   [name] [name] 01/27/2021, 2:47 PM

## 2021-01-28 DIAGNOSIS — N3001 Acute cystitis with hematuria: Secondary | ICD-10-CM | POA: Diagnosis not present

## 2021-01-28 DIAGNOSIS — D62 Acute posthemorrhagic anemia: Secondary | ICD-10-CM | POA: Diagnosis not present

## 2021-01-28 DIAGNOSIS — Z66 Do not resuscitate: Secondary | ICD-10-CM | POA: Diagnosis not present

## 2021-01-28 DIAGNOSIS — Z515 Encounter for palliative care: Secondary | ICD-10-CM | POA: Diagnosis not present

## 2021-01-28 DIAGNOSIS — K922 Gastrointestinal hemorrhage, unspecified: Secondary | ICD-10-CM | POA: Diagnosis not present

## 2021-01-28 DIAGNOSIS — Z7189 Other specified counseling: Secondary | ICD-10-CM

## 2021-01-28 DIAGNOSIS — G9341 Metabolic encephalopathy: Secondary | ICD-10-CM | POA: Diagnosis not present

## 2021-01-28 LAB — BASIC METABOLIC PANEL
Anion gap: 8 (ref 5–15)
Anion gap: 7 (ref 5–15)
BUN: 73 mg/dL — ABNORMAL HIGH (ref 8–23)
BUN: 74 mg/dL — ABNORMAL HIGH (ref 8–23)
CO2: 16 mmol/L — ABNORMAL LOW (ref 22–32)
CO2: 18 mmol/L — ABNORMAL LOW (ref 22–32)
Calcium: 8.3 mg/dL — ABNORMAL LOW (ref 8.9–10.3)
Calcium: 8.1 mg/dL — ABNORMAL LOW (ref 8.9–10.3)
Chloride: 119 mmol/L — ABNORMAL HIGH (ref 98–111)
Chloride: 117 mmol/L — ABNORMAL HIGH (ref 98–111)
Creatinine, Ser: 4.49 mg/dL — ABNORMAL HIGH (ref 0.61–1.24)
Creatinine, Ser: 4.67 mg/dL — ABNORMAL HIGH (ref 0.61–1.24)
GFR, Estimated: 12 mL/min — ABNORMAL LOW (ref >60)
GFR, Estimated: 11 mL/min — ABNORMAL LOW (ref >60)
Glucose, Bld: 133 mg/dL — ABNORMAL HIGH (ref 70–99)
Glucose, Bld: 141 mg/dL — ABNORMAL HIGH (ref 70–99)
Potassium: 3.9 mmol/L (ref 3.5–5.1)
Potassium: 3.4 mmol/L — ABNORMAL LOW (ref 3.5–5.1)
Sodium: 143 mmol/L (ref 135–145)
Sodium: 142 mmol/L (ref 135–145)

## 2021-01-28 LAB — CBC WITH DIFFERENTIAL/PLATELET
Abs Immature Granulocytes: 0.2 10*3/uL — ABNORMAL HIGH (ref 0.00–0.07)
Basophils Absolute: 0 10*3/uL (ref 0.0–0.1)
Basophils Relative: 0 %
Eosinophils Absolute: 0.1 10*3/uL (ref 0.0–0.5)
Eosinophils Relative: 1 %
HCT: 27.5 % — ABNORMAL LOW (ref 39.0–52.0)
Hemoglobin: 9 g/dL — ABNORMAL LOW (ref 13.0–17.0)
Immature Granulocytes: 1 %
Lymphocytes Relative: 5 %
Lymphs Abs: 1.1 10*3/uL (ref 0.7–4.0)
MCH: 31.7 pg (ref 26.0–34.0)
MCHC: 32.7 g/dL (ref 30.0–36.0)
MCV: 96.8 fL (ref 80.0–100.0)
Monocytes Absolute: 2.6 10*3/uL — ABNORMAL HIGH (ref 0.1–1.0)
Monocytes Relative: 12 %
Neutro Abs: 18.1 10*3/uL — ABNORMAL HIGH (ref 1.7–7.7)
Neutrophils Relative %: 81 %
Platelets: 216 10*3/uL (ref 150–400)
RBC: 2.84 MIL/uL — ABNORMAL LOW (ref 4.22–5.81)
RDW: 17.6 % — ABNORMAL HIGH (ref 11.5–15.5)
WBC: 22.2 10*3/uL — ABNORMAL HIGH (ref 4.0–10.5)
nRBC: 0.1 % (ref 0.0–0.2)

## 2021-01-28 MED ORDER — CHLORHEXIDINE GLUCONATE CLOTH 2 % EX PADS
6.0000 | MEDICATED_PAD | Freq: Every day | CUTANEOUS | Status: DC
  Administered 2021-01-28: 6 via TOPICAL

## 2021-01-28 MED ORDER — LORAZEPAM 2 MG/ML IJ SOLN
1.0000 mg | INTRAMUSCULAR | Status: DC | PRN
  Administered 2021-01-29: 1 mg via INTRAVENOUS
  Filled 2021-01-28: qty 1

## 2021-01-28 MED ORDER — SODIUM CHLORIDE 0.9 % IV BOLUS: 500.0000 mL | Freq: Once | INTRAVENOUS | Status: DC

## 2021-01-28 MED ORDER — SODIUM BICARBONATE 8.4 % IV SOLN
INTRAVENOUS | Status: DC
  Filled 2021-01-28: qty 1000

## 2021-01-28 MED ORDER — SODIUM CHLORIDE 0.9 % IV SOLN
1.0000 g | INTRAVENOUS | Status: DC
  Filled 2021-01-28: qty 1

## 2021-01-28 MED ORDER — ONDANSETRON 4 MG PO TBDP: 4.0000 mg | ORAL_TABLET | Freq: Four times a day (QID) | ORAL | Status: DC | PRN

## 2021-01-28 MED ORDER — GLYCOPYRROLATE 0.2 MG/ML IJ SOLN: 0.2000 mg | INTRAMUSCULAR | Status: DC | PRN

## 2021-01-28 MED ORDER — HALOPERIDOL LACTATE 2 MG/ML PO CONC
0.5000 mg | ORAL | Status: DC | PRN
  Filled 2021-01-28: qty 0.3

## 2021-01-28 MED ORDER — SODIUM CHLORIDE 0.9 % IV SOLN: INTRAVENOUS | Status: DC

## 2021-01-28 MED ORDER — MORPHINE SULFATE (PF) 2 MG/ML IV SOLN
2.0000 mg | INTRAVENOUS | Status: DC | PRN
  Administered 2021-01-29: 2 mg via INTRAVENOUS
  Filled 2021-01-28: qty 1

## 2021-01-28 MED ORDER — HALOPERIDOL LACTATE 5 MG/ML IJ SOLN: 0.5000 mg | INTRAMUSCULAR | Status: DC | PRN

## 2021-01-28 MED ORDER — ONDANSETRON HCL 4 MG/2ML IJ SOLN: 4.0000 mg | Freq: Four times a day (QID) | INTRAMUSCULAR | Status: DC | PRN

## 2021-01-28 MED ORDER — GLYCOPYRROLATE 1 MG PO TABS
1.0000 mg | ORAL_TABLET | ORAL | Status: DC | PRN
  Filled 2021-01-28: qty 1

## 2021-01-28 MED ORDER — HALOPERIDOL 0.5 MG PO TABS
0.5000 mg | ORAL_TABLET | ORAL | Status: DC | PRN
  Filled 2021-01-28: qty 1

## 2021-01-28 NOTE — Progress Notes (Signed)
[facility] Community Based Palliative Care       This patient is currently followed by palliative care services in the community.  [facility] will continue to follow for any discharge planning needs and to coordinate continuation of palliative care.    Thank you for the opportunity to participate in this patient's care.     Chrislyn King, BSN, RN [facility] Hospital Liaison [phone] (24h on call) 

## 2021-01-28 NOTE — Progress Notes (Addendum)
Pharmacy Antibiotic Note  [name] [name] is a 87 y.o. male admitted on 01/20/2021 with suspected bacteremia likely 2/2 UTI. Underwent urologic manipulation in setting of worsening renal function, WBC elevated at 22. Pharmacy has been consulted for Cefepime dosing.   Plan: Start Cefepime 1 gm Q24H Monitor renal function, CBC, cultures F/U LOT and de-escalate as able  Height: 6' (182.9 cm) Weight: 64.4 kg (141 lb 15.6 oz) IBW/kg (Calculated) : 77.6  Temp (24hrs), Avg:98.6 F (37 C), Min:98.1 F (36.7 C), Max:98.9 F (37.2 C)  Recent Labs  Lab 01/21/21 1606 01/22/21 0533 01/22/21 0533 01/23/21 0043 01/24/21 0203 01/25/21 0627 01/26/21 0037 01/28/21 0112 01/28/21 0930  WBC 8.8 8.4  --  9.8 6.8  --   --   --  22.2*  CREATININE  --  3.02*   < > 2.88* 2.92* 3.27* 3.25* 4.49*  --    < > = values in this interval not displayed.    Estimated Creatinine Clearance: 9.4 mL/min (A) (by C-G formula based on SCr of 4.49 mg/dL (H)).    Allergies  Allergen Reactions   Amiodarone Other (See Comments)    Unknown reaction (Had a severe lung infection in 2003)   Antihistamines, Diphenhydramine-Type Other (See Comments)    Jittery   Lovastatin Nausea And Vomiting    Antimicrobials this admission: CTX 7/22 >> 7/25, 7/27 >> 7/30  Fluconazole 7/27 >>  Dose adjustments this admission: none  Microbiology results: 7/23 UCx: 20k Yeast 7/23 Ucx: mult spp, suggest recollection  Thank you for allowing pharmacy to be a part of this patient's care.  Laurey Arrow, PharmD PGY1 Pharmacy Resident 01/28/2021  1:25 PM  Please check [email or web address] for unit-specific pharmacy phone numbers.

## 2021-01-28 NOTE — TOC Progression Note (Signed)
Transition of Care [facility]) - Progression Note    Patient Details  Name: [name] [name] MRN: [id number] Date of Birth: 12-Nov-1925  Transition of Care [facility]) CM/SW [place], LCSW Phone Number:[phone] 01/28/2021, 12:37 PM  Clinical Narrative:     CSW attempted to follow up with Otilio Carpen at [facility] however do not think staff on the weekends. CSW left message to follow up with weekday CSW.  TOC team will continue to assist with discharge planning needs.   Expected Discharge Plan: [place] Barriers to Discharge: Continued Medical Work up, Other (must enter comment), SNF Pending bed offer (PT/OT recommendations)  Expected Discharge Plan and Services Expected Discharge Plan: [place] In-house Referral: Clinical Social Work     Living arrangements for the past 2 months: Single Family Home                                       Social Determinants of Health (SDOH) Interventions    Readmission Risk Interventions No flowsheet data found.

## 2021-01-28 NOTE — Consult Note (Signed)
Palliative Medicine Inpatient Consult Note  Consulting Provider: Charlynne Cousins, MD  Reason for consult:  "End of life care"  HPI:  Per intake H&P -->[name] [name] is a 87 y.o. M woth a h/o atrial fibrillation, known diverticulosis, Alzheimer's disease, AAA, bilateral hip fractures. He presented on 7/22 with 24 hours of diarrhea and dark stools. Admitted for severe anemia secondary to GIB, AKI and also found to have ureterolithiasis. Seen by GI with recommendation for conservative management. Seen by urology and underwent stent placement.   Palliative care has been asked t get involved as it appears [name] [name] was prior enrolled in Palliative care services through [facility] Asked to continue with [place] - End of life conversations.  Clinical Assessment/Goals of Care:  *Please note that this is a verbal dictation therefore any spelling or grammatical errors are due to the "[place]" system interpretation.  I have reviewed medical records including EPIC notes, labs and imaging, received report from bedside RN, assessed the patient who was lying in bed, somnolent but arousable.    I met with [name] [name] (spouse) and [name] [name] (son) to further discuss diagnosis prognosis, [place], EOL wishes, disposition and options.  A brief review of [name] [name]'s past medical history was completed.  We discussed his prior history of Alzheimer's dementia, A. fib, bilateral hip fractures -the left in 2020 and the right in 2021, and more acutely, GI bleeding.  [name] [name] shares with me that [name] [name] was admitted for worsening kidney function.  She goes on to share that this was thought to be related to a kidney stone and [name] [name] has had a stent placed.  As of this morning he had notable urinary retention for which a Foley catheter was placed and this drained about 800 mL of urine.   I introduced Palliative Medicine as specialized medical care for people living with serious illness. It focuses on providing  relief from the symptoms and stress of a serious illness. The goal is to improve quality of life for both the patient and the family.  [name] [name] shares that she is familiar with palliative care as she had been meeting with [name] [name] practitioner, [name] [name] her last meeting was in June of this year.  [name] lives in [place], [place].  He is married, but this is a second marriage.  He has 5 children from his first marriage and 1 child from his second marriage.  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  He is a man of faith and practices within the [place] denomination.  Prior to hospitalization [name] had both a day and night caregiver.  His son, [name] [name] would help shower and shave him once a week through a portable shower.  He does have a commode in his home as he has both a front wheel walker and transportation wheelchair.  As far as basic activities of daily living he was able to help with putting his clothes on, feeding himself.  His family and caregivers would help with the rest of his needed activities.  A detailed discussion was had today regarding advanced directives -patient's wife, [name] [name] is his primary Media planner.    Concepts specific to code status, artifical feeding and hydration, continued IV antibiotics and rehospitalization was had.  A review of patient's prior MOST form was completed as below:   Cardiopulmonary Resuscitation: Do Not Attempt Resuscitation (DNR/No CPR)  Medical Interventions: Comfort Measures: Keep clean, warm, and dry. Use medication by any route, positioning, wound  care, and other measures to relieve pain and suffering. Use oxygen, suction and manual treatment of airway obstruction as needed for comfort. Do not transfer to the hospital unless comfort needs cannot be met in current location.  Antibiotics: Antibiotics if indicated  IV Fluids: IV fluids if indicated  Feeding Tube: No feeding tube   The difference between  a aggressive medical intervention path  and a palliative comfort care path for this patient at this time was had.  Ideally patient's family would wish for him to be more functional they realize that after this hospital stay he is more than likely to be weaker.  They shared that if his kidney values continue to improve they would be interested in him transitioning to [facility] home where he has been before and had a favorable experience.  We reviewed that if his kidneys continue to increase that is a more concerning situation whereby hospice care should be more strongly considered.  Patient's wife and son share that they would welcome hospice care if [facility] situation neglects to improve.  Patient's family had a less than favorable experience recently with outpatient palliative care provider.  They shared that there not sure if they would be interested in continuing to work with Manufacturing engineer.  We reviewed that if after speaking to them they no longer wish to work with them that [place] would be a reasonable choice if eventual hospice is identified as needed.  Discussed the importance of continued conversation with family and their  medical providers regarding overall plan of care and treatment options, ensuring decisions are within the context of the patients values and GOCs.  Decision Maker: [name] [name]: [phone]   SUMMARY OF RECOMMENDATIONS   DNAR/DNI  Treat the Treatable  DNR/MOST in [place]  Patients family would like to see if Cr/BUN improve with Foley if so they are interested in [facility]. If Cr/BUN continue to worsen they are open to [place] transitioning home with hospice.  Patients family had a less than favorable experience with [facility], I have brought it up with their liaison for additional conversations.  If patient needs hospice and they cannot work through the issues with [facility] they would be interested in [place].  Ongoing conversations in the oncoming days  Please see Below addendum as patients condition declined shortly after our visit and family opted for comfort care  Code Status/Advance Care Planning: DNAR/DNI   Palliative Prophylaxis:  Oral Care, Mobility  Additional Recommendations (Limitations, Scope, Preferences): Continue current scope of care  Psycho-social/Spiritual:  Desire for further Chaplaincy support: Yes  Additional Recommendations: Education on dementia (alzheimer's type)   Prognosis: Poor, patient would likely be appropriate for hospice.  Discharge Planning: Discharge plan is unclear. Much depends on the lab results.  Vitals:   01/27/21 2358 01/28/21 0343  BP: (!) 99/6 97/76  Pulse: 77 65  Resp: 17 17  Temp: 98.5 F (36.9 C) 98.7 F (37.1 C)  SpO2: 98% 98%    Intake/Output Summary (Last 24 hours) at 01/28/2021 0707 Last data filed at 01/28/2021 0450 Gross per 24 hour  Intake 1446.61 ml  Output 300 ml  Net 1146.61 ml   Last Weight  Most recent update: 01/27/2021  3:52 AM    Weight  64.4 kg (141 lb 15.6 oz)            Gen: Frail Elderly caucasian M in NAD HEENT: Dry mucous membranes CV: Regular rate and irregular  rhythm  PULM: 6LPM [place] ABD: soft/nontender  EXT: No edema Neuro: Alert and oriented x1  PPS: 30%   _______________________________________________________________ Addendum:  After seeing patient it appears his condition worsened to the point of septic shock. Dr. Olevia Bowens met with family and they had opted to transition to comfort oriented care.   This conversation/these recommendations were discussed with patient primary care team, Dr. Olevia Bowens  Time In: 1300 Time Out:1410 Total Time: 57 Greater than 50%  of this time was spent counseling and coordinating care related to the above assessment and plan.  [place] Team Team Cell Phone: [phone] Please utilize secure chat with  additional questions, if there is no response within 30 minutes please call the above phone number  Palliative Medicine Team providers are available by phone from 7am to 7pm daily and can be reached through the team cell phone.  Should this patient require assistance outside of these hours, please call the patient's attending physician.

## 2021-01-28 NOTE — Significant Event (Signed)
Rapid Response Event Note   Reason for Call :  hypotension  Initial Focused Assessment:  Patient is lethargic, can cough on command otherwise mostly moaning or incomprehensible speech.  He his pale.  Per RN foley recently placed with about a 1L pus as UOP.   Pt with DNR status BP 76/46  V paced 60  RR 16  O2 sat 91% on RA  Wife at bedside Dr Aileen Fass at bedside to assess patient and spoke with wife regarding goals of care.    Interventions:  Placed on NRB  O2 sats 100% 500 cc NS bolus  then MIVF at 100cc/hr  BP 81/48  V paced 60  Plan of Care:     Event Summary:   MD Notified: Aileen Fass Call Time: [place] Time: 2426 End Time: [place]  Raliegh Ip, RN

## 2021-01-28 NOTE — Progress Notes (Signed)
Notified by NT, pts BP of 78/47 then maintaining SBP in the 70s-60s, MAP 40s-50s. Pt displayed labored breathing and was cool and pale. Charge nurse notified, RR notified and responded, MD notified. MD ordered 500 ml bolus, given. Placed on non-rebreather at 15L.  Raelyn Number, RN

## 2021-01-28 NOTE — Progress Notes (Signed)
Patient ID: [name] [name], male   DOB: 1925/10/28, 87 y.o.   MRN: [id number]  Called by Dr. Aileen Fass for progressive CRI with persistent bilateral hydro on [place].   On my review the bladder is distended with findings consistent with chronic BOO and on the prior CT he had ureteral dilation to the bladder.   The left stent appears to be in good position.     Imp: BPH BOO and bilateral hydro with progress CRI.  Rec: Place foley to maximize drainage and repeat Cr in AM.

## 2021-01-28 NOTE — Progress Notes (Signed)
Had about a 30 to 45-minute conversation with the wife about his prognosis now that he is septic and his blood pressure is in the 60s with a MAP of 40. Explained to her that his system is slowly deteriorating and he has an extremely poor prognosis. She took the time to discuss with her family and the family and the wife decided to move towards comfort care. They would like all medications stopped that are not related for comfort no further lab draws or imaging. We will go ahead and DC antibiotics IV fluids and all other medications we will start him on IV morphine as needed and Ativan. We have contacted transition care team to try to get him to a residential hospice facility.

## 2021-01-28 NOTE — Progress Notes (Addendum)
Manufacturing engineer [facility]) Hospital Liaison note.    Received request from PMT NP Sharyn Lull for family interest in [facility] late in day due to acute decompensation. Chart and pt information under review by [facility] physician.  Hospice eligibility pending at this time.  Met at bedside with pt, pt's wife and children. Pt had just received Last Rites which was a great comfort to the family.  Pt was unresponsive throughout visit.  Pt's spouse shared that if pt were to pass at [facility] they wish for [facility] to be used.  Bedside RN made aware.    ACC will follow up in the morning. Please do not hesitate to call with questions.    Thank you for the opportunity to participate in this patient's care.  Domenic Moras, BSN, RN [facility] Liaison (listed on [place] under Hospice/[facility])    [phone] (24h on call)

## 2021-01-28 NOTE — Progress Notes (Signed)
Manufacturing engineer [facility]) Hospital Liaison note.    Received request from [place] for family interest in [facility] Chart and pt information under review by [facility] physician.  Hospice eligibility pending at this time.  Met with family at bedside.  Idelle Crouch had just left from offering Last Rites which brought great comfort to family. [facility] contact information given to family and questions answered. Exchanged report with bedside and verified that per cardiologist notes pt has a dual chamber pacemaker but not an AICD.  Pt's wife shared that she would want [facility] used if pt were to pass in the hospital.  RN made aware.  Hospital Liaison will follow up tomorrow. Please do not hesitate to call with questions.    Thank you for the opportunity to participate in this patient's care.  Domenic Moras, BSN, RN [facility] Liaison (listed on [place] under Hospice/[facility])    [phone] (24h on call)

## 2021-01-28 NOTE — Progress Notes (Signed)
   01/28/21 1438  Clinical Encounter Type  Visited With Other (Comment);Health care provider (Chaplain responded to nurse's page)  Visit Type Spiritual support  Referral From Nurse  Consult/Referral To Chaplain   Chaplain responded. Per nurse, the patient's family is requesting a Idelle Crouch. I called Father Barnabas Lister and left a voicemail message. I called unit secretary and asked her to inform the patient's nurse. Also advised Chaplain support is available if needed . This note was prepared by Jeanine Luz, M.Div..  For questions please contact by phone [phone].

## 2021-01-28 NOTE — Progress Notes (Signed)
[facility] PROGRESS NOTE    Progress Note  [name] [name]  JJO:[id number] DOB: 11/29/1925 DOA: 01/20/2021 PCP: [name], MD     Brief Narrative:   [name] [name] is an 87 y.o. male past medical history of permanent atrial fibrillation not on anticoagulation secondary to GI bleed, known diverticulosis, with elevated 2011 presents with melanotic stools and severe anemia secondary to GI bleed leading to acute kidney injury and also found to have urolithiasis.  Gastroenterology was consulted and recommended conservative management.  He is several units of packed red blood cells seen by urology underwent stent placement, with modest improvement in her acute kidney injury.   Assessment/Plan:   Acute GI bleeding Presumed diverticular GI was consulted recommended conservative management. Hemoglobin now improved. No further episode of bleeding. RBC tagged red studies for recurrent bleeding.  Acute blood loss anemia: Secondary to diverticular bleed, hemoglobin responded appropriately to 2 units of packed red blood cells. Hemoglobin this morning is 9.0 which is slowly trending up.  Nephrolithiasis: Urology was consulted recommended a stent placement 7/24/20222. Recommended to follow-up with Urologist as an outpatient on 01/31/2021. Repeated ultrasound showed persistent bilateral hydronephrosis  Acute kidney injury superimposed on chronic kidney disease stage IIIb: Likely multifactorial in the setting of hypovolemia, acute blood loss anemia, infectious etiology and hypotension.  With new bilateral hydronephrosis and a significantly distended urinary bladder. With a sudden drop in urine output from 1400->300 Worsening renal function renal ultrasound showed bilateral hydronephrosis, discussed this case with the urologist he recommended to place a Foley and repeat renal function in the morning. Will continue care and rate of IV fluids.  New acute metabolic encephalopathy: Of unclear  etiology question of possibly an infectious etiology, urine looks significantly cloudy had a recent instrumentation, with lagging to improve renal function.   Possible UTI: Urine looks significantly cloudy with the recent instrumentation with a lactam and improvement in his renal function. UA showed many white blood cells and many bacteria. Continue IV Rocephin and IV Diflucan urine cultures are pending.  Non-anion gap metabolic acidosis: Acidosis improving with IV fluids, will continue for an additional 24 hours. See acute kidney injury for further details.  BpH: Continue finasteride. He will have to go home with a Foley.  Advanced Alzheimer's dementia with behavioral disturbances: Continue donezepil & Ativan.  Permanent atrial fibrillation: Rate control not on anticoagulation due to GI bleed.  Acute confusional state: Secondarily to acute illness in the setting of underlying dementia, use melatonin at bedtime seroquel as needed.  Hyperlipidemia: At this point in time 87 year old, we will go ahead and discontinue statins  Coronary artery disease: Stable discontinue Lipitor continue aspirin when able to tolerate.    Ethics/goals of care: Will consult hospice and palliative care.  Severe protein caloric malnutrition: Noted  DVT prophylaxis: lovenox Family Communication:daughter Status is: Inpatient  Remains inpatient appropriate because:Hemodynamically unstable  Dispo: The patient is from: SNF              Anticipated d/c is to: SNF              Patient currently is not medically stable to d/c.   Difficult to place patient No        Code Status:     Code Status Orders  (From admission, onward)           Start     Ordered   01/20/21 2239  Do not attempt resuscitation (DNR)  Continuous       Question  Answer Comment  In the event of cardiac or respiratory ARREST Do not call a "code blue"   In the event of cardiac or respiratory ARREST Do not perform  Intubation, CPR, defibrillation or ACLS   In the event of cardiac or respiratory ARREST Use medication by any route, position, wound care, and other measures to relive pain and suffering. May use oxygen, suction and manual treatment of airway obstruction as needed for comfort.      01/20/21 2241           Code Status History     Date Active Date Inactive Code Status Order ID Comments User Context   03/21/2020 1624 03/24/2020 1821 DNR [id number]  Karmen Bongo, MD ED   12/25/2018 2347 12/29/2018 1657 DNR [id number]  Lenore Cordia, MD ED   06/08/2018 0232 06/11/2018 1713 DNR [id number]  Norval Morton, MD ED   06/05/2018 1048 06/07/2018 1420 DNR [id number]  Karmen Bongo, MD ED      Advance Directive Documentation    Flowsheet Row Most Recent Value  Type of Advance Directive Out of facility DNR (pink MOST or yellow form)  Pre-existing out of facility DNR order (yellow form or pink MOST form) Pink MOST/Yellow Form most recent copy in chart - Physician notified to receive inpatient order  "MOST" Form in Place? --         IV Access:   Peripheral IV   Procedures and diagnostic studies:   US RENAL  Result Date: 01/27/2021 CLINICAL DATA:  Acute kidney injury. EXAM: RENAL / URINARY TRACT ULTRASOUND COMPLETE COMPARISON:  CT kidney 01/20/2021. FINDINGS: Right Kidney: Renal measurements: 14.6 x 7.8 x 7.1 cm = volume: 424 mL. Echogenicity within normal limits. No mass. Persistent severe hydroureteronephrosis. No definite nephrolithiasis. Left Kidney: Renal measurements: 14.1 x 7.7 x 5.1 cm = volume: 290 mL. Echogenicity within normal limits. No mass. Multiple calcified stones within the left kidney with the largest measuring up to 1.8 cm and associated moderate to severe hydronephrosis. Urinary bladder: Debris noted within the urinary bladder lumen. There is a 1.8 x 0.5 x 2.7 cm echogenic foci within the urinary bladder lumen. Irregular urinary bladder wall. Other: The prostate is grossly  unremarkable. IMPRESSION: 1. Persistent obstructive left nephrolithiasis measuring up to at least 1.8 cm. Persistent moderate to severe hydronephrosis. Correlate with urinalysis for superimposed infection. 2. Persistent severe right hydroureteronephrosis. 3. Debris within the urinary bladder lumen. Finding may be related to infection. 4. A 1.8 x 0.5 x 2.7 cm echogenic foci that may represent blood products versus infection versus mass/malignancy. Electronically Signed   By: Iven Finn M.D.   On: 01/27/2021 22:30     Medical Consultants:   None.   Subjective:    [name] [name] was mumbling this morning did not allow for physical exam  Objective:    Vitals:   01/27/21 1947 01/27/21 2011 01/27/21 2358 01/28/21 0343  BP: (!) 93/58  (!) 99/6 97/76  Pulse: 72 76 77 65  Resp:   17 17  Temp: 98.9 F (37.2 C)  98.5 F (36.9 C) 98.7 F (37.1 C)  TempSrc: Oral Oral Oral Oral  SpO2: 98%  98% 98%  Weight:      Height:       SpO2: 98 % O2 Flow Rate (L/min): 6 L/min   Intake/Output Summary (Last 24 hours) at 01/28/2021 0820 Last data filed at 01/28/2021 0450 Gross per 24 hour  Intake 1326.61 ml  Output 300 ml  Net 1026.61 ml  Filed Weights   01/21/21 0647 01/22/21 0300 01/27/21 0350  Weight: 63.3 kg 68.2 kg 64.4 kg    Exam: General exam: In no acute distress. Respiratory system: Good air movement and clear to auscultation. Cardiovascular system: S1 & S2 heard, RRR. No JVD. Gastrointestinal system: Abdomen is there is a slight distention in the suprapubic area he grimaces when it spalpated Extremities: No pedal edema. Skin: No rashes, lesions or ulcers   Data Reviewed:    Labs: Basic Metabolic Panel: Recent Labs  Lab 01/22/21 0533 01/23/21 0043 01/24/21 0203 01/25/21 0627 01/26/21 0037 01/28/21 0112  NA 138 139 140 142 140 143  K 4.7 3.6 3.3* 4.0 4.3 3.9  CL 114* 116* 118* 122* 124* 119*  CO2 14* 16* 15* 13* 11* 16*  GLUCOSE 103* 107* 95 89 104* 133*   BUN 109* 96* 78* 70* 67* 73*  CREATININE 3.02* 2.88* 2.92* 3.27* 3.25* 4.49*  CALCIUM 8.6* 8.5* 8.4* 8.7* 8.4* 8.3*  PHOS 3.8  --   --   --   --   --     GFR Estimated Creatinine Clearance: 9.4 mL/min (A) (by C-G formula based on SCr of 4.49 mg/dL (H)). Liver Function Tests: Recent Labs  Lab 01/22/21 0533  ALBUMIN 2.0*    No results for input(s): LIPASE, AMYLASE in the last 168 hours.  No results for input(s): AMMONIA in the last 168 hours. Coagulation profile No results for input(s): INR, PROTIME in the last 168 hours. COVID-19 Labs  No results for input(s): DDIMER, FERRITIN, LDH, CRP in the last 72 hours.  Lab Results  Component Value Date   SARSCOV2NAA NEGATIVE 01/21/2021   [place] NEGATIVE 03/21/2020   [place] NEGATIVE 12/25/2018    CBC: Recent Labs  Lab 01/21/21 1606 01/22/21 0533 01/23/21 0043 01/24/21 0203  WBC 8.8 8.4 9.8 6.8  HGB 6.4* 6.4* 8.9* 9.0*  HCT 20.1* 20.4* 27.3* 27.4*  MCV 98.5 97.1 94.8 96.1  PLT 152 136* 122* 130*    Cardiac Enzymes: No results for input(s): CKTOTAL, CKMB, CKMBINDEX, TROPONINI in the last 168 hours. BNP (last 3 results) No results for input(s): PROBNP in the last 8760 hours. CBG: No results for input(s): GLUCAP in the last 168 hours. D-Dimer: No results for input(s): DDIMER in the last 72 hours. Hgb A1c: No results for input(s): HGBA1C in the last 72 hours. Lipid Profile: No results for input(s): CHOL, HDL, LDLCALC, TRIG, CHOLHDL, LDLDIRECT in the last 72 hours. Thyroid function studies: No results for input(s): TSH, T4TOTAL, T3FREE, THYROIDAB in the last 72 hours.  Invalid input(s): FREET3 Anemia work up: No results for input(s): VITAMINB12, FOLATE, FERRITIN, TIBC, IRON, RETICCTPCT in the last 72 hours. Sepsis Labs: Recent Labs  Lab 01/21/21 1606 01/22/21 0533 01/23/21 0043 01/24/21 0203  WBC 8.8 8.4 9.8 6.8    Microbiology Recent Results (from the past 240 hour(s))  Resp Panel by RT-PCR (Flu  A&B, Covid) Nasopharyngeal Swab     Status: None   Collection Time: 01/21/21  4:30 AM   Specimen: Nasopharyngeal Swab; Nasopharyngeal(NP) swabs in vial transport medium  Result Value Ref Range Status   SARS Coronavirus 2 by RT PCR NEGATIVE NEGATIVE Final    Comment: (NOTE) SARS-CoV-2 target nucleic acids are NOT DETECTED.  The SARS-CoV-2 RNA is generally detectable in upper respiratory specimens during the acute phase of infection. The lowest concentration of SARS-CoV-2 viral copies this assay can detect is 138 copies/mL. A negative result does not preclude SARS-Cov-2 infection and should not be used as  the sole basis for treatment or other patient management decisions. A negative result may occur with  improper specimen collection/handling, submission of specimen other than nasopharyngeal swab, presence of viral mutation(s) within the areas targeted by this assay, and inadequate number of viral copies(<138 copies/mL). A negative result must be combined with clinical observations, patient history, and epidemiological information. The expected result is Negative.  Fact Sheet for Patients:  EntrepreneurPulse.com.au  Fact Sheet for Healthcare Providers:  IncredibleEmployment.be  This test is no t yet approved or cleared by the [place] FDA and  has been authorized for detection and/or diagnosis of SARS-CoV-2 by FDA under an Emergency Use Authorization (EUA). This EUA will remain  in effect (meaning this test can be used) for the duration of the COVID-19 declaration under Section 564(b)(1) of the Act, 21 U.S.C.section 360bbb-3(b)(1), unless the authorization is terminated  or revoked sooner.       Influenza A by PCR NEGATIVE NEGATIVE Final   Influenza B by PCR NEGATIVE NEGATIVE Final    Comment: (NOTE) The Xpert Xpress SARS-CoV-2/FLU/RSV plus assay is intended as an aid in the diagnosis of influenza from Nasopharyngeal swab specimens  and should not be used as a sole basis for treatment. Nasal washings and aspirates are unacceptable for Xpert Xpress SARS-CoV-2/FLU/RSV testing.  Fact Sheet for Patients: EntrepreneurPulse.com.au  Fact Sheet for Healthcare Providers: IncredibleEmployment.be  This test is not yet approved or cleared by the [place] FDA and has been authorized for detection and/or diagnosis of SARS-CoV-2 by FDA under an Emergency Use Authorization (EUA). This EUA will remain in effect (meaning this test can be used) for the duration of the COVID-19 declaration under Section 564(b)(1) of the Act, 21 U.S.C. section 360bbb-3(b)(1), unless the authorization is terminated or revoked.  Performed at [facility], [place] [address], [place], [place] [address]   Urine Culture     Status: Abnormal   Collection Time: 01/21/21 11:05 AM   Specimen: Urine, Clean Catch  Result Value Ref Range Status   Specimen Description URINE, CLEAN CATCH  Final   Special Requests   Final    NONE Performed at [facility], [place] [address], [place], [place] [address]    Culture 20,000 COLONIES/mL YEAST (A)  Final   Report Status 01/22/2021 FINAL  Final  Urine Culture     Status: Abnormal   Collection Time: 01/25/21  9:16 AM   Specimen: Urine, Clean Catch  Result Value Ref Range Status   Specimen Description URINE, CLEAN CATCH  Final   Special Requests   Final    NONE Performed at [facility], [place] [address], [place], [place] [address]    Culture MULTIPLE SPECIES PRESENT, SUGGEST RECOLLECTION (A)  Final   Report Status 01/26/2021 FINAL  Final     Medications:    atorvastatin  40 mg Oral QPM   chlorhexidine  15 mL Mouth Rinse BID   cholecalciferol  2,000 Units Oral QHS   donepezil  5 mg Oral BID   ferrous sulfate  325 mg Oral Daily   finasteride  5 mg Oral QPM   mouth rinse  15 mL Mouth Rinse q12n4p   melatonin  3 mg Oral QHS   pantoprazole  40 mg Oral BID    sertraline  25 mg Oral Daily   Continuous Infusions:  cefTRIAXone (ROCEPHIN)  IV 1 g (01/27/21 0804)   fluconazole (DIFLUCAN) IV 100 mg (01/27/21 0811)   sodium bicarbonate 150 mEq in D5W infusion  75 mL/hr at 01/28/21 0459      LOS: 8 days   Charlynne Cousins  [facility]  01/28/2021, 8:20 AM

## 2021-01-29 DIAGNOSIS — Z515 Encounter for palliative care: Secondary | ICD-10-CM | POA: Diagnosis not present

## 2021-01-29 DIAGNOSIS — K922 Gastrointestinal hemorrhage, unspecified: Secondary | ICD-10-CM | POA: Diagnosis not present

## 2021-01-29 MED ORDER — MORPHINE SULFATE (PF) 2 MG/ML IV SOLN: 2.0000 mg | INTRAVENOUS | 0 refills | Status: AC | PRN

## 2021-01-29 MED ORDER — LORAZEPAM 2 MG/ML IJ SOLN: 1.0000 mg | INTRAMUSCULAR | 0 refills | Status: AC | PRN

## 2021-01-29 NOTE — Discharge Summary (Signed)
Physician Discharge Summary  [name] [name] HYI:[id number] DOB: 09-05-1925 DOA: 01/20/2021  PCP: [name], MD  Admit date: 01/20/2021 Discharge date: 01/29/2021  Admitted From: Home Disposition:  esidential hospice facility  Recommendations for Outpatient Follow-up:   Discharge Condition:Hospice CODE STATUS:DNR Diet recommendation: Heart Healthy   Brief/Interim Summary: 87 y.o. male past medical history of permanent atrial fibrillation not on anticoagulation secondary to GI bleed, known diverticulosis, with elevated 2011 presents with melanotic stools and severe anemia secondary to GI bleed leading to acute kidney injury and also found to have urolithiasis.  Gastroenterology was consulted and recommended conservative management.  He is several units of packed red blood cells seen by urology underwent stent placement, with modest improvement in her acute kidney injury.  Discharge Diagnoses:  Principal Problem:   Acute GI bleeding Active Problems:   Permanent atrial fibrillation (HCC)   Coronary artery disease involving native coronary artery of native heart without angina pectoris   Alzheimer's disease ([place])   Hypercholesterolemia   ABLA (acute blood loss anemia)   BPH (benign prostatic hyperplasia)   Acute lower UTI   Acute kidney injury superimposed on CKD stage IIIb (HCC)   Renal stone   Aortic atherosclerosis (HCC)   Delirium  Acute GI bleed: Presumed diverticular in nature, GI was consulted who agreed and recommended conservative management he will not transfuse hemoglobin slowly improving he had no further episode of bleeding.  Acute blood loss anemia: Likely due to #1 his hemoglobin responded appropriately with 2 units of packed red blood cells.  Nephrolithiasis: Urology was consulted recommended a stent placement which was done on 01/22/2021, repeated ultrasound showed bilateral hydronephrosis with distended bladder see below for further details.  Acute kidney  injury superimposed on chronic kidney disease stage IIIb: Likely multifactorial in the setting of hypovolemia acute blood loss infectious etiology and bladder outlet obstruction. Renal ultrasound was done that showed bilateral hydronephrosis with a distended bladder, Foley was inserted and he put out about 1500 cc of purulent thick material. He became severely hypotensive despite being on IV Rocephin after speaking with the family decided to move towards comfort care. All medications were stopped except for morphine and Ativan and the family decided to transfer the patient to residential hospice facility.  New acute metabolic encephalopathy: Likely due to infectious etiology in the setting of his advanced dementia.  Possible UTI: He was treated with a course of IV Rocephin and Diflucan urine cultures were inconclusive. After family decided to move towards comfort care these were stopped.  Non-anion gap metabolic acidosis: Reviewed due to acute kidney failure.  BPH: Finasteride was discontinued.  Advanced Alzheimer's dementia with behavioral disturbances: Noted.  Paroxysmal atrial fibrillation: Noted.  Hyperlipidemia:  Coronary artery disease:  Ethics/goals of care: After the patient started getting septic and worsening acute renal failure we had a long discussion with the family and they decided to move towards comfort care, they wanted all other medications not related to comfort discontinued and all lab draws and vital stopped. Hospice palliative care was consulted and he will be transferred to a residential hospice facility.  Discharge Instructions  Discharge Instructions     Diet - low sodium heart healthy   Complete by: As directed    Increase activity slowly   Complete by: As directed    No wound care   Complete by: As directed       Allergies as of 01/29/2021       Reactions   Amiodarone Other (See Comments)  Unknown reaction (Had a severe lung infection in  2003)   Antihistamines, Diphenhydramine-type Other (See Comments)   Jittery   Lovastatin Nausea And Vomiting        Medication List     STOP taking these medications    acetaminophen 500 MG tablet Commonly known as: TYLENOL   aspirin EC 81 MG tablet   atorvastatin 40 MG tablet Commonly known as: LIPITOR   donepezil 5 MG tablet Commonly known as: ARICEPT   ferrous sulfate 325 (65 FE) MG EC tablet   finasteride 5 MG tablet Commonly known as: PROSCAR   KAOPECTATE PO   lactose free nutrition Liqd   multivitamin with minerals Tabs tablet   polyethylene glycol 17 g packet Commonly known as: MIRALAX / GLYCOLAX   sertraline 25 MG tablet Commonly known as: ZOLOFT   Vitamin D 50 MCG (2000 UT) Caps       TAKE these medications    LORazepam 2 MG/ML injection Commonly known as: ATIVAN Inject 0.5 mLs (1 mg total) into the vein every 4 (four) hours as needed for anxiety or sedation (agitation).   morphine 2 MG/ML injection Inject 1 mL (2 mg total) into the vein every 2 (two) hours as needed (sob or pain).        Allergies  Allergen Reactions   Amiodarone Other (See Comments)    Unknown reaction (Had a severe lung infection in 2003)   Antihistamines, Diphenhydramine-Type Other (See Comments)    Jittery   Lovastatin Nausea And Vomiting    Consultations: Urology   Procedures/Studies: DG Retrograde Pyelogram  Result Date: 01/23/2021 CLINICAL DATA:  Left ureteral stent placement.  Left nephrolithiasis EXAM: RETROGRADE PYELOGRAM COMPARISON:  Renal stone protocol CT 01/20/2021 FINDINGS: Two intraoperative fluoroscopic images were submitted for interpretation. These images demonstrate opacification of the distal left ureter. Rounded density in the left renal pelvis may be related to contrast or known calculus. Ureteral stent noted in the region of the left kidney. IMPRESSION: Intraoperative fluoroscopic images of retrograde pyelogram as above. Electronically Signed    By: Miachel Roux M.D.   On: 01/23/2021 07:33   US RENAL  Result Date: 01/27/2021 CLINICAL DATA:  Acute kidney injury. EXAM: RENAL / URINARY TRACT ULTRASOUND COMPLETE COMPARISON:  CT kidney 01/20/2021. FINDINGS: Right Kidney: Renal measurements: 14.6 x 7.8 x 7.1 cm = volume: 424 mL. Echogenicity within normal limits. No mass. Persistent severe hydroureteronephrosis. No definite nephrolithiasis. Left Kidney: Renal measurements: 14.1 x 7.7 x 5.1 cm = volume: 290 mL. Echogenicity within normal limits. No mass. Multiple calcified stones within the left kidney with the largest measuring up to 1.8 cm and associated moderate to severe hydronephrosis. Urinary bladder: Debris noted within the urinary bladder lumen. There is a 1.8 x 0.5 x 2.7 cm echogenic foci within the urinary bladder lumen. Irregular urinary bladder wall. Other: The prostate is grossly unremarkable. IMPRESSION: 1. Persistent obstructive left nephrolithiasis measuring up to at least 1.8 cm. Persistent moderate to severe hydronephrosis. Correlate with urinalysis for superimposed infection. 2. Persistent severe right hydroureteronephrosis. 3. Debris within the urinary bladder lumen. Finding may be related to infection. 4. A 1.8 x 0.5 x 2.7 cm echogenic foci that may represent blood products versus infection versus mass/malignancy. Electronically Signed   By: Iven Finn M.D.   On: 01/27/2021 22:30   CT RENAL STONE STUDY  Result Date: 01/20/2021 CLINICAL DATA:  87 year old male with epigastric pain and anemia. Blood in stool. Evaluate for kidney stone. EXAM: CT ABDOMEN AND PELVIS WITHOUT CONTRAST  TECHNIQUE: Multidetector CT imaging of the abdomen and pelvis was performed following the standard protocol without IV contrast. COMPARISON:  CT abdomen pelvis dated 06/05/2018. FINDINGS: Evaluation of this exam is limited in the absence of intravenous contrast. Lower chest: There is emphysematous changes of the lungs. There is advanced 3 vessel coronary  vascular calcification. Partially visualized pacemaker wire. No intra-abdominal free air or free fluid. Hepatobiliary: The liver is unremarkable. No intrahepatic biliary dilatation. The gallbladder is unremarkable. Pancreas: Unremarkable. No pancreatic ductal dilatation or surrounding inflammatory changes. Spleen: Normal in size without focal abnormality. Adrenals/Urinary Tract: The adrenal glands unremarkable there is a large stone or clusters of stone in the left renal pelvis extending to the ureteropelvic junction measuring up to 2.4 cm length. There is moderate left hydronephrosis. Additional nonobstructing left renal inferior pole calculi noted. There is moderate left renal parenchyma atrophy. Small hypodense lesion from the superior pole the left kidney is not characterized. There is mild right renal parenchyma atrophy with mild right hydronephrosis. There is mild right hydroureter. No stone identified in the right kidney or in the visualized right ureter. There is diffuse trabeculated bladder wall likely related to chronic bladder outlet obstruction. Diffusely thickened bladder wall with perivesical stranding. Correlation with urinalysis recommended to evaluate for cystitis. Stomach/Bowel: There is severe sigmoid diverticulosis without active inflammatory changes. There is no bowel obstruction. Appendectomy. Vascular/Lymphatic: Advanced aortoiliac atherosclerotic disease. The IVC is unremarkable. No portal venous gas. There is no adenopathy. Reproductive: The prostate gland is not well visualized Other: None Musculoskeletal: Severe osteopenia and degenerative changes. Bilateral femoral ORIF. No acute osseous pathology. IMPRESSION: 1. Large stone or clusters of stone in the left renal pelvis extending to the ureteropelvic junction with moderate left hydronephrosis. Additional nonobstructing left renal inferior pole calculi noted. 2. Thickened bladder wall with perivesical stranding. Correlation with urinalysis  recommended to evaluate for cystitis. 3. Severe sigmoid diverticulosis. No bowel obstruction. 4. Aortic Atherosclerosis (ICD10-I70.0). Electronically Signed   By: Anner Crete M.D.   On: 01/20/2021 23:44    Subjective: Sleepy  Discharge Exam: Vitals:   01/28/21 1936 01/29/21 0801  BP: (!) 95/58 (!) 91/54  Pulse: 60 85  Resp: 13 17  Temp: 97.9 F (36.6 C) 97.7 F (36.5 C)  SpO2: 100%    Vitals:   01/28/21 1330 01/28/21 1936 01/29/21 0329 01/29/21 0801  BP:  (!) 95/58  (!) 91/54  Pulse:  60  85  Resp:  13  17  Temp: 98 F (36.7 C) 97.9 F (36.6 C)  97.7 F (36.5 C)  TempSrc:  Axillary Oral Axillary  SpO2:  100%    Weight:      Height:        General: Pt is alert, awake, not in acute distress Cardiovascular: RRR, S1/S2 +, no rubs, no gallops Respiratory: CTA bilaterally, no wheezing, no rhonchi Abdominal: Soft, NT, ND, bowel sounds + Extremities: no edema, no cyanosis    The results of significant diagnostics from this hospitalization (including imaging, microbiology, ancillary and laboratory) are listed below for reference.     Microbiology: Recent Results (from the past 240 hour(s))  Resp Panel by RT-PCR (Flu A&B, Covid) Nasopharyngeal Swab     Status: None   Collection Time: 01/21/21  4:30 AM   Specimen: Nasopharyngeal Swab; Nasopharyngeal(NP) swabs in vial transport medium  Result Value Ref Range Status   SARS Coronavirus 2 by RT PCR NEGATIVE NEGATIVE Final    Comment: (NOTE) SARS-CoV-2 target nucleic acids are NOT DETECTED.  The SARS-CoV-2  RNA is generally detectable in upper respiratory specimens during the acute phase of infection. The lowest concentration of SARS-CoV-2 viral copies this assay can detect is 138 copies/mL. A negative result does not preclude SARS-Cov-2 infection and should not be used as the sole basis for treatment or other patient management decisions. A negative result may occur with  improper specimen collection/handling,  submission of specimen other than nasopharyngeal swab, presence of viral mutation(s) within the areas targeted by this assay, and inadequate number of viral copies(<138 copies/mL). A negative result must be combined with clinical observations, patient history, and epidemiological information. The expected result is Negative.  Fact Sheet for Patients:  EntrepreneurPulse.com.au  Fact Sheet for Healthcare Providers:  IncredibleEmployment.be  This test is no t yet approved or cleared by the [place] FDA and  has been authorized for detection and/or diagnosis of SARS-CoV-2 by FDA under an Emergency Use Authorization (EUA). This EUA will remain  in effect (meaning this test can be used) for the duration of the COVID-19 declaration under Section 564(b)(1) of the Act, 21 U.S.C.section 360bbb-3(b)(1), unless the authorization is terminated  or revoked sooner.       Influenza A by PCR NEGATIVE NEGATIVE Final   Influenza B by PCR NEGATIVE NEGATIVE Final    Comment: (NOTE) The Xpert Xpress SARS-CoV-2/FLU/RSV plus assay is intended as an aid in the diagnosis of influenza from Nasopharyngeal swab specimens and should not be used as a sole basis for treatment. Nasal washings and aspirates are unacceptable for Xpert Xpress SARS-CoV-2/FLU/RSV testing.  Fact Sheet for Patients: EntrepreneurPulse.com.au  Fact Sheet for Healthcare Providers: IncredibleEmployment.be  This test is not yet approved or cleared by the [place] FDA and has been authorized for detection and/or diagnosis of SARS-CoV-2 by FDA under an Emergency Use Authorization (EUA). This EUA will remain in effect (meaning this test can be used) for the duration of the COVID-19 declaration under Section 564(b)(1) of the Act, 21 U.S.C. section 360bbb-3(b)(1), unless the authorization is terminated or revoked.  Performed at [facility], [place] [address] [place], [place] [address]   Urine Culture     Status: Abnormal   Collection Time: 01/21/21 11:05 AM   Specimen: Urine, Clean Catch  Result Value Ref Range Status   Specimen Description URINE, CLEAN CATCH  Final   Special Requests   Final    NONE Performed at [facility], [place] [address], [place], [place] [address]    Culture 20,000 COLONIES/mL YEAST (A)  Final   Report Status 01/22/2021 FINAL  Final  Urine Culture     Status: Abnormal   Collection Time: 01/25/21  9:16 AM   Specimen: Urine, Clean Catch  Result Value Ref Range Status   Specimen Description URINE, CLEAN CATCH  Final   Special Requests   Final    NONE Performed at [facility], [place] [address], [place], [place] [address]    Culture MULTIPLE SPECIES PRESENT, SUGGEST RECOLLECTION (A)  Final   Report Status 01/26/2021 FINAL  Final     Labs: BNP (last 3 results) No results for input(s): BNP in the last 8760 hours. Basic Metabolic Panel: Recent Labs  Lab 01/24/21 0203 01/25/21 0627 01/26/21 0037 01/28/21 0112 01/28/21 1249  NA 140 142 140 143 142  K 3.3* 4.0 4.3 3.9 3.4*  CL 118* 122* 124* 119* 117*  CO2 15* 13* 11* 16* 18*  GLUCOSE 95 89 104* 133* 141*  BUN 78* 70* 67* 73* 74*  CREATININE 2.92* 3.27* 3.25* 4.49* 4.67*  CALCIUM 8.4* 8.7* 8.4* 8.3* 8.1*   Liver Function Tests: No results for input(s): AST, ALT, ALKPHOS, BILITOT, PROT, ALBUMIN in the last 168 hours. No results for input(s): LIPASE, AMYLASE in the last 168 hours. No results for input(s): AMMONIA in the last 168 hours. CBC: Recent Labs  Lab 01/23/21 0043 01/24/21 0203 01/28/21 0930  WBC 9.8 6.8 22.2*  NEUTROABS  --   --  18.1*  HGB 8.9* 9.0* 9.0*  HCT 27.3* 27.4* 27.5*  MCV 94.8 96.1 96.8  PLT 122* 130* 216   Cardiac Enzymes: No results for input(s): CKTOTAL, CKMB, CKMBINDEX, TROPONINI in the last 168 hours. BNP: Invalid input(s): POCBNP CBG: No results for input(s): GLUCAP in the last 168 hours. D-Dimer No  results for input(s): DDIMER in the last 72 hours. Hgb A1c No results for input(s): HGBA1C in the last 72 hours. Lipid Profile No results for input(s): CHOL, HDL, LDLCALC, TRIG, CHOLHDL, LDLDIRECT in the last 72 hours. Thyroid function studies No results for input(s): TSH, T4TOTAL, T3FREE, THYROIDAB in the last 72 hours.  Invalid input(s): FREET3 Anemia work up No results for input(s): VITAMINB12, FOLATE, FERRITIN, TIBC, IRON, RETICCTPCT in the last 72 hours. Urinalysis    Component Value Date/Time   COLORURINE YELLOW 01/25/2021 1043   APPEARANCEUR TURBID (A) 01/25/2021 1043   LABSPEC 1.009 01/25/2021 1043   PHURINE 5.0 01/25/2021 1043   GLUCOSEU NEGATIVE 01/25/2021 1043   HGBUR MODERATE (A) 01/25/2021 1043   BILIRUBINUR NEGATIVE 01/25/2021 1043   KETONESUR NEGATIVE 01/25/2021 1043   PROTEINUR 100 (A) 01/25/2021 1043   UROBILINOGEN 0.2 11/21/2009 1617   NITRITE NEGATIVE 01/25/2021 1043   LEUKOCYTESUR LARGE (A) 01/25/2021 1043   Sepsis Labs Invalid input(s): PROCALCITONIN,  WBC,  LACTICIDVEN Microbiology Recent Results (from the past 240 hour(s))  Resp Panel by RT-PCR (Flu A&B, Covid) Nasopharyngeal Swab     Status: None   Collection Time: 01/21/21  4:30 AM   Specimen: Nasopharyngeal Swab; Nasopharyngeal(NP) swabs in vial transport medium  Result Value Ref Range Status   SARS Coronavirus 2 by RT PCR NEGATIVE NEGATIVE Final    Comment: (NOTE) SARS-CoV-2 target nucleic acids are NOT DETECTED.  The SARS-CoV-2 RNA is generally detectable in upper respiratory specimens during the acute phase of infection. The lowest concentration of SARS-CoV-2 viral copies this assay can detect is 138 copies/mL. A negative result does not preclude SARS-Cov-2 infection and should not be used as the sole basis for treatment or other patient management decisions. A negative result may occur with  improper specimen collection/handling, submission of specimen other than nasopharyngeal swab,  presence of viral mutation(s) within the areas targeted by this assay, and inadequate number of viral copies(<138 copies/mL). A negative result must be combined with clinical observations, patient history, and epidemiological information. The expected result is Negative.  Fact Sheet for Patients:  EntrepreneurPulse.com.au  Fact Sheet for Healthcare Providers:  IncredibleEmployment.be  This test is no t yet approved or cleared by the [place] FDA and  has been authorized for detection and/or diagnosis of SARS-CoV-2 by FDA under an Emergency Use Authorization (EUA). This EUA will remain  in effect (meaning this test can be used) for the duration of the COVID-19 declaration under Section 564(b)(1) of the Act, 21 U.S.C.section 360bbb-3(b)(1), unless the authorization is terminated  or revoked sooner.       Influenza A by PCR NEGATIVE NEGATIVE Final   Influenza B by PCR NEGATIVE NEGATIVE Final    Comment: (NOTE)  The Xpert Xpress SARS-CoV-2/FLU/RSV plus assay is intended as an aid in the diagnosis of influenza from Nasopharyngeal swab specimens and should not be used as a sole basis for treatment. Nasal washings and aspirates are unacceptable for Xpert Xpress SARS-CoV-2/FLU/RSV testing.  Fact Sheet for Patients: EntrepreneurPulse.com.au  Fact Sheet for Healthcare Providers: IncredibleEmployment.be  This test is not yet approved or cleared by the [place] FDA and has been authorized for detection and/or diagnosis of SARS-CoV-2 by FDA under an Emergency Use Authorization (EUA). This EUA will remain in effect (meaning this test can be used) for the duration of the COVID-19 declaration under Section 564(b)(1) of the Act, 21 U.S.C. section 360bbb-3(b)(1), unless the authorization is terminated or revoked.  Performed at [facility], [place] [address] [place], [place] [address]   Urine Culture      Status: Abnormal   Collection Time: 01/21/21 11:05 AM   Specimen: Urine, Clean Catch  Result Value Ref Range Status   Specimen Description URINE, CLEAN CATCH  Final   Special Requests   Final    NONE Performed at [facility], [place] [address] [place], [place] [address]    Culture 20,000 COLONIES/mL YEAST (A)  Final   Report Status 01/22/2021 FINAL  Final  Urine Culture     Status: Abnormal   Collection Time: 01/25/21  9:16 AM   Specimen: Urine, Clean Catch  Result Value Ref Range Status   Specimen Description URINE, CLEAN CATCH  Final   Special Requests   Final    NONE Performed at [facility], [place] [address], [place], [place] [address]    Culture MULTIPLE SPECIES PRESENT, SUGGEST RECOLLECTION (A)  Final   Report Status 01/26/2021 FINAL  Final     SIGNED:   Charlynne Cousins, MD  [facility] 01/29/2021, 8:49 AM Pager   If 7PM-7AM, please contact night-coverage [email or web address] Password [email or web address]

## 2021-01-29 NOTE — Progress Notes (Signed)
Pt discharged to [facility] via [facility]. Telemetry and IV removed. Paperwork sent with PTAR. Report called to Helene Kelp, [place].   Tadashi Burkel A Sabastion Hrdlicka

## 2021-01-29 NOTE — Progress Notes (Signed)
Patient ID: [name] [name], male   DOB: 28-Apr-1926, 87 y.o.   MRN: [id number]  I was called by Dr. Aileen Fass on 7/30 regarding persistent hydro on renal US done for a continued rise in the Cr.   He was also noted to have a distended bladder with layered debris and I recommended a foley catheter.  This was placed and is draining well but the Cr continues to rise and is up to 4.97.   I spoke to the daughter who was in the room and she informed me that they have elected to go forward with comfort care at this time.  I was going to recommended continued observation anyway since he is a very poor candidate for further intervention.   BP (!) 95/58 (BP Location: Left Arm)   Pulse 60   Temp 97.9 F (36.6 C) (Axillary)   Resp 13   Ht 6' (1.829 m)   Wt 64.4 kg   SpO2 100%   BMI 19.26 kg/m   Foley bag has several hundred ml of urine with some debris in the collection graduate.   Results for orders placed or performed during the hospital encounter of 01/20/21 (from the past 24 hour(s))  CBC with Differential/Platelet     Status: Abnormal   Collection Time: 01/28/21  9:30 AM  Result Value Ref Range   WBC 22.2 (H) 4.0 - 10.5 K/uL   RBC 2.84 (L) 4.22 - 5.81 MIL/uL   Hemoglobin 9.0 (L) 13.0 - 17.0 g/dL   HCT 27.5 (L) 39.0 - 52.0 %   MCV 96.8 80.0 - 100.0 fL   MCH 31.7 26.0 - 34.0 pg   MCHC 32.7 30.0 - 36.0 g/dL   RDW 17.6 (H) 11.5 - 15.5 %   Platelets 216 150 - 400 K/uL   nRBC 0.1 0.0 - 0.2 %   Neutrophils Relative % 81 %   Neutro Abs 18.1 (H) 1.7 - 7.7 K/uL   Lymphocytes Relative 5 %   Lymphs Abs 1.1 0.7 - 4.0 K/uL   Monocytes Relative 12 %   Monocytes Absolute 2.6 (H) 0.1 - 1.0 K/uL   Eosinophils Relative 1 %   Eosinophils Absolute 0.1 0.0 - 0.5 K/uL   Basophils Relative 0 %   Basophils Absolute 0.0 0.0 - 0.1 K/uL   Immature Granulocytes 1 %   Abs Immature Granulocytes 0.20 (H) 0.00 - 0.07 K/uL  Basic metabolic panel     Status: Abnormal   Collection Time: 01/28/21 12:49 PM  Result  Value Ref Range   Sodium 142 135 - 145 mmol/L   Potassium 3.4 (L) 3.5 - 5.1 mmol/L   Chloride 117 (H) 98 - 111 mmol/L   CO2 18 (L) 22 - 32 mmol/L   Glucose, Bld 141 (H) 70 - 99 mg/dL   BUN 74 (H) 8 - 23 mg/dL   Creatinine, Ser 4.67 (H) 0.61 - 1.24 mg/dL   Calcium 8.1 (L) 8.9 - 10.3 mg/dL   GFR, Estimated 11 (L) >60 mL/min   Anion gap 7 5 - 15     Imp: AKI with bilateral hydro and retention with a further increase in the Cr despite bladder decompression.   Rec: Continue foley drainage.

## 2021-01-29 NOTE — Progress Notes (Signed)
   Palliative Medicine Inpatient Follow Up Note  Consulting Provider: Charlynne Cousins, MD   Reason for consult:  "End of life care"   HPI:  Per intake H&P -->Delaine G. Siska is a 87 y.o. M woth a h/o atrial fibrillation, known diverticulosis, Alzheimer's disease, AAA, bilateral hip fractures. He presented on 7/22 with 24 hours of diarrhea and dark stools. Admitted for severe anemia secondary to GIB, AKI and also found to have ureterolithiasis. Seen by GI with recommendation for conservative management. Seen by urology and underwent stent placement.    Palliative care has been asked t get involved as it appears Montoya was prior enrolled in Palliative care services through [employer] [facility] [employer] Asked to continue with [place] - End of life conversations.  Today's Discussion (01/29/2021):  *Please note that this is a verbal dictation therefore any spelling or grammatical errors are due to the "[employer]" system interpretation.  Chart reviewed. Ahmod appears to have appropriate symptom control this morning. His VS have stabilized to the point whereby transfer to [facility] is appropriate. Patients family updated at bedside.Questions and concerns addressed.  Objective Assessment: Vital Signs Vitals:   01/28/21 1936 01/29/21 0801  BP: (!) 95/58 (!) 91/54  Pulse: 60 85  Resp: 13 17  Temp: 97.9 F (36.6 C) 97.7 F (36.5 C)  SpO2: 100%    No intake or output data in the 24 hours ending 01/29/21 1214 Last Weight  Most recent update: 01/27/2021  3:52 AM    Weight  64.4 kg (141 lb 15.6 oz)            Gen: Frail Elderly caucasian M in NAD HEENT: Dry mucous membranes CV: Regular rate and irregular rhythm PULM: 2LPM [place] ABD: soft/nontender EXT: No edema Neuro: Alert and oriented x1   SUMMARY OF RECOMMENDATIONS   DNAR/DNI  Comfort Care - Medication per [facility]  Unrestricted visitation  Plan for transfer to [facility] this afternoon  Time Spent: 15 Greater than 50% of the time  was spent in counseling and coordination of care ______________________________________________________________________________________ [place] Team Team Cell Phone: [phone] Please utilize secure chat with additional questions, if there is no response within 30 minutes please call the above phone number  Palliative Medicine Team providers are available by phone from 7am to 7pm daily and can be reached through the team cell phone.  Should this patient require assistance outside of these hours, please call the patient's attending physician.

## 2021-01-29 NOTE — Progress Notes (Addendum)
Manufacturing engineer [facility]) Hospital Liaison note.    Addendum:  Consents are complete at this time.  Please arrange transport and call report to [phone].   Chart reviewed and eligibility confirmed for [facility]. Spoke with family to confirm interest and explain services. Family agreeable to transfer today. TOC aware.    Mrs. Haji has asked to do the consents in person.  This is being arranged at this time. [facility] will notify TOC when registration paperwork has been completed to arrange transport.   RN please call report to [phone].  Thank you for the opportunity to participate in this patient's care.  Domenic Moras, BSN, RN [facility] Liaison (listed on [place] under [facility])    [phone] (24h on call)

## 2021-01-29 NOTE — TOC Transition Note (Addendum)
Transition of Care [facility]) - CM/SW Discharge Note   Patient Details  Name: [name] [name] MRN: [id number] Date of Birth: Nov 30, 1925  Transition of Care [facility]) CM/SW Contact:  Bary Castilla, LCSW Phone Number: 01/29/2021, 11:59 AM   Clinical Narrative:     Patient will DC to:?[facility] Anticipated DC date:?01/29/2021 Family notified:?[name] Transport by: Corey Harold   Per MD patient ready for DC to [facility]. RN, patient, patient's family, and facility notified of DC. Discharge Summary sent to facility. RN given number for report  [phone].. DC packet on chart. Ambulance transport requested for patient.   CSW signing off.   [place], [place] [phone]   Final next level of care: [place] Barriers to Discharge: Barriers Resolved   Patient Goals and CMS Choice        Discharge Placement              Patient chooses bed at:  [facility]) Patient to be transferred to facility by: [place] Name of family member notified: [name] Patient and family notified of of transfer: 01/29/21  Discharge Plan and Services In-house Referral: Clinical Social Work                                   Social Determinants of Health ([place]) Interventions     Readmission Risk Interventions No flowsheet data found.

## 2021-02-08 ENCOUNTER — Telehealth: Payer: Self-pay | Admitting: Neurology

## 2021-02-08 NOTE — Telephone Encounter (Signed)
Pt's wife called to inform us that pt passed away last week.

## 2021-02-08 NOTE — Telephone Encounter (Signed)
Noted, we will send sympathy card to wife

## 2021-02-17 ENCOUNTER — Encounter: Payer: Medicare Other | Admitting: Cardiovascular Disease

## 2021-02-27 ENCOUNTER — Ambulatory Visit: Payer: Medicare Other | Admitting: Neurology

## 2021-03-02 DEATH — deceased

## 2021-03-08 ENCOUNTER — Ambulatory Visit: Payer: Medicare Other | Admitting: Podiatry

## 2021-09-20 IMAGING — CR DG FEMUR 2+V*R*
4 series · 4 of 4 positions shown · non-contrast
Comparison: Right hip x-rays dated April 26, 2020.

CLINICAL DATA: Right hip pain after fall.

EXAM:
RIGHT FEMUR 2 VIEWS; PELVIS - 1-2 VIEW

[t femur with hip  ap right]
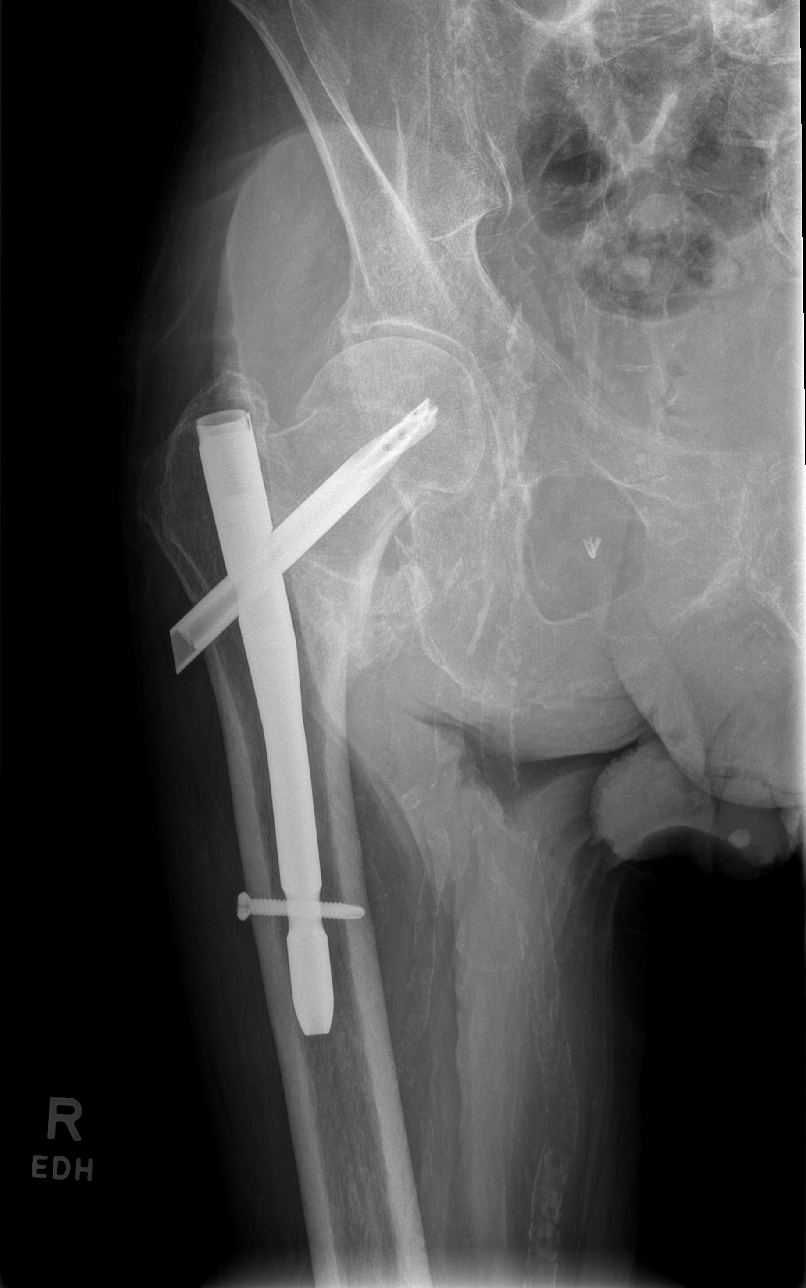

[t femur with knee ap right]
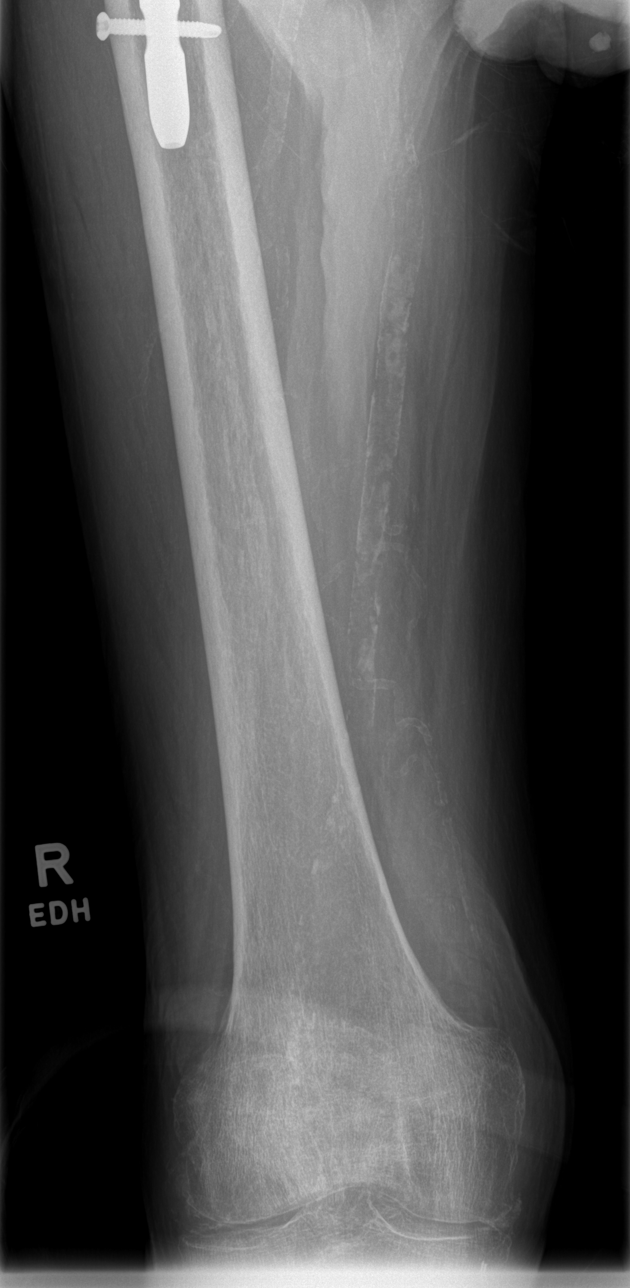

[t femur with hip lat right]
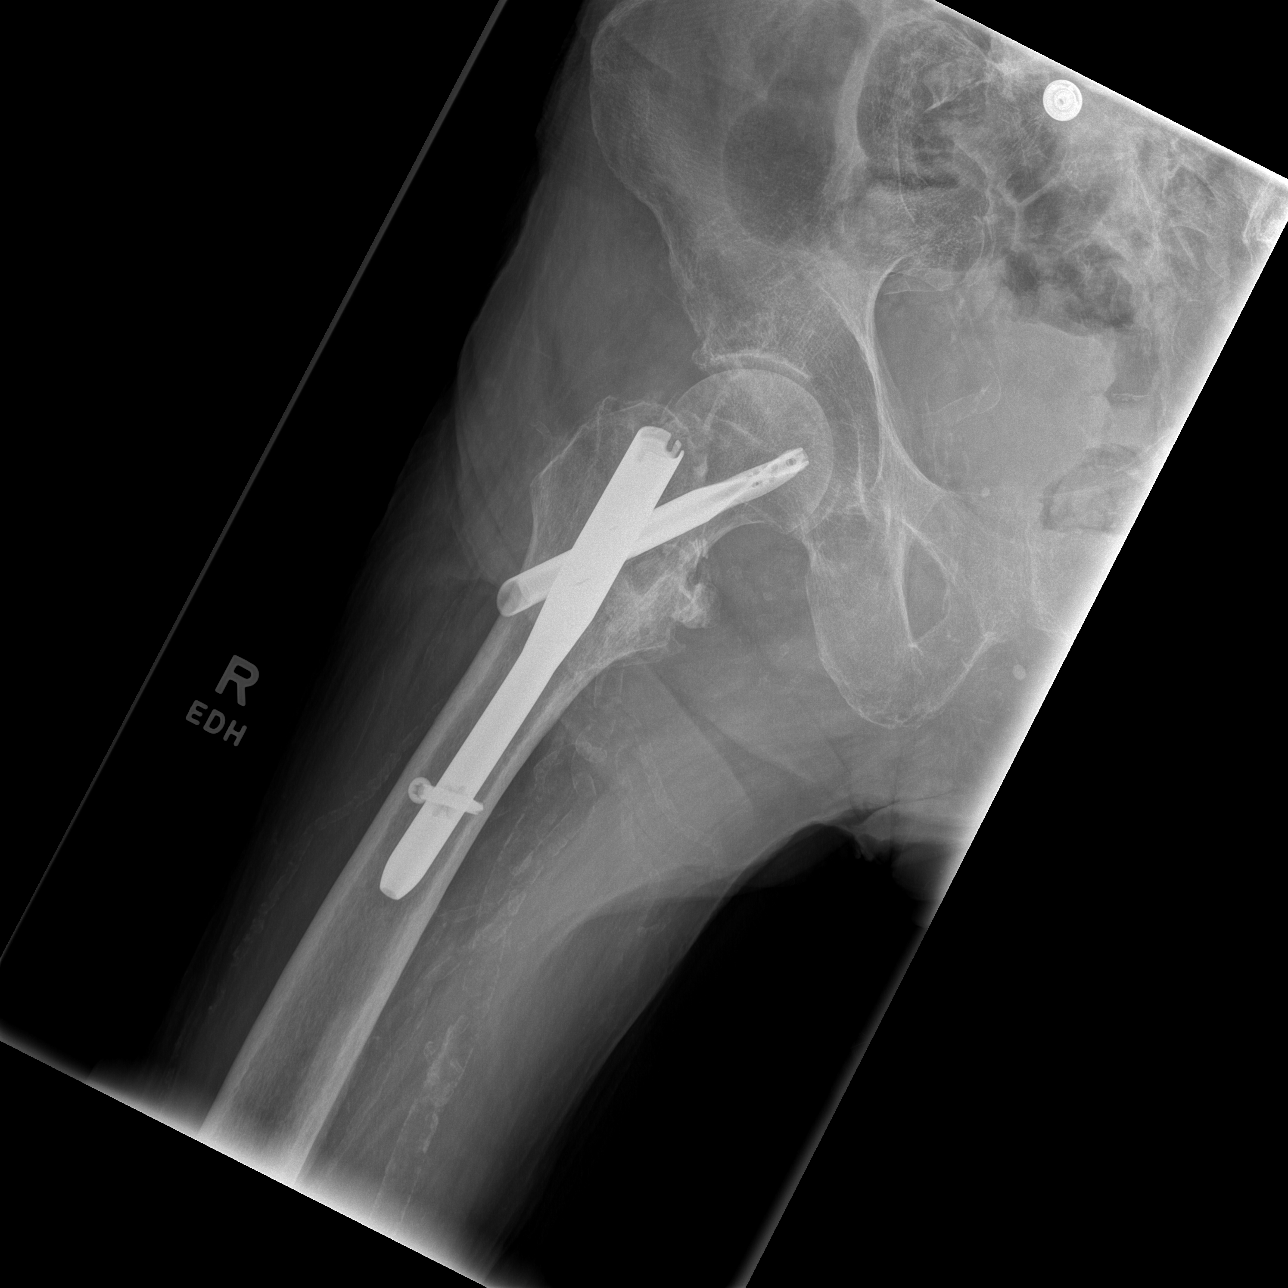

[t femur with knee lat right]
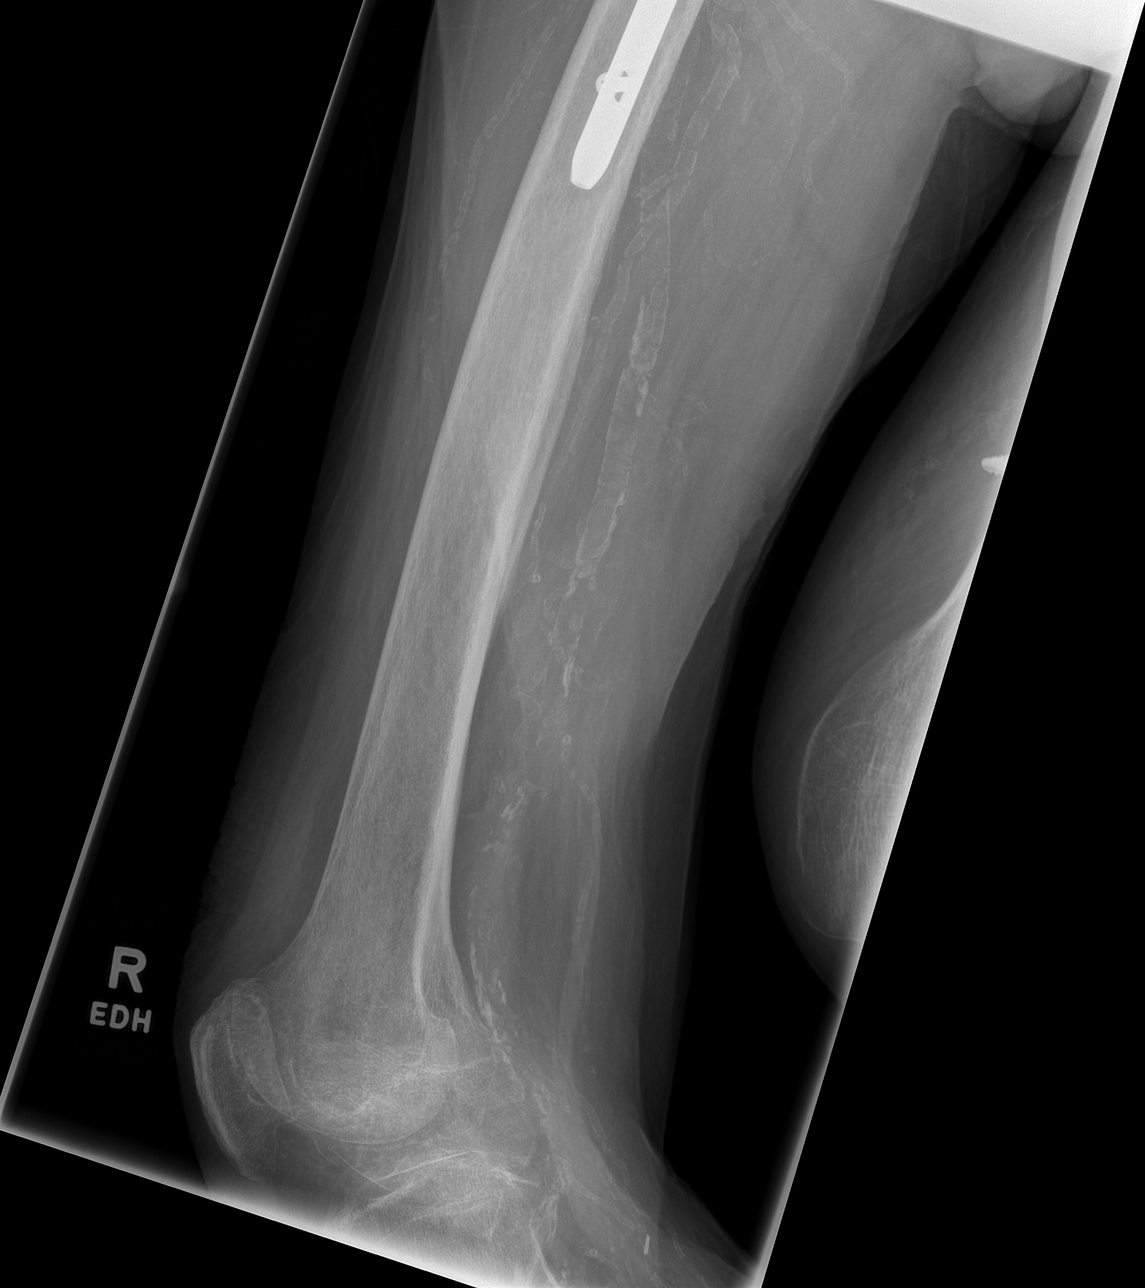

[4 of 4 positions shown; findings below may reference images not displayed]

FINDINGS: New subtle irregularity of the right subcapital femoral neck,
suspicious for acute minimally impacted fracture. No dislocation.
Healed right intertrochanteric fracture status post gamma nail
fixation. Prior left femur ORIF. No evidence of hardware failure or
loosening. Osteopenia. Atherosclerotic vascular calcifications.
IMPRESSION: 1. New subtle irregularity of the right subcapital femoral neck,
suspicious for acute minimally impacted fracture.
